# Patient Record
Sex: Female | Born: 1945 | Race: White | Hispanic: No | Marital: Married | Smoking: Never smoker
Health system: Southern US, Community
[De-identification: ages and names within clinical notes are randomized; demographics above are authoritative.]

## PROBLEM LIST (undated history)

## (undated) DIAGNOSIS — B019 Varicella without complication: Secondary | ICD-10-CM

## (undated) DIAGNOSIS — E785 Hyperlipidemia, unspecified: Secondary | ICD-10-CM

## (undated) DIAGNOSIS — R7303 Prediabetes: Secondary | ICD-10-CM

## (undated) DIAGNOSIS — H3530 Unspecified macular degeneration: Secondary | ICD-10-CM

## (undated) DIAGNOSIS — A630 Anogenital (venereal) warts: Secondary | ICD-10-CM

## (undated) DIAGNOSIS — F419 Anxiety disorder, unspecified: Secondary | ICD-10-CM

## (undated) DIAGNOSIS — R42 Dizziness and giddiness: Secondary | ICD-10-CM

## (undated) DIAGNOSIS — M797 Fibromyalgia: Secondary | ICD-10-CM

## (undated) DIAGNOSIS — I679 Cerebrovascular disease, unspecified: Secondary | ICD-10-CM

## (undated) DIAGNOSIS — Z800 Family history of malignant neoplasm of digestive organs: Secondary | ICD-10-CM

## (undated) HISTORY — DX: Anogenital (venereal) warts: A63.0

## (undated) HISTORY — PX: COLONOSCOPY: SHX174

## (undated) HISTORY — DX: Unspecified macular degeneration: H35.30

## (undated) HISTORY — DX: Varicella without complication: B01.9

## (undated) HISTORY — DX: Fibromyalgia: M79.7

## (undated) HISTORY — DX: Hyperlipidemia, unspecified: E78.5

## (undated) HISTORY — DX: Family history of malignant neoplasm of digestive organs: Z80.0

---
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
## 1977-07-02 HISTORY — PX: APPENDECTOMY: SHX54

## 1977-07-02 HISTORY — PX: CHOLECYSTECTOMY: SHX55

## 1999-03-30 ENCOUNTER — Other Ambulatory Visit: Admission: RE | Admit: 1999-03-30 | Discharge: 1999-03-30 | Payer: Self-pay | Admitting: Gynecology

## 2000-12-19 ENCOUNTER — Ambulatory Visit (HOSPITAL_COMMUNITY): Admission: RE | Admit: 2000-12-19 | Discharge: 2000-12-19 | Payer: Self-pay | Admitting: *Deleted

## 2001-01-27 ENCOUNTER — Ambulatory Visit (HOSPITAL_COMMUNITY): Admission: RE | Admit: 2001-01-27 | Discharge: 2001-01-27 | Payer: Self-pay | Admitting: Gastroenterology

## 2001-04-17 ENCOUNTER — Other Ambulatory Visit: Admission: RE | Admit: 2001-04-17 | Discharge: 2001-04-17 | Payer: Self-pay | Admitting: Gynecology

## 2002-04-21 ENCOUNTER — Other Ambulatory Visit: Admission: RE | Admit: 2002-04-21 | Discharge: 2002-04-21 | Payer: Self-pay | Admitting: Gynecology

## 2004-02-24 ENCOUNTER — Other Ambulatory Visit: Admission: RE | Admit: 2004-02-24 | Discharge: 2004-02-24 | Payer: Self-pay | Admitting: Gynecology

## 2004-06-12 ENCOUNTER — Encounter: Admission: RE | Admit: 2004-06-12 | Discharge: 2004-06-12 | Payer: Self-pay | Admitting: Orthopedic Surgery

## 2004-11-21 ENCOUNTER — Emergency Department (HOSPITAL_COMMUNITY): Admission: EM | Admit: 2004-11-21 | Discharge: 2004-11-21 | Payer: Self-pay | Admitting: Emergency Medicine

## 2005-04-16 ENCOUNTER — Other Ambulatory Visit: Admission: RE | Admit: 2005-04-16 | Discharge: 2005-04-16 | Payer: Self-pay | Admitting: Gynecology

## 2005-07-24 ENCOUNTER — Encounter: Admission: RE | Admit: 2005-07-24 | Discharge: 2005-10-22 | Payer: Self-pay | Admitting: Family Medicine

## 2006-01-17 ENCOUNTER — Observation Stay (HOSPITAL_COMMUNITY): Admission: EM | Admit: 2006-01-17 | Discharge: 2006-01-18 | Payer: Self-pay | Admitting: Emergency Medicine

## 2006-09-09 ENCOUNTER — Emergency Department (HOSPITAL_COMMUNITY): Admission: EM | Admit: 2006-09-09 | Discharge: 2006-09-10 | Payer: Self-pay | Admitting: Emergency Medicine

## 2009-06-30 ENCOUNTER — Ambulatory Visit: Payer: Self-pay | Admitting: Endocrinology

## 2010-04-11 ENCOUNTER — Emergency Department (HOSPITAL_COMMUNITY): Admission: EM | Admit: 2010-04-11 | Discharge: 2010-04-11 | Payer: Self-pay | Admitting: Emergency Medicine

## 2010-04-12 ENCOUNTER — Emergency Department (HOSPITAL_COMMUNITY): Admission: EM | Admit: 2010-04-12 | Discharge: 2010-04-12 | Payer: Self-pay | Admitting: Emergency Medicine

## 2010-09-14 LAB — GLUCOSE, CAPILLARY: Glucose-Capillary: 111 mg/dL — ABNORMAL HIGH (ref 70–99)

## 2010-11-17 NOTE — Procedures (Signed)
[facility]  Patient:    Debra Atkinson, Debra Atkinson                      MRN: [id number] Proc. Date: 01/27/01 Attending:  Everardo All. Madilyn Fireman, M.D. CC:         Kerry Kass, M.D. [facility]   Procedure Report  PROCEDURE:  Colonoscopy.  INDICATION FOR PROCEDURE:  Intermittent rectal bleeding in a 65 year old patient with no prior colon screening.  DESCRIPTION OF PROCEDURE:  The patient was placed in the left lateral decubitus position and placed on the pulse monitor with continuous low-flow oxygen delivered by nasal cannula.  She was sedated with 100 mg IV Demerol and 10 mg IV Versed.  The Olympus video colonoscope was inserted into the rectum and advanced to the cecum, confirmed by transillumination at McBurneys point and visualization of the ileocecal valve and appendiceal orifice.  The prep was excellent.  The cecum, ascending, transverse, descending, and sigmoid colon all appeared normal with no masses, polyps, diverticula, or other mucosal abnormalities.  The rectum likewise appeared normal, and retroflex view of the anus revealed small internal hemorrhoids.  The colonoscope was then withdrawn, and the patient returned to the recovery room in stable condition.  She tolerated the procedure well, and there were no immediate complications.  IMPRESSION:  Internal hemorrhoids, otherwise normal colonoscopy. DD:  01/27/01 TD:  01/27/01 Job: [id number]

## 2010-11-17 NOTE — H&P (Signed)
NAME:  Debra Atkinson, Debra Atkinson              ACCOUNT NO.:  [id number]   MEDICAL RECORD NO.:  [id number]          PATIENT TYPE:  INP   LOCATION:  6529                         FACILITY:  [facility]   PHYSICIAN:  Melissa L. Ladona Ridgel, MD  DATE OF BIRTH:  1945-11-02   DATE OF ADMISSION:  01/17/2006  DATE OF DISCHARGE:                                HISTORY & PHYSICAL   CHIEF COMPLAINT:  Chest pain.   PRIMARY CARE PHYSICIAN:  Dr. Theresia Lo.   HISTORY OF PRESENT ILLNESS:  The patient is a 65 year old white female with  a past medical history for increased cholesterol, for which she is on no  medications, and possible hypertension, for which she is not medicated.  She  also has a history of anxiety and potential early diabetes.  The patient  presented to the urgent care setting after leaving work early with  complaints of dizziness, shortness of breath, inability to catch her breath  as well as chest pressure.  The patient states that she went to work  yesterday and the air-conditioning was broken and therefore her work  environment became quite hot and closed.  She states that first she noticed  that her nose became stuffy and she could not take a deep breath and then  she became more and more short of breath with increased heaviness in her  chest.  She noted dizziness and trembling and was taken to an air-  conditioned room until she felt a little bit better.  She states her  symptoms improved, although she still felt like her nose was stopped up and  elected to come home.  On the way home, she decided that she still was  having chest heaviness and so she went to the urgent care center for further  evaluation.  There, she was noted to have a minor EKG change and therefore  was sent to the emergency room for further evaluation.  In the emergency  room, the patient was noted to have negative point-of-care enzymes.  Her EKG  appeared slightly different with an ST elevation in 2 leads, but no  significant  acute changes.  She had no worsening symptoms while in the  emergency room, but still felt stuffy.  The patient states she did take a  Claritin to try to help with the stuffiness, but this did not work.  In the  urgent care setting, she was treated with sublingual nitroglycerin and an  aspirin 81 mg.   REVIEW OF SYSTEMS:  No cough no sputum.  Positive stuffy nose.  No fever.  No chills.  Positive nausea, but no vomiting.  No current chest pain or  pressure.  All other review of systems are negative.   PAST MEDICAL HISTORY:  1.  Fibromyalgia.  2.  Anxiety.  3.  Early diabetes.  4.  Increased cholesterol.  5.  Hypertension.   PAST SURGICAL HISTORY:  1.  She had a C-section in 1978.  2.  Her gallbladder and appendix were removed in 1979.   ALLERGIES:  No known drug allergies.   MEDICATIONS:  Her medications currently are only p.r.n.  Claritin.   SOCIAL HISTORY:  She does not smoke.  She does not drink.  She is a Public librarian.   FAMILY HISTORY:  Mom is living with breast cancer, hypertension and ocular  problems like glaucoma.  Dad is deceased with coronary artery disease and  diverticular disease.   PHYSICAL EXAMINATION:  VITAL SIGNS:  Temperature is 97.3, blood pressure  117/66, pulse is 69, respirations are 13 and saturations are 99%.  GENERAL:  This is a well-developed, well-nourished white female in no acute  distress.  HEENT:  She is normocephalic, atraumatic.  Pupils are equal, round and  reactive to light.  Extraocular muscles are intact.  Mucous membranes are  moist.  NECK:  Supple.  There is no JVD, no lymph nodes and no carotid bruits.  CHEST:  Clear to auscultation.  There are no rales, rhonchi or wheezes.  CARDIOVASCULAR:  Regular rate and rhythm.  Positive S1 and S2.  No S3 or S4.  No murmurs, rubs, or gallops.  ABDOMEN:  Soft, nontender and non-distended with positive bowel sounds.  EXTREMITIES:  No clubbing, cyanosis or edema.  NEUROLOGIC:   Nonfocal.  Cranial nerves II-XII are intact.  Power is 5/5.  DTRs are 2+.   LABORATORY DATA:  Sodium is 141, potassium 3.6, chloride 109, CO2 is 25, BUN  is 11, creatinine 0.8.  Point-of-care enzymes are negative x2.   Her EKG showed 76 beats per minute with no ST-T wave changes initially,  although another EKG shows a mild ST elevation in lead V5 and V6.   RADIOLOGIC FINDINGS:  X-ray shows no acute pulmonary disease.   ASSESSMENT AND PLAN:  This is a 65 year old white female who became short of  breath at work in the context of having no air-conditioning.  The patient  described chest heaviness which persisted slightly after leaving work.  She  presented to the urgent care center and was sent to the emergency department  for further evaluation.  The patient does appear to have significant nasal  congestion and in the context of being overheated and anxious, I think that  this likely is related to those symptoms instead of a primary cardiac issue.  We will admit her as an observation and rule out myocardial infarction with  serial cardiac markers.  The patient may be eligible for outpatient testing  and therefore I will recommend early followup with potential for discharge  if appropriate.   1.  Cardiovascular:  Serial cardiac markers.  We will start her on aspirin.      I do not feel strongly about putting her on Lovenox at this time, as I      do not believe that her symptoms are related to a cardiac issue.  2.  Pulmonary:  I will go ahead and order some Flonase and consider nasal      saline if appropriate.  I do not believe she needs the oxygen therapy.  3.  Gastrointestinal:  We will go ahead and feed the patient.  At this time,      there is no other treatment necessary.  4.  Genitourinary:  She has no complaints.  5.  Endocrine:  She has a history of diabetes.  We will check a hemoglobin      A1c and a lipid panel.     Melissa L. Ladona Ridgel, MD  Electronically Signed      MLT/MEDQ  D:  01/18/2006  T:  01/18/2006  Job:  [id number]  cc:   Vikki Ports, M.D.  Fax: [phone]

## 2011-04-25 ENCOUNTER — Other Ambulatory Visit (INDEPENDENT_AMBULATORY_CARE_PROVIDER_SITE_OTHER): Payer: Medicare PPO

## 2011-04-25 ENCOUNTER — Other Ambulatory Visit: Payer: Self-pay | Admitting: Internal Medicine

## 2011-04-25 ENCOUNTER — Ambulatory Visit (INDEPENDENT_AMBULATORY_CARE_PROVIDER_SITE_OTHER): Payer: Medicare PPO | Admitting: Internal Medicine

## 2011-04-25 ENCOUNTER — Encounter: Payer: Self-pay | Admitting: Internal Medicine

## 2011-04-25 DIAGNOSIS — E119 Type 2 diabetes mellitus without complications: Secondary | ICD-10-CM

## 2011-04-25 DIAGNOSIS — IMO0002 Reserved for concepts with insufficient information to code with codable children: Secondary | ICD-10-CM

## 2011-04-25 DIAGNOSIS — R7303 Prediabetes: Secondary | ICD-10-CM | POA: Insufficient documentation

## 2011-04-25 DIAGNOSIS — E785 Hyperlipidemia, unspecified: Secondary | ICD-10-CM

## 2011-04-25 DIAGNOSIS — S39012A Strain of muscle, fascia and tendon of lower back, initial encounter: Secondary | ICD-10-CM

## 2011-04-25 LAB — CBC WITH DIFFERENTIAL/PLATELET
Basophils Absolute: 0 10*3/uL (ref 0.0–0.1)
Basophils Relative: 0.4 % (ref 0.0–3.0)
Eosinophils Relative: 1.3 % (ref 0.0–5.0)
HCT: 38.7 % (ref 36.0–46.0)
Hemoglobin: 13 g/dL (ref 12.0–15.0)
Lymphocytes Relative: 41.3 % (ref 12.0–46.0)
Lymphs Abs: 2.7 10*3/uL (ref 0.7–4.0)
MCHC: 33.5 g/dL (ref 30.0–36.0)
MCV: 84.2 fl (ref 78.0–100.0)
Monocytes Relative: 7.4 % (ref 3.0–12.0)
Neutro Abs: 3.3 10*3/uL (ref 1.4–7.7)
Neutrophils Relative %: 49.6 % (ref 43.0–77.0)
RBC: 4.6 Mil/uL (ref 3.87–5.11)
RDW: 13.8 % (ref 11.5–14.6)
WBC: 6.6 10*3/uL (ref 4.5–10.5)

## 2011-04-25 LAB — LDL CHOLESTEROL, DIRECT: Direct LDL: 216 mg/dL

## 2011-04-25 LAB — HEPATIC FUNCTION PANEL
ALT: 25 U/L (ref 0–35)
AST: 16 U/L (ref 0–37)
Albumin: 3.8 g/dL (ref 3.5–5.2)
Alkaline Phosphatase: 90 U/L (ref 39–117)
Total Bilirubin: 0.7 mg/dL (ref 0.3–1.2)
Total Protein: 7.3 g/dL (ref 6.0–8.3)

## 2011-04-25 LAB — LIPID PANEL
Cholesterol: 280 mg/dL — ABNORMAL HIGH (ref 0–200)
HDL: 49.7 mg/dL (ref >39.00)
Total CHOL/HDL Ratio: 6
VLDL: 32.4 mg/dL (ref 0.0–40.0)

## 2011-04-25 LAB — COMPREHENSIVE METABOLIC PANEL
ALT: 25 U/L (ref 0–35)
AST: 16 U/L (ref 0–37)
Albumin: 3.8 g/dL (ref 3.5–5.2)
Alkaline Phosphatase: 90 U/L (ref 39–117)
BUN: 15 mg/dL (ref 6–23)
Calcium: 9.4 mg/dL (ref 8.4–10.5)
Chloride: 107 mEq/L (ref 96–112)
Glucose, Bld: 105 mg/dL — ABNORMAL HIGH (ref 70–99)
Potassium: 4.7 mEq/L (ref 3.5–5.1)
Sodium: 143 mEq/L (ref 135–145)
Total Bilirubin: 0.7 mg/dL (ref 0.3–1.2)
Total Protein: 7.3 g/dL (ref 6.0–8.3)

## 2011-04-25 LAB — HEMOGLOBIN A1C: Hgb A1c MFr Bld: 6.5 % (ref 4.6–6.5)

## 2011-04-25 MED ORDER — CYCLOBENZAPRINE HCL 5 MG PO TABS: 5.0000 mg | ORAL_TABLET | Freq: Three times a day (TID) | ORAL | Status: AC | PRN

## 2011-04-25 NOTE — Assessment & Plan Note (Signed)
Poor control of lipids with an LDL that is the medical treatment zone: for a patient with glucose metabolism derangement (diabetes) the goal is an LDL of 100 or less.  Plan - a trial of STRICT low fat diet coupled with aerobic exercise - 30 minutes of exercise with a heart rate of 120+ 3 times a week.           Repeat lipid panel in 6 months - if LDL remains greater than 100 will recommend medical therapy with a "statin" drug.
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
## 2011-04-25 NOTE — Patient Instructions (Signed)
Back pain - minor muscle strain. Plan - heat patch for comfort, muscle relaxant (flexeril) 5 mg every 8 hours, ibuprofen as needed. Be sure to do range of motion for 10 mintues twice a day.  Lab - will check cholesterol, blood sugar and routine labs.  Will include lab results in the note that I will send you.

## 2011-04-25 NOTE — Assessment & Plan Note (Signed)
Patient reports that she has not been on medication but does try to watch her diet. Lab reveals a A1C of 6.5% which is above the threshold for diabetes but below the theshold for medical therapy. However, she does have the accompanying increased risk for cardiovascular disease and other target organ damage.  Plan - risk reduction: no sugar, low carb diet; reduce LDL cholesterol to 100 or less; continue to monitor BP; exercise.

## 2011-04-26 ENCOUNTER — Other Ambulatory Visit (HOSPITAL_COMMUNITY)
Admission: RE | Admit: 2011-04-26 | Discharge: 2011-04-26 | Disposition: A | Discharge status: Home / Self Care | Payer: Medicare PPO | Source: Ambulatory Visit | Attending: Obstetrics and Gynecology | Admitting: Obstetrics and Gynecology

## 2011-04-26 ENCOUNTER — Encounter: Payer: Self-pay | Admitting: Internal Medicine

## 2011-04-26 DIAGNOSIS — Z1159 Encounter for screening for other viral diseases: Secondary | ICD-10-CM | POA: Insufficient documentation

## 2011-04-26 DIAGNOSIS — Z01419 Encounter for gynecological examination (general) (routine) without abnormal findings: Secondary | ICD-10-CM | POA: Insufficient documentation

## 2011-04-30 ENCOUNTER — Telehealth: Payer: Self-pay | Admitting: *Deleted

## 2011-04-30 MED ORDER — PNEUMOCOCCAL VAC POLYVALENT 25 MCG/0.5ML IJ INJ: 0.5000 mL | INJECTION | Freq: Once | INTRAMUSCULAR | Status: DC

## 2011-04-30 NOTE — Telephone Encounter (Signed)
Pt needed rx for pnuemonia vaccine.

## 2011-05-02 ENCOUNTER — Telehealth: Payer: Self-pay | Admitting: Internal Medicine

## 2011-05-02 NOTE — Telephone Encounter (Signed)
Received 3pgs from [facility]. Forwarded to Dr. Debby Bud for review. 05/02/11-AR

## 2011-05-04 ENCOUNTER — Ambulatory Visit (INDEPENDENT_AMBULATORY_CARE_PROVIDER_SITE_OTHER): Payer: Medicare PPO

## 2011-05-04 DIAGNOSIS — Z23 Encounter for immunization: Secondary | ICD-10-CM

## 2011-05-24 ENCOUNTER — Other Ambulatory Visit: Payer: Self-pay

## 2011-05-24 ENCOUNTER — Emergency Department (HOSPITAL_COMMUNITY)
Admission: EM | Admit: 2011-05-24 | Discharge: 2011-05-24 | Disposition: A | Discharge status: Home / Self Care | Payer: Medicare PPO | Attending: Emergency Medicine | Admitting: Emergency Medicine

## 2011-05-24 ENCOUNTER — Encounter (HOSPITAL_COMMUNITY): Payer: Self-pay | Admitting: Emergency Medicine

## 2011-05-24 ENCOUNTER — Emergency Department (HOSPITAL_COMMUNITY): Payer: Medicare PPO

## 2011-05-24 DIAGNOSIS — R079 Chest pain, unspecified: Secondary | ICD-10-CM | POA: Insufficient documentation

## 2011-05-24 DIAGNOSIS — M79605 Pain in left leg: Secondary | ICD-10-CM

## 2011-05-24 DIAGNOSIS — R110 Nausea: Secondary | ICD-10-CM | POA: Insufficient documentation

## 2011-05-24 DIAGNOSIS — R61 Generalized hyperhidrosis: Secondary | ICD-10-CM | POA: Insufficient documentation

## 2011-05-24 DIAGNOSIS — R05 Cough: Secondary | ICD-10-CM | POA: Insufficient documentation

## 2011-05-24 DIAGNOSIS — E785 Hyperlipidemia, unspecified: Secondary | ICD-10-CM | POA: Insufficient documentation

## 2011-05-24 DIAGNOSIS — M79609 Pain in unspecified limb: Secondary | ICD-10-CM | POA: Insufficient documentation

## 2011-05-24 DIAGNOSIS — R059 Cough, unspecified: Secondary | ICD-10-CM | POA: Insufficient documentation

## 2011-05-24 LAB — CBC
HCT: 38.6 % (ref 36.0–46.0)
Hemoglobin: 12.8 g/dL (ref 12.0–15.0)
MCHC: 33.2 g/dL (ref 30.0–36.0)
MCV: 83.9 fL (ref 78.0–100.0)
RDW: 13.5 % (ref 11.5–15.5)

## 2011-05-24 LAB — DIFFERENTIAL
Basophils Absolute: 0 10*3/uL (ref 0.0–0.1)
Eosinophils Relative: 1 % (ref 0–5)
Lymphocytes Relative: 33 % (ref 12–46)
Lymphs Abs: 2.7 10*3/uL (ref 0.7–4.0)
Monocytes Absolute: 0.9 10*3/uL (ref 0.1–1.0)

## 2011-05-24 LAB — PROTIME-INR: Prothrombin Time: 12.6 seconds (ref 11.6–15.2)

## 2011-05-24 LAB — BASIC METABOLIC PANEL
CO2: 25 mEq/L (ref 19–32)
Calcium: 9.5 mg/dL (ref 8.4–10.5)
Chloride: 103 mEq/L (ref 96–112)
Potassium: 3.6 mEq/L (ref 3.5–5.1)

## 2011-05-24 LAB — APTT: aPTT: 29 seconds (ref 24–37)

## 2011-05-24 LAB — POCT I-STAT TROPONIN I: Troponin i, poc: 0 ng/mL (ref 0.00–0.08)

## 2011-05-24 MED ORDER — ASPIRIN 81 MG PO CHEW
324.0000 mg | CHEWABLE_TABLET | Freq: Once | ORAL | Status: AC
  Administered 2011-05-24: 324 mg via ORAL

## 2011-05-24 MED ORDER — IOHEXOL 300 MG/ML  SOLN
100.0000 mL | Freq: Once | INTRAMUSCULAR | Status: AC | PRN
  Administered 2011-05-24: 100 mL via INTRAVENOUS

## 2011-05-24 MED ORDER — SODIUM CHLORIDE 0.9 % IV SOLN
Freq: Once | INTRAVENOUS | Status: AC
  Administered 2011-05-24: 09:00:00 via INTRAVENOUS

## 2011-05-24 NOTE — ED Notes (Signed)
Pt had left hand IV in when I arrived this am.  This was placed by bobby brooks

## 2011-05-24 NOTE — ED Provider Notes (Signed)
History     CSN: [id number] Arrival date & time: 05/24/2011  5:25 AM   First MD Initiated Contact with Patient 05/24/11 0542      Chief Complaint  Patient presents with  . Chest Pain    (Consider location/radiation/quality/duration/timing/severity/associated sxs/prior treatment) HPI Comments: 65 year old female with a history of diet-controlled diabetes and hyperlipidemia who is a nonsmoker who presents after having mild to moderate chest heaviness. She states that she awoke several hours ago with a pain in her left proximal leg which was severe, constant, nothing made better or worse. This is gradually improved but 2 hours ago developed some pain which she describes as a pulling pain in her chest which radiated to her back, this was associated with nausea, diaphoresis. Symptoms have essentially resolved at this point other than the intermittent left proximal leg pain. She has been admitted in the past with chest pain but has not had a stress test or heart catheterization per her report. When she was admitted in 2007 she had serial cardiac markers which were negative for ischemia. She denies swelling in the legs, trauma, travel, immobilization, fever, smoking, oral contraceptives  Patient is a 65 y.o. female presenting with chest pain. The history is provided by the patient and medical records.  Chest Pain The chest pain began 1 - 2 hours ago. Duration of episode(s) is 2 hours. Chest pain occurs intermittently. The chest pain is resolved. Associated with: nothing. The pain is currently at 0/10. The quality of the pain is described as heavy (pulling). The pain radiates to the mid back. Exacerbated by: nothing. Primary symptoms include cough ( mild cough - from nasal drainage) and nausea. Pertinent negatives for primary symptoms include no fever, no fatigue, no syncope, no shortness of breath, no wheezing, no palpitations, no abdominal pain, no vomiting, no dizziness and no altered mental status.  Primary symptoms comment: diaphoresis     Past Medical History  Diagnosis Date  . Varicella   . Diabetes mellitus     diet management  . Genital warts     treated podophyllin  . Hyperlipidemia     diet managed  . Concussion '06, '11    Past Surgical History  Procedure Date  . Cholecystectomy 1979    laparotomy  . Appendectomy 1979  . Cesarean section 1978    Family History  Problem Relation Age of Onset  . Diabetes Mother   . Heart disease Mother     CHF  . Cancer Mother 30    colon cancer  . COPD Mother   . Heart disease Father     CAD/MI  . Rheumatic fever Father     History  Substance Use Topics  . Smoking status: Never Smoker   . Smokeless tobacco: Never Used  . Alcohol Use: No    OB History    Grav Para Term Preterm Abortions TAB SAB Ect Mult Living                  Review of Systems  Constitutional: Negative for fever and fatigue.  Respiratory: Positive for cough ( mild cough - from nasal drainage). Negative for shortness of breath and wheezing.   Cardiovascular: Positive for chest pain. Negative for palpitations and syncope.  Gastrointestinal: Positive for nausea. Negative for vomiting and abdominal pain.  Neurological: Negative for dizziness.  Psychiatric/Behavioral: Negative for altered mental status.  All other systems reviewed and are negative.    Allergies  Review of patient's allergies indicates no known allergies.  Home Medications   Current Outpatient Rx  Name Route Sig Dispense Refill  . CYCLOBENZAPRINE HCL 5 MG PO TABS Oral Take 1 tablet (5 mg total) by mouth every 8 (eight) hours as needed for muscle spasms. 30 tablet 1  . PNEUMOCOCCAL VAC POLYVALENT 25 MCG/0.5ML IJ INJ Intramuscular Inject 0.5 mLs into the muscle once. 0.5 mL 0    BP 131/65  Temp(Src) 98.8 F (37.1 C) (Oral)  Resp 14  SpO2 97%  Physical Exam  Nursing note and vitals reviewed. Constitutional: She appears well-developed and well-nourished. No distress.    HENT:  Head: Normocephalic and atraumatic.  Mouth/Throat: Oropharynx is clear and moist. No oropharyngeal exudate.  Eyes: Conjunctivae and EOM are normal. Pupils are equal, round, and reactive to light. Right eye exhibits no discharge. Left eye exhibits no discharge. No scleral icterus.  Neck: Normal range of motion. Neck supple. No JVD present. No thyromegaly present.  Cardiovascular: Normal rate, regular rhythm, normal heart sounds and intact distal pulses.  Exam reveals no gallop and no friction rub.   No murmur heard. Pulmonary/Chest: Effort normal and breath sounds normal. No respiratory distress. She has no wheezes. She has no rales. She exhibits no tenderness.  Abdominal: Soft. Bowel sounds are normal. She exhibits no distension and no mass. There is no tenderness.  Musculoskeletal: Normal range of motion. She exhibits tenderness ( Tenderness of the proximal medial left thigh, no mass or rash felt). She exhibits no edema.  Lymphadenopathy:    She has no cervical adenopathy.  Neurological: She is alert. Coordination normal.  Skin: Skin is warm and dry. No rash noted. No erythema.  Psychiatric: She has a normal mood and affect. Her behavior is normal.    ED Course  Procedures (including critical care time)   Labs Reviewed  CBC  BASIC METABOLIC PANEL  I-STAT TROPONIN I  I-STAT TROPONIN I  DIFFERENTIAL  APTT  PROTIME-INR  D-DIMER, QUANTITATIVE   Dg Chest 2 View  05/24/2011  *RADIOLOGY REPORT*  Clinical Data: Chest pain/soreness.  CHEST - 2 VIEW  Comparison: 09/10/2006 CT  Findings: Lungs are clear. No pleural effusion or pneumothorax. The cardiomediastinal contours are within normal limits. The visualized bones and soft tissues are without significant appreciable abnormality. Surgical clips right upper quadrant.  IMPRESSION: No acute cardiopulmonary process.  Original Report Authenticated By: Waneta Martins, M.D.     No diagnosis found.    MDM  Well-appearing female  with normal EKG, normal vital signs, labs pending to rule out DVT as well as cardiac cause of chest pain. Would benefit from chest pain protocol if troponin negative  ED ECG REPORT   Date: 05/24/2011   Rate: 80  Rhythm: normal sinus rhythm  QRS Axis: normal  Intervals: normal  ST/T Wave abnormalities: normal  Conduction Disutrbances:none  Narrative Interpretation:   Old EKG Reviewed: none available    Blood work unremarkable other than d-dimer which is borderline high.  Chest x-ray without acute findings, CBC normal, electrolytes normal, coagulation panel normal, troponin negative. I have discussed with patient regarding further need for testing for her leg pain and chest pain and has agreed that a CT angiogram is appropriate to rule out pulmonary embolism. As this does not rule out DVT as a source of pain in her leg I have also ordered an ultrasound vascular study for her leg.  Change of shift, care signed out to Dr. Arbie Cookey, MD 05/24/11 [phone]

## 2011-05-24 NOTE — ED Notes (Signed)
Vital signs stable. 

## 2011-05-24 NOTE — ED Notes (Signed)
PT. REPORTS SUBSTERNAL CHEST PAIN /HEAVINESS THIS MORNING AND UPPER BACK PAIN / LEFT THIGH PAIN ,  NO SOB , PRODUCTIVE COUGH , NAUSEA- NO VOMITTING WITH DIAPHORESIS.

## 2011-05-24 NOTE — ED Notes (Signed)
Gave pt coffee and snacks

## 2011-05-24 NOTE — ED Notes (Signed)
Secretary notified that doppler is ordered, she called tech

## 2011-05-24 NOTE — ED Notes (Signed)
Pt undressed and placed on cardiac monitor, bp cuff, and pulse ox.  

## 2011-05-24 NOTE — ED Notes (Signed)
MD at bedside. Explaining POC. Pt continues to feel minimal pressure in left posterior knee area

## 2011-05-24 NOTE — ED Provider Notes (Signed)
         Progress Notes signed by Mila Homer at 05/24/11 [phone]     Author:  Mila Homer  Service:  (none)  Author Type:  Sonographer   Filed:  05/24/11 0841  Note Time:  05/24/11 0839          *PRELIMINARY RESULTS*  Left Lower Extremity Venous has been performed.  No obvious evidence of a deep vein thrombosis involving the left lower extremity. No obvious evidence of a Baker's Cyst on the left.  Mila Homer  05/24/2011, 8:39 AM      Reevaluation: Patient interviewed, she gives me a slightly different history. She was awakened by left upper leg cramping that got suddenly worse and was associated with a generalized distress, including anxiety, shortness of breath clamminess, and weakness. She took an aspirin and magnesium tablet. The left leg discomfort, and a generalized symptoms resolve after 15 or 20 minutes. She was concerned that there might be a serious problem, so decided to come to the emergency department. While traveling to the emergency department by car. She developed a heaviness in her chest that was bilateral and associated with a cold feeling in her breasts. The symptoms in the chest lasted 15-20 minutes and resolved. She has had some recurrence of similar discomfort several times while at rest in the ER. Additionally, she complains of a similar discomfort in her chest that comes and goes for the last 2 years and feels like a cramp. It resolves spontaneously typically. She also has cramps in her bilateral legs that come and go for several years.  Evaluation in the emergency department today is negative. There is no evidence for left leg DVT or pulmonary embolus. Screening tests for cardiac infarct are negative today.  Her calculated heart score and TIMI scores are both in the 5% range. Her chest pain today is atypical for cardiac disease. It has improved spontaneously.  Since the patient's chest pain was secondary in time after her  moderate left upper leg tenderness; I think that this is very unlikely to be a cardiac syndrome. She is a low risk cardiac profile patient. She is stable for discharge with outpatient management and followup with her PCP in one week's time  Discharge Dx: 1. Leg Pain 2. Nonspecific Chest Pain  Flint Melter, MD 05/24/11 1103

## 2011-05-24 NOTE — Progress Notes (Signed)
*  PRELIMINARY RESULTS*  Left Lower Extremity Venous  has been performed.  No obvious evidence of a deep vein thrombosis involving the left lower extremity. No obvious evidence of a Baker's Cyst on the left.    Debra Atkinson 05/24/2011, 8:39 AM

## 2011-05-25 ENCOUNTER — Telehealth: Payer: Self-pay

## 2011-05-25 NOTE — Telephone Encounter (Signed)
[facility] Triage Call Report Triage Record Num: [id number] Operator: Craig Guess Patient Name: Tinea Nobile Call Date & Time: 05/24/2011 4:33:01AM Patient Phone: [phone] PCP: Illene Regulus Patient Gender: Female PCP Fax : [phone] Patient DOB: 04/13/1946 Practice Name: [facility] Reason for Call: Caller: Harris/Patient; PCP: Illene Regulus; CB#: [phone]; Call Reason: Leg Pain and Chills; Sx Onset: 05/24/2011; Sx Notes: Caller reports she woke at 4am with severe pain in the upper thigh on the left. No redness or swelling. Caller reports pain has subsided, but she is now shaking and has chills. Tightness in center of chest. Afebrile; Wt: ; Home treatment(s) tried: Asa, Mg; Did home treatment help?: No; Guideline Used: Chest Pain; Disp: Caller advised to call 911 for transport to the ED for reported chest tightness. Caller will have her husband drive her, Caller advised she should call 911 for transport. She will go to [facility]. Protocol(s) Used: Chest Pain Recommended Outcome per Protocol: Activate EMS 911 Reason for Outcome: Pressure, fullness, squeezing sensation or pain anywhere in the chest lasting 5 or more minutes now or within the last hour. Pain is NOT associated with taking a deep breath or a productive cough, movement, or touch to a localized area on the chest. Care Advice: ~ 05/24/2011 4:42:45AM Page 1 of 1 [id number]

## 2011-08-09 ENCOUNTER — Telehealth: Payer: Self-pay | Admitting: *Deleted

## 2011-08-09 DIAGNOSIS — Z1211 Encounter for screening for malignant neoplasm of colon: Secondary | ICD-10-CM

## 2011-08-09 NOTE — Telephone Encounter (Signed)
msg sent to [facility]

## 2011-08-09 NOTE — Telephone Encounter (Signed)
Pt requesting referral to have colonoscopy: done w/Dr Madilyn Fireman at [facility] prior; inquire as to if you want her to go back to [facility] or use [facility].?

## 2012-03-17 ENCOUNTER — Encounter: Payer: Self-pay | Admitting: Internal Medicine

## 2012-04-02 ENCOUNTER — Telehealth: Payer: Self-pay | Admitting: Internal Medicine

## 2012-04-02 NOTE — Telephone Encounter (Signed)
Caller: Kashae/Patient; Patient Name: Debra Atkinson; PCP: Illene Regulus (Adults only); Best Callback Phone Number: [phone]; Reason for call: Rash/Hives. Are itchy. Located to face, below ears, by chin, neck, forehead. Was outside mowing grass yesterday, 04/01/12. States that throat feels tight. Advised to be seen within 24 hours per guidelines. Advised Benadryl per package dosing instrucions. Appoitnment scheduled for 04/03/12 with Dr. Debby Bud at 11:00AM with Dr. Debby Bud.

## 2012-04-03 ENCOUNTER — Ambulatory Visit (INDEPENDENT_AMBULATORY_CARE_PROVIDER_SITE_OTHER): Payer: Medicare PPO | Admitting: Internal Medicine

## 2012-04-03 ENCOUNTER — Encounter: Payer: Self-pay | Admitting: Internal Medicine

## 2012-04-03 VITALS — BP 120/88 | HR 76 | Temp 98.1°F | Resp 12 | Wt 206.0 lb

## 2012-04-03 DIAGNOSIS — R071 Chest pain on breathing: Secondary | ICD-10-CM

## 2012-04-03 DIAGNOSIS — R0789 Other chest pain: Secondary | ICD-10-CM

## 2012-04-03 DIAGNOSIS — T7840XA Allergy, unspecified, initial encounter: Secondary | ICD-10-CM

## 2012-04-03 MED ORDER — PREDNISONE 10 MG PO TABS: 10.0000 mg | ORAL_TABLET | Freq: Every day | ORAL | Status: DC
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
## 2012-04-03 NOTE — Patient Instructions (Addendum)
1. Allergic reaction - not sure what the stimulus was for the outbreak. Breathing is normal, no throat swelling on exam Plan - prednisone 30 mg x 1 day, 20 mg daily x 3 days, 10 mg daily x 3 days  2. Chest wall pain - the sharp "catch" sounds musculoskeletal. The symptoms are not cardiac in nature. Plan - gentle stretching exercises on a regular (daily ) basis.   3. Sinus congestion - may be chronic allergy. Plan - low dose sudafed - 30 mg - twice a day for congestion  Benadryl or over the counter antihistamine, e.g. Allegra for runny nose and post nasal drainage. Allergic Rhinitis Allergic rhinitis is when the mucous membranes in the nose respond to allergens. Allergens are particles in the air that cause your body to have an allergic reaction. This causes you to release allergic antibodies. Through a chain of events, these eventually cause you to release histamine into the blood stream (hence the use of antihistamines). Although meant to be protective to the body, it is this release that causes your discomfort, such as frequent sneezing, congestion and an itchy runny nose.   CAUSES   The pollen allergens may come from grasses, trees, and weeds. This is seasonal allergic rhinitis, or "hay fever." Other allergens cause year-round allergic rhinitis (perennial allergic rhinitis) such as house dust mite allergen, pet dander and mold spores.   SYMPTOMS    Nasal stuffiness (congestion).   Runny, itchy nose with sneezing and tearing of the eyes.   There is often an itching of the mouth, eyes and ears.  It cannot be cured, but it can be controlled with medications. DIAGNOSIS   If you are unable to determine the offending allergen, skin or blood testing may find it. TREATMENT    Avoid the allergen.   Medications and allergy shots (immunotherapy) can help.   Hay fever may often be treated with antihistamines in pill or nasal spray forms. Antihistamines block the effects of histamine. There are  over-the-counter medicines that may help with nasal congestion and swelling around the eyes. Check with your caregiver before taking or giving this medicine.  If the treatment above does not work, there are many new medications your caregiver can prescribe. Stronger medications may be used if initial measures are ineffective. Desensitizing injections can be used if medications and avoidance fails. Desensitization is when a patient is given ongoing shots until the body becomes less sensitive to the allergen. Make sure you follow up with your caregiver if problems continue. SEEK MEDICAL CARE IF:    You develop fever (more than 100.5 F (38.1 C).   You develop a cough that does not stop easily (persistent).   You have shortness of breath.   You start wheezing.   Symptoms interfere with normal daily activities.  Document Released: 03/13/2001 Document Revised: 09/10/2011 Document Reviewed: 09/22/2008 [facility] Patient Information 2013 [place], [place].     Hives Hives are itchy, red, swollen areas of the skin. They can vary in size and location on your body. Hives can come and go for hours or several days (acute hives) or for several weeks (chronic hives). Hives do not spread from person to person (noncontagious). They may get worse with scratching, exercise, and emotional stress. CAUSES    Allergic reaction to food, additives, or drugs.   Infections, including the common cold.   Illness, such as vasculitis, lupus, or thyroid disease.   Exposure to sunlight, heat, or cold.   Exercise.   Stress.  Contact with chemicals.  SYMPTOMS    Red or white swollen patches on the skin. The patches may change size, shape, and location quickly and repeatedly.   Itching.   Swelling of the hands, feet, and face. This may occur if hives develop deeper in the skin.  DIAGNOSIS   Your caregiver can usually tell what is wrong by performing a physical exam. Skin or blood tests may also be done to  determine the cause of your hives. In some cases, the cause cannot be determined. TREATMENT   Mild cases usually get better with medicines such as antihistamines. Severe cases may require an emergency epinephrine injection. If the cause of your hives is known, treatment includes avoiding that trigger.   HOME CARE INSTRUCTIONS    Avoid causes that trigger your hives.   Take antihistamines as directed by your caregiver to reduce the severity of your hives. Non-sedating or low-sedating antihistamines are usually recommended. Do not drive while taking an antihistamine.   Take any other medicines prescribed for itching as directed by your caregiver.   Wear loose-fitting clothing.   Keep all follow-up appointments as directed by your caregiver.  SEEK MEDICAL CARE IF:    You have persistent or severe itching that is not relieved with medicine.   You have painful or swollen joints.  SEEK IMMEDIATE MEDICAL CARE IF:    You have a fever.   Your tongue or lips are swollen.   You have trouble breathing or swallowing.   You feel tightness in the throat or chest.   You have abdominal pain.  These problems may be the first sign of a life-threatening allergic reaction. Call your local emergency services (911 in [place] MAKE SURE YOU:    Understand these instructions.   Will watch your condition.   Will get help right away if you are not doing well or get worse.  Document Released: 06/18/2005 Document Revised: 12/18/2011 Document Reviewed: 09/11/2011 [facility] Patient Information 2013 [place], [place].

## 2012-04-28 ENCOUNTER — Ambulatory Visit (INDEPENDENT_AMBULATORY_CARE_PROVIDER_SITE_OTHER): Payer: Medicare PPO | Admitting: Internal Medicine

## 2012-04-28 ENCOUNTER — Other Ambulatory Visit (INDEPENDENT_AMBULATORY_CARE_PROVIDER_SITE_OTHER): Payer: Medicare PPO

## 2012-04-28 ENCOUNTER — Encounter: Payer: Self-pay | Admitting: Internal Medicine

## 2012-04-28 VITALS — BP 126/82 | HR 78 | Temp 97.3°F | Resp 16 | Wt 207.0 lb

## 2012-04-28 DIAGNOSIS — Z23 Encounter for immunization: Secondary | ICD-10-CM | POA: Insufficient documentation

## 2012-04-28 DIAGNOSIS — E119 Type 2 diabetes mellitus without complications: Secondary | ICD-10-CM

## 2012-04-28 DIAGNOSIS — E785 Hyperlipidemia, unspecified: Secondary | ICD-10-CM

## 2012-04-28 DIAGNOSIS — Z0001 Encounter for general adult medical examination with abnormal findings: Secondary | ICD-10-CM | POA: Insufficient documentation

## 2012-04-28 DIAGNOSIS — Z0000 Encounter for general adult medical examination without abnormal findings: Secondary | ICD-10-CM

## 2012-04-28 LAB — COMPREHENSIVE METABOLIC PANEL
CO2: 27 mEq/L (ref 19–32)
Creatinine, Ser: 0.8 mg/dL (ref 0.4–1.2)
GFR: 77.35 mL/min (ref >60.00)
Glucose, Bld: 100 mg/dL — ABNORMAL HIGH (ref 70–99)
Total Bilirubin: 0.4 mg/dL (ref 0.3–1.2)
Total Protein: 6.8 g/dL (ref 6.0–8.3)

## 2012-04-28 LAB — LIPID PANEL
Cholesterol: 243 mg/dL — ABNORMAL HIGH (ref 0–200)
Triglycerides: 109 mg/dL (ref 0.0–149.0)

## 2012-04-28 LAB — HEMOGLOBIN A1C: Hgb A1c MFr Bld: 6.3 % (ref 4.6–6.5)
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
## 2012-04-28 NOTE — Patient Instructions (Addendum)
Thanks for coming to see me.  Will check you cholesterol today - it is likely to be elevated above goal due to how high it was last year and medical therapy is likely to be recommended.  Will check blood sugar today and you will be informed of results.  Please follow a NO SUGAR, low fat diet and [facility]

## 2012-04-28 NOTE — Assessment & Plan Note (Addendum)
Lab Results  Component Value Date   HGBA1C 6.5 04/25/2011   Not on a strict no sugar diet and overweight plan  Plan Lab today A1C - recommendations to follow  Addendum - A1C 6.3 %  Continue life-style management  [facility].  No Sugar diet

## 2012-04-28 NOTE — Assessment & Plan Note (Signed)
Interval medical history unremarkable. Was evaluated for chest pain Nov '12 - negative labs, normal EKG, negative CT angio chest. Physical exam notable for obesity. She is current with colorectal cancer screening. She is current with Gyn - has up-coming visit. She is current with breast cancer screening. Immunizations are up to date except for Shingles.  In summary - a nice woman who needs to bring cholesterol under control. She needs to exercise. Will follow her closely in regard to A1C and lipids.

## 2012-04-28 NOTE — Assessment & Plan Note (Addendum)
Last LDL 216!!!!  Plan  For lab today. Patient already requests at least 3 more months of life-style management.  Addendum - LDL 179 - per discussion she will take 3 months to bring this down to 100 or less and if not then she is for medical therapy.

## 2012-09-15 ENCOUNTER — Telehealth: Payer: Self-pay | Admitting: Internal Medicine

## 2012-09-15 ENCOUNTER — Ambulatory Visit (INDEPENDENT_AMBULATORY_CARE_PROVIDER_SITE_OTHER): Payer: MEDICARE | Admitting: Internal Medicine

## 2012-09-15 ENCOUNTER — Encounter: Payer: Self-pay | Admitting: Internal Medicine

## 2012-09-15 VITALS — BP 130/80 | HR 103 | Temp 99.5°F

## 2012-09-15 MED ORDER — DIPHENOXYLATE-ATROPINE 2.5-0.025 MG PO TABS: 1.0000 | ORAL_TABLET | Freq: Four times a day (QID) | ORAL | Status: DC | PRN

## 2012-09-15 MED ORDER — ONDANSETRON HCL 4 MG/2ML IJ SOLN
4.0000 mg | Freq: Once | INTRAMUSCULAR | Status: AC
  Administered 2012-09-15: 4 mg via INTRAVENOUS

## 2012-09-15 MED ORDER — PROMETHAZINE HCL 25 MG PO TABS: 25.0000 mg | ORAL_TABLET | Freq: Four times a day (QID) | ORAL | Status: DC | PRN

## 2012-09-15 NOTE — Telephone Encounter (Signed)
Patient Information:  Caller Name: Sahirah  Phone: [phone]  Patient: Debra Atkinson, Debra Atkinson  Gender: Female  DOB: 08-15-1945  Age: 67 Years  PCP: Illene Regulus (Adults only)  Office Follow Up:  Does the office need to follow up with this patient?: No  Instructions For The Office: N/A   Symptoms  Reason For Call & Symptoms: Vomiting started 8:45 am today and has been vomiting Q45 minutes.  Had normal stool then diarrhea for the last hour - 2 loose stools.  Abdominal cramping prior to the 3rd stool.  Does not feel she is keeping anything down  Reviewed Health History In EMR: Yes  Reviewed Medications In EMR: Yes  Reviewed Allergies In EMR: Yes  Reviewed Surgeries / Procedures: Yes  Date of Onset of Symptoms: 09/15/2012  Treatments Tried: tried to drink water  Treatments Tried Worked: No  Any Fever: Yes  Fever Taken: Oral  Fever Time Of Reading: 11:40:00  Fever Last Reading: 99.2  Guideline(s) Used:  Vomiting  Disposition Per Guideline:   Go to ED Now  Reason For Disposition Reached:   Moderate vomiting (e.g., 3 - 5 times/day) and age > 56  Advice Given:  Clear Liquids:  Sip water or a rehydration drink (e.g., Gatorade or Powerade).  Other options: 1/2 strength flat lemon-lime soda or ginger ale.  After 4 hours without vomiting, increase the amount.  For Non-stop Vomiting, Try Sleeping:  Try to go to sleep (Reason: sleep often empties the stomach and relieves the need to vomit).  When you awaken, resume drinking liquids. Water works best initially.  RN Overrode Recommendation:  Make Appointment  Office protocol  Appointment Scheduled:  09/15/2012 13:15:00 Appointment Scheduled Provider:  Rene Paci (Adults only)

## 2012-09-15 NOTE — Telephone Encounter (Signed)
Yes, it is okay to take all of these 3 medications together if/as needed depending on the symptoms: Promethazine for nausea and vomiting Lomotil for diarrhea and cramping Ibuprofen for headaches, body aches and pain or low-grade fever

## 2012-09-15 NOTE — Telephone Encounter (Signed)
Pt just had an appt today 09/15/12. Pt was given two meds Lomotil & Phenergan. Pt was wondering if it is ok to take ibuprofen with these two meds. Please call pt.

## 2012-09-15 NOTE — Telephone Encounter (Signed)
Pt called back and asking how long she need  to wait or if she has to wait to take those two med Lomotil & Phenergan because of the inj (Zofran) that was given to her today at the appt. Please call pt if she need to wait to take the med since she got Zofran inj.

## 2012-09-15 NOTE — Patient Instructions (Signed)
It was good to see you today. We have reviewed your prior records including labs and tests today Zofran injection today for nausea and vomiting  We have prescribed promethazine tablets for nausea and vomiting to take every 4 hours as needed at home Also Lomotil tablets every 6 hours as needed for diarrhea Your prescription(s) have been submitted to your pharmacy. Please take as directed and contact our office if you believe you are having problem(s) with the medication(s).  B.R.A.T. Diet Your doctor has recommended the B.R.A.T. diet for you or your child until the condition improves. This is often used to help control diarrhea and vomiting symptoms. If you or your child can tolerate clear liquids, you may have:  Bananas.   Rice.   Applesauce.   Toast (and other simple starches such as crackers, potatoes, noodles).  Be sure to avoid dairy products, meats, and fatty foods until symptoms are better. Fruit juices such as apple, grape, and prune juice can make diarrhea worse. Avoid these. Continue this diet for 2 days or as instructed by your caregiver. Document Released: 06/18/2005 Document Revised: 06/07/2011 Document Reviewed: 12/05/2006 [facility] Patient Information 2012 [place], [place].

## 2012-09-15 NOTE — Progress Notes (Signed)
  Subjective:    Patient ID: Debra Atkinson, female    DOB: 1946/03/20, 67 y.o.   MRN: [id number]  Emesis  This is a new problem. The current episode started today. The problem occurs 5 to 10 times per day. The problem has been unchanged. The emesis has an appearance of bile. Maximum temperature: LGF. The fever has been present for less than 1 day. Associated symptoms include abdominal pain, chills, diarrhea, dizziness, a fever, headaches and myalgias. Pertinent negatives include no chest pain, coughing, sweats, URI or weight loss. Risk factors: none known. She has tried nothing for the symptoms.    Past Medical History  Diagnosis Date  . Varicella   . Diabetes mellitus     diet management  . Genital warts     treated podophyllin  . Hyperlipidemia     diet managed  . Concussion '06, '11    Review of Systems  Constitutional: Positive for fever and chills. Negative for weight loss.  Respiratory: Negative for cough.   Cardiovascular: Negative for chest pain.  Gastrointestinal: Positive for vomiting, abdominal pain and diarrhea.  Musculoskeletal: Positive for myalgias.  Neurological: Positive for dizziness and headaches.       Objective:   Physical Exam BP 130/80  Pulse 103  Temp(Src) 99.5 F (37.5 C) (Oral)  SpO2 95% Wt Readings from Last 3 Encounters:  04/28/12 207 lb (93.895 kg)  04/03/12 206 lb (93.441 kg)  04/25/11 207 lb (93.895 kg)   Constitutional: She appears well-developed and well-nourished, but mildly ill -sitting with emesis basin.  Eyes: Conjunctivae and EOM are normal. Pupils are equal, round, and reactive to light. No scleral icterus.  Neck: Normal range of motion. Neck supple. No JVD present. No thyromegaly present.  Cardiovascular: Normal rate, regular rhythm and normal heart sounds.  No murmur heard. No BLE edema. Pulmonary/Chest: Effort normal and breath sounds normal. No respiratory distress. She has no wheezes.  Abdominal: Soft. Bowel sounds are normal.  She exhibits no distension. There is minimal diffuse tenderness without rebound or gaurding. no masses Neurological: She is alert and oriented to person, place, and time. No cranial nerve deficit. Coordination normal.  Skin: Skin is warm and dry. No rash noted. No erythema.  Psychiatric: She has a normal mood and affect. Her behavior is normal. Judgment and thought content normal.   Lab Results  Component Value Date   WBC 8.1 05/24/2011   HGB 12.8 05/24/2011   HCT 38.6 05/24/2011   PLT 328 05/24/2011   GLUCOSE 100* 04/28/2012   CHOL 243* 04/28/2012   TRIG 109.0 04/28/2012   HDL 42.70 04/28/2012   LDLDIRECT 179.0 04/28/2012   ALT 21 04/28/2012   AST 17 04/28/2012   NA 140 04/28/2012   K 4.4 04/28/2012   CL 108 04/28/2012   CREATININE 0.8 04/28/2012   BUN 17 04/28/2012   CO2 27 04/28/2012   TSH 1.95 04/25/2011   INR 0.92 05/24/2011   HGBA1C 6.3 04/28/2012        Assessment & Plan:   Gastroenteritis, acute onset n/v/d<12h - associated with LGF Exam otherwise benign - HD stable, +BS  IM Zofran for nausea and vomiting rx oral promethazine and lomotil - Educated on BRAT diet recommended ibuprofen prn aches  Fibromyalgia - exacerbated by acute viral syndrome - recommended ibuprofen prn  DM2 diet controlled - advised on BRAT diet as above

## 2012-09-15 NOTE — Telephone Encounter (Signed)
Notified pt with md response.../lmb 

## 2012-09-17 ENCOUNTER — Telehealth: Payer: Self-pay

## 2012-09-17 ENCOUNTER — Emergency Department (HOSPITAL_COMMUNITY)
Admission: EM | Admit: 2012-09-17 | Discharge: 2012-09-17 | Disposition: A | Discharge status: Home / Self Care | Payer: MEDICARE | Attending: Emergency Medicine | Admitting: Emergency Medicine

## 2012-09-17 ENCOUNTER — Encounter (HOSPITAL_COMMUNITY): Payer: Self-pay | Admitting: *Deleted

## 2012-09-17 ENCOUNTER — Emergency Department (HOSPITAL_COMMUNITY): Payer: MEDICARE

## 2012-09-17 DIAGNOSIS — E119 Type 2 diabetes mellitus without complications: Secondary | ICD-10-CM | POA: Insufficient documentation

## 2012-09-17 DIAGNOSIS — Z8619 Personal history of other infectious and parasitic diseases: Secondary | ICD-10-CM | POA: Insufficient documentation

## 2012-09-17 DIAGNOSIS — Z862 Personal history of diseases of the blood and blood-forming organs and certain disorders involving the immune mechanism: Secondary | ICD-10-CM | POA: Insufficient documentation

## 2012-09-17 DIAGNOSIS — M79609 Pain in unspecified limb: Secondary | ICD-10-CM | POA: Insufficient documentation

## 2012-09-17 DIAGNOSIS — M545 Low back pain, unspecified: Secondary | ICD-10-CM | POA: Insufficient documentation

## 2012-09-17 DIAGNOSIS — Z8639 Personal history of other endocrine, nutritional and metabolic disease: Secondary | ICD-10-CM | POA: Insufficient documentation

## 2012-09-17 DIAGNOSIS — Z87828 Personal history of other (healed) physical injury and trauma: Secondary | ICD-10-CM | POA: Insufficient documentation

## 2012-09-17 MED ORDER — IBUPROFEN 800 MG PO TABS
800.0000 mg | ORAL_TABLET | Freq: Once | ORAL | Status: AC
  Administered 2012-09-17: 800 mg via ORAL
  Filled 2012-09-17: qty 1

## 2012-09-17 MED ORDER — CYCLOBENZAPRINE HCL 5 MG PO TABS: 5.0000 mg | ORAL_TABLET | Freq: Once | ORAL | Status: DC

## 2012-09-17 MED ORDER — CYCLOBENZAPRINE HCL 5 MG PO TABS: 5.0000 mg | ORAL_TABLET | Freq: Two times a day (BID) | ORAL | Status: DC | PRN

## 2012-09-17 MED ORDER — IBUPROFEN 800 MG PO TABS: 800.0000 mg | ORAL_TABLET | Freq: Once | ORAL | Status: DC

## 2012-09-17 MED ORDER — IBUPROFEN 800 MG PO TABS: 800.0000 mg | ORAL_TABLET | Freq: Three times a day (TID) | ORAL | Status: DC | PRN

## 2012-09-17 MED ORDER — CYCLOBENZAPRINE HCL 10 MG PO TABS
5.0000 mg | ORAL_TABLET | Freq: Once | ORAL | Status: AC
  Administered 2012-09-17: 5 mg via ORAL
  Filled 2012-09-17: qty 1

## 2012-09-17 NOTE — ED Provider Notes (Signed)
Medical screening examination/treatment/procedure(s) were performed by non-physician practitioner and as supervising physician I was immediately available for consultation/collaboration.    Celene Kras, MD 09/17/12 [phone]

## 2012-09-17 NOTE — Telephone Encounter (Signed)
Pt is scheduled for an appt. Thurs.

## 2012-09-17 NOTE — ED Notes (Signed)
Pt c/o lower back pain; hip; thigh and knee pain; since Monday; saw PCP on Monday for stomach flu and given meds for nausea/diarrhea; doctor told her to get checked for bulging disc from vomiting so hard

## 2012-09-17 NOTE — Telephone Encounter (Signed)
Pt called again.  Pain is intense.  It is in her hips and legs also.  Lower back.

## 2012-09-17 NOTE — ED Provider Notes (Signed)
History     CSN: [id number]  Arrival date & time 09/17/12  1949   First MD Initiated Contact with Patient 09/17/12 2059      Chief Complaint  Patient presents with  . Back Pain    (Consider location/radiation/quality/duration/timing/severity/associated sxs/prior treatment) Patient is a 67 y.o. female presenting with back pain. The history is provided by the patient. No language interpreter was used.  Back Pain Location:  Lumbar spine, sacro-iliac joint and gluteal region Quality:  Aching, cramping and stiffness Stiffness is present:  Unable to specify Radiates to:  L thigh, R thigh, L posterior upper leg and R posterior upper leg Pain severity:  Severe Pain is:  Same all the time Onset quality:  Gradual Duration:  2 days Timing:  Intermittent Progression:  Waxing and waning Chronicity:  New Context: physical stress and recent illness   Relieved by:  Ibuprofen Worsened by:  Ambulation, movement, twisting and standing Ineffective treatments:  Lying down Associated symptoms: leg pain   Associated symptoms: no abdominal pain, no bladder incontinence, no bowel incontinence, no chest pain, no dysuria, no fever, no numbness, no paresthesias and no weakness   Risk factors: lack of exercise and obesity     Past Medical History  Diagnosis Date  . Varicella   . Diabetes mellitus     diet management  . Genital warts     treated podophyllin  . Hyperlipidemia     diet managed  . Concussion '06, '11    Past Surgical History  Procedure Laterality Date  . Cholecystectomy  1979    laparotomy  . Appendectomy  1979  . Cesarean section  1978    Family History  Problem Relation Age of Onset  . Diabetes Mother   . Heart disease Mother     CHF  . Cancer Mother 33    colon cancer  . COPD Mother   . Heart disease Father     CAD/MI  . Rheumatic fever Father     History  Substance Use Topics  . Smoking status: Never Smoker   . Smokeless tobacco: Never Used  . Alcohol  Use: No    OB History   Grav Para Term Preterm Abortions TAB SAB Ect Mult Living                  Review of Systems  Constitutional: Negative for fever, chills and diaphoresis.  Cardiovascular: Negative for chest pain.  Gastrointestinal: Negative for abdominal pain and bowel incontinence.  Genitourinary: Negative for bladder incontinence and dysuria.  Musculoskeletal: Positive for back pain.  Neurological: Negative for weakness, numbness and paresthesias.    Allergies  Review of patient's allergies indicates no known allergies.  Home Medications   Current Outpatient Rx  Name  Route  Sig  Dispense  Refill  . ibuprofen (ADVIL,MOTRIN) 800 MG tablet   Oral   Take 800 mg by mouth every 8 (eight) hours as needed for pain.         . promethazine (PHENERGAN) 25 MG tablet   Oral   Take 1 tablet (25 mg total) by mouth every 6 (six) hours as needed for nausea.   30 tablet   0   . cyclobenzaprine (FLEXERIL) 5 MG tablet   Oral   Take 1 tablet (5 mg total) by mouth 2 (two) times daily as needed for muscle spasms.   10 tablet   0   . cyclobenzaprine (FLEXERIL) 5 MG tablet   Oral   Take 1 tablet (  5 mg total) by mouth once.   30 tablet   0   . diphenoxylate-atropine (LOMOTIL) 2.5-0.025 MG per tablet   Oral   Take 1 tablet by mouth 4 (four) times daily as needed for diarrhea or loose stools.   30 tablet   0   . ibuprofen (ADVIL,MOTRIN) 800 MG tablet   Oral   Take 1 tablet (800 mg total) by mouth every 8 (eight) hours as needed for pain.   21 tablet   0   . ibuprofen (ADVIL,MOTRIN) 800 MG tablet   Oral   Take 1 tablet (800 mg total) by mouth once.   30 tablet   0     BP 148/92  Pulse 88  Temp(Src) 98.6 F (37 C)  Resp 99  SpO2 100%  Physical Exam  Nursing note and vitals reviewed. Constitutional: She appears well-developed and well-nourished.  Appears uncomfortable. Unable to find comfortable sitting or standing position  Eyes: Conjunctivae are normal.   Neck: Normal range of motion. Neck supple.  No crepitus   Cardiovascular: Normal rate, regular rhythm and normal heart sounds.   Pulmonary/Chest: Effort normal and breath sounds normal. No respiratory distress. She has no wheezes. She has no rales. She exhibits no tenderness.  Abdominal: Soft. She exhibits no distension. There is no tenderness.  Musculoskeletal: She exhibits tenderness ( TTP along lumbar spine and sacrum. TTP along lumbar paraspinal muscles).  Limited ROM. Able to ambulate without assistance.  Limited hip flexion and extension.  Neg straight leg raise.   Neurological: She is alert. She has normal reflexes. Coordination normal.  Skin: Skin is warm and dry. She is not diaphoretic.    ED Course  Procedures (including critical care time)  Labs Reviewed - No data to display Dg Lumbar Spine Complete  09/17/2012  *RADIOLOGY REPORT*  Clinical Data: Low back pain after vomiting on Monday.  LUMBAR SPINE - COMPLETE 4+ VIEW  Comparison: 04/11/2010  Findings: Mild thoracic curve is likely due to patient positioning. Facet joints demonstrate normal alignment and there is no anterior subluxation of the vertebra.  Mild degenerative changes in the facet joints.  Minimal endplate hypertrophic changes.  No vertebral compression deformities.  Intervertebral disc space heights are preserved.  No focal bone lesion or bone destruction.  No significant changes since the previous study.  IMPRESSION: Mild degenerative changes in the lumbar spine.  No displaced fractures identified.   Original Report Authenticated By: Burman Nieves, M.D.      1. Low back pain       MDM  [phone] female 3 day hx of severe low back pain.  Saw PCP Monday for stomach flu. Has tried ibuprofen with some pain relief at home.  Pt states she feels like she is over her initial illness but still have some postnasal drip.   Pt able to ambulate. Having difficulty finding comfortable position.  Denies numbness, tingling, loss  of bowel or bladder.   PCP suggested imaging to check for bulging disc.   Gave pt Flexeril and ibuprofen.   Ordered Dg lumbar spine complete.  10:38 PM Pt feeling much better.  Sitting more comfortably on exam table. Able ambulate without difficulty now.  Will discuss with attending.    Imaging of lumbar spine: mild degenerative changes in lumbar spine. No displaced fx    Follow-up: will have pt f/u with PCP as needed for back pain.   Precautions/Reasons to return immediately: loss of bowel or bladder, numbness, or tingling in her legs. Unable  to ambulate. Develops persistent fever.  Pt verbalized understanding and agreed with tx plan. Vitals: unremarkable. Pt discharged in stable condition.  Discussed pt with attending throughout ED encounter.         Junius Finner, PA-C 09/17/12 2242

## 2012-09-17 NOTE — Telephone Encounter (Signed)
Pt called stating that Ibuprofen does not work well at controlling her pain sxs (fibromyalgia?) and one of the side effects of the medication is "nervous legs". Pt is requesting MD advisement on medication to control pain, please advise

## 2012-09-17 NOTE — Telephone Encounter (Signed)
Best advice is an office visit to discuss and prescribe treatment for her pain - may add on to Thursday, tomorrow, schedule.

## 2012-09-18 ENCOUNTER — Ambulatory Visit (INDEPENDENT_AMBULATORY_CARE_PROVIDER_SITE_OTHER): Payer: MEDICARE | Admitting: Internal Medicine

## 2012-09-18 ENCOUNTER — Encounter: Payer: Self-pay | Admitting: Internal Medicine

## 2012-09-18 ENCOUNTER — Telehealth (HOSPITAL_COMMUNITY): Payer: Self-pay | Admitting: Emergency Medicine

## 2012-09-18 ENCOUNTER — Telehealth: Payer: Self-pay | Admitting: Internal Medicine

## 2012-09-18 VITALS — BP 126/80 | HR 75 | Temp 97.7°F | Resp 12 | Ht 62.0 in | Wt 192.4 lb

## 2012-09-18 DIAGNOSIS — M545 Low back pain, unspecified: Secondary | ICD-10-CM | POA: Insufficient documentation

## 2012-09-18 MED ORDER — FLUTICASONE PROPIONATE 50 MCG/ACT NA SUSP: 2.0000 | Freq: Every day | NASAL | Status: DC

## 2012-09-18 NOTE — Patient Instructions (Addendum)
1. Recent flare of low back pain with a non-radicular exam both in the ED and in the office.   Plan Daily back exercises (10-15 mintues) - source of information [employer]: search low back pain and stretches  For acute back pain - lay flat on your back on a firm surface with bolsters under your knees for 30 min to several hours until the pain lets up  Take flexeril and ibuprofen 800 mg three times a day until the pain is better.   2. Long history of allergy problems. You can have a flare of symptoms with congestion and drainage which may not be an infection.  Plan Routine care/maintenance - nasal inhalational steroid spray  For acute flare - the decongestant - low dose sudafed and a non-sedating antihistamine, e.g. claritin or allegra (both come as generic.)

## 2012-09-18 NOTE — Telephone Encounter (Signed)
[facility] Triage Call Report Triage Record Num: [id number] Operator: Malachi Paradise Patient Name: Debra Atkinson Call Date & Time: 09/17/2012 5:11:21PM Patient Phone: [phone] PCP: Illene Regulus Patient Gender: Female PCP Fax : [phone] Patient DOB: 1946/02/21 Practice Name: [facility] Reason for Call: Caller: [name]/Patient; PCP: Illene Regulus (Adults only); CB#: [phone]; Call regarding Back Pain; Onset 09/16/12 Pt states she has had the flu with vomiting, diarrhea and taking Promethazine Hydrochloride 25mg . Pt wanting to know if she can take the Percocet with this medication. Emergent symptoms new onset of severe disabling back pain positive per Back Symptoms protocol. Referred pt to [facility] for evaluation. Protocol(s) Used: Back Symptoms Recommended Outcome per Protocol: See ED Immediately Reason for Outcome: New onset of severe disabling back pain (unable to stand upright)

## 2012-09-18 NOTE — ED Notes (Signed)
Prescription(s) not signed.  Pharmacy calling to verify prescription. 

## 2012-09-18 NOTE — Assessment & Plan Note (Signed)
Recent flare of low back pain with a non-radicular exam both in the ED and in the office.   Plan Daily back exercises (10-15 mintues) - source of information [employer]: search low back pain and stretches  For acute back pain - lay flat on your back on a firm surface with bolsters under your knees for 30 min to several hours until the pain lets up  Take flexeril and ibuprofen 800 mg three times a day until the pain is better.

## 2012-09-18 NOTE — Assessment & Plan Note (Signed)
Long history of allergy problems. You can have a flare of symptoms with congestion and drainage which may not be an infection.  Plan Routine care/maintenance - nasal inhalational steroid spray  For acute flare - the decongestant - low dose sudafed and a non-sedating antihistamine, e.g. claritin or allegra (both come as generic.)
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
## 2012-09-26 ENCOUNTER — Telehealth: Payer: Self-pay

## 2012-09-26 MED ORDER — MECLIZINE HCL 50 MG PO TABS: 50.0000 mg | ORAL_TABLET | Freq: Three times a day (TID) | ORAL | Status: DC | PRN

## 2012-09-26 MED ORDER — PSEUDOEPHEDRINE HCL 30 MG PO TABS: 30.0000 mg | ORAL_TABLET | Freq: Four times a day (QID) | ORAL | Status: DC | PRN

## 2012-09-26 NOTE — Telephone Encounter (Signed)
Pt called requesting an Rx for Meclizine to treat dizziness related to sinus infection. Please advise in MEN's absence, thanks!

## 2012-09-26 NOTE — Telephone Encounter (Signed)
ok 

## 2012-09-26 NOTE — Telephone Encounter (Signed)
Rx sent, pt informed of same

## 2012-11-06 ENCOUNTER — Encounter: Payer: Self-pay | Admitting: Family Medicine

## 2012-11-06 ENCOUNTER — Ambulatory Visit (INDEPENDENT_AMBULATORY_CARE_PROVIDER_SITE_OTHER): Payer: Medicare Other | Admitting: Family Medicine

## 2012-11-06 VITALS — BP 112/70 | Temp 98.5°F | Wt 202.0 lb

## 2012-11-06 DIAGNOSIS — B9789 Other viral agents as the cause of diseases classified elsewhere: Secondary | ICD-10-CM

## 2012-11-06 DIAGNOSIS — B349 Viral infection, unspecified: Secondary | ICD-10-CM

## 2012-11-06 MED ORDER — ONDANSETRON HCL 4 MG PO TABS: 4.0000 mg | ORAL_TABLET | Freq: Three times a day (TID) | ORAL | Status: DC | PRN
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
## 2012-11-06 NOTE — Patient Instructions (Addendum)
-  plenty of fluids - ensure getting electrolytes if not eating  -no dairy for 5-7 days  -prilosec daily for 1 week  -follow up with your doctor if worsening or persists or other concerns

## 2012-11-07 ENCOUNTER — Telehealth: Payer: Self-pay | Admitting: Family Medicine

## 2012-11-07 NOTE — Telephone Encounter (Signed)
Called to see how patient is doing - she reports she is doing much better today.

## 2012-11-17 ENCOUNTER — Telehealth: Payer: Self-pay

## 2012-11-17 MED ORDER — IBUPROFEN 800 MG PO TABS: 800.0000 mg | ORAL_TABLET | Freq: Three times a day (TID) | ORAL | Status: DC | PRN

## 2012-11-17 NOTE — Telephone Encounter (Signed)
Pt calls requesting a refill on Ibuprofen 800 mg

## 2012-11-18 ENCOUNTER — Other Ambulatory Visit: Payer: Self-pay

## 2012-11-18 MED ORDER — IBUPROFEN 800 MG PO TABS: 800.0000 mg | ORAL_TABLET | Freq: Three times a day (TID) | ORAL | Status: DC | PRN

## 2013-01-27 ENCOUNTER — Telehealth: Payer: Self-pay | Admitting: Internal Medicine

## 2013-01-27 MED ORDER — IBUPROFEN 800 MG PO TABS: 800.0000 mg | ORAL_TABLET | Freq: Three times a day (TID) | ORAL | Status: DC | PRN

## 2013-01-27 NOTE — Telephone Encounter (Signed)
Prescription has been sent.

## 2013-01-27 NOTE — Telephone Encounter (Signed)
Pt called stated that she need new rx for Ibuprofen. Please call into drug store if this is ok.

## 2013-02-27 ENCOUNTER — Encounter: Payer: Self-pay | Admitting: Internal Medicine

## 2013-05-18 ENCOUNTER — Ambulatory Visit (INDEPENDENT_AMBULATORY_CARE_PROVIDER_SITE_OTHER): Payer: Medicare Other | Admitting: Internal Medicine

## 2013-05-18 ENCOUNTER — Other Ambulatory Visit (INDEPENDENT_AMBULATORY_CARE_PROVIDER_SITE_OTHER): Payer: Medicare Other

## 2013-05-18 ENCOUNTER — Encounter: Payer: Self-pay | Admitting: Internal Medicine

## 2013-05-18 ENCOUNTER — Encounter: Payer: Medicare Other | Admitting: Internal Medicine

## 2013-05-18 VITALS — BP 158/90 | HR 72 | Temp 98.3°F | Ht 62.0 in | Wt 197.0 lb

## 2013-05-18 DIAGNOSIS — E785 Hyperlipidemia, unspecified: Secondary | ICD-10-CM

## 2013-05-18 DIAGNOSIS — E119 Type 2 diabetes mellitus without complications: Secondary | ICD-10-CM

## 2013-05-18 DIAGNOSIS — Z0000 Encounter for general adult medical examination without abnormal findings: Secondary | ICD-10-CM

## 2013-05-18 DIAGNOSIS — Z23 Encounter for immunization: Secondary | ICD-10-CM

## 2013-05-18 LAB — COMPREHENSIVE METABOLIC PANEL
Alkaline Phosphatase: 83 U/L (ref 39–117)
BUN: 15 mg/dL (ref 6–23)
Creatinine, Ser: 0.7 mg/dL (ref 0.4–1.2)
Glucose, Bld: 99 mg/dL (ref 70–99)
Sodium: 138 mEq/L (ref 135–145)
Total Bilirubin: 0.7 mg/dL (ref 0.3–1.2)
Total Protein: 7.1 g/dL (ref 6.0–8.3)

## 2013-05-18 LAB — CBC WITH DIFFERENTIAL/PLATELET
Basophils Absolute: 0 10*3/uL (ref 0.0–0.1)
Eosinophils Absolute: 0.1 10*3/uL (ref 0.0–0.7)
Lymphocytes Relative: 46.7 % — ABNORMAL HIGH (ref 12.0–46.0)
Monocytes Relative: 7.2 % (ref 3.0–12.0)
Platelets: 320 10*3/uL (ref 150.0–400.0)
RDW: 13.6 % (ref 11.5–14.6)

## 2013-05-18 LAB — LIPID PANEL
Cholesterol: 231 mg/dL — ABNORMAL HIGH (ref 0–200)
HDL: 54.9 mg/dL (ref >39.00)
Total CHOL/HDL Ratio: 4
Triglycerides: 103 mg/dL (ref 0.0–149.0)
VLDL: 20.6 mg/dL (ref 0.0–40.0)

## 2013-05-18 LAB — LDL CHOLESTEROL, DIRECT: Direct LDL: 163.8 mg/dL

## 2013-05-18 MED ORDER — IBUPROFEN 800 MG PO TABS: 800.0000 mg | ORAL_TABLET | Freq: Three times a day (TID) | ORAL | Status: DC | PRN

## 2013-05-18 NOTE — Patient Instructions (Signed)
Thanks for coming in.  Your exam is ok except for being overweight. This is a major health threat to your health!! Plan: Diet management: smart food choices, PORTION SIZE CONTROL, regular exercise. Goal - to loose 1-2 lbs.month. Target weight - 165 lbs  For routine lab today including sugar test, A1C, and cholesterol panel. The cholesterol was high last year and expect it to still be high. If the LDL is greater than 100 will need medical therapy. The risk of heart disease in a diabetic is 18 times higher than a non-diabetic making cholesterol control critically important.  The lab results will be posted to MyChart, please sign up today.  [facility]   Will give you a flu shot today. Please check you drug plan to see if shingles vaccine is covered - this is a recommended vaccination.
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
## 2013-05-18 NOTE — Progress Notes (Signed)
Pre visit review using our clinic review tool, if applicable. No additional management support is needed unless otherwise documented below in the visit note. 

## 2013-05-19 MED ORDER — LOVASTATIN 40 MG PO TABS
40.0000 mg | ORAL_TABLET | Freq: Every day | ORAL | Status: DC
Start: 2013-05-19 — End: 2013-08-31

## 2013-05-19 NOTE — Assessment & Plan Note (Signed)
Interval history is benign. Physical exam, sans breast and pelvic, is notable for weight issues. Labs reviewed - she is a candidate for medical therapy and Rx sent to pharmacy. She is current with colorectal and breast cancer screening. Immunizations - due for Prevnar - to return since flu shot given today.  In summary  A nice woman who is doing a good job with blood sugar management. [facility]

## 2013-05-19 NOTE — Assessment & Plan Note (Signed)
Lab Results  Component Value Date   HGBA1C 6.3 05/18/2013   OK control on diet and life style alone. Does need lipid management.

## 2013-05-19 NOTE — Assessment & Plan Note (Addendum)
lipid panel reveals LDL that is too high @ 163.  Plan As discussed at her visit will now start medical therapy - lovastatin 40 mg once a day. Rx to pharmacy

## 2013-06-20 ENCOUNTER — Encounter: Payer: Self-pay | Admitting: Internal Medicine

## 2013-06-20 ENCOUNTER — Ambulatory Visit (INDEPENDENT_AMBULATORY_CARE_PROVIDER_SITE_OTHER): Payer: Medicare Other | Admitting: Internal Medicine

## 2013-06-20 VITALS — BP 120/76 | HR 72 | Temp 98.7°F | Wt 198.6 lb

## 2013-06-20 DIAGNOSIS — L255 Unspecified contact dermatitis due to plants, except food: Secondary | ICD-10-CM

## 2013-06-20 DIAGNOSIS — L237 Allergic contact dermatitis due to plants, except food: Secondary | ICD-10-CM

## 2013-06-20 DIAGNOSIS — L259 Unspecified contact dermatitis, unspecified cause: Secondary | ICD-10-CM

## 2013-06-20 DIAGNOSIS — E119 Type 2 diabetes mellitus without complications: Secondary | ICD-10-CM

## 2013-06-20 MED ORDER — METHYLPREDNISOLONE ACETATE 80 MG/ML IJ SUSP
80.0000 mg | Freq: Once | INTRAMUSCULAR | Status: AC
  Administered 2013-06-20: 80 mg via INTRAMUSCULAR

## 2013-06-20 MED ORDER — PREDNISONE (PAK) 10 MG PO TABS: ORAL_TABLET | ORAL | Status: DC

## 2013-06-20 MED ORDER — TRIAMCINOLONE ACETONIDE 0.1 % EX CREA: 1.0000 "application " | TOPICAL_CREAM | Freq: Two times a day (BID) | CUTANEOUS | Status: DC

## 2013-06-20 MED ORDER — HYDROXYZINE HCL 10 MG PO TABS: 10.0000 mg | ORAL_TABLET | Freq: Three times a day (TID) | ORAL | Status: DC | PRN

## 2013-06-20 NOTE — Progress Notes (Signed)
   Subjective:    Patient ID: Debra Atkinson, female    DOB: 03-02-1946, 67 y.o.   MRN: [id number]  Rash Pertinent negatives include no fatigue or fever.    Complains of skin rash located bilateral wrist, hands, trunk and face Associated with intense itching Precipitated by outdoor yard work cleaning leaves x48 hours Not improved with calamine lotion  PMH reviewed  Review of Systems  Constitutional: Negative for fever and fatigue.  Skin: Positive for rash. Negative for color change and wound.       Objective:   Physical Exam BP 120/76  Pulse 72  Temp(Src) 98.7 F (37.1 C) (Oral)  Wt 198 lb 9.6 oz (90.084 kg)  SpO2 97% Wt Readings from Last 3 Encounters:  06/20/13 198 lb 9.6 oz (90.084 kg)  05/18/13 197 lb (89.359 kg)  11/06/12 202 lb (91.627 kg)   Gen.: obese, scratching at rashes on the hands and flexor surface of rest -Nontoxic Lungs: Clear to auscultation Cardiovascular: Regular rate and rhythm Skin: Contact her tightness consistent with poison ivy on flexor surface of wrist right greater than left side. Satellite/isolated lesions on fingers, upper right  Arm, trunk without surrounding cellulitis  Lab Results  Component Value Date   WBC 6.9 05/18/2013   HGB 12.9 05/18/2013   HCT 38.5 05/18/2013   PLT 320.0 05/18/2013   GLUCOSE 99 05/18/2013   CHOL 231* 05/18/2013   TRIG 103.0 05/18/2013   HDL 54.90 05/18/2013   LDLDIRECT 163.8 05/18/2013   ALT 23 05/18/2013   AST 15 05/18/2013   NA 138 05/18/2013   K 4.2 05/18/2013   CL 104 05/18/2013   CREATININE 0.7 05/18/2013   BUN 15 05/18/2013   CO2 28 05/18/2013   TSH 1.95 04/25/2011   INR 0.92 05/24/2011   HGBA1C 6.3 05/18/2013        Assessment & Plan:   Contact dermatitis. Suspect poison ivy and/or reaction to insect bites during recent yard cleanup.  IM Medrol 80 mg given today  Prednisone taper over next 6 days, Atarax 3 times a day and triamcinolone ointment twice a day as needed Prescription sent  electronically, patient education provided  Patient will call sooner if symptoms worse or unimproved Instructed to wear gloves, longsleeved shirt and pants with socks and boots when working outdoors

## 2013-06-20 NOTE — Assessment & Plan Note (Signed)
diet controlled Lab Results  Component Value Date   HGBA1C 6.3 05/18/2013   patient advised on anticipated increase in cbgs due to steroid effect for next several days - pt will call if cbg>200

## 2013-06-20 NOTE — Progress Notes (Signed)
Pre-visit discussion using our clinic review tool. No additional management support is needed unless otherwise documented below in the visit note.  

## 2013-06-20 NOTE — Patient Instructions (Addendum)
It was good to see you today.  IM Medrol given to you for itching today  Use prednisone taper over next 6 days, hydroxyzine 3 times daily for itch over next 5 days then as needed Also triamcinolone cream twice daily to rash as needed, okay to use calamine on skin as needed  Your prescription(s) have been submitted to your pharmacy. Please take as directed and contact our office if you believe you are having problem(s) with the medication(s).  Watch your sugars and call if over 200 in next few days - steroids will increase your sugars more than usual for next few days!   Poison [employer] ivy is a inflammation of the skin (contact dermatitis) caused by touching the allergens on the leaves of the ivy plant following previous exposure to the plant. The rash usually appears 48 hours after exposure. The rash is usually bumps (papules) or blisters (vesicles) in a linear pattern. Depending on your own sensitivity, the rash may simply cause redness and itching, or it may also progress to blisters which may break open. These must be well cared for to prevent secondary bacterial (germ) infection, followed by scarring. Keep any open areas dry, clean, dressed, and covered with an antibacterial ointment if needed. The eyes may also get puffy. The puffiness is worst in the morning and gets better as the day progresses. This dermatitis usually heals without scarring, within 2 to 3 weeks without treatment. HOME CARE INSTRUCTIONS  Thoroughly wash with soap and water as soon as you have been exposed to poison ivy. You have about one half hour to remove the plant resin before it will cause the rash. This washing will destroy the oil or antigen on the skin that is causing, or will cause, the rash. Be sure to wash under your fingernails as any plant resin there will continue to spread the rash. Do not rub skin vigorously when washing affected area. Poison ivy cannot spread if no oil from the plant remains on your body. A  rash that has progressed to weeping sores will not spread the rash unless you have not washed thoroughly. It is also important to wash any clothes you have been wearing as these may carry active allergens. The rash will return if you wear the unwashed clothing, even several days later. Avoidance of the plant in the future is the best measure. Poison ivy plant can be recognized by the number of leaves. Generally, poison ivy has three leaves with flowering branches on a single stem. Diphenhydramine may be purchased over the counter and used as needed for itching. Do not drive with this medication if it makes you drowsy.Ask your caregiver about medication for children. SEEK MEDICAL CARE IF:  Open sores develop.  Redness spreads beyond area of rash.  You notice purulent (pus-like) discharge.  You have increased pain.  Other signs of infection develop (such as fever). Document Released: 06/15/2000 Document Revised: 09/10/2011 Document Reviewed: 05/04/2009 [facility] Patient Information 2014 [place], [place].

## 2013-06-22 ENCOUNTER — Telehealth: Payer: Self-pay | Admitting: *Deleted

## 2013-06-22 ENCOUNTER — Ambulatory Visit: Payer: Medicare Other | Admitting: Internal Medicine

## 2013-06-22 NOTE — Telephone Encounter (Signed)
To[facility]oma Schanz Fax: [phone] From: [facility] Date/ Time: 06/20/2013 1:00 PM Taken By: Yehuda Savannah, CSR Caller: Bonita Quin Facility: not collected Patient: Debra Atkinson, Debra Atkinson DOB: [birth year] Phone: [phone] Reason for Call: See info below Regarding Appointment: Yes Appt Date: 06/22/2013 Appt Time: 11:15:00 AM Provider: Illene Regulus (Adults only) Reason: Cancel Appointment Details: ATTENTION:!!! Luana Shu called on Saturday (June 20, 2013 at 12:57) to Cancel her Appointment on Monday (June 22, 2013 at 11:15 AM) with Dr. Debby Bud

## 2013-06-22 NOTE — Telephone Encounter (Signed)
[facility] Triage Call Report Triage Record Num: [id number] Operator: April Finney Patient Name: Debra Atkinson Call Date & Time: 06/20/2013 8:35:19AM Patient Phone: [phone] PCP: Illene Regulus Patient Gender: Female PCP Fax : [phone] Patient DOB: April 01, 1946 Practice Name: [facility] Reason for Call: Caller: [name]/Patient; PCP: Illene Regulus (Adults only); CB#: [phone]; Call regarding Burning leaves this week, no difficulty with breathing. Having pain in back and knots are coming out on arm and legs.; Knots are approx the size of a quarter and very itchy with clear sticky drainage. Afebrile. Used [employer] and was ineffective. No emergent symptoms. See in 4 hrs care advice given per rash protocol for signs and symptoms of worsening infection quickly getting larger or more areas develop, becoming more painful, purulent drainage, new fever not improving with treatment. Appt scheduled in epic for today at 11:15am. Protocol(s) Used: Rash Recommended Outcome per Protocol: See Provider within 4 hours Reason for Outcome: Signs and symptoms of worsening infection (quickly getting larger or more areas develop; becoming more painful; purulent drainage; new fever; not improving with treatment) Care Advice: ~ Call provider if symptoms worsen or new symptoms develop. ~ SYMPTOM / CONDITION MANAGEMENT Controlling or Preventing Skin Infections: - Carefully wash hands briskly using warm water and soap for at least 15 seconds (sing Happy Birthday) before and after touching affected area or you can use a 62% alcohol-based hand sanitizer. - Cover any lesion with a clean, dry bandage until healed. Dispose of old bandages in a plastic bag and throw out with regular trash. - Avoid close contact with others until wound is healed. - Do not share personal items like towels, razors or combs. - Shower after working out in a gym or after Corporate treasurer. Avoid sharing athletic  equipment or wipe down with antibacterial wipes before use. - Wash towels and bed linens in hot water and bleach. Dry them in a hot dryer. - Wash clothes after each wearing, especially athletic or gym clothes. - If your provider tells you that have a staph or MRSA skin infection, be sure to tell other providers that may be caring for you. - If you are given an antibiotic, take ALL of the doses, even if the infection is getting better. ~ 06/20/2013 8:51:07AM Page 1 of 1 [id number]

## 2013-07-29 ENCOUNTER — Other Ambulatory Visit: Payer: Self-pay | Admitting: Internal Medicine

## 2013-08-14 ENCOUNTER — Encounter: Payer: Self-pay | Admitting: Internal Medicine

## 2013-08-14 ENCOUNTER — Ambulatory Visit (INDEPENDENT_AMBULATORY_CARE_PROVIDER_SITE_OTHER): Payer: Commercial Managed Care - HMO | Admitting: Internal Medicine

## 2013-08-14 ENCOUNTER — Telehealth: Payer: Self-pay | Admitting: *Deleted

## 2013-08-14 VITALS — BP 130/80 | HR 81 | Temp 98.3°F | Wt 198.6 lb

## 2013-08-14 DIAGNOSIS — L0201 Cutaneous abscess of face: Secondary | ICD-10-CM

## 2013-08-14 DIAGNOSIS — L03211 Cellulitis of face: Secondary | ICD-10-CM

## 2013-08-14 DIAGNOSIS — IMO0002 Reserved for concepts with insufficient information to code with codable children: Secondary | ICD-10-CM

## 2013-08-14 MED ORDER — MUPIROCIN 2 % EX OINT: TOPICAL_OINTMENT | CUTANEOUS | Status: DC

## 2013-08-14 MED ORDER — IBUPROFEN 800 MG PO TABS: ORAL_TABLET | ORAL | Status: DC

## 2013-08-14 NOTE — Progress Notes (Signed)
Pre visit review using our clinic review tool, if applicable. No additional management support is needed unless otherwise documented below in the visit note. 

## 2013-08-14 NOTE — Progress Notes (Signed)
   Subjective:    Patient ID: Debra Atkinson, female    DOB: 05/05/1946, 68 y.o.   MRN: [id number]  HPI  Presents with complaint of 2 erythematous pustules on chin and one on  R arm since yesterday. Reports she used Vaseline as a face moisturizer yesterday then noted a red bumps on chin; began picking at the sites and used peroxide on the areas, as well as an OTC antiobiotic ointment.   In the Fall she was apparently bitten by ticks  after she was raking leaves; she had similar lesions on the upper extremities at that time.   Review of Systems  She has no fever, chills, or sweats.  Her most recent A1c was 6.3% in November 2014.  She denies symptoms or signs of rhinosinusitis such as frontal headache, facial pain, dental pain, nasal purulence.    Objective:   Physical Exam  She appears healthy and well-nourished in no distress  She ihas 2 areas of erythema over the chin with associated pustules.  No conjunctivitis or conjunctival hemorrhages are present  She has no lymphadenopathy about the head neck or axilla  Chest is clear to auscultation  She has a regular rhythm with no significant murmurs.  She has a faint area of erythema over the right forearm with a central pustule as well       Assessment & Plan:  #1 cellulitis of the chin and right forearm  #2 diabetes, well controlled  Plan: Skin hygiene discussed. See orders

## 2013-08-14 NOTE — Addendum Note (Signed)
Addended by: Harl Bowie on: 08/14/2013 03:30 PM   Modules accepted: Orders

## 2013-08-14 NOTE — Patient Instructions (Addendum)
   Please avoid breaking the skin which is your  best protection against infections. Please report progressive redness or swelling, fever or pus formation.

## 2013-08-14 NOTE — Telephone Encounter (Signed)
Patient phoned triage stating that she had a couple of bumps on her chin, that she picked & scratched and they began draining a yellow drainage (2 days) and now, she has a bump that looks similar on her arm.  They're red and resemble blisters.  She's tried hydrogen peroxide and abx cream.  Transferred her to scheduling for appt after advising her to stop scratching.

## 2013-08-31 ENCOUNTER — Encounter: Payer: Self-pay | Admitting: Internal Medicine

## 2013-08-31 ENCOUNTER — Telehealth: Payer: Self-pay | Admitting: *Deleted

## 2013-08-31 ENCOUNTER — Ambulatory Visit (INDEPENDENT_AMBULATORY_CARE_PROVIDER_SITE_OTHER): Payer: Commercial Managed Care - HMO | Admitting: Internal Medicine

## 2013-08-31 VITALS — BP 144/92 | HR 104 | Temp 98.9°F | Wt 195.8 lb

## 2013-08-31 DIAGNOSIS — J069 Acute upper respiratory infection, unspecified: Secondary | ICD-10-CM

## 2013-08-31 MED ORDER — AMOXICILLIN-POT CLAVULANATE 875-125 MG PO TABS: 1.0000 | ORAL_TABLET | Freq: Two times a day (BID) | ORAL | Status: DC

## 2013-08-31 NOTE — Progress Notes (Signed)
Pre visit review using our clinic review tool, if applicable. No additional management support is needed unless otherwise documented below in the visit note. 

## 2013-08-31 NOTE — Telephone Encounter (Signed)
Patient phoned with following sxs:  Runny nose, nasal congestion, nasal mucus is thick & yellow & bloody; stuffy nose; firey red sore throat, chills.  Has been taking Mucinex D & Claritin.  Discussed possible OV, but all providers are full for today.  Please advise--schedule work in with you or phone in something?  CB# [phone]

## 2013-08-31 NOTE — Patient Instructions (Signed)
Upper respiratory infection - looks more like otitis media although there is a red throat. No significant sinus tenderness.  Plan Augmentin 875 mg twice a day for 7 days (antibiotic)  Mucinex (-plain) 1200 mg twice a day  Robitussin DM 1 tsp every 4 hours as needed for cough  Sudafed (generic) 30 mg three times a day  Continue nasal saline  Flonase nasal spray - 2 sprays to each nostril once a day and follow with gargling water after flonase treatment  Tylenol 500 mg 1 or 2 tablets 3 times.  Cholesterol level - too high!  Plan Start the lovastatin daily  Follow up lab after 3-4 weeks of treatment   Upper Respiratory Infection, Adult An upper respiratory infection (URI) is also sometimes known as the common cold. The upper respiratory tract includes the nose, sinuses, throat, trachea, and bronchi. Bronchi are the airways leading to the lungs. Most people improve within 1 week, but symptoms can last up to 2 weeks. A residual cough may last even longer.  CAUSES Many different viruses can infect the tissues lining the upper respiratory tract. The tissues become irritated and inflamed and often become very moist. Mucus production is also common. A cold is contagious. You can easily spread the virus to others by oral contact. This includes kissing, sharing a glass, coughing, or sneezing. Touching your mouth or nose and then touching a surface, which is then touched by another person, can also spread the virus. SYMPTOMS  Symptoms typically develop 1 to 3 days after you come in contact with a cold virus. Symptoms vary from person to person. They may include:  Runny nose.  Sneezing.  Nasal congestion.  Sinus irritation.  Sore throat.  Loss of voice (laryngitis).  Cough.  Fatigue.  Muscle aches.  Loss of appetite.  Headache.  Low-grade fever. DIAGNOSIS  You might diagnose your own cold based on familiar symptoms, since most people get a cold 2 to 3 times a year. Your caregiver  can confirm this based on your exam. Most importantly, your caregiver can check that your symptoms are not due to another disease such as strep throat, sinusitis, pneumonia, asthma, or epiglottitis. Blood tests, throat tests, and X-rays are not necessary to diagnose a common cold, but they may sometimes be helpful in excluding other more serious diseases. Your caregiver will decide if any further tests are required. RISKS AND COMPLICATIONS  You may be at risk for a more severe case of the common cold if you smoke cigarettes, have chronic heart disease (such as heart failure) or lung disease (such as asthma), or if you have a weakened immune system. The very young and very old are also at risk for more serious infections. Bacterial sinusitis, middle ear infections, and bacterial pneumonia can complicate the common cold. The common cold can worsen asthma and chronic obstructive pulmonary disease (COPD). Sometimes, these complications can require emergency medical care and may be life-threatening. PREVENTION  The best way to protect against getting a cold is to practice good hygiene. Avoid oral or hand contact with people with cold symptoms. Wash your hands often if contact occurs. There is no clear evidence that vitamin C, vitamin E, echinacea, or exercise reduces the chance of developing a cold. However, it is always recommended to get plenty of rest and practice good nutrition. TREATMENT  Treatment is directed at relieving symptoms. There is no cure. Antibiotics are not effective, because the infection is caused by a virus, not by bacteria. Treatment may include:  Increased fluid intake. Sports drinks offer valuable electrolytes, sugars, and fluids.  Breathing heated mist or steam (vaporizer or shower).  Eating chicken soup or other clear broths, and maintaining good nutrition.  Getting plenty of rest.  Using gargles or lozenges for comfort.  Controlling fevers with ibuprofen or acetaminophen as  directed by your caregiver.  Increasing usage of your inhaler if you have asthma. Zinc gel and zinc lozenges, taken in the first 24 hours of the common cold, can shorten the duration and lessen the severity of symptoms. Pain medicines may help with fever, muscle aches, and throat pain. A variety of non-prescription medicines are available to treat congestion and runny nose. Your caregiver can make recommendations and may suggest nasal or lung inhalers for other symptoms.  HOME CARE INSTRUCTIONS   Only take over-the-counter or prescription medicines for pain, discomfort, or fever as directed by your caregiver.  Use a warm mist humidifier or inhale steam from a shower to increase air moisture. This may keep secretions moist and make it easier to breathe.  Drink enough water and fluids to keep your urine clear or pale yellow.  Rest as needed.  Return to work when your temperature has returned to normal or as your caregiver advises. You may need to stay home longer to avoid infecting others. You can also use a face mask and careful hand washing to prevent spread of the virus. SEEK MEDICAL CARE IF:   After the first few days, you feel you are getting worse rather than better.  You need your caregiver's advice about medicines to control symptoms.  You develop chills, worsening shortness of breath, or brown or red sputum. These may be signs of pneumonia.  You develop yellow or brown nasal discharge or pain in the face, especially when you bend forward. These may be signs of sinusitis.  You develop a fever, swollen neck glands, pain with swallowing, or white areas in the back of your throat. These may be signs of strep throat. SEEK IMMEDIATE MEDICAL CARE IF:   You have a fever.  You develop severe or persistent headache, ear pain, sinus pain, or chest pain.  You develop wheezing, a prolonged cough, cough up blood, or have a change in your usual mucus (if you have chronic lung disease).  You  develop sore muscles or a stiff neck. Document Released: 12/12/2000 Document Revised: 09/10/2011 Document Reviewed: 10/20/2010 [facility] Patient Information 2014 [place], [place].

## 2013-08-31 NOTE — Telephone Encounter (Signed)
Just a FYI-pt lives in [place]

## 2013-08-31 NOTE — Telephone Encounter (Signed)
Patient had already been added on to PCP's schedule for 1500 today.  Pt en route now.
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
## 2013-08-31 NOTE — Telephone Encounter (Signed)
Needs to be seen: either here this PM or at urgent care-in [place].

## 2013-09-22 ENCOUNTER — Encounter (HOSPITAL_COMMUNITY): Payer: Self-pay | Admitting: Emergency Medicine

## 2013-09-22 ENCOUNTER — Emergency Department (HOSPITAL_COMMUNITY)
Admission: EM | Admit: 2013-09-22 | Discharge: 2013-09-23 | Disposition: A | Discharge status: Home / Self Care | Payer: Medicare PPO | Attending: Emergency Medicine | Admitting: Emergency Medicine

## 2013-09-22 DIAGNOSIS — L255 Unspecified contact dermatitis due to plants, except food: Secondary | ICD-10-CM | POA: Insufficient documentation

## 2013-09-22 DIAGNOSIS — S1093XA Contusion of unspecified part of neck, initial encounter: Principal | ICD-10-CM

## 2013-09-22 DIAGNOSIS — L237 Allergic contact dermatitis due to plants, except food: Secondary | ICD-10-CM

## 2013-09-22 DIAGNOSIS — W010XXA Fall on same level from slipping, tripping and stumbling without subsequent striking against object, initial encounter: Secondary | ICD-10-CM | POA: Insufficient documentation

## 2013-09-22 DIAGNOSIS — E119 Type 2 diabetes mellitus without complications: Secondary | ICD-10-CM | POA: Insufficient documentation

## 2013-09-22 DIAGNOSIS — S0003XA Contusion of scalp, initial encounter: Secondary | ICD-10-CM | POA: Insufficient documentation

## 2013-09-22 DIAGNOSIS — S8000XA Contusion of unspecified knee, initial encounter: Secondary | ICD-10-CM | POA: Insufficient documentation

## 2013-09-22 DIAGNOSIS — S0083XA Contusion of other part of head, initial encounter: Secondary | ICD-10-CM

## 2013-09-22 DIAGNOSIS — Y9389 Activity, other specified: Secondary | ICD-10-CM | POA: Insufficient documentation

## 2013-09-22 DIAGNOSIS — S8001XA Contusion of right knee, initial encounter: Secondary | ICD-10-CM

## 2013-09-22 DIAGNOSIS — Y9289 Other specified places as the place of occurrence of the external cause: Secondary | ICD-10-CM | POA: Insufficient documentation

## 2013-09-22 DIAGNOSIS — Z8619 Personal history of other infectious and parasitic diseases: Secondary | ICD-10-CM | POA: Insufficient documentation

## 2013-09-22 NOTE — ED Notes (Signed)
Pt presents with c/o fall that occurred yesterday morning. Pt says she reached over to grab something and lost her balance, fell through a wooden cabinet, eventually hitting her head on a speaker. Pt has some bruising and swelling to her right knee, some bruising to her stomach, and a headache. Pt denies any LOC when she fell. Pt does have some red marks to her face and tonight both of her eyes are bothering her.

## 2013-09-23 ENCOUNTER — Emergency Department (HOSPITAL_COMMUNITY): Payer: Medicare PPO

## 2013-09-23 ENCOUNTER — Encounter (HOSPITAL_COMMUNITY): Payer: Self-pay

## 2013-09-23 MED ORDER — PREDNISONE 10 MG PO TABS: ORAL_TABLET | ORAL | Status: DC

## 2013-09-23 NOTE — ED Provider Notes (Signed)
CSN: [id number]     Arrival date & time 09/22/13  2317 History   First MD Initiated Contact with Patient 09/23/13 0002     Chief Complaint  Patient presents with  . Fall  . Head Injury  . Knee Injury     (Consider location/radiation/quality/duration/timing/severity/associated sxs/prior Treatment) HPI History provided by pt.   Pt had a fall 2 days ago.  Unsure of cause of fall, but believes she lost her balance while bending over.  Landed on her right knee and then fell forward hitting her forehead on speaker.  No LOC.  Developed a hematoma of forehead and severe tenderness, but does not have a headache currently.  Has not had dizziness, vision changes, vomiting, extremity weakness/paresthesias.  Has had increasing edema medial aspect of eyes which is what concerns her most.  Most noticeable after showering last night. Has ecchymosis and pain of right knee.  Able to bear weight.  Has had some relief w/ ibuprofen.  Has been able to continue all daily activities since injury.  She is not anti-coagulated.  Also c/o pruritic rash of chest and R posterior neck x 3-4 days. Temporary relief w/ calamine lotion but sx overall seem to be worsening.  H/o poison oak and current sx similar.  Has been working out in her garden.  Past Medical History  Diagnosis Date  . Varicella   . Diabetes mellitus     diet management  . Genital warts     treated podophyllin  . Hyperlipidemia     diet managed  . Concussion '06, '11   Past Surgical History  Procedure Laterality Date  . Cholecystectomy  1979    laparotomy  . Appendectomy  1979  . Cesarean section  1978   Family History  Problem Relation Age of Onset  . Diabetes Mother   . Heart disease Mother     CHF  . Cancer Mother 3    colon cancer  . COPD Mother   . Heart disease Father     CAD/MI  . Rheumatic fever Father    History  Substance Use Topics  . Smoking status: Never Smoker   . Smokeless tobacco: Never Used  . Alcohol Use: No   OB  History   Grav Para Term Preterm Abortions TAB SAB Ect Mult Living                 Review of Systems  All other systems reviewed and are negative.      Allergies  Review of patient's allergies indicates no known allergies.  Home Medications   Current Outpatient Rx  Name  Route  Sig  Dispense  Refill  . ibuprofen (ADVIL,MOTRIN) 800 MG tablet   Oral   Take 800 mg by mouth every 8 (eight) hours as needed for moderate pain.         . cyclobenzaprine (FLEXERIL) 5 MG tablet   Oral   Take 1 tablet (5 mg total) by mouth 2 (two) times daily as needed for muscle spasms.   10 tablet   0   . diphenoxylate-atropine (LOMOTIL) 2.5-0.025 MG per tablet   Oral   Take 1 tablet by mouth 4 (four) times daily as needed for diarrhea or loose stools.   30 tablet   0    BP 147/69  Pulse 78  Temp(Src) 98.3 F (36.8 C) (Oral)  Resp 16  SpO2 98% Physical Exam  Nursing note and vitals reviewed. Constitutional: She is oriented to person, place, and  time. She appears well-developed and well-nourished. No distress.  HENT:  Head: Normocephalic and atraumatic.  Hematoma middle of forehead w/ tenderness to light palpation.  Ecchymosis and edema medial orbits.  Mild tenderness lower orbits bilaterally.  Mild pain w/ superior gaze. No tenderness or deformity of bridge of nose.    Eyes:  Normal appearance  Neck: Normal range of motion.  Cardiovascular: Normal rate, regular rhythm and intact distal pulses.   Pulmonary/Chest: Effort normal and breath sounds normal. No respiratory distress.  Musculoskeletal: Normal range of motion.  Cervical spine non-tender.  Significant ecchymosis and edema R anterior knee.  Ttp w/ guarding.  Full, active ROM w/ mild pain.  Neurological: She is alert and oriented to person, place, and time.  CN 3-12 intact.  No sensory deficits.  5/5 and equal upper and lower extremity strength.  No past pointing.   Skin: Skin is warm and dry. No rash noted.  Convalescent  maculopapular lesions of left upper chest as well as right posterior neck.  There a few scattered papular lesions of upper extremities as well.   Psychiatric: She has a normal mood and affect. Her behavior is normal.    ED Course  Procedures (including critical care time) Labs Review Labs Reviewed - No data to display Imaging Review Dg Knee Complete 4 Views Right  09/23/2013   CLINICAL DATA:  Fall, pain and bruising.  EXAM: RIGHT KNEE - COMPLETE 4+ VIEW  COMPARISON:  DG KNEE COMPLETE 4 VIEWS*R* dated 04/11/2010  FINDINGS: Seen only on the frontal radiograph is a subtle oblique linear lucency through the patella. No dislocation. Mild tricompartmental marginal spurring has slightly progressed. There is no evidence of arthropathy or other focal bone abnormality. Prepatellar soft tissue swelling. Without subcutaneous gas or radiopaque foreign bodies.  IMPRESSION: Subtle oblique linear lucency through the patella could reflect artifact, possible nondisplaced fracture. No dislocation.  Mild tricompartmental osteoarthrosis. Prepatellar soft tissue swelling could reflect hematoma or bursitis.   Electronically Signed   By: Elon Alas   On: 09/23/2013 01:24   Ct Maxillofacial Wo Cm  09/23/2013   CLINICAL DATA:  Trauma, fall 2 days ago, bruising to medial eye.  EXAM: CT MAXILLOFACIAL WITHOUT CONTRAST  TECHNIQUE: Multidetector CT imaging of the maxillofacial structures was performed. Multiplanar CT image reconstructions were also generated. A small metallic BB was placed on the right temple in order to reliably differentiate right from left.  COMPARISON:  CT HEAD W/O CM dated 04/11/2010  FINDINGS: No facial fracture. The mandible is intact and condyles are located.  Subcentimeter right maxillary mucosal retention cyst, the paranasal sinuses are otherwise well aerated. Nasal septum is mildly deviated to the left with small bony spur. No destructive bony lesions.  Ocular globes and orbital contents are  unremarkable.  IMPRESSION: No facial fracture.   Electronically Signed   By: Elon Alas   On: 09/23/2013 01:33     EKG Interpretation None      MDM   Final diagnoses:  Hematoma of face  Contusion of right knee  Poison oak    [phone] F had a fall w/ frontal head impact 2 days ago and presents w/ increasing edema medial eyelids as well as right knee pain and ecchymosis.  Doubt TBI; injury occurred nearly 48hrs ago, frontal impact, pt is not anti-coagulated, no LOC or other neurologic complaints, no focal neuro deficits on exam.  There is some pain w/ superior gaze and mild tenderness bilateral inferior orbits.  Edema/ecchymosis likely increased following shower  d/t heat, but CT maxillofacial ordered to r/o orbital fx.  No neck pain or cervical spine ttp. R knee xray ordered to r/o fx.  12:53 AM   CT maxillofacial negative and xray of knee shows possible non-displaced patella fx.  Results discussed w/ pt.  Nursing staff placed in knee immobilizer and I referred to ortho.  Recommended ice and elevation.  All concerns addressed and questions answered.  Pt prescribed prednisone for likely poison oak sustained while working in her garden this past weekend as well.  Advised against scratching.  2:08 AM     Remer Macho, PA-C 09/23/13 0210

## 2013-09-23 NOTE — Discharge Instructions (Signed)
Continue to take ibuprofen or tylenol as needed for pain in forehead and right knee.  Continue to apply ice to knee and elevate when possible to reduce swelling and therefore pain.  Follow up with the orthopedic doctor you have been referred to.   You may return to the ER if symptoms worsen or you have any other concerns.

## 2013-09-23 NOTE — ED Notes (Signed)
Pt states Monday morning bent down to get something and fell forward over chair, then after hitting chair fell head first into speaker, pt went down on knees then head first, pt complaining of R knee pain (bruising noted), forehead pain, states had swelling after accident first happen, swelling had decreased but states tender to touch, pt also had bruising to inner part of eyes, denies nasal pain, pt also states has bruising to upper R abdominal region. Pt last took 800 mg ibuprofen at 1900.

## 2013-09-23 NOTE — ED Provider Notes (Signed)
Medical screening examination/treatment/procedure(s) were performed by non-physician practitioner and as supervising physician I was immediately available for consultation/collaboration.   EKG Interpretation None       Krupa Stege M Kore Madlock, MD 09/23/13 0837 

## 2013-10-08 ENCOUNTER — Ambulatory Visit (INDEPENDENT_AMBULATORY_CARE_PROVIDER_SITE_OTHER): Payer: Commercial Managed Care - HMO | Admitting: Internal Medicine

## 2013-10-08 ENCOUNTER — Encounter: Payer: Self-pay | Admitting: Internal Medicine

## 2013-10-08 VITALS — BP 120/70 | HR 95 | Temp 97.7°F | Ht 62.0 in | Wt 200.8 lb

## 2013-10-08 DIAGNOSIS — E785 Hyperlipidemia, unspecified: Secondary | ICD-10-CM

## 2013-10-08 DIAGNOSIS — M797 Fibromyalgia: Secondary | ICD-10-CM | POA: Insufficient documentation

## 2013-10-08 DIAGNOSIS — E119 Type 2 diabetes mellitus without complications: Secondary | ICD-10-CM

## 2013-10-08 DIAGNOSIS — IMO0001 Reserved for inherently not codable concepts without codable children: Secondary | ICD-10-CM

## 2013-10-08 HISTORY — DX: Fibromyalgia: M79.7

## 2013-10-08 MED ORDER — IBUPROFEN 800 MG PO TABS: 800.0000 mg | ORAL_TABLET | Freq: Three times a day (TID) | ORAL | Status: DC | PRN

## 2013-10-08 NOTE — Patient Instructions (Signed)
Please continue all other medications as before, and refills have been done if requested - the ibuprofen  Please have the pharmacy call with any other refills you may need.  Please continue your efforts at being more active, low cholesterol diet, and weight control.  Please return in 6 months, or sooner if needed, with Lab testing done 3-5 days before
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
## 2013-10-08 NOTE — Progress Notes (Signed)
Pre visit review using our clinic review tool, if applicable. No additional management support is needed unless otherwise documented below in the visit note. 

## 2013-10-09 NOTE — Assessment & Plan Note (Signed)
stable overall by history and exam, recent data reviewed with pt, and pt to continue medical treatment as before,  to f/u any worsening symptoms or concerns Lab Results  Component Value Date   CHOL 231* 05/18/2013   HDL 54.90 05/18/2013   LDLDIRECT 163.8 05/18/2013   TRIG 103.0 05/18/2013   CHOLHDL 4 05/18/2013

## 2013-10-09 NOTE — Assessment & Plan Note (Signed)
Ok for ibuprofen prn, consider cymbalta trial

## 2013-10-09 NOTE — Assessment & Plan Note (Signed)
stable overall by history and exam, recent data reviewed with pt, and pt to continue medical treatment as before,  to f/u any worsening symptoms or concerns Lab Results  Component Value Date   HGBA1C 6.3 05/18/2013

## 2013-10-20 ENCOUNTER — Telehealth: Payer: Self-pay

## 2013-10-20 NOTE — Telephone Encounter (Signed)
Relevant patient education assigned to patient using Emmi. ° °

## 2013-11-16 ENCOUNTER — Ambulatory Visit: Payer: Commercial Managed Care - HMO | Admitting: Internal Medicine

## 2013-11-17 ENCOUNTER — Ambulatory Visit (INDEPENDENT_AMBULATORY_CARE_PROVIDER_SITE_OTHER): Payer: Commercial Managed Care - HMO | Admitting: Internal Medicine

## 2013-11-17 ENCOUNTER — Encounter: Payer: Self-pay | Admitting: Internal Medicine

## 2013-11-17 VITALS — BP 138/72 | HR 84 | Temp 98.3°F | Wt 203.0 lb

## 2013-11-17 DIAGNOSIS — S0990XA Unspecified injury of head, initial encounter: Secondary | ICD-10-CM

## 2013-11-17 DIAGNOSIS — R51 Headache: Secondary | ICD-10-CM
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
## 2013-11-17 NOTE — Patient Instructions (Signed)

## 2013-11-17 NOTE — Progress Notes (Signed)
Pre visit review using our clinic review tool, if applicable. No additional management support is needed unless otherwise documented below in the visit note. 

## 2013-11-30 ENCOUNTER — Other Ambulatory Visit (INDEPENDENT_AMBULATORY_CARE_PROVIDER_SITE_OTHER): Payer: Commercial Managed Care - HMO

## 2013-11-30 DIAGNOSIS — E119 Type 2 diabetes mellitus without complications: Secondary | ICD-10-CM

## 2013-11-30 LAB — HEPATIC FUNCTION PANEL
ALK PHOS: 74 U/L (ref 39–117)
ALT: 18 U/L (ref 0–35)
AST: 17 U/L (ref 0–37)
Albumin: 3.7 g/dL (ref 3.5–5.2)
BILIRUBIN DIRECT: 0 mg/dL (ref 0.0–0.3)
BILIRUBIN TOTAL: 0.5 mg/dL (ref 0.2–1.2)
Total Protein: 6.9 g/dL (ref 6.0–8.3)

## 2013-11-30 LAB — BASIC METABOLIC PANEL
BUN: 13 mg/dL (ref 6–23)
CHLORIDE: 106 meq/L (ref 96–112)
CO2: 27 mEq/L (ref 19–32)
CREATININE: 0.7 mg/dL (ref 0.4–1.2)
Calcium: 9.2 mg/dL (ref 8.4–10.5)
GFR: 88.51 mL/min (ref >60.00)
Glucose, Bld: 93 mg/dL (ref 70–99)
Potassium: 4 mEq/L (ref 3.5–5.1)
SODIUM: 140 meq/L (ref 135–145)

## 2013-11-30 LAB — LIPID PANEL
CHOL/HDL RATIO: 5
Cholesterol: 253 mg/dL — ABNORMAL HIGH (ref 0–200)
HDL: 51.1 mg/dL (ref >39.00)
LDL CALC: 170 mg/dL — AB (ref 0–99)
TRIGLYCERIDES: 158 mg/dL — AB (ref 0.0–149.0)
VLDL: 31.6 mg/dL (ref 0.0–40.0)

## 2013-11-30 LAB — HEMOGLOBIN A1C: Hgb A1c MFr Bld: 6.2 % (ref 4.6–6.5)

## 2013-12-01 ENCOUNTER — Other Ambulatory Visit: Payer: Self-pay | Admitting: Internal Medicine

## 2013-12-01 MED ORDER — ATORVASTATIN CALCIUM 10 MG PO TABS: 10.0000 mg | ORAL_TABLET | Freq: Every day | ORAL | Status: DC

## 2013-12-04 ENCOUNTER — Ambulatory Visit (INDEPENDENT_AMBULATORY_CARE_PROVIDER_SITE_OTHER): Payer: Commercial Managed Care - HMO | Admitting: Internal Medicine

## 2013-12-04 ENCOUNTER — Encounter: Payer: Self-pay | Admitting: Internal Medicine

## 2013-12-04 VITALS — BP 120/70 | HR 103 | Temp 98.4°F | Ht 62.0 in | Wt 197.2 lb

## 2013-12-04 DIAGNOSIS — E119 Type 2 diabetes mellitus without complications: Secondary | ICD-10-CM

## 2013-12-04 DIAGNOSIS — H6990 Unspecified Eustachian tube disorder, unspecified ear: Secondary | ICD-10-CM

## 2013-12-04 DIAGNOSIS — H6980 Other specified disorders of Eustachian tube, unspecified ear: Secondary | ICD-10-CM

## 2013-12-04 DIAGNOSIS — E785 Hyperlipidemia, unspecified: Secondary | ICD-10-CM

## 2013-12-04 DIAGNOSIS — J309 Allergic rhinitis, unspecified: Secondary | ICD-10-CM

## 2013-12-04 MED ORDER — FEXOFENADINE HCL 180 MG PO TABS: 180.0000 mg | ORAL_TABLET | Freq: Every day | ORAL | Status: DC

## 2013-12-04 MED ORDER — METHYLPREDNISOLONE ACETATE 80 MG/ML IJ SUSP
80.0000 mg | Freq: Once | INTRAMUSCULAR | Status: AC
  Administered 2013-12-04: 80 mg via INTRAMUSCULAR

## 2013-12-04 NOTE — Progress Notes (Signed)
Pre visit review using our clinic review tool, if applicable. No additional management support is needed unless otherwise documented below in the visit note. 

## 2013-12-04 NOTE — Assessment & Plan Note (Signed)
For mucinex otc prn 

## 2013-12-04 NOTE — Patient Instructions (Signed)
You had the steroid shot today  Please take all new medication as prescribed - the allegra  You can also take Delsym OTC for cough, and/or Mucinex (or it's generic off brand) for congestion, and tylenol as needed for pain.  Please continue all other medications as before, and refills have been done if requested.  Please have the pharmacy call with any other refills you may need.  Please continue your efforts at being more active, low cholesterol diet, and weight control.

## 2013-12-04 NOTE — Assessment & Plan Note (Signed)
Mild to mod, for depomedrol IM, allegra prn, mucinex otc prn,  to f/u any worsening symptoms or concerns

## 2013-12-04 NOTE — Assessment & Plan Note (Signed)
delcines statin,  to f/u any worsening symptoms or concerns Lab Results  Component Value Date   LDLCALC 170* 11/30/2013
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
## 2013-12-04 NOTE — Assessment & Plan Note (Signed)
stable overall by history and exam, recent data reviewed with pt, and pt to continue medical treatment as before,  to f/u any worsening symptoms or concerns Lab Results  Component Value Date   HGBA1C 6.2 11/30/2013

## 2013-12-25 ENCOUNTER — Encounter: Payer: Self-pay | Admitting: Family Medicine

## 2013-12-25 ENCOUNTER — Ambulatory Visit (INDEPENDENT_AMBULATORY_CARE_PROVIDER_SITE_OTHER): Payer: Commercial Managed Care - HMO | Admitting: Family Medicine

## 2013-12-25 VITALS — BP 136/84 | HR 79 | Temp 98.4°F | Ht 62.0 in | Wt 200.0 lb

## 2013-12-25 DIAGNOSIS — J309 Allergic rhinitis, unspecified: Secondary | ICD-10-CM

## 2013-12-25 DIAGNOSIS — R42 Dizziness and giddiness: Secondary | ICD-10-CM

## 2013-12-25 MED ORDER — MECLIZINE HCL 25 MG PO TABS: 25.0000 mg | ORAL_TABLET | Freq: Three times a day (TID) | ORAL | Status: DC | PRN

## 2013-12-25 NOTE — Progress Notes (Signed)
Pre visit review using our clinic review tool, if applicable. No additional management support is needed unless otherwise documented below in the visit note. 
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
## 2013-12-25 NOTE — Patient Instructions (Addendum)
-  take the flonase (or nasocort) and claritin daily  -follow up with your primary doctor regarding the vertigo which has been going on for a very long time to see if they recommend that you see them or a specialist for evaluations or treatment  -follow up with Dr. Ronnald Ramp in 1 month

## 2014-01-08 ENCOUNTER — Encounter: Payer: Self-pay | Admitting: Family Medicine

## 2014-01-08 ENCOUNTER — Ambulatory Visit (INDEPENDENT_AMBULATORY_CARE_PROVIDER_SITE_OTHER): Payer: Commercial Managed Care - HMO | Admitting: Family Medicine

## 2014-01-08 VITALS — BP 136/84 | HR 85 | Ht 62.0 in | Wt 199.0 lb

## 2014-01-08 DIAGNOSIS — M79609 Pain in unspecified limb: Secondary | ICD-10-CM

## 2014-01-08 DIAGNOSIS — M79674 Pain in right toe(s): Secondary | ICD-10-CM
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
## 2014-01-08 NOTE — Progress Notes (Signed)
Pre visit review using our clinic review tool, if applicable. No additional management support is needed unless otherwise documented below in the visit note. 

## 2014-02-17 ENCOUNTER — Ambulatory Visit: Payer: Commercial Managed Care - HMO | Admitting: Internal Medicine

## 2014-02-18 ENCOUNTER — Ambulatory Visit (INDEPENDENT_AMBULATORY_CARE_PROVIDER_SITE_OTHER): Payer: Commercial Managed Care - HMO | Admitting: Internal Medicine

## 2014-02-18 ENCOUNTER — Encounter: Payer: Self-pay | Admitting: Internal Medicine

## 2014-02-18 VITALS — BP 138/84 | HR 112 | Temp 101.7°F | Wt 196.0 lb

## 2014-02-18 DIAGNOSIS — E119 Type 2 diabetes mellitus without complications: Secondary | ICD-10-CM

## 2014-02-18 DIAGNOSIS — R350 Frequency of micturition: Secondary | ICD-10-CM

## 2014-02-18 DIAGNOSIS — R079 Chest pain, unspecified: Secondary | ICD-10-CM | POA: Insufficient documentation

## 2014-02-18 DIAGNOSIS — R0789 Other chest pain: Secondary | ICD-10-CM

## 2014-02-18 DIAGNOSIS — J209 Acute bronchitis, unspecified: Secondary | ICD-10-CM | POA: Insufficient documentation

## 2014-02-18 MED ORDER — LEVOFLOXACIN 250 MG PO TABS: 250.0000 mg | ORAL_TABLET | Freq: Every day | ORAL | Status: DC

## 2014-02-18 MED ORDER — CEFTRIAXONE SODIUM 1 G IJ SOLR
1.0000 g | Freq: Once | INTRAMUSCULAR | Status: AC
  Administered 2014-02-18: 1 g via INTRAMUSCULAR

## 2014-02-18 MED ORDER — HYDROCODONE-HOMATROPINE 5-1.5 MG/5ML PO SYRP: 5.0000 mL | ORAL_SOLUTION | Freq: Four times a day (QID) | ORAL | Status: DC | PRN

## 2014-02-18 NOTE — Patient Instructions (Signed)
You had the antibiotic shot today  Your EKG was OK today  Please take all new medication as prescribed - the antibiotic, and cough medicine  Please continue all other medications as before, and refills have been done if requested.  Please have the pharmacy call with any other refills you may need.  Please keep your appointments with your specialists as you may have planned  Please go to the XRAY Department in the Basement (go straight as you get off the elevator) for the x-ray testing tomorrow at your convenience  Please go to the LAB in the Basement (turn left off the elevator) for the tests to be done today - just the urine test tomorrow  You will be contacted by phone if any changes need to be made immediately.  Otherwise, you will receive a letter about your results with an explanation, but please check with MyChart first.  Please remember to sign up for MyChart if you have not done so, as this will be important to you in the future with finding out test results, communicating by private email, and scheduling acute appointments online when needed.

## 2014-02-18 NOTE — Assessment & Plan Note (Signed)
Mild to mod, for antibx course,  to f/u any worsening symptoms or concerns, also for mucinex, also for cxr - r/o pna

## 2014-02-18 NOTE — Progress Notes (Signed)
Subjective:    Patient ID: Debra Atkinson, female    DOB: [birth year], 68 y.o.   MRN: [id number]  HPI  Here with acute onset mild to mod 2-3 days ST, HA, general weakness and malaise, with prod cough greenish sputum, but Pt denies increased sob or doe, wheezing, orthopnea, PND, increased LE swelling, palpitations, dizziness or syncope, Has had low SSCP, sharp and dull, radiates to back, occas seems pleuritic but not always, not clearly exertional , mild, intermittent x 2-3 days. Also with pain to left lateral post chest as well more clearly pleuritic.   Pt denies polydipsia, polyuria, Pt denies new neurological symptoms such as new headache, or facial or extremity weakness or numbness.  Also some urinary freq for several days but Denies urinary symptoms such as dysuria, frequency, urgency, flank pain, hematuria or n/v, fever, chills. Past Medical History  Diagnosis Date  . Varicella   . Diabetes mellitus     diet management  . Genital warts     treated podophyllin  . Hyperlipidemia     diet managed  . Concussion '06, '11  . Fibromyalgia 10/08/2013   Past Surgical History  Procedure Laterality Date  . Cholecystectomy  1979    laparotomy  . Appendectomy  1979  . Cesarean section  1978    reports that she has never smoked. She has never used smokeless tobacco. She reports that she does not drink alcohol or use illicit drugs. family history includes COPD in her mother; Cancer (age of onset: 70) in her mother; Diabetes in her mother; Heart disease in her father and mother; Rheumatic fever in her father. Allergies  Allergen Reactions  . Prednisone Itching    Redness from injection   Current Outpatient Prescriptions on File Prior to Visit  Medication Sig Dispense Refill  . fexofenadine (ALLEGRA) 180 MG tablet Take 1 tablet (180 mg total) by mouth daily.  30 tablet  11  . ibuprofen (ADVIL,MOTRIN) 800 MG tablet Take 1 tablet (800 mg total) by mouth every 8 (eight) hours as needed for  moderate pain.  60 tablet  1  . meclizine (ANTIVERT) 25 MG tablet Take 1 tablet (25 mg total) by mouth 3 (three) times daily as needed for dizziness.  30 tablet  0   No current facility-administered medications on file prior to visit.   Review of Systems  Constitutional: Negative for unusual diaphoresis or other sweats  HENT: Negative for ringing in ear Eyes: Negative for double vision or worsening visual disturbance.  Respiratory: Negative for choking and stridor.   Gastrointestinal: Negative for vomiting or other signifcant bowel change Genitourinary: Negative for hematuria or decreased urine volume.  Musculoskeletal: Negative for other MSK pain or swelling Skin: Negative for color change and worsening wound.  Neurological: Negative for tremors and numbness other than noted  Psychiatric/Behavioral: Negative for decreased concentration or agitation other than above       Objective:   Physical Exam BP 138/84  Pulse 112  Temp(Src) 101.7 F (38.7 C) (Oral)  Wt 196 lb (88.905 kg)  SpO2 95% VS noted, mild ill Constitutional: Pt appears well-developed, well-nourished.  HENT: Head: NCAT.  Right Ear: External ear normal.  Left Ear: External ear normal.  Eyes: . Pupils are equal, round, and reactive to light. Conjunctivae and EOM are normal Neck: Normal range of motion. Neck supple.  Cardiovascular: Normal rate and regular rhythm.  /mild tachy at times Pulmonary/Chest: Effort normal and breath sounds mild decreased left base - no rales or  wheezing, .  Abd:  Soft, NT, ND, + BS Neurological: Pt is alert. Not confused , motor grossly intact Skin: Skin is warm. No rash Psychiatric: Pt behavior is normal. No agitation.     Assessment & Plan:

## 2014-02-18 NOTE — Assessment & Plan Note (Addendum)
ECG reviewed as per emr - no evidence for pericarditis, fever most likely related to pulm infection, also for cxr as above

## 2014-02-18 NOTE — Assessment & Plan Note (Signed)
Also for UA - r/o UTI

## 2014-02-18 NOTE — Assessment & Plan Note (Signed)
stable overall by history and exam, recent data reviewed with pt, and pt to continue medical treatment as before,  to f/u any worsening symptoms or concerns Lab Results  Component Value Date   HGBA1C 6.2 11/30/2013

## 2014-02-18 NOTE — Progress Notes (Signed)
Pre visit review using our clinic review tool, if applicable. No additional management support is needed unless otherwise documented below in the visit note. 

## 2014-02-19 ENCOUNTER — Other Ambulatory Visit (INDEPENDENT_AMBULATORY_CARE_PROVIDER_SITE_OTHER): Payer: Commercial Managed Care - HMO

## 2014-02-19 ENCOUNTER — Ambulatory Visit (INDEPENDENT_AMBULATORY_CARE_PROVIDER_SITE_OTHER)
Admission: RE | Admit: 2014-02-19 | Discharge: 2014-02-19 | Disposition: A | Discharge status: Home / Self Care | Payer: Commercial Managed Care - HMO | Source: Ambulatory Visit | Attending: Internal Medicine | Admitting: Internal Medicine

## 2014-02-19 DIAGNOSIS — R350 Frequency of micturition: Secondary | ICD-10-CM

## 2014-02-19 DIAGNOSIS — R0789 Other chest pain: Secondary | ICD-10-CM

## 2014-02-19 DIAGNOSIS — J209 Acute bronchitis, unspecified: Secondary | ICD-10-CM

## 2014-02-19 LAB — URINALYSIS, ROUTINE W REFLEX MICROSCOPIC
BILIRUBIN URINE: NEGATIVE
Ketones, ur: NEGATIVE
NITRITE: NEGATIVE
PH: 6 (ref 5.0–8.0)
Specific Gravity, Urine: 1.02 (ref 1.000–1.030)
Total Protein, Urine: NEGATIVE
UROBILINOGEN UA: 0.2 (ref 0.0–1.0)
Urine Glucose: NEGATIVE

## 2014-02-23 ENCOUNTER — Telehealth: Payer: Self-pay

## 2014-02-23 NOTE — Telephone Encounter (Signed)
Pt called for lab results.  Pt c/o dizziness still, chest congestion, and cough. Please advise

## 2014-02-23 NOTE — Telephone Encounter (Signed)
Unfortunately, some symptoms can go on even for several wks  Pt to consider ROV for worsening fever, cough, sob, wheezing, pain, blood or other unusual symtpoms

## 2014-02-24 NOTE — Telephone Encounter (Signed)
Called the patient informed of MD instructions.  The patient did verbalize understanding.

## 2014-03-05 ENCOUNTER — Encounter: Payer: Self-pay | Admitting: Internal Medicine

## 2014-04-09 ENCOUNTER — Other Ambulatory Visit: Payer: Self-pay | Admitting: Internal Medicine

## 2014-04-09 ENCOUNTER — Encounter: Payer: Self-pay | Admitting: Internal Medicine

## 2014-04-09 ENCOUNTER — Telehealth: Payer: Self-pay | Admitting: Internal Medicine

## 2014-04-09 ENCOUNTER — Other Ambulatory Visit (INDEPENDENT_AMBULATORY_CARE_PROVIDER_SITE_OTHER): Payer: Commercial Managed Care - HMO

## 2014-04-09 ENCOUNTER — Other Ambulatory Visit: Payer: Commercial Managed Care - HMO

## 2014-04-09 ENCOUNTER — Ambulatory Visit (INDEPENDENT_AMBULATORY_CARE_PROVIDER_SITE_OTHER): Payer: Commercial Managed Care - HMO | Admitting: Internal Medicine

## 2014-04-09 VITALS — BP 122/82 | HR 85 | Temp 99.1°F | Ht 61.0 in | Wt 193.2 lb

## 2014-04-09 DIAGNOSIS — E119 Type 2 diabetes mellitus without complications: Secondary | ICD-10-CM

## 2014-04-09 DIAGNOSIS — J329 Chronic sinusitis, unspecified: Secondary | ICD-10-CM | POA: Insufficient documentation

## 2014-04-09 DIAGNOSIS — H1013 Acute atopic conjunctivitis, bilateral: Secondary | ICD-10-CM

## 2014-04-09 DIAGNOSIS — H1010 Acute atopic conjunctivitis, unspecified eye: Secondary | ICD-10-CM | POA: Insufficient documentation

## 2014-04-09 DIAGNOSIS — J328 Other chronic sinusitis: Secondary | ICD-10-CM

## 2014-04-09 DIAGNOSIS — J3089 Other allergic rhinitis: Secondary | ICD-10-CM

## 2014-04-09 DIAGNOSIS — Z23 Encounter for immunization: Secondary | ICD-10-CM

## 2014-04-09 LAB — LIPID PANEL
Cholesterol: 274 mg/dL — ABNORMAL HIGH (ref 0–200)
HDL: 44.7 mg/dL (ref >39.00)
LDL CALC: 203 mg/dL — AB (ref 0–99)
NONHDL: 229.3
Total CHOL/HDL Ratio: 6
Triglycerides: 134 mg/dL (ref 0.0–149.0)
VLDL: 26.8 mg/dL (ref 0.0–40.0)

## 2014-04-09 LAB — BASIC METABOLIC PANEL
BUN: 13 mg/dL (ref 6–23)
CALCIUM: 9.6 mg/dL (ref 8.4–10.5)
CO2: 28 mEq/L (ref 19–32)
Chloride: 107 mEq/L (ref 96–112)
Creatinine, Ser: 0.7 mg/dL (ref 0.4–1.2)
GFR: 82.92 mL/min (ref >60.00)
Glucose, Bld: 108 mg/dL — ABNORMAL HIGH (ref 70–99)
Potassium: 3.8 mEq/L (ref 3.5–5.1)
SODIUM: 139 meq/L (ref 135–145)

## 2014-04-09 LAB — HEPATIC FUNCTION PANEL
ALK PHOS: 77 U/L (ref 39–117)
ALT: 29 U/L (ref 0–35)
AST: 28 U/L (ref 0–37)
Albumin: 3.5 g/dL (ref 3.5–5.2)
BILIRUBIN DIRECT: 0.1 mg/dL (ref 0.0–0.3)
Total Bilirubin: 0.7 mg/dL (ref 0.2–1.2)
Total Protein: 7.6 g/dL (ref 6.0–8.3)

## 2014-04-09 LAB — HEMOGLOBIN A1C: Hgb A1c MFr Bld: 6 % (ref 4.6–6.5)

## 2014-04-09 MED ORDER — ATORVASTATIN CALCIUM 20 MG PO TABS: 20.0000 mg | ORAL_TABLET | Freq: Every day | ORAL | Status: DC

## 2014-04-09 MED ORDER — METHYLPREDNISOLONE ACETATE 80 MG/ML IJ SUSP
80.0000 mg | Freq: Once | INTRAMUSCULAR | Status: AC
  Administered 2014-04-09: 80 mg via INTRAMUSCULAR

## 2014-04-09 MED ORDER — CETIRIZINE HCL 10 MG PO TABS: 10.0000 mg | ORAL_TABLET | Freq: Every day | ORAL | Status: DC

## 2014-04-09 MED ORDER — PREDNISONE 10 MG PO TABS: ORAL_TABLET | ORAL | Status: DC

## 2014-04-09 NOTE — Telephone Encounter (Signed)
Called the patient left a detailed message tdap 04/28/12 and Pneumonia 05/04/11.  Informed up to date on tetanus and can discuss with PCP at next OV for next pneumonia.  Informed to call back with any questions or concerns.

## 2014-04-09 NOTE — Progress Notes (Signed)
Pre visit review using our clinic review tool, if applicable. No additional management support is needed unless otherwise documented below in the visit note. 

## 2014-04-09 NOTE — Progress Notes (Signed)
   Subjective:    Patient ID: Debra Atkinson, female    DOB: [birth year], 68 y.o.   MRN: [id number]  HPI  Here with ongoing cough. Meds not working for cough, ongoing since late august.   Does have several mos ongoing nasal allergy symptoms with clearish congestion, itch and sneezing, without fever, pain, ST, cough, swelling or wheezing. No using flonase as she is afraid of thrush occuring.  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 Has not seen ENT prior. Denies worsening reflux, abd pain, dysphagia, n/v, bowel change or blood.  Has low grade temp today Pt denies chest pain, increased sob or doe, wheezing, orthopnea, PND, increased LE swelling, palpitations, dizziness or syncope.  Past Medical History  Diagnosis Date  . Varicella   . Diabetes mellitus     diet management  . Genital warts     treated podophyllin  . Hyperlipidemia     diet managed  . Concussion '06, '11  . Fibromyalgia 10/08/2013   Past Surgical History  Procedure Laterality Date  . Cholecystectomy  1979    laparotomy  . Appendectomy  1979  . Cesarean section  1978    reports that she has never smoked. She has never used smokeless tobacco. She reports that she does not drink alcohol or use illicit drugs. family history includes COPD in her mother; Cancer (age of onset: 43) in her mother; Diabetes in her mother; Heart disease in her father and mother; Rheumatic fever in her father. Allergies  Allergen Reactions  . Prednisone Itching    Redness from injection   Current Outpatient Prescriptions on File Prior to Visit  Medication Sig Dispense Refill  . meclizine (ANTIVERT) 25 MG tablet Take 1 tablet (25 mg total) by mouth 3 (three) times daily as needed for dizziness.  30 tablet  0   No current facility-administered medications on file prior to visit.   Review of Systems  Constitutional: Negative for unusual diaphoresis or other sweats  HENT: Negative for ringing in  ear Eyes: Negative for double vision or worsening visual disturbance.  Respiratory: Negative for choking and stridor.   Gastrointestinal: Negative for vomiting or other signifcant bowel change Genitourinary: Negative for hematuria or decreased urine volume.  Musculoskeletal: Negative for other MSK pain or swelling Skin: Negative for color change and worsening wound.  Neurological: Negative for tremors and numbness other than noted  Psychiatric/Behavioral: Negative for decreased concentration or agitation other than above       Objective:   Physical Exam BP 122/82  Pulse 85  Temp(Src) 99.1 F (37.3 C) (Oral)  Ht 5\' 1"  (1.549 m)  Wt 193 lb 4 oz (87.658 kg)  BMI 36.53 kg/m2  SpO2 97% VS noted, mild ill appearing Constitutional: Pt appears well-developed, well-nourished.  HENT: Head: NCAT.  Right Ear: External ear normal.  Left Ear: External ear normal.  Eyes: . Pupils are equal, round, and reactive to light. Conjunctivae and EOM are normal Bilat tm's with mild erythema.  Max sinus areas mild tender.  Pharynx with mild erythema, no exudate Neck: Normal range of motion. Neck supple.  Cardiovascular: Normal rate and regular rhythm.   Pulmonary/Chest: Effort normal and breath sounds normal.  Neurological: Pt is alert. Not confused , motor grossly intact Skin: Skin is warm. No rash Psychiatric: Pt behavior is normal. No agitation.     Assessment & Plan:

## 2014-04-09 NOTE — Patient Instructions (Addendum)
You had the steroid shot today  Please take all new medication as prescribed - the prednisone, and the zyrtec  Please call if you would like referral to ENT for the sinus cyst and chronic sinusitis  Please continue all other medications as before, including the flonase, which is very important  Please have the pharmacy call with any other refills you may need.  Please keep your appointments with your specialists as you may have planned  Please call if the cough persists more than 1 more wk, as you may need referral to Pulmonary  Please go to the LAB in the Basement (turn left off the elevator) for the tests to be done today  You will be contacted by phone if any changes need to be made immediately.  Otherwise, you will receive a letter about your results with an explanation, but please check with MyChart first.  Please return in 6 months, or sooner if needed

## 2014-04-09 NOTE — Telephone Encounter (Signed)
Pt stopped by front desk on her way out and made her 25mo f/u visit and inquired as to when her last pneumonia and tetanus shots were given.  Please let pt know at [phone].  jsl

## 2014-04-11 NOTE — Assessment & Plan Note (Addendum)
Mild to mod flare, for antibx course,  to f/u any worsening symptoms or concerns, declines ent referral for now for sinus cyst

## 2014-04-11 NOTE — Assessment & Plan Note (Signed)
Also for prepac,, depomedrol IM,  to f/u any worsening symptoms or concerns

## 2014-04-11 NOTE — Assessment & Plan Note (Signed)
stable overall by history and exam, recent data reviewed with pt, and pt to continue medical treatment as before - diet managed,  to f/u any worsening symptoms or concerns, to fu with any onset polys or cbg > 200 with steroid tx Lab Results  Component Value Date   HGBA1C 6.0 04/09/2014

## 2014-04-12 ENCOUNTER — Telehealth: Payer: Self-pay | Admitting: Internal Medicine

## 2014-04-12 NOTE — Telephone Encounter (Signed)
Patient would like for you to call her, she has another question.

## 2014-04-13 NOTE — Telephone Encounter (Signed)
[place] for 1 -2 per day.

## 2014-04-13 NOTE — Telephone Encounter (Signed)
Patient informed. 

## 2014-04-13 NOTE — Telephone Encounter (Signed)
The patient would like to know how/if taking fish oil is ok to take to help lower cholesterol, and how much is ok to take?

## 2014-04-23 ENCOUNTER — Telehealth: Payer: Self-pay | Admitting: Internal Medicine

## 2014-04-23 DIAGNOSIS — J328 Other chronic sinusitis: Secondary | ICD-10-CM

## 2014-04-23 NOTE — Telephone Encounter (Signed)
refferal done

## 2014-04-23 NOTE — Telephone Encounter (Signed)
Patient informed.  She would like a referral to ENT as suggested to call back if needed (04/09/14 OV).

## 2014-04-23 NOTE — Telephone Encounter (Signed)
Patient informed ENT referral done

## 2014-04-23 NOTE — Telephone Encounter (Signed)
At this point, if no fever, ok for mucinex otc prn to help with the congestion

## 2014-04-23 NOTE — Telephone Encounter (Signed)
Patient stated that she still has congestion in her chest, she has been on antibiotics and has had a cortisone shot. please advise on what to do

## 2014-04-27 ENCOUNTER — Telehealth: Payer: Self-pay

## 2014-04-27 MED ORDER — FLUTICASONE PROPIONATE 50 MCG/ACT NA SUSP: 2.0000 | Freq: Every day | NASAL | Status: DC

## 2014-04-27 NOTE — Telephone Encounter (Signed)
Done erx 

## 2014-04-27 NOTE — Telephone Encounter (Signed)
[facility] requesting a refill on fluticasone 50 mcg not on current list.

## 2014-05-24 ENCOUNTER — Telehealth: Payer: Self-pay | Admitting: Internal Medicine

## 2014-05-24 DIAGNOSIS — R42 Dizziness and giddiness: Secondary | ICD-10-CM

## 2014-05-24 DIAGNOSIS — R059 Cough, unspecified: Secondary | ICD-10-CM

## 2014-05-24 DIAGNOSIS — R05 Cough: Secondary | ICD-10-CM

## 2014-05-24 NOTE — Telephone Encounter (Signed)
Pt called in and said that she is still not any better.  She wanted to see if dr [name] Reichmann wanted to see her a 3rd time for this or wanted to referral her?

## 2014-05-25 NOTE — Telephone Encounter (Signed)
Done per emr - referred to pulmonary

## 2014-05-26 NOTE — Telephone Encounter (Signed)
Patient informed. 

## 2014-05-26 NOTE — Telephone Encounter (Signed)
referrl done

## 2014-05-26 NOTE — Addendum Note (Signed)
Addended by: Biagio Borg on: 05/26/2014 12:55 PM   Modules accepted: Orders

## 2014-05-26 NOTE — Telephone Encounter (Signed)
Patient is still having dizziness and wants to know what to do about that, referral neuro??

## 2014-06-08 ENCOUNTER — Ambulatory Visit (INDEPENDENT_AMBULATORY_CARE_PROVIDER_SITE_OTHER)
Admission: RE | Admit: 2014-06-08 | Discharge: 2014-06-08 | Disposition: A | Discharge status: Home / Self Care | Payer: Commercial Managed Care - HMO | Source: Ambulatory Visit | Attending: Internal Medicine | Admitting: Internal Medicine

## 2014-06-08 ENCOUNTER — Ambulatory Visit (INDEPENDENT_AMBULATORY_CARE_PROVIDER_SITE_OTHER): Payer: Commercial Managed Care - HMO | Admitting: Internal Medicine

## 2014-06-08 ENCOUNTER — Encounter: Payer: Self-pay | Admitting: Internal Medicine

## 2014-06-08 ENCOUNTER — Other Ambulatory Visit (INDEPENDENT_AMBULATORY_CARE_PROVIDER_SITE_OTHER): Payer: Commercial Managed Care - HMO

## 2014-06-08 ENCOUNTER — Telehealth: Payer: Self-pay | Admitting: Internal Medicine

## 2014-06-08 VITALS — BP 146/74 | HR 88 | Temp 99.0°F | Ht 61.5 in | Wt 195.2 lb

## 2014-06-08 DIAGNOSIS — R058 Other specified cough: Secondary | ICD-10-CM

## 2014-06-08 DIAGNOSIS — R05 Cough: Secondary | ICD-10-CM

## 2014-06-08 DIAGNOSIS — J309 Allergic rhinitis, unspecified: Secondary | ICD-10-CM

## 2014-06-08 LAB — CBC WITH DIFFERENTIAL/PLATELET
BASOS PCT: 0.3 % (ref 0.0–3.0)
Basophils Absolute: 0 10*3/uL (ref 0.0–0.1)
EOS ABS: 0.1 10*3/uL (ref 0.0–0.7)
Eosinophils Relative: 1.1 % (ref 0.0–5.0)
HCT: 41.5 % (ref 36.0–46.0)
Hemoglobin: 13.6 g/dL (ref 12.0–15.0)
Lymphocytes Relative: 24.6 % (ref 12.0–46.0)
Lymphs Abs: 2.2 10*3/uL (ref 0.7–4.0)
MCHC: 32.8 g/dL (ref 30.0–36.0)
MCV: 83.5 fl (ref 78.0–100.0)
Monocytes Absolute: 1 10*3/uL (ref 0.1–1.0)
Monocytes Relative: 11.5 % (ref 3.0–12.0)
NEUTROS PCT: 62.5 % (ref 43.0–77.0)
Neutro Abs: 5.7 10*3/uL (ref 1.4–7.7)
PLATELETS: 310 10*3/uL (ref 150.0–400.0)
RBC: 4.97 Mil/uL (ref 3.87–5.11)
RDW: 14 % (ref 11.5–15.5)
WBC: 9.1 10*3/uL (ref 4.0–10.5)

## 2014-06-08 MED ORDER — AMOXICILLIN-POT CLAVULANATE 875-125 MG PO TABS: 1.0000 | ORAL_TABLET | Freq: Two times a day (BID) | ORAL | Status: DC

## 2014-06-08 MED ORDER — PREDNISONE 10 MG PO TABS: ORAL_TABLET | ORAL | Status: DC

## 2014-06-08 MED ORDER — FAMOTIDINE 20 MG PO TABS: ORAL_TABLET | ORAL | Status: DC

## 2014-06-08 MED ORDER — TRAMADOL HCL 50 MG PO TABS: ORAL_TABLET | ORAL | Status: DC

## 2014-06-08 MED ORDER — PANTOPRAZOLE SODIUM 40 MG PO TBEC: 40.0000 mg | DELAYED_RELEASE_TABLET | Freq: Every day | ORAL | Status: DC

## 2014-06-08 NOTE — Telephone Encounter (Signed)
Spoke with the pt She is asking if she needs to continue the zyrtec and flonase  I advised yes, she should continue these and follow the detailed instructions given at her visit  She verbalized understanding  Nothing further needed

## 2014-06-08 NOTE — Telephone Encounter (Signed)
LMTCB

## 2014-06-08 NOTE — Telephone Encounter (Signed)
[phone] returning call

## 2014-06-08 NOTE — Patient Instructions (Addendum)
Augmentin 875 mg take one pill twice daily  X 10 days - take at breakfast and supper with large glass of water.  It would help reduce the usual side effects (diarrhea and yeast infections) if you ate cultured yogurt at lunch.   The key to effective treatment for your cough is eliminating the non-stop cycle of cough you're stuck in long enough to let your airway heal completely and then see if there is anything still making you cough once you stop the cough suppression, but this should take no more than 5 days to figure out  First take delsym two tsp every 12 hours and supplement if needed with  tramadol 50 mg up to 2 every 4 hours to suppress the urge to cough at all or even clear your throat. Swallowing water or using ice chips/non mint and menthol containing candies (such as lifesavers or sugarless jolly ranchers) are also effective.  You should rest your voice and avoid activities that you know make you cough.  Once you have eliminated the cough for 3 straight days try reducing the tramadol first,  then the delsym as tolerated.    Prednisone 10 mg take  4 each am x 2 days,   2 each am x 2 days,  1 each am x 2 days and stop (this is to eliminate allergies and inflammation from coughing)  Protonix (pantoprazole) Take 30-60 min before first meal of the day and Pepcid 20 mg one bedtime plus chlorpheniramine 4 mg x 2 at bedtime (both available over the counter)  until cough is completely gone for at least a week without the need for cough suppression  GERD (REFLUX)  is an extremely common cause of respiratory symptoms, many times with no significant heartburn at all.    It can be treated with medication, but also with lifestyle changes including avoidance of late meals, excessive alcohol, smoking cessation, and avoid fatty foods, chocolate, peppermint, colas, red wine, and acidic juices such as orange juice.  NO MINT OR MENTHOL PRODUCTS SO NO COUGH DROPS  USE HARD CANDY INSTEAD (jolley ranchers or  Stover's or Lifesavers (all available in sugarless versions) NO OIL BASED VITAMINS - use powdered substitutes.  Return if not all better in 2 weeks with all active medications in hand - and next flare see [place] asap once flare starts

## 2014-06-08 NOTE — Progress Notes (Signed)
Subjective:    Patient ID: Debra Atkinson, female    DOB: 24-Nov-1945,    MRN: [id number]  HPI   31 yowf never smoked with recurrent pattern of sore throats starting [employer] school saw Debra Atkinson in her 83s with abx and steroid shots maybe spring = fall assoc with nasal /chest congestion fine in between these acute symptoms  But since mid august 2015 developed another flare this time not completely resolved with lingerring  sense of nasal / chest congesiton with neg w/u Debra Atkinson   referred 06/08/2014 to Atkinson for persistent / worsening cough.   06/08/2014 1st Debra Atkinson office visit/ Debra Atkinson   Chief Complaint  Patient presents with  . Atkinson Consult    Referred by Debra. Cathlean Atkinson. Pt c/o chest congestion and occ CP since Oct 2015. She c/o cough and sore throat for the past 3 days. Cough is occ prod with minimal sputum-? color. She had fever of 104 and vomiting 1 day ago.   severe coughing fits to point of vomiting is new as is midlne cp only with severe cough, anterior and the length of her sternum s radiation or nausea/ diaphoresis not made worse walking, just while coughing - no resp so far to her usual rx with abx/ pred/ decongestants and antihistamines Debra Atkinson since 1980s if talks and walks not worse since onset of cough   No obvious other patterns in day to day or daytime variabilty or assoc  chest tightness, subjective wheeze or overt   hb symptoms. No unusual exp hx or h/o childhood pna/ asthma or knowledge of premature birth.  Sleeping ok without nocturnal  or early am exacerbation  of respiratory  c/o's or need for noct saba. Also denies any obvious fluctuation of symptoms with weather or environmental changes or other aggravating or alleviating factors except as outlined above   Current Medications, Allergies, Complete Past Medical History, Past Surgical History, Family History, and Social History were reviewed in [employer] record.           Review of Systems  Constitutional: Negative for fever, chills and unexpected weight change.  HENT: Positive for congestion and sore throat. Negative for dental problem, ear pain, nosebleeds, postnasal drip, rhinorrhea, sinus pressure, sneezing, trouble swallowing and voice change.   Eyes: Negative for visual disturbance.  Respiratory: Positive for cough and shortness of breath. Negative for choking.   Cardiovascular: Positive for chest pain. Negative for leg swelling.  Gastrointestinal: Negative for vomiting, abdominal pain and diarrhea.  Genitourinary: Negative for difficulty urinating.  Musculoskeletal: Negative for arthralgias.  Skin: Negative for rash.  Neurological: Negative for tremors, syncope and headaches.  Hematological: Does not bruise/bleed easily.       Objective:   Physical Exam  amb wf who failed to answer a single question asked in a straightforward manner, tending to go off on tangents or answer questions with ambiguous medical terms or diagnoses and seemed aggravated  when asked the same question more than once for clarification.   Wt Readings from Last 3 Encounters:  06/08/14 195 lb 3.2 oz (88.542 kg)  04/09/14 193 lb 4 oz (87.658 kg)  02/18/14 196 lb (88.905 kg)    Vital signs reviewed   HEENT: nl dentition, turbinates, and orophanx. Nl external ear canals without cough reflex   NECK :  without JVD/Nodes/TM/ nl carotid upstrokes bilaterally   LUNGS: no acc muscle use, clear to A and P bilaterally without cough on insp or exp  maneuvers   CV:  RRR  no s3 or murmur or increase in P2, no edema   ABD:  soft and nontender with nl excursion in the supine position. No bruits or organomegaly, bowel sounds nl  MS:  warm without deformities, calf tenderness, cyanosis or clubbing  SKIN: warm and dry without lesions    NEURO:  alert, approp, no deficits    cxr 06/08/14 No active cardiopulmonary disease Recent Labs Lab 06/08/14 1218  HGB 13.6  HCT  41.5  WBC 9.1  PLT 310.0          Assessment & Plan:

## 2014-06-09 ENCOUNTER — Ambulatory Visit: Payer: Commercial Managed Care - HMO | Admitting: Internal Medicine

## 2014-06-09 LAB — ALLERGY FULL PROFILE
Allergen,Goose feathers, e70: <0.1 kU/L
Bahia Grass: <0.1 kU/L
Bermuda Grass: <0.1 kU/L
Box Elder IgE: <0.1 kU/L
Candida Albicans: <0.1 kU/L
Cat Dander: <0.1 kU/L
Curvularia lunata: <0.1 kU/L
Dog Dander: <0.1 kU/L
Elm IgE: <0.1 kU/L
Fescue: <0.1 kU/L
G005 Rye, Perennial: <0.1 kU/L
G009 Red Top: <0.1 kU/L
Goldenrod: <0.1 kU/L
Helminthosporium halodes: <0.1 kU/L
IgE (Immunoglobulin E), Serum: 9 kU/L (ref <115)
Stemphylium Botryosum: <0.1 kU/L
Timothy Grass: <0.1 kU/L

## 2014-06-09 NOTE — Assessment & Plan Note (Signed)
The most common causes of chronic cough in immunocompetent adults include the following: upper airway cough syndrome (UACS), previously referred to as postnasal drip syndrome (PNDS), which is caused by variety of rhinosinus conditions; (2) asthma; (3) GERD; (4) chronic bronchitis from cigarette smoking or other inhaled environmental irritants; (5) nonasthmatic eosinophilic bronchitis; and (6) bronchiectasis.   These conditions, singly or in combination, have accounted for up to 94% of the causes of chronic cough in prospective studies.   Other conditions have constituted no >6% of the causes in prospective studies These have included bronchogenic carcinoma, chronic interstitial pneumonia, sarcoidosis, left ventricular failure, ACEI-induced cough, and aspiration from a condition associated with pharyngeal dysfunction.    Chronic cough is often simultaneously caused by more than one condition. A single cause has been found from 38 to 82% of the time, multiple causes from 18 to 62%. Multiply caused cough has been the result of three diseases up to 42% of the time.       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   These are the same pts (now being labeled as having "irritable larynx syndrome" by some cough centers) who not infrequently have a history of having failed to tolerate ace inhibitors,  dry powder inhalers or biphosphonates or report having atypical reflux symptoms that don't respond to standard doses of PPI , and are easily confused as  having aecopd or asthma flares by even experienced allergists/ pulmonologists.   The first step is to maximize acid suppression and eliminate cyclical coughing then regroup  With allergy profile sent in meantime and empirically rx as rhinitis/sinusitis with 10 d augmentin since she reports fever (though stronlgy doubt "104" reported with nl exam/ cxr and wbc and no left shift or eos)  See instructions for specific recommendations which were reviewed directly with the patient who was given a copy with highlighter outlining the key components.

## 2014-06-09 NOTE — Assessment & Plan Note (Addendum)
eval by ent/ [facility]/ 05/2014 > neg per pt/ no notes in EPIC  Continue zyrtec/ flonase for now

## 2014-06-09 NOTE — Progress Notes (Signed)
Quick Note:  Spoke with pt and notified of results per Dr. Wert. Pt verbalized understanding and denied any questions.  ______ 

## 2014-06-22 ENCOUNTER — Ambulatory Visit: Payer: Commercial Managed Care - HMO | Admitting: Internal Medicine

## 2014-06-24 ENCOUNTER — Ambulatory Visit (INDEPENDENT_AMBULATORY_CARE_PROVIDER_SITE_OTHER): Payer: Commercial Managed Care - HMO | Admitting: Internal Medicine

## 2014-06-24 ENCOUNTER — Encounter: Payer: Self-pay | Admitting: Internal Medicine

## 2014-06-24 VITALS — BP 148/90 | HR 88 | Ht 61.5 in | Wt 194.0 lb

## 2014-06-24 DIAGNOSIS — R058 Other specified cough: Secondary | ICD-10-CM

## 2014-06-24 DIAGNOSIS — R05 Cough: Secondary | ICD-10-CM

## 2014-06-24 MED ORDER — PREDNISONE 10 MG PO TABS: ORAL_TABLET | ORAL | Status: DC

## 2014-06-24 MED ORDER — TRAMADOL HCL 50 MG PO TABS: ORAL_TABLET | ORAL | Status: DC

## 2014-06-24 NOTE — Progress Notes (Signed)
Subjective:    Patient ID: Debra Atkinson Atkinson, female    DOB: 05/29/1946,    MRN: [id number]    Brief patient profile:  28 yowf never smoked with recurrent pattern of sore throats starting [employer] saw Debra Atkinson Atkinson in her 41s with abx and steroid shots maybe spring = fall assoc with nasal /chest congestion fine in between these acute symptoms  But since mid august 2015 developed another flare this time not completely resolved with lingerring  sense of nasal / chest congesiton with neg w/u Debra Atkinson Debra Atkinson Atkinson   referred 06/08/2014 to pulmonary for persistent / worsening cough.     06/08/2014 1st [facility] office visit/ Debra Atkinson Atkinson   Chief Complaint  Patient presents with  . Pulmonary Consult    Referred by Debra Atkinson. Debra Atkinson Atkinson. Pt c/o chest congestion and occ CP since Oct 2015. She c/o cough and sore throat for the past 3 days. Cough is occ prod with minimal sputum-? color. She had fever of 104 and vomiting 1 day ago.   severe coughing fits to point of vomiting is new as is midline cp only with severe cough, anterior and the length of her sternum s radiation or nausea/ diaphoresis not made worse walking, just while coughing - no resp so far to her usual rx with abx/ pred/ decongestants and antihistamines Debra Atkinson Atkinson since 1980s if talks and walks not worse since onset of cough rec Augmentin 875 mg take one pill twice daily  X 10 days   First take delsym two tsp every 12 hours and supplement if needed with  tramadol 50 mg up to 2 every 4 hours to suppress the urge to cough at all or even clear your throat. Swallowing water or using ice chips/non mint and menthol containing candies (such as lifesavers or sugarless jolly ranchers) are also effective.  You should rest your voice and avoid activities that you know make you cough. Once you have eliminated the cough for 3 straight days try reducing the tramadol first,  then the delsym as tolerated.   Prednisone 10 mg take  4 each am x 2 days,   2 each am x 2  days,  1 each am x 2 days and stop (this is to eliminate allergies and inflammation from coughing) Protonix (pantoprazole) Take 30-60 min before first meal of the day and Pepcid 20 mg one bedtime plus chlorpheniramine 4 mg x 2 at bedtime (both available over the counter)  until cough is completely gone for at least a week without the need for cough suppression GERD diet  Return if not all better in 2 weeks with all active medications in hand - and next flare see [place] asap once flare starts      06/24/2014 f/u ov/Debra Atkinson Atkinson re: cough since mid august 2015 / no meds on hand  Chief Complaint  Patient presents with  . Follow-up    pt states once she stopped taking her meds she began coughing again, coughing since tuesday.  nonprod cough.   not clear at all she followed the instructions but was was "pretty much cough free" for 3 days and then stopped everything and the cough gradually stared back 2 d prior to OV  But not nearly as bad - no vomiting this time nor sign cp with cough     No obvious day to day or daytime variabilty or assoc chronic sob  or cp or chest tightness, subjective wheeze overt sinus or hb symptoms. No unusual exp hx  or h/o childhood pna/ asthma or knowledge of premature birth.  Sleeping ok without nocturnal  or early am exacerbation  of respiratory  c/o's or need for noct saba. Also denies any obvious fluctuation of symptoms with weather or environmental changes or other aggravating or alleviating factors except as outlined above   Current Medications, Allergies, Complete Past Medical History, Past Surgical History, Family History, and Social History were reviewed in [facility] record.  ROS  The following are not active complaints unless bolded sore throat, dysphagia, dental problems, itching, sneezing,  nasal congestion or excess/ purulent secretions, ear ache,   fever, chills, sweats, unintended wt loss, pleuritic or exertional cp, hemoptysis,  orthopnea pnd  or leg swelling, presyncope, palpitations, heartburn, abdominal pain, anorexia, nausea, vomiting, diarrhea  or change in bowel or urinary habits, change in stools or urine, dysuria,hematuria,  rash, arthralgias, visual complaints, headache, numbness weakness or ataxia or problems with walking or coordination,  change in mood/affect or memory.                         Objective:   Physical Exam  amb wf nad  .  06/24/14          194 Wt Readings from Last 3 Encounters:  06/08/14 195 lb 3.2 oz (88.542 kg)  04/09/14 193 lb 4 oz (87.658 kg)  02/18/14 196 lb (88.905 kg)    Vital signs reviewed   HEENT: nl dentition, turbinates, and orophanx. Nl external ear canals without cough reflex   NECK :  without JVD/Nodes/TM/ nl carotid upstrokes bilaterally   LUNGS: no acc muscle use, clear to A and P bilaterally without cough on insp or exp maneuvers   CV:  RRR  no s3 or murmur or increase in P2, no edema   ABD:  soft and nontender with nl excursion in the supine position. No bruits or organomegaly, bowel sounds nl  MS:  warm without deformities, calf tenderness, cyanosis or clubbing  SKIN: warm and dry without lesions    NEURO:  alert, approp, no deficits    cxr 06/08/14 No active cardiopulmonary disease Recent Labs Lab 06/08/14 1218  HGB 13.6  HCT 41.5  WBC 9.1  PLT 310.0          Assessment & Plan:

## 2014-06-24 NOTE — Patient Instructions (Signed)
Please see patient coordinator before you leave today  to schedule sinus ct   The key to effective treatment for your cough is eliminating the non-stop cycle of cough you're stuck in long enough to let your airway heal completely and then see if there is anything still making you cough once you stop the cough suppression, but this should take no more than 5 days to figure out  First take delsym two tsp every 12 hours and supplement if needed with  tramadol 50 mg up to 2 every 4 hours to suppress the urge to cough at all or even clear your throat. Swallowing water or using ice chips/non mint and menthol containing candies (such as lifesavers or sugarless jolly ranchers) are also effective.  You should rest your voice and avoid activities that you know make you cough.  Once you have eliminated the cough for 3 straight days try reducing the tramadol first,  then the delsym as tolerated.    Prednisone 10 mg take  4 each am x 2 days,   2 each am x 2 days,  1 each am x 2 days and stop (this is to eliminate allergies and inflammation from coughing)  Protonix (pantoprazole) Take 30-60 min before first meal of the day and Pepcid 20 mg one bedtime plus chlorpheniramine 4 mg x 2 at bedtime (both available over the counter)  until  You return   GERD (REFLUX)  is an extremely common cause of respiratory symptoms, many times with no significant heartburn at all.    It can be treated with medication, but also with lifestyle changes including avoidance of late meals, excessive alcohol, smoking cessation, and avoid fatty foods, chocolate, peppermint, colas, red wine, and acidic juices such as orange juice.  NO MINT OR MENTHOL PRODUCTS SO NO COUGH DROPS  USE HARD CANDY INSTEAD (jolley ranchers or Stover's or Lifesavers (all available in sugarless versions) NO OIL BASED VITAMINS - use powdered substitutes.  Return   2 weeks with all active medications in hand - and next flare see [place] asap once flare starts

## 2014-06-25 NOTE — Assessment & Plan Note (Signed)
-   Allergy profile 06/08/2014 > no Eos, IgE 9 and RAST neg   Can't really sort out cough based on response to first ov   I had an extended discussion with the patient reviewing all relevant studies completed to date and  lasting 15 to 20 minutes of a 25 minute visit on the following ongoing concerns:  The standardized cough guidelines published in Chest by Lissa Morales in 2006 are still the best available and consist of a multiple step process (up to 12!) , not a single office visit,  and are intended  to address this problem logically,  with an alogrithm dependent on response to empiric treatment at  each progressive step  to determine a specific diagnosis with  minimal addtional testing needed. Therefore if adherence is an issue or can't be accurately verified,  it's very unlikely the standard evaluation and treatment will be successful here.    Furthermore, response to therapy (other than acute cough suppression, which should only be used short term with avoidance of narcotic containing cough syrups if possible), can be a gradual process for which the patient may perceive immediate benefit.  Unlike going to an eye doctor where the best perscription is almost always the first one and is immediately effective, this is almost never the case in the management of chronic cough syndromes. Therefore the patient needs to commit up front to consistently adhere to recommendations  for up to 6 weeks of therapy directed at the likely underlying problem(s) before the response can be reasonably evaluated.   rec Repeat step 1 s abx but check sinus ct to be complete then return with all meds in hand to regroup in 2 weeks  See instructions for specific recommendations which were reviewed directly with the patient who was given a copy with highlighter outlining the key components.

## 2014-07-01 ENCOUNTER — Ambulatory Visit (INDEPENDENT_AMBULATORY_CARE_PROVIDER_SITE_OTHER)
Admission: RE | Admit: 2014-07-01 | Discharge: 2014-07-01 | Disposition: A | Discharge status: Home / Self Care | Payer: Commercial Managed Care - HMO | Source: Ambulatory Visit | Attending: Internal Medicine | Admitting: Internal Medicine

## 2014-07-01 DIAGNOSIS — R058 Other specified cough: Secondary | ICD-10-CM

## 2014-07-01 DIAGNOSIS — R05 Cough: Secondary | ICD-10-CM

## 2014-07-07 ENCOUNTER — Ambulatory Visit (INDEPENDENT_AMBULATORY_CARE_PROVIDER_SITE_OTHER): Payer: Commercial Managed Care - HMO | Admitting: Internal Medicine

## 2014-07-07 ENCOUNTER — Encounter: Payer: Self-pay | Admitting: Internal Medicine

## 2014-07-07 VITALS — BP 122/90 | HR 80 | Temp 98.0°F | Ht 61.25 in | Wt 193.0 lb

## 2014-07-07 DIAGNOSIS — R058 Other specified cough: Secondary | ICD-10-CM

## 2014-07-07 DIAGNOSIS — R05 Cough: Secondary | ICD-10-CM | POA: Diagnosis not present

## 2014-07-07 MED ORDER — PREDNISONE 10 MG PO TABS: ORAL_TABLET | ORAL | Status: DC

## 2014-07-07 NOTE — Patient Instructions (Addendum)
  The key to effective treatment for your cough is eliminating the non-stop cycle of cough you're stuck in long enough to let your airway heal completely and then see if there is anything still making you cough once you stop the cough suppression, but this should take no more than 5 days to figure out  First take delsym two tsp every 12 hours and supplement if needed with  tramadol 50 mg up to 2 every 4 hours to suppress the urge to cough at all or even clear your throat. Swallowing water or using ice chips/non mint and menthol containing candies (such as lifesavers or sugarless jolly ranchers) are also effective.  You should rest your voice and avoid activities that you know make you cough.  Once you have eliminated the cough for 3 straight days try reducing the tramadol first,  then the delsym as tolerated.    Prednisone 10 mg take  4 each am x 2 days,   2 each am x 2 days,  1 each am x 2 days and stop (this is to eliminate allergies and inflammation from coughing)  Protonix (pantoprazole) Take 30-60 min before first meal of the day and Pepcid 20 mg one bedtime plus chlorpheniramine 4 mg x 2 at bedtime (both available over the counter)  until  You return   For drainage during the day  take chlortrimeton (chlorpheniramine) 4 mg every 4 hours available over the counter (may cause drowsiness)   GERD (REFLUX)  is an extremely common cause of respiratory symptoms, many times with no significant heartburn at all.    It can be treated with medication, but also with lifestyle changes including avoidance of late meals, excessive alcohol, smoking cessation, and avoid fatty foods, chocolate, peppermint, colas, red wine, and acidic juices such as orange juice.  NO MINT OR MENTHOL PRODUCTS SO NO COUGH DROPS  USE HARD CANDY INSTEAD (jolley ranchers or Stover's or Lifesavers (all available in sugarless versions) NO OIL BASED VITAMINS - use powdered substitutes.  Return   2 weeks with all active medications in  hand

## 2014-07-07 NOTE — Progress Notes (Signed)
Subjective:    Patient ID: Debra Atkinson, female    DOB: 20-Nov-1945,    MRN: [id number]    Brief patient profile:  48 yowf never smoked with recurrent pattern of sore throats starting [employer] saw Dr Debra Atkinson in her 69s with abx and steroid shots maybe spring = fall assoc with nasal /chest congestion fine in between these acute symptoms  But since mid august 2015 developed another flare this time not completely resolved with lingering  sense of nasal / chest congesiton with neg w/u Dr Debra Atkinson referred 06/08/2014 to pulmonary for persistent / worsening cough.     06/08/2014 1st [facility] office visit/ Wert   Chief Complaint  Patient presents with  . Pulmonary Consult    Referred by Dr. Cathlean Cower. Pt c/o chest congestion and occ CP since Oct 2015. She c/o cough and sore throat for the past 3 days. Cough is occ prod with minimal sputum-? color. She had fever of 104 and vomiting 1 day ago.   severe coughing fits to point of vomiting is new as is midline cp only with severe cough, anterior and the length of her sternum s radiation or nausea/ diaphoresis not made worse walking, just while coughing - no resp so far to her usual rx with abx/ pred/ decongestants and antihistamines Debra Atkinson since 1980s if talks and walks not worse since onset of cough rec Augmentin 875 mg take one pill twice daily  X 10 days   First take delsym two tsp every 12 hours and supplement if needed with  tramadol 50 mg up to 2 every 4 hours to suppress the urge to cough at all or even clear your throat. Swallowing water or using ice chips/non mint and menthol containing candies (such as lifesavers or sugarless jolly ranchers) are also effective.  You should rest your voice and avoid activities that you know make you cough. Once you have eliminated the cough for 3 straight days try reducing the tramadol first,  then the delsym as tolerated.   Prednisone 10 mg take  4 each am x 2 days,   2 each am x 2 days,   1 each am x 2 days and stop (this is to eliminate allergies and inflammation from coughing) Protonix (pantoprazole) Take 30-60 min before first meal of the day and Pepcid 20 mg one bedtime plus chlorpheniramine 4 mg x 2 at bedtime (both available over the counter)  until cough is completely gone for at least a week without the need for cough suppression GERD diet  Return if not all better in 2 weeks with all active medications in hand - and next flare see Debra Atkinson asap once flare starts      06/24/2014 f/u ov/Wert re: severe chronic cough since mid august 2015 / no meds on hand  Chief Complaint  Patient presents with  . Follow-up    pt states once she stopped taking her meds she began coughing again, coughing since tuesday.  nonprod cough.   not clear at all she followed the instructions but was was "pretty much cough free" for 3 days and then stopped everything and the cough gradually stared back 2 d prior to OV  But not nearly as bad - no vomiting this time nor sign cp with cough rec schedule sinus ct > neg First take delsym two tsp every 12 hours and supplement if needed with  tramadol 50 mg up to 2 every 4 hours to suppress the urge  to cough at all or even clear your throat.  Once you have eliminated the cough for 3 straight days try reducing the tramadol first,  then the delsym as tolerated.   Prednisone 10 mg take  4 each am x 2 days,   2 each am x 2 days,  1 each am x 2 days and stop (this is to eliminate allergies and inflammation from coughing) Protonix (pantoprazole) Take 30-60 min before first meal of the day and Pepcid 20 mg one bedtime plus chlorpheniramine 4 mg x 2 at bedtime (both available over the counter)  until  You return  GERD    Return   2 weeks with all active medications in hand - and next flare see Debra Atkinson asap once flare starts       07/07/2014 f/u ov/Wert re: severe cough since Aug 2015 did not bring meds or stay on gerd rx  Chief Complaint  Patient presents with  .  Follow-up    Pt states that her cough is much better. She has the sensation of mucus in her throat alot.    sensation is persistent day >> noct sensation of too much mucus but cough remains dry and worse with voice use   No obvious day to day or daytime variabilty or assoc   sob  or cp or chest tightness, subjective wheeze overt sinus or hb symptoms. No unusual exp hx or h/o childhood pna/ asthma or knowledge of premature birth.  Sleeping ok without nocturnal  or early am exacerbation  of respiratory  c/o's or need for noct saba. Also denies any obvious fluctuation of symptoms with weather or environmental changes or other aggravating or alleviating factors except as outlined above   Current Medications, Allergies, Complete Past Medical History, Past Surgical History, Family History, and Social History were reviewed in [employer] [facility] record.  ROS  The following are not active complaints unless bolded sore throat, dysphagia, dental problems, itching, sneezing,  nasal congestion or excess/ purulent secretions, ear ache,   fever, chills, sweats, unintended wt loss, pleuritic or exertional cp, hemoptysis,  orthopnea pnd or leg swelling, presyncope, palpitations, heartburn, abdominal pain, anorexia, nausea, vomiting, diarrhea  or change in bowel or urinary habits, change in stools or urine, dysuria,hematuria,  rash, arthralgias, visual complaints, headache, numbness weakness or ataxia or problems with walking or coordination,  change in mood/affect or memory.                         Objective:   Physical Exam  amb wf nad  .  06/24/14          194  > 07/07/2014   193  Wt Readings from Last 3 Encounters:  06/08/14 195 lb 3.2 oz (88.542 kg)  04/09/14 193 lb 4 oz (87.658 kg)  02/18/14 196 lb (88.905 kg)    Vital signs reviewed   HEENT: nl dentition, turbinates, and orophanx. Nl external ear canals without cough reflex   NECK :  without JVD/Nodes/TM/ nl carotid  upstrokes bilaterally   LUNGS: no acc muscle use, clear to A and P bilaterally without cough on insp or exp maneuvers   CV:  RRR  no s3 or murmur or increase in P2, no edema   ABD:  soft and nontender with nl excursion in the supine position. No bruits or organomegaly, bowel sounds nl  MS:  warm without deformities, calf tenderness, cyanosis or clubbing     cxr 06/08/14 No  active cardiopulmonary disease  Recent Labs Lab 06/08/14 1218  HGB 13.6  HCT 41.5  WBC 9.1  PLT 310.0          Assessment & Plan:

## 2014-07-08 ENCOUNTER — Telehealth: Payer: Self-pay | Admitting: Internal Medicine

## 2014-07-08 NOTE — Telephone Encounter (Signed)
Fine though it might be cheaper just to get a box of prilosec to bridge the gap

## 2014-07-08 NOTE — Telephone Encounter (Signed)
07-07-2014 visit AVS:      Patient Instructions      The key to effective treatment for your cough is eliminating the non-stop cycle of cough you're stuck in long enough to let your airway heal completely and then see if there is anything still making you cough once you stop the cough suppression, but this should take no more than 5 days to figure out  First take delsym two tsp every 12 hours and supplement if needed with tramadol 50 mg up to 2 every 4 hours to suppress the urge to cough at all or even clear your throat. Swallowing water or using ice chips/non mint and menthol containing candies (such as lifesavers or sugarless jolly ranchers) are also effective. You should rest your voice and avoid activities that you know make you cough.  Once you have eliminated the cough for 3 straight days try reducing the tramadol first, then the delsym as tolerated.   Prednisone 10 mg take 4 each am x 2 days, 2 each am x 2 days, 1 each am x 2 days and stop (this is to eliminate allergies and inflammation from coughing)  Protonix (pantoprazole) Take 30-60 min before first meal of the day and Pepcid 20 mg one bedtime plus chlorpheniramine 4 mg x 2 at bedtime (both available over the counter) until You return   For drainage during the day take chlortrimeton (chlorpheniramine) 4 mg every 4 hours available over the counter (may cause drowsiness)   GERD (REFLUX) is an extremely common cause of respiratory symptoms, many times with no significant heartburn at all.   It can be treated with medication, but also with lifestyle changes including avoidance of late meals, excessive alcohol, smoking cessation, and avoid fatty foods, chocolate, peppermint, colas, red wine, and acidic juices such as orange juice.  NO MINT OR MENTHOL PRODUCTS SO NO COUGH DROPS  USE HARD CANDY INSTEAD (jolley ranchers or Stover's or Lifesavers (all available in sugarless versions) NO OIL BASED VITAMINS - use powdered  substitutes.  Return 2 weeks with all active medications in hand      MW, patient will be 4 days short on her RX for Protonix as she follow ups on 07-21-14; are you okay with her having a 30 day Rx for Protonix to cover this and possibly to continue Rx after she is seen in office. Thanks.

## 2014-07-08 NOTE — Telephone Encounter (Signed)
Spoke with patient-she is aware of recs from MW. Nothing more needed at this time.

## 2014-07-12 ENCOUNTER — Emergency Department (HOSPITAL_COMMUNITY): Payer: Commercial Managed Care - HMO

## 2014-07-12 ENCOUNTER — Encounter (HOSPITAL_COMMUNITY): Payer: Self-pay | Admitting: Emergency Medicine

## 2014-07-12 ENCOUNTER — Emergency Department (HOSPITAL_COMMUNITY)
Admission: EM | Admit: 2014-07-12 | Discharge: 2014-07-13 | Disposition: A | Discharge status: Home / Self Care | Payer: Commercial Managed Care - HMO | Attending: Emergency Medicine | Admitting: Emergency Medicine

## 2014-07-12 DIAGNOSIS — E119 Type 2 diabetes mellitus without complications: Secondary | ICD-10-CM | POA: Insufficient documentation

## 2014-07-12 DIAGNOSIS — Z7951 Long term (current) use of inhaled steroids: Secondary | ICD-10-CM | POA: Diagnosis not present

## 2014-07-12 DIAGNOSIS — R112 Nausea with vomiting, unspecified: Secondary | ICD-10-CM | POA: Diagnosis not present

## 2014-07-12 DIAGNOSIS — Z7952 Long term (current) use of systemic steroids: Secondary | ICD-10-CM | POA: Diagnosis not present

## 2014-07-12 DIAGNOSIS — M797 Fibromyalgia: Secondary | ICD-10-CM | POA: Insufficient documentation

## 2014-07-12 DIAGNOSIS — Z79899 Other long term (current) drug therapy: Secondary | ICD-10-CM | POA: Diagnosis not present

## 2014-07-12 DIAGNOSIS — R269 Unspecified abnormalities of gait and mobility: Secondary | ICD-10-CM | POA: Insufficient documentation

## 2014-07-12 DIAGNOSIS — R42 Dizziness and giddiness: Secondary | ICD-10-CM | POA: Diagnosis not present

## 2014-07-12 DIAGNOSIS — Z8619 Personal history of other infectious and parasitic diseases: Secondary | ICD-10-CM | POA: Insufficient documentation

## 2014-07-12 LAB — URINALYSIS, ROUTINE W REFLEX MICROSCOPIC
Bilirubin Urine: NEGATIVE
Glucose, UA: NEGATIVE mg/dL
HGB URINE DIPSTICK: NEGATIVE
Ketones, ur: NEGATIVE mg/dL
Nitrite: NEGATIVE
Protein, ur: NEGATIVE mg/dL
Specific Gravity, Urine: 1.012 (ref 1.005–1.030)
Urobilinogen, UA: 0.2 mg/dL (ref 0.0–1.0)
pH: 7.5 (ref 5.0–8.0)

## 2014-07-12 LAB — CBC WITH DIFFERENTIAL/PLATELET
Basophils Absolute: 0 10*3/uL (ref 0.0–0.1)
Basophils Relative: 0 % (ref 0–1)
Eosinophils Absolute: 0 10*3/uL (ref 0.0–0.7)
Eosinophils Relative: 0 % (ref 0–5)
HCT: 41.4 % (ref 36.0–46.0)
Hemoglobin: 13.3 g/dL (ref 12.0–15.0)
LYMPHS ABS: 2.5 10*3/uL (ref 0.7–4.0)
Lymphocytes Relative: 27 % (ref 12–46)
MCH: 28 pg (ref 26.0–34.0)
MCHC: 32.1 g/dL (ref 30.0–36.0)
MCV: 87.2 fL (ref 78.0–100.0)
MONOS PCT: 8 % (ref 3–12)
Monocytes Absolute: 0.8 10*3/uL (ref 0.1–1.0)
NEUTROS PCT: 65 % (ref 43–77)
Neutro Abs: 6.2 10*3/uL (ref 1.7–7.7)
Platelets: 303 10*3/uL (ref 150–400)
RBC: 4.75 MIL/uL (ref 3.87–5.11)
RDW: 13.5 % (ref 11.5–15.5)
WBC: 9.6 10*3/uL (ref 4.0–10.5)

## 2014-07-12 LAB — COMPREHENSIVE METABOLIC PANEL
ALT: 22 U/L (ref 0–35)
AST: 21 U/L (ref 0–37)
Albumin: 3.7 g/dL (ref 3.5–5.2)
Alkaline Phosphatase: 74 U/L (ref 39–117)
Anion gap: 6 (ref 5–15)
BILIRUBIN TOTAL: 0.7 mg/dL (ref 0.3–1.2)
BUN: 13 mg/dL (ref 6–23)
CO2: 30 mmol/L (ref 19–32)
Calcium: 9.1 mg/dL (ref 8.4–10.5)
Chloride: 103 mEq/L (ref 96–112)
Creatinine, Ser: 0.89 mg/dL (ref 0.50–1.10)
GFR calc Af Amer: 75 mL/min — ABNORMAL LOW (ref >90)
GFR calc non Af Amer: 65 mL/min — ABNORMAL LOW (ref >90)
Glucose, Bld: 150 mg/dL — ABNORMAL HIGH (ref 70–99)
POTASSIUM: 3.7 mmol/L (ref 3.5–5.1)
SODIUM: 139 mmol/L (ref 135–145)
Total Protein: 6.5 g/dL (ref 6.0–8.3)

## 2014-07-12 LAB — URINE MICROSCOPIC-ADD ON

## 2014-07-12 LAB — I-STAT TROPONIN, ED: Troponin i, poc: 0 ng/mL (ref 0.00–0.08)

## 2014-07-12 MED ORDER — DIAZEPAM 5 MG PO TABS
5.0000 mg | ORAL_TABLET | Freq: Once | ORAL | Status: AC
  Administered 2014-07-12: 5 mg via ORAL
  Filled 2014-07-12: qty 1

## 2014-07-12 MED ORDER — MECLIZINE HCL 25 MG PO TABS
25.0000 mg | ORAL_TABLET | Freq: Once | ORAL | Status: AC
  Administered 2014-07-12: 25 mg via ORAL
  Filled 2014-07-12: qty 1

## 2014-07-12 NOTE — ED Provider Notes (Signed)
CSN: [id number]     Arrival date & time 07/12/14  1217 History   First MD Initiated Contact with Patient 07/12/14 1543     Chief Complaint  Patient presents with  . Dizziness     (Consider location/radiation/quality/duration/timing/severity/associated sxs/prior Treatment) Patient is a 69 y.o. female presenting with dizziness.  Dizziness Quality:  Room spinning Severity:  Moderate Onset quality:  Sudden Duration:  8 hours Timing:  Constant Progression:  Improving Chronicity:  Recurrent Context comment:  Reports daily vertigo for past 10 months.  today, it didn't resolved quickly with rest like it usually does. Relieved by:  Lying down Worsened by:  Movement, turning head and sitting upright Ineffective treatments:  None tried Associated symptoms: nausea and vomiting     Past Medical History  Diagnosis Date  . Varicella   . Diabetes mellitus     diet management  . Genital warts     treated podophyllin  . Hyperlipidemia     diet managed  . Concussion '06, '11  . Fibromyalgia 10/08/2013   Past Surgical History  Procedure Laterality Date  . Cholecystectomy  1979    laparotomy  . Appendectomy  1979  . Cesarean section  1978   Family History  Problem Relation Age of Onset  . Diabetes Mother   . Heart disease Mother     CHF  . Cancer Mother 30    colon cancer  . COPD Mother   . Heart disease Father     CAD/MI  . Rheumatic fever Father    History  Substance Use Topics  . Smoking status: Never Smoker   . Smokeless tobacco: Never Used  . Alcohol Use: No   OB History    No data available     Review of Systems  Gastrointestinal: Positive for nausea and vomiting.  Neurological: Positive for dizziness.  All other systems reviewed and are negative.     Allergies  Prednisone  Home Medications   Prior to Admission medications   Medication Sig Start Date End Date Taking? Authorizing Provider  chlorpheniramine (CHLOR-TRIMETON) 4 MG tablet Take 4 mg by mouth  2 (two) times daily as needed for allergies.   Yes Historical Provider, MD  dextromethorphan (DELSYM) 30 MG/5ML liquid Take 10 mLs by mouth 2 (two) times daily as needed for cough.   Yes Historical Provider, MD  famotidine (PEPCID) 20 MG tablet One at bedtime 06/08/14  Yes Tanda Rockers, MD  fluticasone [facility]) 50 MCG/ACT nasal spray Place 2 sprays into both nostrils daily. 04/27/14  Yes Biagio Borg, MD  ibuprofen (ADVIL,MOTRIN) 200 MG tablet Take 200 mg by mouth every 6 (six) hours as needed.   Yes Historical Provider, MD  pantoprazole (PROTONIX) 40 MG tablet Take 1 tablet (40 mg total) by mouth daily. Take 30-60 min before first meal of the day 06/08/14  Yes Tanda Rockers, MD  predniSONE (DELTASONE) 10 MG tablet Take  4 each am x 2 days,   2 each am x 2 days,  1 each am x 2 days and stop 07/07/14  Yes Tanda Rockers, MD  traMADol (ULTRAM) 50 MG tablet 1-2 every 4 hours as needed for cough or pain 06/24/14  Yes Tanda Rockers, MD   BP 128/77 mmHg  Pulse 71  Temp(Src) 97.8 F (36.6 C) (Oral)  Resp 11  SpO2 99% Physical Exam  Constitutional: She is oriented to person, place, and time. She appears well-developed and well-nourished. No distress.  HENT:  Head: Normocephalic  and atraumatic.  Mouth/Throat: Oropharynx is clear and moist.  Eyes: Conjunctivae are normal. Pupils are equal, round, and reactive to light. No scleral icterus.  Neck: Neck supple.  Cardiovascular: Normal rate, regular rhythm, normal heart sounds and intact distal pulses.   No murmur heard. Pulmonary/Chest: Effort normal and breath sounds normal. No stridor. No respiratory distress. She has no rales.  Abdominal: Soft. Bowel sounds are normal. She exhibits no distension. There is no tenderness.  Musculoskeletal: Normal range of motion.  Neurological: She is alert and oriented to person, place, and time. She has normal strength. No cranial nerve deficit or sensory deficit. Gait (slightly unsteady without gross ataxia)  abnormal. Coordination normal. GCS eye subscore is 4. GCS verbal subscore is 5. GCS motor subscore is 6.  No truncal ataxia No nystagmus  Skin: Skin is warm and dry. No rash noted.  Psychiatric: She has a normal mood and affect. Her behavior is normal.  Nursing note and vitals reviewed.   ED Course  Procedures (including critical care time) Labs Review Labs Reviewed  COMPREHENSIVE METABOLIC PANEL - Abnormal; Notable for the following:    Glucose, Bld 150 (*)    GFR calc non Af Amer 65 (*)    GFR calc Af Amer 75 (*)    All other components within normal limits  URINALYSIS, ROUTINE W REFLEX MICROSCOPIC - Abnormal; Notable for the following:    Leukocytes, UA SMALL (*)    All other components within normal limits  URINE MICROSCOPIC-ADD ON - Abnormal; Notable for the following:    Squamous Epithelial / LPF FEW (*)    Bacteria, UA FEW (*)    All other components within normal limits  CBC WITH DIFFERENTIAL  I-STAT TROPOININ, ED    Imaging Review Ct Head Wo Contrast  07/12/2014   CLINICAL DATA:  69 year old diabetic female with hyperlipidemia and history of concussions presenting with dizziness for months since fall in March. Symptoms worse upon walking this morning with room spanning a and nausea/ vomiting. Initial encounter.  EXAM: CT HEAD WITHOUT CONTRAST  TECHNIQUE: Contiguous axial images were obtained from the base of the skull through the vertex without intravenous contrast.  COMPARISON:  04/11/2010.  FINDINGS: No intracranial hemorrhage.  Moderate small vessel disease type changes.  Question interval development of small medulla infarct versus artifact.  No hydrocephalus.  No intracranial mass lesion noted on this unenhanced exam.  Vascular calcifications.  Minimal polypoid opacification anterior aspect right maxillary sinus.  IMPRESSION: No intracranial hemorrhage.  Moderate small vessel disease type changes.  Question interval development of small medulla infarct versus artifact.    Electronically Signed   By: Chauncey Cruel M.D.   On: 07/12/2014 18:32   Mr Brain Wo Contrast  07/12/2014   CLINICAL DATA:  Several month history of dizziness since fall last March. Vertigo. Initial encounter.  EXAM: MRI HEAD WITHOUT CONTRAST  TECHNIQUE: Multiplanar, multiecho pulse sequences of the brain and surrounding structures were obtained without intravenous contrast.  COMPARISON:  Prior CT performed earlier on the same day.  FINDINGS: Mild diffuse prominence of the CSF containing spaces is compatible with generalized cerebral atrophy. Patchy and confluent T2/FLAIR hyperintensity within the periventricular and deep white matter both cerebral hemispheres noted, nonspecific, but most likely related to moderate chronic microvascular ischemic disease.  No abnormal foci of restricted diffusion to suggest acute intracranial infarct. Gray-white matter differentiation maintained. Normal intravascular flow voids are present. There is no intracranial hemorrhage.  No mass lesion, mass effect, or midline shift. No  hydrocephalus. No extra-axial fluid collection.  Craniocervical junction within normal limits. Visualized upper cervical spine unremarkable. Pituitary gland normal. No acute abnormality seen about the orbits.  Paranasal sinuses are clear. Tiny retention cyst noted within the right maxillary sinus. Minimal scattered fluid signal intensity noted within the inferior right mastoid air cells. Middle ear cavities are clear.  Bone marrow signal intensity is normal. Scalp soft tissues within normal limits.  IMPRESSION: 1. No acute intracranial infarct or other abnormality identified. 2. 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   Electronically Signed   By: Jeannine Boga M.D.   On: 07/12/2014 23:35     EKG Interpretation   Date/Time:  Monday July 12 2014 12:35:00 EST Ventricular Rate:  69 PR Interval:  180 QRS Duration: 74 QT  Interval:  378 QTC Calculation: 405 R Axis:   22 Text Interpretation:  Normal sinus rhythm Normal ECG No significant change  was found Confirmed by [facility]  MD, TREY ([id number]) on 07/12/2014 4:42:42 PM      MDM   Final diagnoses:  Vertigo    69 year old female presenting with vertigo. Symptoms likely represent peripheral vertigo as she has had daily symptoms for past 10 months which are usually fatigueable.  However, she does report a remote fall with head trauma which she thinks marks the beginning of her symptoms.  Her head was not imaged at that time, so will obtain CT head now.  Treat with meclizine.    Meclizine did not seem to help much.  CT showed possible infarct.  MR obtained and was negative for acute infarct.  Given valium for symptoms with some improvement.  Plan dc home with outpatient follow up.    Arbie Cookey, MD 07/13/14 Laureen Abrahams

## 2014-07-12 NOTE — ED Notes (Signed)
Pt sts dizziness x months since fall last March upon standing; pt sts worse upon waking this am with room spinning and N/V

## 2014-07-12 NOTE — ED Notes (Signed)
Pt reports feeling nauseated earlier today but no longer experiencing these symptoms. VSS, family sitting with patient, a&ox4.

## 2014-07-12 NOTE — ED Notes (Signed)
Pt still at MRI

## 2014-07-13 MED ORDER — DIAZEPAM 5 MG PO TABS: 5.0000 mg | ORAL_TABLET | Freq: Three times a day (TID) | ORAL | Status: DC | PRN

## 2014-07-13 NOTE — Discharge Instructions (Signed)

## 2014-07-15 ENCOUNTER — Ambulatory Visit: Payer: Commercial Managed Care - HMO | Admitting: Neurology

## 2014-07-16 ENCOUNTER — Encounter: Payer: Self-pay | Admitting: Internal Medicine

## 2014-07-16 ENCOUNTER — Ambulatory Visit (INDEPENDENT_AMBULATORY_CARE_PROVIDER_SITE_OTHER): Payer: Commercial Managed Care - HMO | Admitting: Internal Medicine

## 2014-07-16 ENCOUNTER — Other Ambulatory Visit (INDEPENDENT_AMBULATORY_CARE_PROVIDER_SITE_OTHER): Payer: Commercial Managed Care - HMO

## 2014-07-16 VITALS — BP 118/74 | HR 83 | Temp 99.0°F | Ht 61.5 in | Wt 194.1 lb

## 2014-07-16 DIAGNOSIS — J069 Acute upper respiratory infection, unspecified: Secondary | ICD-10-CM

## 2014-07-16 DIAGNOSIS — R062 Wheezing: Secondary | ICD-10-CM

## 2014-07-16 DIAGNOSIS — J209 Acute bronchitis, unspecified: Secondary | ICD-10-CM

## 2014-07-16 DIAGNOSIS — R350 Frequency of micturition: Secondary | ICD-10-CM | POA: Diagnosis not present

## 2014-07-16 DIAGNOSIS — E119 Type 2 diabetes mellitus without complications: Secondary | ICD-10-CM

## 2014-07-16 LAB — URINALYSIS, ROUTINE W REFLEX MICROSCOPIC
BILIRUBIN URINE: NEGATIVE
HGB URINE DIPSTICK: NEGATIVE
Ketones, ur: NEGATIVE
Leukocytes, UA: NEGATIVE
Nitrite: NEGATIVE
PH: 5.5 (ref 5.0–8.0)
Specific Gravity, Urine: <=1.005 — AB (ref 1.000–1.030)
Total Protein, Urine: NEGATIVE
URINE GLUCOSE: NEGATIVE
UROBILINOGEN UA: 0.2 (ref 0.0–1.0)

## 2014-07-16 MED ORDER — LEVOFLOXACIN 250 MG PO TABS: 250.0000 mg | ORAL_TABLET | Freq: Every day | ORAL | Status: DC

## 2014-07-16 MED ORDER — HYDROCODONE-HOMATROPINE 5-1.5 MG/5ML PO SYRP: 5.0000 mL | ORAL_SOLUTION | Freq: Four times a day (QID) | ORAL | Status: DC | PRN

## 2014-07-16 MED ORDER — METHYLPREDNISOLONE ACETATE 80 MG/ML IJ SUSP: 80.0000 mg | Freq: Once | INTRAMUSCULAR | Status: DC

## 2014-07-16 MED ORDER — METHYLPREDNISOLONE 4 MG PO KIT: PACK | ORAL | Status: DC

## 2014-07-16 NOTE — Patient Instructions (Addendum)
Please take all new medication as prescribed - the antibiotic  Please continue all other medications as before, and refills have been done if requested.  Please have the pharmacy call with any other refills you may need.  Please keep your appointments with your specialists as you may have planned - Neurology on Jan 25  Please go to the LAB in the Basement (turn left off the elevator) for the tests to be done today - just the urine testing today  You will be contacted by phone if any changes need to be made immediately.  Otherwise, you will receive a letter about your results with an explanation, but please check with MyChart first.

## 2014-07-16 NOTE — Progress Notes (Signed)
Subjective:    Patient ID: Debra Atkinson, female    DOB: 05/28/1946, 69 y.o.   MRN: [id number]  HPI  Here for f/u ER visit with sudden onset positional vertigo, MRI brain neg for acute, tx with meclizine (states did not improve) and valium, only took 1 but seemed to help.  N/V resolved but still mild dizziness, makes her walk off balance.  C/o recent URI symtpoms last wk now improved but mild persistent. But also with urinary freq, feels feverish last 3 days as well, without flank pain, chills, hematuria.   Past Medical History  Diagnosis Date  . Varicella   . Diabetes mellitus     diet management  . Genital warts     treated podophyllin  . Hyperlipidemia     diet managed  . Concussion '06, '11  . Fibromyalgia 10/08/2013   Past Surgical History  Procedure Laterality Date  . Cholecystectomy  1979    laparotomy  . Appendectomy  1979  . Cesarean section  1978    reports that she has never smoked. She has never used smokeless tobacco. She reports that she does not drink alcohol or use illicit drugs. family history includes COPD in her mother; Cancer (age of onset: 1) in her mother; Diabetes in her mother; Heart disease in her father and mother; Rheumatic fever in her father. Allergies  Allergen Reactions  . Prednisone Itching    Redness from injection   Current Outpatient Prescriptions on File Prior to Visit  Medication Sig Dispense Refill  . chlorpheniramine (CHLOR-TRIMETON) 4 MG tablet Take 4 mg by mouth 2 (two) times daily as needed for allergies.    [name] dextromethorphan (DELSYM) 30 MG/5ML liquid Take 10 mLs by mouth 2 (two) times daily as needed for cough.    . diazepam (VALIUM) 5 MG tablet Take 1 tablet (5 mg total) by mouth every 8 (eight) hours as needed (dizziness). 15 tablet 0  . famotidine (PEPCID) 20 MG tablet One at bedtime 30 tablet 2  . fluticasone (FLONASE) 50 MCG/ACT nasal spray Place 2 sprays into both nostrils daily. 16 g 5  . ibuprofen (ADVIL,MOTRIN) 200 MG  tablet Take 200 mg by mouth every 6 (six) hours as needed.    . pantoprazole (PROTONIX) 40 MG tablet Take 1 tablet (40 mg total) by mouth daily. Take 30-60 min before first meal of the day 30 tablet 2  . traMADol (ULTRAM) 50 MG tablet 1-2 every 4 hours as needed for cough or pain 40 tablet 0   No current facility-administered medications on file prior to visit.    Review of Systems  Constitutional: Negative for unusual diaphoresis or other sweats  HENT: Negative for ringing in ear Eyes: Negative for double vision or worsening visual disturbance.  Respiratory: Negative for choking and stridor.   Gastrointestinal: Negative for vomiting or other signifcant bowel change Genitourinary: Negative for hematuria or decreased urine volume.  Musculoskeletal: Negative for other MSK pain or swelling Skin: Negative for color change and worsening wound.  Neurological: Negative for tremors and numbness other than noted  Psychiatric/Behavioral: Negative for decreased concentration or agitation other than above       Objective:   Physical Exam BP 118/74 mmHg  Pulse 83  Temp(Src) 99 F (37.2 C) (Oral)  Ht 5' 1.5" (1.562 m)  Wt 194 lb 2 oz (88.055 kg)  BMI 36.09 kg/m2  SpO2 98% VS noted, nontoxic Constitutional: Pt appears well-developed, well-nourished.  HENT: Head: NCAT.  Right Ear: External ear  normal.  Left Ear: External ear normal.  Bilat tm's with mild erythema.  Max sinus areas mild tender.  Pharynx with mild erythema, no exudate Eyes: . Pupils are equal, round, and reactive to light. Conjunctivae and EOM are normal Neck: Normal range of motion. Neck supple.  Cardiovascular: Normal rate and regular rhythm.   Pulmonary/Chest: Effort normal and breath sounds without rales or wheezing.  Abd:  Soft, NT, ND, + BS Neurological: Pt is alert. Not confused , motor grossly intact Skin: Skin is warm. No rash Psychiatric: Pt behavior is normal. No agitation.     Assessment & Plan:

## 2014-07-16 NOTE — Progress Notes (Signed)
Pre visit review using our clinic review tool, if applicable. No additional management support is needed unless otherwise documented below in the visit note. 

## 2014-07-17 NOTE — Assessment & Plan Note (Signed)
stable overall by history and exam, recent data reviewed with pt, and pt to continue medical treatment as before,  to f/u any worsening symptoms or concerns Lab Results  Component Value Date   HGBA1C 6.0 04/09/2014

## 2014-07-17 NOTE — Assessment & Plan Note (Signed)
?   uti - for urine studies, antibx as above,  to f/u any worsening symptoms or concerns

## 2014-07-17 NOTE — Assessment & Plan Note (Signed)
Mild to mod, for antibx course,  to f/u any worsening symptoms or concerns 

## 2014-07-18 LAB — URINE CULTURE: Colony Count: 7000

## 2014-07-21 ENCOUNTER — Encounter: Payer: Self-pay | Admitting: Internal Medicine

## 2014-07-21 ENCOUNTER — Ambulatory Visit (INDEPENDENT_AMBULATORY_CARE_PROVIDER_SITE_OTHER): Payer: Commercial Managed Care - HMO | Admitting: Internal Medicine

## 2014-07-21 VITALS — BP 140/80 | HR 80 | Temp 98.1°F | Ht 61.5 in | Wt 193.0 lb

## 2014-07-21 DIAGNOSIS — R05 Cough: Secondary | ICD-10-CM

## 2014-07-21 DIAGNOSIS — R058 Other specified cough: Secondary | ICD-10-CM

## 2014-07-21 MED ORDER — GABAPENTIN 100 MG PO CAPS: 100.0000 mg | ORAL_CAPSULE | Freq: Three times a day (TID) | ORAL | Status: DC

## 2014-07-21 MED ORDER — PANTOPRAZOLE SODIUM 40 MG PO TBEC: 40.0000 mg | DELAYED_RELEASE_TABLET | Freq: Every day | ORAL | Status: DC

## 2014-07-21 NOTE — Progress Notes (Signed)
Subjective:    Patient ID: Debra Atkinson, female    DOB: 1945/08/10,    MRN: [id number]    Brief patient profile:  51 yowf never smoked with recurrent pattern of sore throats starting [employer] saw Dr Bernita Buffy in her 71s with abx and steroid shots maybe spring = fall assoc with nasal /chest congestion fine in between these acute symptoms  But since mid august 2015 developed another flare this time not completely resolved with lingering  sense of nasal / chest congesiton with neg w/u Dr Lucia Gaskins referred 06/08/2014 to pulmonary for persistent / worsening cough.     06/08/2014 [facility] office visit/ Jullie Arps   Chief Complaint  Patient presents with  . Pulmonary Consult    Referred by Dr. Cathlean Cower. Pt c/o chest congestion and occ CP since Oct 2015. She c/o cough and sore throat for the past 3 days. Cough is occ prod with minimal sputum-? color. She had fever of 104 and vomiting 1 day ago.   severe coughing fits to point of vomiting is new as is midline cp only with severe cough, anterior and the length of her sternum s radiation or nausea/ diaphoresis not made worse walking, just while coughing - no resp so far to her usual rx with abx/ pred/ decongestants and antihistamines [name] since 1980s if talks and walks not worse since onset of cough rec Augmentin 875 mg take one pill twice daily  X 10 days   First take delsym two tsp every 12 hours and supplement if needed with  tramadol 50 mg up to 2 every 4 hours to suppress the urge to cough at all or even clear your throat. Swallowing water or using ice chips/non mint and menthol containing candies (such as lifesavers or sugarless jolly ranchers) are also effective.  You should rest your voice and avoid activities that you know make you cough. Once you have eliminated the cough for 3 straight days try reducing the tramadol first,  then the delsym as tolerated.   Prednisone 10 mg take  4 each am x 2 days,   2 each am x 2 days,   1 each am x 2 days and stop (this is to eliminate allergies and inflammation from coughing) Protonix (pantoprazole) Take 30-60 min before first meal of the day and Pepcid 20 mg one bedtime plus chlorpheniramine 4 mg x 2 at bedtime (both available over the counter)  until cough is completely gone for at least a week without the need for cough suppression GERD diet  Return if not all better in 2 weeks with all active medications in hand - and next flare see [place] asap once flare starts      06/24/2014 f/u ov/Bodey Frizell re: cough since mid august 2015 / no meds on hand  Chief Complaint  Patient presents with  . Follow-up    pt states once she stopped taking her meds she began coughing again, coughing since tuesday.  nonprod cough.   not clear at all she followed the instructions but was was "pretty much cough free" for 3 days and then stopped everything and the cough gradually stared back 2 d prior to OV  But not nearly as bad - no vomiting this time nor sign cp with cough rec schedule sinus ct > neg First take delsym two tsp every 12 hours and supplement if needed with  tramadol 50 mg up to 2 every 4 hours to suppress the urge to cough  at all or even clear your throat.  Once you have eliminated the cough for 3 straight days try reducing the tramadol first,  then the delsym as tolerated.   Prednisone 10 mg take  4 each am x 2 days,   2 each am x 2 days,  1 each am x 2 days and stop (this is to eliminate allergies and inflammation from coughing) Protonix (pantoprazole) Take 30-60 min before first meal of the day and Pepcid 20 mg one bedtime plus chlorpheniramine 4 mg x 2 at bedtime (both available over the counter)  until  You return  GERD    Return   2 weeks with all active medications in hand - and next flare see [place] as[place] once flare starts       07/07/2014 f/u ov/[name] re: cough since Aug 2015 did not bring meds or stay on gerd rx  Chief Complaint  Patient presents with  . Follow-up    Pt states  that her cough is much better. She has the sensation of mucus in her throat alot.      rec First take delsym two tsp every 12 hours and supplement if needed with  tramadol 50 mg up to 2 every 4 hours t  Once you have eliminated the cough for 3 straight days try reducing the tramadol first,  then the delsym as tolerated.   Prednisone 10 mg take  4 each am x 2 days,   2 each am x 2 days,  1 each am x 2 days and stop (this is to eliminate allergies and inflammation from coughing) Protonix (pantoprazole) Take 30-60 min before first meal of the day and Pepcid 20 mg one bedtime plus chlorpheniramine 4 mg x 2 at bedtime (both available over the counter)  until  You return  For drainage during the day  take chlortrimeton (chlorpheniramine) 4 mg every 4 hours available over the counter (may cause drowsiness)  GERD  Diet   Return   2 weeks with all active medications in hand    07/21/2014 f/u ov/[name] re: variably severe cough since Aug 2015 / did not bring all meds  Chief Complaint  Patient presents with  . Follow-up    Pt states her cough continues to improve. She c/o some nasal congestion and discomfort in her ears today- she was recently txed with levaquin by her PCP.    Urge to clear throat is variable during the day, mostly with voice use,  and does not disturb sleep    No obvious day to day or daytime variabilty or assoc  sob  or cp or chest tightness, subjective wheeze overt  hb symptoms. No unusual exp hx or h/o childhood pna/ asthma or knowledge of premature birth.  Sleeping ok without nocturnal  or early am exacerbation  of respiratory  c/o's or need for noct saba. Also denies any obvious fluctuation of symptoms with weather or environmental changes or other aggravating or alleviating factors except as outlined above   Current Medications, Allergies, Complete Past Medical History, Past Surgical History, Family History, and Social History were reviewed in [employer]  record.  ROS  The following are not active complaints unless bolded sore throat, dysphagia, dental problems, itching, sneezing,  nasal congestion or excess/ purulent secretions, ear ache,   fever, chills, sweats, unintended wt loss, pleuritic or exertional cp, hemoptysis,  orthopnea pnd or leg swelling, presyncope, palpitations, heartburn, abdominal pain, anorexia, nausea, vomiting, diarrhea  or change in bowel or  urinary habits, change in stools or urine, dysuria,hematuria,  rash, arthralgias, visual complaints, headache, numbness weakness or ataxia or problems with walking or coordination,  change in mood/affect or memory.                         Objective:   Physical Exam  amb wf nad  .  06/24/14          194  > 07/07/2014   193 > 07/21/2014  193 Wt Readings from Last 3 Encounters:  06/08/14 195 lb 3.2 oz (88.542 kg)  04/09/14 193 lb 4 oz (87.658 kg)  02/18/14 196 lb (88.905 kg)    Vital signs reviewed   HEENT: nl dentition, turbinates, and orophanx. Nl external ear canals without cough reflex   NECK :  without JVD/Nodes/TM/ nl carotid upstrokes bilaterally   LUNGS: no acc muscle use, clear to A and P bilaterally without cough on insp or exp maneuvers   CV:  RRR  no s3 or murmur or increase in P2, no edema   ABD:  soft and nontender with nl excursion in the supine position. No bruits or organomegaly, bowel sounds nl  MS:  warm without deformities, calf tenderness, cyanosis or clubbing     cxr 06/08/14 No active cardiopulmonary disease  Recent Labs Lab 06/08/14 1218  HGB 13.6  HCT 41.5  WBC 9.1  PLT 310.0          Assessment & Plan:

## 2014-07-21 NOTE — Patient Instructions (Signed)
Pantoprazole (protonix) 40 mg   Take 30-60 min before first meal of the day and Pepcid 20 mg one bedtime until no coughing at all for several weeks  Gabapentin 100 mg three times daily   For drainage take chlortrimeton (chlorpheniramine) 4 mg every 4 hours available over the counter (may cause drowsiness)   Please schedule a follow up office visit in 6 weeks, call sooner if needed

## 2014-07-22 ENCOUNTER — Encounter: Payer: Self-pay | Admitting: *Deleted

## 2014-07-22 ENCOUNTER — Encounter: Payer: Self-pay | Admitting: Internal Medicine

## 2014-07-22 NOTE — Assessment & Plan Note (Addendum)
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     Each maintenance medication was reviewed in detail including most importantly the difference between maintenance and as needed and under what circumstances the prns are to be used.  Please see instructions for details which were reviewed in writing and the patient given a copy.

## 2014-07-22 NOTE — Assessment & Plan Note (Signed)
-   Allergy profile 06/08/2014 > no Eos, IgE 9 and RAST neg   I had an extended discussion with the patient reviewing all relevant studies completed to date and  lasting 15 to 20 minutes of a 25 minute visit on the following ongoing concerns:  The standardized cough guidelines published in Chest by Lissa Morales in 2006 are still the best available and consist of a multiple step process (up to 12!) , not a single office visit,  and are intended  to address this problem logically,  with an alogrithm dependent on response to empiric treatment at  each progressive step  to determine a specific diagnosis with  minimal addtional testing needed. Therefore if adherence is an issue or can't be accurately verified,  it's very unlikely the standard evaluation and treatment will be successful here.    Furthermore, response to therapy (other than acute cough suppression, which should only be used short term with avoidance of narcotic containing cough syrups if possible), can be a gradual process for which the patient may perceive immediate benefit.  Unlike going to an eye doctor where the best perscription is almost always the first one and is immediately effective, this is almost never the case in the management of chronic cough syndromes. Therefore the patient needs to commit up front to consistently adhere to recommendations  for up to 6 weeks of therapy directed at the likely underlying problem(s) before the response can be reasonably evaluated.   Will rechallenge with cyclical cough protocol and regroup in 2 weeks with all meds in hand

## 2014-07-26 ENCOUNTER — Ambulatory Visit (INDEPENDENT_AMBULATORY_CARE_PROVIDER_SITE_OTHER): Payer: Commercial Managed Care - HMO | Admitting: Neurology

## 2014-07-26 ENCOUNTER — Encounter: Payer: Self-pay | Admitting: Neurology

## 2014-07-26 VITALS — BP 146/84 | HR 88 | Temp 98.7°F | Resp 18 | Ht 61.0 in | Wt 194.5 lb

## 2014-07-26 DIAGNOSIS — I679 Cerebrovascular disease, unspecified: Secondary | ICD-10-CM

## 2014-07-26 DIAGNOSIS — IMO0002 Reserved for concepts with insufficient information to code with codable children: Secondary | ICD-10-CM

## 2014-07-26 DIAGNOSIS — H8110 Benign paroxysmal vertigo, unspecified ear: Secondary | ICD-10-CM | POA: Diagnosis not present

## 2014-07-26 DIAGNOSIS — R42 Dizziness and giddiness: Secondary | ICD-10-CM
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
## 2014-07-26 NOTE — Patient Instructions (Signed)
For the dizziness, the best treatment would be vestibular rehabilitation, as performed by a physical therapist.  We will refer you. Consider starting a baby aspirin 81mg  daily given the vascular changes found on the brain imaging. I would continue with the statin Limit use of meclizine (do not use if possible) Follow up in 2 months.

## 2014-09-01 ENCOUNTER — Encounter: Payer: Self-pay | Admitting: Internal Medicine

## 2014-09-01 ENCOUNTER — Ambulatory Visit (INDEPENDENT_AMBULATORY_CARE_PROVIDER_SITE_OTHER): Payer: Commercial Managed Care - HMO | Admitting: Internal Medicine

## 2014-09-01 VITALS — BP 120/78 | HR 78 | Ht 62.0 in | Wt 194.0 lb

## 2014-09-01 DIAGNOSIS — R05 Cough: Secondary | ICD-10-CM | POA: Diagnosis not present

## 2014-09-01 DIAGNOSIS — R058 Other specified cough: Secondary | ICD-10-CM

## 2014-09-01 NOTE — Progress Notes (Signed)
Subjective:    Patient ID: Debra Atkinson, female    DOB: 15-May-1946,    MRN: [id number]    Brief patient profile:  89 yowf never smoked with recurrent pattern of sore throats starting [employer] school saw Dr Bernita Buffy in her 54s with abx and steroid shots maybe spring = fall assoc with nasal /chest congestion fine in between these acute symptoms  But since mid august 2015 developed another flare this time not completely resolved with lingering  sense of nasal / chest congesiton with neg w/u Dr Lucia Gaskins referred 06/08/2014 to pulmonary for persistent / worsening cough.    History of Present Illness  06/08/2014 1st [facility] office visit/ Debra Atkinson   Chief Complaint  Patient presents with  . Pulmonary Consult    Referred by Dr. Cathlean Cower. Pt c/o chest congestion and occ CP since Oct 2015. She c/o cough and sore throat for the past 3 days. Cough is occ prod with minimal sputum-? color. She had fever of 104 and vomiting 1 day ago.   severe coughing fits to point of vomiting is new as is midline cp only with severe cough, anterior and the length of her sternum s radiation or nausea/ diaphoresis not made worse walking, just while coughing - no resp so far to her usual rx with abx/ pred/ decongestants and antihistamines [name] since 1980s if talks and walks not worse since onset of cough rec Augmentin 875 mg take one pill twice daily  X 10 days   First take delsym two tsp every 12 hours and supplement if needed with  tramadol 50 mg up to 2 every 4 hours to suppress the urge to cough at all or even clear your throat. Swallowing water or using ice chips/non mint and menthol containing candies (such as lifesavers or sugarless jolly ranchers) are also effective.  You should rest your voice and avoid activities that you know make you cough. Once you have eliminated the cough for 3 straight days try reducing the tramadol first,  then the delsym as tolerated.   Prednisone 10 mg take  4 each am x 2  days,   2 each am x 2 days,  1 each am x 2 days and stop (this is to eliminate allergies and inflammation from coughing) Protonix (pantoprazole) Take 30-60 min before first meal of the day and Pepcid 20 mg one bedtime plus chlorpheniramine 4 mg x 2 at bedtime (both available over the counter)  until cough is completely gone for at least a week without the need for cough suppression GERD diet  Return if not all better in 2 weeks with all active medications in hand - and next flare see [place] asap once flare starts      06/24/2014 f/u ov/[name] re: cough since mid august 2015 / no meds on hand  Chief Complaint  Patient presents with  . Follow-up    pt states once she stopped taking her meds she began coughing again, coughing since tuesday.  nonprod cough.   not clear at all she followed the instructions but was was "pretty much cough free" for 3 days and then stopped everything and the cough gradually stared back 2 d prior to OV  But not nearly as bad - no vomiting this time nor sign cp with cough rec schedule sinus ct > neg First take delsym two tsp every 12 hours and supplement if needed with  tramadol 50 mg up to 2 every 4 hours to suppress  the urge to cough at all or even clear your throat.  Once you have eliminated the cough for 3 straight days try reducing the tramadol first,  then the delsym as tolerated.   Prednisone 10 mg take  4 each am x 2 days,   2 each am x 2 days,  1 each am x 2 days and stop (this is to eliminate allergies and inflammation from coughing) Protonix (pantoprazole) Take 30-60 min before first meal of the day and Pepcid 20 mg one bedtime plus chlorpheniramine 4 mg x 2 at bedtime (both available over the counter)  until  You return  GERD    Return   2 weeks with all active medications in hand - and next flare see [place] asap once flare starts       07/07/2014 f/u ov/Debra Atkinson re: cough since Aug 2015 did not bring meds or stay on gerd rx  Chief Complaint  Patient presents with  .  Follow-up    Pt states that her cough is much better. She has the sensation of mucus in her throat alot.      rec First take delsym two tsp every 12 hours and supplement if needed with  tramadol 50 mg up to 2 every 4 hours t  Once you have eliminated the cough for 3 straight days try reducing the tramadol first,  then the delsym as tolerated.   Prednisone 10 mg take  4 each am x 2 days,   2 each am x 2 days,  1 each am x 2 days and stop (this is to eliminate allergies and inflammation from coughing) Protonix (pantoprazole) Take 30-60 min before first meal of the day and Pepcid 20 mg one bedtime plus chlorpheniramine 4 mg x 2 at bedtime (both available over the counter)  until  You return  For drainage during the day  take chlortrimeton (chlorpheniramine) 4 mg every 4 hours available over the counter (may cause drowsiness)  GERD  Diet   Return   2 weeks with all active medications in hand    07/21/2014 f/u ov/Debra Atkinson re: variably severe cough since Aug 2015 / did not bring all meds  Chief Complaint  Patient presents with  . Follow-up    Pt states her cough continues to improve. She c/o some nasal congestion and discomfort in her ears today- she was recently txed with levaquin by her PCP.  Urge to clear throat is variable during the day, mostly with voice use,  and does not disturb sleep rec Pantoprazole (protonix) 40 mg   Take 30-60 min before first meal of the day and Pepcid 20 mg one bedtime until no coughing at all for several weeks Gabapentin 100 mg three times daily > did not start  For drainage take chlortrimeton (chlorpheniramine) 4 mg every 4 hours available over the counter (may cause drowsiness)    09/01/2014 f/u ov/Debra Atkinson re: chronic cough/ onset Aug 2015 / never took neurontin / maint on [employer] Complaint  Patient presents with  . Follow-up    Pt states that her cough is much improved. No longer taking any meds at this point. No new co's today.   says cough / throat clearing  tolerable and much better than her husband's - day >> noct, never tried the chlortrimeton   No obvious day to day or daytime variabilty or assoc  sob  or cp or chest tightness, subjective wheeze overt  hb symptoms. No unusual exp hx or h/o childhood pna/  asthma or knowledge of premature birth.  Sleeping ok without nocturnal  or early am exacerbation  of respiratory  c/o's or need for noct saba. Also denies any obvious fluctuation of symptoms with weather or environmental changes or other aggravating or alleviating factors except as outlined above   Current Medications, Allergies, Complete Past Medical History, Past Surgical History, Family History, and Social History were reviewed in [facility] record.  ROS  The following are not active complaints unless bolded sore throat, dysphagia, dental problems, itching, sneezing,  nasal congestion or excess/ purulent secretions, ear ache,   fever, chills, sweats, unintended wt loss, pleuritic or exertional cp, hemoptysis,  orthopnea pnd or leg swelling, presyncope, palpitations, heartburn, abdominal pain, anorexia, nausea, vomiting, diarrhea  or change in bowel or urinary habits, change in stools or urine, dysuria,hematuria,  rash, arthralgias, visual complaints, headache, numbness weakness or ataxia or problems with walking or coordination,  change in mood/affect or memory.                    Objective:   Physical Exam  amb wf nad  .  06/24/14          194  > 07/07/2014   193 > 07/21/2014  193 > 09/01/2014  194  Wt Readings from Last 3 Encounters:  06/08/14 195 lb 3.2 oz (88.542 kg)  04/09/14 193 lb 4 oz (87.658 kg)  02/18/14 196 lb (88.905 kg)    Vital signs reviewed   HEENT: nl dentition, turbinates, and orophanx. Nl external ear canals without cough reflex   NECK :  without JVD/Nodes/TM/ nl carotid upstrokes bilaterally   LUNGS: no acc muscle use, clear to A and P bilaterally without cough on insp or exp  maneuvers   CV:  RRR  no s3 or murmur or increase in P2, no edema   ABD:  soft and nontender with nl excursion in the supine position. No bruits or organomegaly, bowel sounds nl  MS:  warm without deformities, calf tenderness, cyanosis or clubbing     cxr 06/08/14 No active cardiopulmonary disease  Recent Labs Lab 06/08/14 1218  HGB 13.6  HCT 41.5  WBC 9.1  PLT 310.0          Assessment & Plan:

## 2014-09-01 NOTE — Patient Instructions (Signed)
For drainage take chlortrimeton (chlorpheniramine) 4 mg every 4 hours available over the counter (may cause drowsiness)   For any flare of the cough immediately start Prilosec 20mg   Take 30-60 min before first meal of the day and Pepcid 20 mg one bedtime until cough is completely gone for at least a week without the need for cough suppression    If you are satisfied with your treatment plan,  let your doctor know and he/she can either refill your medications or you can return here when your prescription runs out.     If in any way you are not 100% satisfied,  please tell us.  If 100% better, tell your friends!  Pulmonary follow up is as needed

## 2014-09-02 ENCOUNTER — Encounter: Payer: Self-pay | Admitting: Internal Medicine

## 2014-09-02 NOTE — Assessment & Plan Note (Addendum)
-   Allergy profile 06/08/2014 > no Eos, IgE 9 and RAST neg  - sinus ct > 07/01/2014 neg  - added neurontin 07/21/14 > never took it after she read the potential side effects   I had an extended summary discussion with the patient reviewing all relevant studies completed to date and  lasting 15 to 20 minutes of a 25 minute visit on the following ongoing concerns:   1) uacs is benign and her husband apparently has it too or he would be complaining about her more that he does  2) other than max gerd rx, only options are 1st gen H1, neurontin, and elavil, in that order and with side effect severity also in that order, so she just needs to make sure "the punishment fits the crime" and let us know if she's bothered enough by the cough to return.  3) The standardized cough guidelines published in Chest by Lissa Morales in 2006 are still the best available and consist of a multiple step process (up to 12!) , not a single office visit,  and are intended  to address this problem logically,  with an alogrithm dependent on response to empiric treatment at  each progressive step  to determine a specific diagnosis with  minimal addtional testing needed. Therefore if adherence is an issue or can't be accurately verified,  it's very unlikely the standard evaluation and treatment will be successful here.    Furthermore, response to therapy (other than acute cough suppression, which should only be used short term with avoidance of narcotic containing cough syrups if possible), can be a gradual process for which the patient may perceive immediate benefit.  Unlike going to an eye doctor where the best perscription is almost always the first one and is immediately effective, this is almost never the case in the management of chronic cough syndromes. Therefore the patient needs to commit up front to consistently adhere to recommendations  for up to 6 weeks of therapy directed at the likely underlying problem(s) before the  response can be reasonably evaluated.

## 2014-09-30 ENCOUNTER — Ambulatory Visit (INDEPENDENT_AMBULATORY_CARE_PROVIDER_SITE_OTHER): Payer: Commercial Managed Care - HMO | Admitting: Neurology

## 2014-09-30 ENCOUNTER — Encounter: Payer: Self-pay | Admitting: Neurology

## 2014-09-30 VITALS — BP 130/68 | HR 78 | Resp 18 | Ht 62.0 in | Wt 197.2 lb

## 2014-09-30 DIAGNOSIS — H8113 Benign paroxysmal vertigo, bilateral: Secondary | ICD-10-CM
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
## 2014-09-30 NOTE — Patient Instructions (Signed)
I think it was an inner ear issue triggered by hitting your head.  Hopefully, it won't return.  If it returns, consider seeing the ear, nose and throat doctor first.

## 2014-10-08 ENCOUNTER — Other Ambulatory Visit: Payer: Self-pay | Admitting: Internal Medicine

## 2014-10-08 ENCOUNTER — Other Ambulatory Visit (INDEPENDENT_AMBULATORY_CARE_PROVIDER_SITE_OTHER): Payer: Commercial Managed Care - HMO

## 2014-10-08 ENCOUNTER — Encounter: Payer: Self-pay | Admitting: Internal Medicine

## 2014-10-08 ENCOUNTER — Ambulatory Visit (INDEPENDENT_AMBULATORY_CARE_PROVIDER_SITE_OTHER): Payer: Commercial Managed Care - HMO | Admitting: Internal Medicine

## 2014-10-08 VITALS — BP 124/82 | HR 74 | Temp 98.0°F | Resp 18 | Ht 62.0 in | Wt 196.2 lb

## 2014-10-08 DIAGNOSIS — E119 Type 2 diabetes mellitus without complications: Secondary | ICD-10-CM | POA: Diagnosis not present

## 2014-10-08 DIAGNOSIS — Z0000 Encounter for general adult medical examination without abnormal findings: Secondary | ICD-10-CM

## 2014-10-08 DIAGNOSIS — R0789 Other chest pain: Secondary | ICD-10-CM | POA: Diagnosis not present

## 2014-10-08 DIAGNOSIS — Z23 Encounter for immunization: Secondary | ICD-10-CM

## 2014-10-08 LAB — URINALYSIS, ROUTINE W REFLEX MICROSCOPIC
Bilirubin Urine: NEGATIVE
HGB URINE DIPSTICK: NEGATIVE
Ketones, ur: NEGATIVE
LEUKOCYTES UA: NEGATIVE
NITRITE: NEGATIVE
RBC / HPF: NONE SEEN (ref 0–?)
Specific Gravity, Urine: 1.025 (ref 1.000–1.030)
TOTAL PROTEIN, URINE-UPE24: NEGATIVE
Urine Glucose: NEGATIVE
Urobilinogen, UA: 0.2 (ref 0.0–1.0)
WBC, UA: NONE SEEN (ref 0–?)
pH: 6 (ref 5.0–8.0)

## 2014-10-08 LAB — BASIC METABOLIC PANEL
BUN: 15 mg/dL (ref 6–23)
CALCIUM: 9.8 mg/dL (ref 8.4–10.5)
CHLORIDE: 103 meq/L (ref 96–112)
CO2: 29 meq/L (ref 19–32)
Creatinine, Ser: 0.73 mg/dL (ref 0.40–1.20)
GFR: 84.11 mL/min (ref >60.00)
Glucose, Bld: 101 mg/dL — ABNORMAL HIGH (ref 70–99)
Potassium: 4.4 mEq/L (ref 3.5–5.1)
Sodium: 139 mEq/L (ref 135–145)

## 2014-10-08 LAB — CBC WITH DIFFERENTIAL/PLATELET
Basophils Absolute: 0.1 10*3/uL (ref 0.0–0.1)
Basophils Relative: 0.7 % (ref 0.0–3.0)
Eosinophils Absolute: 0 10*3/uL (ref 0.0–0.7)
Eosinophils Relative: 0.6 % (ref 0.0–5.0)
HEMATOCRIT: 40.5 % (ref 36.0–46.0)
Hemoglobin: 13.6 g/dL (ref 12.0–15.0)
LYMPHS ABS: 3.4 10*3/uL (ref 0.7–4.0)
Lymphocytes Relative: 46.7 % — ABNORMAL HIGH (ref 12.0–46.0)
MCHC: 33.5 g/dL (ref 30.0–36.0)
MCV: 82.4 fl (ref 78.0–100.0)
MONOS PCT: 7.5 % (ref 3.0–12.0)
Monocytes Absolute: 0.5 10*3/uL (ref 0.1–1.0)
Neutro Abs: 3.3 10*3/uL (ref 1.4–7.7)
Neutrophils Relative %: 44.5 % (ref 43.0–77.0)
PLATELETS: 329 10*3/uL (ref 150.0–400.0)
RBC: 4.91 Mil/uL (ref 3.87–5.11)
RDW: 13.6 % (ref 11.5–15.5)
WBC: 7.3 10*3/uL (ref 4.0–10.5)

## 2014-10-08 LAB — LIPID PANEL
CHOL/HDL RATIO: 5
CHOLESTEROL: 270 mg/dL — AB (ref 0–200)
HDL: 52.1 mg/dL (ref >39.00)
LDL Cholesterol: 186 mg/dL — ABNORMAL HIGH (ref 0–99)
NonHDL: 217.9
Triglycerides: 161 mg/dL — ABNORMAL HIGH (ref 0.0–149.0)
VLDL: 32.2 mg/dL (ref 0.0–40.0)

## 2014-10-08 LAB — MICROALBUMIN / CREATININE URINE RATIO
CREATININE, U: 112.8 mg/dL
Microalb Creat Ratio: 0.6 mg/g (ref 0.0–30.0)

## 2014-10-08 LAB — HEPATIC FUNCTION PANEL
ALBUMIN: 4.2 g/dL (ref 3.5–5.2)
ALT: 17 U/L (ref 0–35)
AST: 14 U/L (ref 0–37)
Alkaline Phosphatase: 87 U/L (ref 39–117)
BILIRUBIN DIRECT: 0.1 mg/dL (ref 0.0–0.3)
Total Bilirubin: 0.5 mg/dL (ref 0.2–1.2)
Total Protein: 7.5 g/dL (ref 6.0–8.3)

## 2014-10-08 LAB — TSH: TSH: 2.42 u[IU]/mL (ref 0.35–4.50)

## 2014-10-08 LAB — HEMOGLOBIN A1C: Hgb A1c MFr Bld: 6.2 % (ref 4.6–6.5)

## 2014-10-08 MED ORDER — ATORVASTATIN CALCIUM 20 MG PO TABS: 20.0000 mg | ORAL_TABLET | Freq: Every day | ORAL | Status: DC

## 2014-10-08 NOTE — Assessment & Plan Note (Signed)

## 2014-10-08 NOTE — Progress Notes (Signed)
Pre visit review using our clinic review tool, if applicable. No additional management support is needed unless otherwise documented below in the visit note. 

## 2014-10-08 NOTE — Addendum Note (Signed)
Addended by: Biagio Borg on: 10/08/2014 12:01 PM   Modules accepted: Orders

## 2014-10-08 NOTE — Assessment & Plan Note (Signed)
stable overall by history and exam, recent data reviewed with pt, and pt to continue medical treatment as before,  to f/u any worsening symptoms or concerns Lab Results  Component Value Date   HGBA1C 6.0 04/09/2014

## 2014-10-08 NOTE — Progress Notes (Signed)
Subjective:    Patient ID: Debra Atkinson, female    DOB: [birth year], 69 y.o.   MRN: [id number]  HPI  Here for wellness and f/u;  Overall doing ok;  Pt denies worsening SOB, DOE, wheezing, orthopnea, PND, worsening LE edema, palpitations, dizziness or syncope, but has had 6 mo intermittent sharp anterior CP, without radiatoin, n/v, diaphoriesis, sob.  Seen in ED jan 2016, ECG without acute, pt declines ecg today.  Pt denies neurological change such as new headache, facial or extremity weakness.  Pt denies polydipsia, polyuria, or low sugar symptoms. Pt states overall good compliance with treatment and medications, good tolerability, and has been trying to follow appropriate diet.  Pt denies worsening depressive symptoms, suicidal ideation or panic. No fever, night sweats, wt loss, loss of appetite, or other constitutional symptoms.  Pt states good ability with ADL's, has low fall risk, home safety reviewed and adequate, no other significant changes in hearing or vision, and only occasionally active with exercise. Recent dizzy episode resolved, seen per Dr Tomi Likens.  No other new complaints Past Medical History  Diagnosis Date  . Varicella   . Diabetes mellitus     diet management  . Genital warts     treated podophyllin  . Hyperlipidemia     diet managed  . Concussion '06, '11  . Fibromyalgia 10/08/2013   Past Surgical History  Procedure Laterality Date  . Cholecystectomy  1979    laparotomy  . Appendectomy  1979  . Cesarean section  1978    reports that she has never smoked. She has never used smokeless tobacco. She reports that she does not drink alcohol or use illicit drugs. family history includes COPD in her mother; Cancer (age of onset: 43) in her mother; Diabetes in her maternal grandfather and mother; Heart disease in her father and mother; Rheumatic fever in her father. Allergies  Allergen Reactions  . Prednisone Itching    Redness from injection   Current Outpatient  Prescriptions on File Prior to Visit  Medication Sig Dispense Refill  . chlorpheniramine (CHLOR-TRIMETON) 4 MG tablet Take 4 mg by mouth 2 (two) times daily as needed for allergies.    [name] dextromethorphan (DELSYM) 30 MG/5ML liquid Take 10 mLs by mouth 2 (two) times daily as needed for cough.    . diazepam (VALIUM) 5 MG tablet Take 1 tablet (5 mg total) by mouth every 8 (eight) hours as needed (dizziness). 15 tablet 0  . famotidine (PEPCID) 20 MG tablet One at bedtime 30 tablet 2  . fluticasone (FLONASE) 50 MCG/ACT nasal spray Place 2 sprays into both nostrils daily. 16 g 5  . gabapentin (NEURONTIN) 100 MG capsule Take 1 capsule (100 mg total) by mouth 3 (three) times daily. One three times daily 90 capsule 2  . ibuprofen (ADVIL,MOTRIN) 200 MG tablet Take 200 mg by mouth every 6 (six) hours as needed.    [name] levofloxacin (LEVAQUIN) 250 MG tablet Take 1 tablet (250 mg total) by mouth daily. 10 tablet 0  . meclizine (ANTIVERT) 25 MG tablet Take 25 mg by mouth 3 (three) times daily as needed for dizziness.    . pantoprazole (PROTONIX) 40 MG tablet Take 1 tablet (40 mg total) by mouth daily. Take 30-60 min before first meal of the day 30 tablet 2  . traMADol (ULTRAM) 50 MG tablet 1-2 every 4 hours as needed for cough or pain 40 tablet 0   No current facility-administered medications on file prior to visit.  Review of Systems Constitutional: Negative for increased diaphoresis, other activity, appetite or siginficant weight change other than noted HENT: Negative for worsening hearing loss, ear pain, facial swelling, mouth sores and neck stiffness.   Eyes: Negative for other worsening pain, redness or visual disturbance.  Respiratory: Negative for shortness of breath and wheezing  Cardiovascular: Negative for chest pain and palpitations.  Gastrointestinal: Negative for diarrhea, blood in stool, abdominal distention or other pain Genitourinary: Negative for hematuria, flank pain or change in urine  volume.  Musculoskeletal: Negative for myalgias or other joint complaints.  Skin: Negative for color change and wound or drainage.  Neurological: Negative for syncope and numbness. other than noted Hematological: Negative for adenopathy. or other swelling Psychiatric/Behavioral: Negative for hallucinations, SI, self-injury, decreased concentration or other worsening agitation.      Objective:   Physical Exam BP 124/82 mmHg  Pulse 74  Temp(Src) 98 F (36.7 C) (Oral)  Resp 18  Ht 5\' 2"  (1.575 m)  Wt 196 lb 3.2 oz (88.996 kg)  BMI 35.88 kg/m2  SpO2 95% VS noted,  Constitutional: Pt is oriented to person, place, and time. Appears well-developed and well-nourished, in no significant distress Head: Normocephalic and atraumatic.  Right Ear: External ear normal.  Left Ear: External ear normal.  Nose: Nose normal.  Mouth/Throat: Oropharynx is clear and moist.  Eyes: Conjunctivae and EOM are normal. Pupils are equal, round, and reactive to light.  Neck: Normal range of motion. Neck supple. No JVD present. No tracheal deviation present or significant neck LA or mass Cardiovascular: Normal rate, regular rhythm, normal heart sounds and intact distal pulses.   Pulmonary/Chest: Effort normal and breath sounds without rales or wheezing  Abdominal: Soft. Bowel sounds are normal. NT. No HSM  Musculoskeletal: Normal range of motion. Exhibits no edema.  Lymphadenopathy:  Has no cervical adenopathy.  Neurological: Pt is alert and oriented to person, place, and time. Pt has normal reflexes. No cranial nerve deficit. Motor grossly intact Skin: Skin is warm and dry. No rash noted.  Psychiatric:  Has normal mood and affect. Behavior is normal.     Assessment & Plan:

## 2014-10-08 NOTE — Addendum Note (Signed)
Addended by: Valerie Salts on: 10/08/2014 12:04 PM   Modules accepted: Orders

## 2014-10-08 NOTE — Patient Instructions (Addendum)
You had the new Prevnar pneumonia shot today  Please continue all other medications as before, and refills have been done if requested.  Please have the pharmacy call with any other refills you may need.  Please continue your efforts at being more active, low cholesterol diet, and weight control.  You are otherwise up to date with prevention measures today.  Please keep your appointments with your specialists as you may have planned  You will be contacted regarding the referral for: stress test  Please go to the LAB in the Basement (turn left off the elevator) for the tests to be done today  You will be contacted by phone if any changes need to be made immediately.  Otherwise, you will receive a letter about your results with an explanation, but please check with MyChart first.  Please remember to sign up for MyChart if you have not done so, as this will be important to you in the future with finding out test results, communicating by private email, and scheduling acute appointments online when needed.  Please return in 6 months, or sooner if needed

## 2014-10-08 NOTE — Progress Notes (Signed)
Advised pt of lab value for LDL, also advised Rx lipitor has been called in to pt pharmacy

## 2014-10-08 NOTE — Assessment & Plan Note (Signed)
Mild, for stress test,  to f/u any worsening symptoms or concerns

## 2014-10-26 ENCOUNTER — Telehealth: Payer: Self-pay | Admitting: Internal Medicine

## 2014-10-26 NOTE — Telephone Encounter (Signed)
Patient had last eye exam last fall at [facility]  Patient states she had a call requesting date and Doctor.

## 2014-10-26 NOTE — Telephone Encounter (Signed)
fup on patient call that someone had called regarding her last eye exam;  Call to [facility] and the office confirmed that this mbr had her annual eye exam by Dr. Barbie Haggis on Apr 02, 2014

## 2014-11-09 ENCOUNTER — Other Ambulatory Visit: Payer: Self-pay | Admitting: Internal Medicine

## 2014-11-09 ENCOUNTER — Encounter: Payer: Self-pay | Admitting: Family Medicine

## 2014-11-09 ENCOUNTER — Ambulatory Visit (INDEPENDENT_AMBULATORY_CARE_PROVIDER_SITE_OTHER): Payer: Commercial Managed Care - HMO | Admitting: Family Medicine

## 2014-11-09 VITALS — BP 138/86 | HR 83 | Temp 98.5°F

## 2014-11-09 DIAGNOSIS — H8110 Benign paroxysmal vertigo, unspecified ear: Secondary | ICD-10-CM | POA: Insufficient documentation

## 2014-11-09 MED ORDER — DIAZEPAM 5 MG PO TABS: 5.0000 mg | ORAL_TABLET | Freq: Four times a day (QID) | ORAL | Status: DC | PRN

## 2014-11-09 MED ORDER — PROMETHAZINE HCL 25 MG PO TABS: 25.0000 mg | ORAL_TABLET | ORAL | Status: DC | PRN

## 2014-11-09 NOTE — Telephone Encounter (Signed)
Pt saw dr fry today.   Pt states the promethazine (PHENERGAN) 25 MG tablet rx she has expired 09/15/13 and she is unable to get it refilled.  Will need a new rx. [facility]

## 2014-11-09 NOTE — Telephone Encounter (Signed)
Pt notified to pick up at the pharmacy. 

## 2014-11-09 NOTE — Telephone Encounter (Signed)
Call in Promethazine 25 mg every 4 hours prn nausea, #60 with no rf

## 2014-11-09 NOTE — Progress Notes (Signed)
   Subjective:    Patient ID: Debra Atkinson, female    DOB: May 23, 1946, 69 y.o.   MRN: [id number]  HPI Here with her husband for another bout of vertigo. She sees Dr. [name] Reichmann for primary care and she has seen Dr. Tomi Likens for benign vertigo. She has seen Dr. Radene Journey in the past for sinus issues but not for vertigo per se. She had done well for several months but she developed some sinus congestion and pressure in the ears one week ago. She took some Clortrimeton with mixed results. Then this am she woke up with the room spinning and she has classic vertigo again. This makes her unsteady on her feet. She feels very nauseated. No HA or fever.    Review of Systems  Constitutional: Negative.   HENT: Positive for congestion and sinus pressure. Negative for ear discharge, ear pain and postnasal drip.   Eyes: Negative.   Respiratory: Negative.   Cardiovascular: Negative.   Neurological: Positive for dizziness. Negative for tremors, seizures, syncope, facial asymmetry, speech difficulty, weakness, light-headedness, numbness and headaches.       Objective:   Physical Exam  Constitutional: She is oriented to person, place, and time. She appears well-developed and well-nourished.  She seems okay at rest but is unable to walk without assistance  HENT:  Head: Normocephalic.  Right Ear: External ear normal.  Left Ear: External ear normal.  Nose: Nose normal.  Mouth/Throat: Oropharynx is clear and moist.  Eyes: Conjunctivae and EOM are normal. Pupils are equal, round, and reactive to light.  Neck: Neck supple. No thyromegaly present.  Cardiovascular: Normal rate, regular rhythm, normal heart sounds and intact distal pulses.   Pulmonary/Chest: Effort normal and breath sounds normal.  Lymphadenopathy:    She has no cervical adenopathy.  Neurological: She is alert and oriented to person, place, and time. No cranial nerve deficit. She exhibits normal muscle tone. Coordination normal.            Assessment & Plan:  This is another bout of BPV. She will start on Claritin D twice daily to relieve sinus pressure. Use Valium 5 mg prn for the dizziness. Use Promethazine for nausea. Follow up prn

## 2014-11-09 NOTE — Progress Notes (Signed)
Pre visit review using our clinic review tool, if applicable. No additional management support is needed unless otherwise documented below in the visit note. Pt unable to weigh 

## 2014-11-18 ENCOUNTER — Telehealth: Payer: Self-pay | Admitting: Internal Medicine

## 2014-11-18 NOTE — Telephone Encounter (Signed)
Patient received a letter from her insurance approving her for a procedure. "Hp muscle image stect mult:1 decimal 00 unit. 92178". She does not know who requested or what this is for. She is hoping you can help her figure this out. Please call patient.

## 2014-11-18 NOTE — Telephone Encounter (Signed)
Patient states that the below was sent for approval through [facility] from Dr. [name] Reichmann.  Can you call patient back in regards.

## 2014-11-18 NOTE — Telephone Encounter (Signed)
Pt states that she has received letter from [facility] approving her for a "heart muscle test"  She does not know who ordered  She called [employer] x 3 and was told each time it was ordered by her PCP- Dr [name] Reichmann, but his office told her to call Dr Melvyn Novas I advised her that we did not order this test for her  She verbalized understanding and nothing further needed

## 2014-11-18 NOTE — Telephone Encounter (Signed)
Pt advised to contact insurance to verify origination of referral

## 2014-11-18 NOTE — Telephone Encounter (Signed)
This is for the stress test that Dr. [name] Reichmann ordered for her. Pt is aware.

## 2014-11-22 ENCOUNTER — Telehealth: Payer: Self-pay | Admitting: Internal Medicine

## 2014-11-22 DIAGNOSIS — R42 Dizziness and giddiness: Secondary | ICD-10-CM

## 2014-11-22 NOTE — Telephone Encounter (Signed)
There is really no need for this, as she has seen Dr Jaffe/neurology, and ENT physicians area primarily surgeons

## 2014-11-22 NOTE — Telephone Encounter (Signed)
Would like a referral to ENT for dizzy spells.  Patient states Dr. [name] Reichmann has seen her for this condition.

## 2014-11-23 ENCOUNTER — Telehealth: Payer: Self-pay | Admitting: Internal Medicine

## 2014-11-23 NOTE — Telephone Encounter (Signed)
Left message on machine for pt to return my call  

## 2014-11-23 NOTE — Telephone Encounter (Signed)
Please call patient

## 2014-11-24 NOTE — Telephone Encounter (Signed)
Pt says that she was advised by Dr Tomi Likens to see an ENT, please advise

## 2014-11-24 NOTE — Telephone Encounter (Signed)
Referral done

## 2014-11-24 NOTE — Telephone Encounter (Signed)
Pt advised to expect a call from [facility] with appt info

## 2014-12-01 ENCOUNTER — Telehealth (HOSPITAL_COMMUNITY): Payer: Self-pay

## 2014-12-01 NOTE — Telephone Encounter (Signed)
Pt states she would like to go to see Dr. Lucia Gaskins for ENT referral.

## 2014-12-01 NOTE — Telephone Encounter (Signed)
Do not schedule on 6/2, 6/9 or 6/10.

## 2014-12-01 NOTE — Telephone Encounter (Signed)
Patient given detailed instructions per Myocardial Perfusion Study Information Sheet for test on 12-02-2014 at 7:45am. Patient verbalized understanding. Oletta Lamas, Lusine Corlett A

## 2014-12-02 ENCOUNTER — Ambulatory Visit (HOSPITAL_COMMUNITY): Payer: Commercial Managed Care - HMO | Attending: Internal Medicine

## 2014-12-02 DIAGNOSIS — R0789 Other chest pain: Secondary | ICD-10-CM | POA: Diagnosis not present

## 2014-12-02 LAB — MYOCARDIAL PERFUSION IMAGING
CHL CUP NUCLEAR SDS: 1
CHL CUP NUCLEAR SSS: 2
CHL CUP RESTING HR STRESS: 66 {beats}/min
CSEPPHR: 141 {beats}/min
LHR: 0.35
LV dias vol: 73 mL
LV sys vol: 16 mL
NUC STRESS EF: 78 %
SRS: 1
TID: 0.9

## 2014-12-02 MED ORDER — TECHNETIUM TC 99M SESTAMIBI GENERIC - CARDIOLITE
33.0000 | Freq: Once | INTRAVENOUS | Status: AC | PRN
  Administered 2014-12-02: 33 via INTRAVENOUS

## 2014-12-02 MED ORDER — TECHNETIUM TC 99M SESTAMIBI GENERIC - CARDIOLITE
11.0000 | Freq: Once | INTRAVENOUS | Status: AC | PRN
  Administered 2014-12-02: 11 via INTRAVENOUS

## 2014-12-07 NOTE — Telephone Encounter (Signed)
Pt is scheduled and she is aware of her appt.

## 2014-12-09 ENCOUNTER — Telehealth: Payer: Self-pay | Admitting: Internal Medicine

## 2014-12-09 NOTE — Telephone Encounter (Signed)
Pt called in and wanted to know the results of heart test she had 2 weeks ago.  She would like someone to call her with the results     Best number [phone]

## 2014-12-09 NOTE — Telephone Encounter (Signed)
Message was left on mychart  Stress test normal, thanks

## 2014-12-10 NOTE — Telephone Encounter (Signed)
Patient informed stress test normal

## 2014-12-10 NOTE — Telephone Encounter (Signed)
Pt advised results were normal via personal VM

## 2014-12-13 DIAGNOSIS — J310 Chronic rhinitis: Secondary | ICD-10-CM | POA: Diagnosis not present

## 2014-12-13 DIAGNOSIS — J342 Deviated nasal septum: Secondary | ICD-10-CM | POA: Diagnosis not present

## 2015-01-10 ENCOUNTER — Other Ambulatory Visit: Payer: Self-pay | Admitting: Internal Medicine

## 2015-01-10 ENCOUNTER — Ambulatory Visit (INDEPENDENT_AMBULATORY_CARE_PROVIDER_SITE_OTHER): Payer: Commercial Managed Care - HMO | Admitting: Internal Medicine

## 2015-01-10 ENCOUNTER — Encounter: Payer: Self-pay | Admitting: Internal Medicine

## 2015-01-10 VITALS — BP 130/78 | HR 73 | Temp 98.6°F | Resp 16 | Wt 200.0 lb

## 2015-01-10 DIAGNOSIS — M4013 Other secondary kyphosis, cervicothoracic region: Secondary | ICD-10-CM | POA: Diagnosis not present

## 2015-01-10 DIAGNOSIS — M542 Cervicalgia: Secondary | ICD-10-CM | POA: Diagnosis not present

## 2015-01-10 NOTE — Progress Notes (Signed)
Pre visit review using our clinic review tool, if applicable. No additional management support is needed unless otherwise documented below in the visit note. 

## 2015-01-10 NOTE — Patient Instructions (Addendum)
Use an anti-inflammatory cream such as Aspercreme or Zostrix cream twice a day to the affected area as needed. In lieu of this warm moist compresses or  hot water bottle can be used. Do not apply ice . Use a cervical memory foam pillow to prevent hyperextension or hyperflexion of the cervical spine.  Other options for the neck pain include [facility]  Because of the curvature of the spine; a bone density is recommended.That study will be scheduled and you'll be notified of the time.Please call the Referral Co-Ordinator @ [phone] if you have not been notified of appointment time within 7-10 days.
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
## 2015-02-01 ENCOUNTER — Telehealth: Payer: Self-pay | Admitting: Internal Medicine

## 2015-02-01 NOTE — Telephone Encounter (Signed)
Forwarding msg to pcp. Pls advise...Debra Atkinson

## 2015-02-01 NOTE — Telephone Encounter (Signed)
Patient states she is still having neck pain and would like to know if she needs to have imaging done to diagnose the problem. CB# [phone]

## 2015-02-01 NOTE — Telephone Encounter (Signed)
I would not know since I did not see pt last visit  OK to forward to Dr Linna Darner

## 2015-02-02 ENCOUNTER — Ambulatory Visit (INDEPENDENT_AMBULATORY_CARE_PROVIDER_SITE_OTHER)
Admission: RE | Admit: 2015-02-02 | Discharge: 2015-02-02 | Disposition: A | Discharge status: Home / Self Care | Payer: Commercial Managed Care - HMO | Source: Ambulatory Visit | Attending: Internal Medicine | Admitting: Internal Medicine

## 2015-02-02 ENCOUNTER — Other Ambulatory Visit: Payer: Self-pay | Admitting: Internal Medicine

## 2015-02-02 ENCOUNTER — Inpatient Hospital Stay: Admission: RE | Admit: 2015-02-02 | Payer: Commercial Managed Care - HMO | Source: Ambulatory Visit

## 2015-02-02 DIAGNOSIS — M542 Cervicalgia: Secondary | ICD-10-CM | POA: Diagnosis not present

## 2015-02-02 DIAGNOSIS — M4013 Other secondary kyphosis, cervicothoracic region: Secondary | ICD-10-CM | POA: Diagnosis not present

## 2015-02-02 DIAGNOSIS — M5032 Other cervical disc degeneration, mid-cervical region: Secondary | ICD-10-CM | POA: Diagnosis not present

## 2015-02-02 NOTE — Telephone Encounter (Signed)
Notified pt with md response.../lmb 

## 2015-02-02 NOTE — Telephone Encounter (Signed)
Order entered Ask her to make F/U appt with Dr [name] Reichmann after films made

## 2015-04-08 DIAGNOSIS — Z803 Family history of malignant neoplasm of breast: Secondary | ICD-10-CM | POA: Diagnosis not present

## 2015-04-08 DIAGNOSIS — Z1231 Encounter for screening mammogram for malignant neoplasm of breast: Secondary | ICD-10-CM | POA: Diagnosis not present

## 2015-04-08 LAB — HM MAMMOGRAPHY: HM Mammogram: NEGATIVE

## 2015-04-11 ENCOUNTER — Encounter: Payer: Self-pay | Admitting: Internal Medicine

## 2015-04-15 ENCOUNTER — Encounter: Payer: Self-pay | Admitting: Internal Medicine

## 2015-04-15 ENCOUNTER — Ambulatory Visit (INDEPENDENT_AMBULATORY_CARE_PROVIDER_SITE_OTHER): Payer: Commercial Managed Care - HMO | Admitting: Internal Medicine

## 2015-04-15 ENCOUNTER — Other Ambulatory Visit (INDEPENDENT_AMBULATORY_CARE_PROVIDER_SITE_OTHER): Payer: Commercial Managed Care - HMO

## 2015-04-15 ENCOUNTER — Other Ambulatory Visit: Payer: Commercial Managed Care - HMO

## 2015-04-15 VITALS — BP 128/84 | HR 73 | Temp 98.0°F | Ht 62.0 in | Wt 195.0 lb

## 2015-04-15 DIAGNOSIS — J309 Allergic rhinitis, unspecified: Secondary | ICD-10-CM

## 2015-04-15 DIAGNOSIS — R0789 Other chest pain: Secondary | ICD-10-CM | POA: Diagnosis not present

## 2015-04-15 DIAGNOSIS — Z0000 Encounter for general adult medical examination without abnormal findings: Secondary | ICD-10-CM

## 2015-04-15 DIAGNOSIS — E785 Hyperlipidemia, unspecified: Secondary | ICD-10-CM

## 2015-04-15 DIAGNOSIS — Z0189 Encounter for other specified special examinations: Secondary | ICD-10-CM

## 2015-04-15 DIAGNOSIS — M545 Low back pain, unspecified: Secondary | ICD-10-CM

## 2015-04-15 DIAGNOSIS — E119 Type 2 diabetes mellitus without complications: Secondary | ICD-10-CM

## 2015-04-15 DIAGNOSIS — Z23 Encounter for immunization: Secondary | ICD-10-CM

## 2015-04-15 LAB — BASIC METABOLIC PANEL
BUN: 16 mg/dL (ref 6–23)
CHLORIDE: 104 meq/L (ref 96–112)
CO2: 30 meq/L (ref 19–32)
Calcium: 9.9 mg/dL (ref 8.4–10.5)
Creatinine, Ser: 0.77 mg/dL (ref 0.40–1.20)
GFR: 78.97 mL/min (ref >60.00)
Glucose, Bld: 110 mg/dL — ABNORMAL HIGH (ref 70–99)
POTASSIUM: 5 meq/L (ref 3.5–5.1)
Sodium: 140 mEq/L (ref 135–145)

## 2015-04-15 LAB — URINALYSIS, ROUTINE W REFLEX MICROSCOPIC
Bilirubin Urine: NEGATIVE
Ketones, ur: NEGATIVE
LEUKOCYTES UA: NEGATIVE
Nitrite: NEGATIVE
Specific Gravity, Urine: 1.025 (ref 1.000–1.030)
TOTAL PROTEIN, URINE-UPE24: NEGATIVE
Urine Glucose: NEGATIVE
Urobilinogen, UA: 0.2 (ref 0.0–1.0)
WBC, UA: NONE SEEN (ref 0–?)
pH: 6 (ref 5.0–8.0)

## 2015-04-15 LAB — HEPATIC FUNCTION PANEL
ALT: 20 U/L (ref 0–35)
AST: 15 U/L (ref 0–37)
Albumin: 4.3 g/dL (ref 3.5–5.2)
Alkaline Phosphatase: 90 U/L (ref 39–117)
BILIRUBIN DIRECT: 0 mg/dL (ref 0.0–0.3)
BILIRUBIN TOTAL: 0.5 mg/dL (ref 0.2–1.2)
TOTAL PROTEIN: 7.6 g/dL (ref 6.0–8.3)

## 2015-04-15 LAB — LIPID PANEL
CHOL/HDL RATIO: 5
Cholesterol: 273 mg/dL — ABNORMAL HIGH (ref 0–200)
HDL: 57.6 mg/dL (ref >39.00)
LDL Cholesterol: 184 mg/dL — ABNORMAL HIGH (ref 0–99)
NONHDL: 215.51
TRIGLYCERIDES: 157 mg/dL — AB (ref 0.0–149.0)
VLDL: 31.4 mg/dL (ref 0.0–40.0)

## 2015-04-15 LAB — HEMOGLOBIN A1C: HEMOGLOBIN A1C: 6 % (ref 4.6–6.5)

## 2015-04-15 MED ORDER — FLUTICASONE PROPIONATE 50 MCG/ACT NA SUSP: 2.0000 | Freq: Every day | NASAL | Status: DC

## 2015-04-15 MED ORDER — METHYLPREDNISOLONE ACETATE 80 MG/ML IJ SUSP
80.0000 mg | Freq: Once | INTRAMUSCULAR | Status: AC
  Administered 2015-04-15: 80 mg via INTRAMUSCULAR

## 2015-04-15 NOTE — Patient Instructions (Addendum)
You had the flu shot today, and the steroid shot  Please take all new medication as prescribed - the flonase  Please continue all other medications as before, including motrin for pain  Please have the pharmacy call with any other refills you may need.  Please continue your efforts at being more active, low cholesterol diet, and weight control.  You are otherwise up to date with prevention measures today.  Please keep your appointments with your specialists as you may have planned  Please go to the LAB in the Basement (turn left off the elevator) for the tests to be done today  You will be contacted by phone if any changes need to be made immediately.  Otherwise, you will receive a letter about your results with an explanation, but please check with MyChart first.  Please remember to sign up for MyChart if you have not done so, as this will be important to you in the future with finding out test results, communicating by private email, and scheduling acute appointments online when needed.  Please return in 6 months, or sooner if needed, with Lab testing done 3-5 days before

## 2015-04-15 NOTE — Progress Notes (Signed)
Subjective:    Patient ID: Debra Atkinson, female    DOB: 1946-03-29, 69 y.o.   MRN: [id number]  HPI Here to f/u; overall doing ok,  Pt denies chest pain, increasing sob or doe, wheezing, orthopnea, PND, increased LE swelling, palpitations, dizziness or syncope.  Pt denies new neurological symptoms such as new headache, or facial or extremity weakness or numbness.  Pt denies polydipsia, polyuria, or low sugar episode.   Pt denies new neurological symptoms such as new headache, or facial or extremity weakness or numbness.   Pt states overall good compliance with meds, mostly trying to follow appropriate diet, with wt overall stable,  but little exercise however. Still has mild right intermittent neck pain.  Has some dialy persistent recurrent vertigo worse with lying down, mild except when assoc with nausea.  No falls. Has ongoing cough improved but still persists, assoc with nasal allergy symtpoms, better with chlortrimeton.  Also ? Nodule to lower mid sternal area.    Did not take the lipitor or any statin as she read it can cause cancer. Does have several wks ongoing nasal allergy symptoms with clearish congestion, itch and sneezing, without fever, pain, ST, cough, swelling or wheezing. Pt continues to have recurring LBP without change in severity, bowel or bladder change, fever, wt loss,  worsening LE pain/numbness/weakness, gait change or falls.  Past Medical History  Diagnosis Date  . Varicella   . Diabetes mellitus     diet management  . Genital warts     treated podophyllin  . Hyperlipidemia     diet managed  . Concussion '06, '11  . Fibromyalgia 10/08/2013   Past Surgical History  Procedure Laterality Date  . Cholecystectomy  1979    laparotomy  . Appendectomy  1979  . Cesarean section  1978    reports that she has never smoked. She has never used smokeless tobacco. She reports that she does not drink alcohol or use illicit drugs. family history includes COPD in her mother; Cancer  (age of onset: 69) in her mother; Diabetes in her maternal grandfather and mother; Heart disease in her father and mother; Rheumatic fever in her father. Allergies  Allergen Reactions  . Prednisone Itching    Redness from injection   Current Outpatient Prescriptions on File Prior to Visit  Medication Sig Dispense Refill  . chlorpheniramine (CHLOR-TRIMETON) 4 MG tablet Take 4 mg by mouth 2 (two) times daily as needed for allergies.    [name] ibuprofen (ADVIL,MOTRIN) 200 MG tablet Take 200 mg by mouth every 6 (six) hours as needed.    [name] ibuprofen (ADVIL,MOTRIN) 800 MG tablet TAKE ONE TABLET BY MOUTH EVERY 8 HOURS AS NEEDED FOR  MODERATE  PAIN 60 tablet 0  . Loratadine (CLARITIN PO) Take by mouth.     No current facility-administered medications on file prior to visit.    Review of Systems  Constitutional: Negative for unusual diaphoresis or night sweats HENT: Negative for ringing in ear or discharge Eyes: Negative for double vision or worsening visual disturbance.  Respiratory: Negative for choking and stridor.   Gastrointestinal: Negative for vomiting or other signifcant bowel change Genitourinary: Negative for hematuria or change in urine volume.  Musculoskeletal: Negative for other MSK pain or swelling Skin: Negative for color change and worsening wound.  Neurological: Negative for tremors and numbness other than noted  Psychiatric/Behavioral: Negative for decreased concentration or agitation other than above       Objective:   Physical Exam BP 128/84  mmHg  Pulse 73  Temp(Src) 98 F (36.7 C) (Oral)  Ht 5\' 2"  (1.575 m)  Wt 195 lb (88.451 kg)  BMI 35.66 kg/m2  SpO2 98% VS noted,  Constitutional: Pt appears in no significant distress HENT: Head: NCAT.  Right Ear: External ear normal.  Left Ear: External ear normal.  Eyes: . Pupils are equal, round, and reactive to light. Conjunctivae and EOM are normal Neck: Normal range of motion. Neck supple.  Bilat tm's with mild erythema.   Max sinus areas non tender.  Pharynx with mild erythema, no exudate Cardiovascular: Normal rate and regular rhythm.   Pulmonary/Chest: Effort normal and breath sounds without rales or wheezing.  Neurological: Pt is alert. Not confused , motor 5/5 intact, gait and dtr intact, spine nontender Skin: Skin is warm. No rash, no LE edema Psychiatric: Pt behavior is normal. No agitation.  Has tender xyphoid bone that corresponds to her tender lower sternal area    Assessment & Plan:

## 2015-04-15 NOTE — Progress Notes (Signed)
Pre visit review using our clinic review tool, if applicable. No additional management support is needed unless otherwise documented below in the visit note. 

## 2015-04-16 NOTE — Assessment & Plan Note (Signed)
stable overall by history and exam, recent data reviewed with pt, and pt to continue medical treatment as before,  to f/u any worsening symptoms or concerns, cont motrin prn

## 2015-04-16 NOTE — Assessment & Plan Note (Signed)
To cont diet as will not take statin, o./w stable overall by history and exam, recent data reviewed with pt, and pt to continue medical treatment as before,  to f/u any worsening symptoms or concerns Lab Results  Component Value Date   LDLCALC 184* 04/15/2015

## 2015-04-16 NOTE — Assessment & Plan Note (Signed)
C/w xyphoid bone tenderness, no specific tx or evaluation needed, pt reassured

## 2015-04-16 NOTE — Assessment & Plan Note (Signed)
stable overall by history and exam, recent data reviewed with pt, and pt to continue medical treatment as before,  to f/u any worsening symptoms or concerns Lab Results  Component Value Date   HGBA1C 6.0 04/15/2015

## 2015-04-16 NOTE — Assessment & Plan Note (Addendum)
Mild to mod likely seasonal flare, for depomedrol IM, add flonase asd,  to f/u any worsening symptoms or concerns

## 2015-05-05 ENCOUNTER — Encounter: Payer: Self-pay | Admitting: Internal Medicine

## 2015-05-05 ENCOUNTER — Ambulatory Visit (INDEPENDENT_AMBULATORY_CARE_PROVIDER_SITE_OTHER): Payer: Commercial Managed Care - HMO | Admitting: Internal Medicine

## 2015-05-05 VITALS — BP 148/90 | HR 85 | Temp 98.3°F | Ht 62.0 in | Wt 199.0 lb

## 2015-05-05 DIAGNOSIS — E119 Type 2 diabetes mellitus without complications: Secondary | ICD-10-CM | POA: Diagnosis not present

## 2015-05-05 DIAGNOSIS — R05 Cough: Secondary | ICD-10-CM | POA: Insufficient documentation

## 2015-05-05 DIAGNOSIS — E785 Hyperlipidemia, unspecified: Secondary | ICD-10-CM

## 2015-05-05 DIAGNOSIS — R059 Cough, unspecified: Secondary | ICD-10-CM

## 2015-05-05 MED ORDER — HYDROCODONE-HOMATROPINE 5-1.5 MG/5ML PO SYRP: 5.0000 mL | ORAL_SOLUTION | Freq: Four times a day (QID) | ORAL | Status: DC | PRN

## 2015-05-05 MED ORDER — AZITHROMYCIN 250 MG PO TABS: ORAL_TABLET | ORAL | Status: DC

## 2015-05-05 NOTE — Patient Instructions (Signed)
Please take all new medication as prescribed - the antibiotic, and cough medicine ° °Please continue all other medications as before, and refills have been done if requested. ° °Please have the pharmacy call with any other refills you may need. ° °Please continue your efforts at being more active, low cholesterol diet, and weight control. ° °Please keep your appointments with your specialists as you may have planned ° ° ° °

## 2015-05-05 NOTE — Assessment & Plan Note (Signed)
stable overall by history and exam, recent data reviewed with pt, and pt to continue medical treatment as before,  to f/u any worsening symptoms or concerns Lab Results  Component Value Date   LDLCALC 184* 04/15/2015   Cont's to declines statin

## 2015-05-05 NOTE — Assessment & Plan Note (Signed)
C/w bronchitis vs atypical infection, Mild to mod, for antibx course,  to f/u any worsening symptoms or concerns

## 2015-05-05 NOTE — Progress Notes (Signed)
Subjective:    Patient ID: Debra Atkinson, female    DOB: 07-11-1945, 69 y.o.   MRN: [id number]  HPI    Here with acute onset mild to mod 2-3 days ST, HA, general weakness and malaise, with prod cough scant sputum, but Pt denies chest pain, increased sob or doe, wheezing, orthopnea, PND, increased LE swelling, palpitations, dizziness or syncope. Pt denies new neurological symptoms such as new headache, or facial or extremity weakness or numbness   Pt denies polydipsia, polyuria.  Denies worsening depressive symptoms, suicidal ideation, or panic Past Medical History  Diagnosis Date  . Varicella   . Diabetes mellitus     diet management  . Genital warts     treated podophyllin  . Hyperlipidemia     diet managed  . Concussion '06, '11  . Fibromyalgia 10/08/2013   Past Surgical History  Procedure Laterality Date  . Cholecystectomy  1979    laparotomy  . Appendectomy  1979  . Cesarean section  1978    reports that she has never smoked. She has never used smokeless tobacco. She reports that she does not drink alcohol or use illicit drugs. family history includes COPD in her mother; Cancer (age of onset: 61) in her mother; Diabetes in her maternal grandfather and mother; Heart disease in her father and mother; Rheumatic fever in her father. Allergies  Allergen Reactions  . Prednisone Itching    Redness from injection   Current Outpatient Prescriptions on File Prior to Visit  Medication Sig Dispense Refill  . chlorpheniramine (CHLOR-TRIMETON) 4 MG tablet Take 4 mg by mouth 2 (two) times daily as needed for allergies.    . fluticasone (FLONASE) 50 MCG/ACT nasal spray Place 2 sprays into both nostrils daily. (Patient not taking: Reported on 05/05/2015) 16 g 5  . ibuprofen (ADVIL,MOTRIN) 200 MG tablet Take 200 mg by mouth every 6 (six) hours as needed.    . Loratadine (CLARITIN PO) Take by mouth.     No current facility-administered medications on file prior to visit.     Review of  Systems  Constitutional: Negative for unusual diaphoresis or night sweats HENT: Negative for ringing in ear or discharge Eyes: Negative for double vision or worsening visual disturbance.  Respiratory: Negative for choking and stridor.   Gastrointestinal: Negative for vomiting or other signifcant bowel change Genitourinary: Negative for hematuria or change in urine volume.  Musculoskeletal: Negative for other MSK pain or swelling Skin: Negative for color change and worsening wound.  Neurological: Negative for tremors and numbness other than noted  Psychiatric/Behavioral: Negative for decreased concentration or agitation other than above       Objective:   Physical Exam BP 148/90 mmHg  Pulse 85  Temp(Src) 98.3 F (36.8 C) (Oral)  Ht 5\' 2"  (1.575 m)  Wt 199 lb (90.266 kg)  BMI 36.39 kg/m2  SpO2 96% VS noted, mild ill appearing Constitutional: Pt appears in no significant distress HENT: Head: NCAT.  Right Ear: External ear normal.  Left Ear: External ear normal.  Eyes: . Pupils are equal, round, and reactive to light. Conjunctivae and EOM are normal Bilat tm's with mild erythema.  Max sinus areas mild tender.  Pharynx with mild erythema, no exudate Neck: Normal range of motion. Neck supple.  Cardiovascular: Normal rate and regular rhythm.   Pulmonary/Chest: Effort normal and breath sounds without rales or wheezing.  Neurological: Pt is alert. Not confused , motor grossly intact Skin: Skin is warm. No rash, no LE edema  Psychiatric: Pt behavior is normal. No agitation.     Assessment & Plan:

## 2015-05-05 NOTE — Progress Notes (Signed)
Pre visit review using our clinic review tool, if applicable. No additional management support is needed unless otherwise documented below in the visit note. 

## 2015-05-05 NOTE — Assessment & Plan Note (Signed)
stable overall by history and exam, recent data reviewed with pt, and pt to continue medical treatment as before,  to f/u any worsening symptoms or concerns Lab Results  Component Value Date   HGBA1C 6.0 04/15/2015

## 2015-07-26 ENCOUNTER — Emergency Department (HOSPITAL_COMMUNITY)
Admission: EM | Admit: 2015-07-26 | Discharge: 2015-07-26 | Disposition: A | Discharge status: Home / Self Care | Payer: Commercial Managed Care - HMO | Attending: Emergency Medicine | Admitting: Emergency Medicine

## 2015-07-26 ENCOUNTER — Encounter (HOSPITAL_COMMUNITY): Payer: Self-pay | Admitting: Emergency Medicine

## 2015-07-26 ENCOUNTER — Emergency Department (HOSPITAL_COMMUNITY): Payer: Commercial Managed Care - HMO

## 2015-07-26 DIAGNOSIS — R079 Chest pain, unspecified: Secondary | ICD-10-CM | POA: Insufficient documentation

## 2015-07-26 DIAGNOSIS — Z79899 Other long term (current) drug therapy: Secondary | ICD-10-CM | POA: Diagnosis not present

## 2015-07-26 DIAGNOSIS — N12 Tubulo-interstitial nephritis, not specified as acute or chronic: Secondary | ICD-10-CM | POA: Diagnosis not present

## 2015-07-26 DIAGNOSIS — R197 Diarrhea, unspecified: Secondary | ICD-10-CM | POA: Diagnosis present

## 2015-07-26 DIAGNOSIS — E119 Type 2 diabetes mellitus without complications: Secondary | ICD-10-CM | POA: Insufficient documentation

## 2015-07-26 DIAGNOSIS — K529 Noninfective gastroenteritis and colitis, unspecified: Secondary | ICD-10-CM | POA: Diagnosis not present

## 2015-07-26 DIAGNOSIS — R1013 Epigastric pain: Secondary | ICD-10-CM | POA: Diagnosis not present

## 2015-07-26 DIAGNOSIS — N390 Urinary tract infection, site not specified: Secondary | ICD-10-CM | POA: Diagnosis not present

## 2015-07-26 DIAGNOSIS — R05 Cough: Secondary | ICD-10-CM | POA: Diagnosis not present

## 2015-07-26 LAB — COMPREHENSIVE METABOLIC PANEL
ALBUMIN: 3.9 g/dL (ref 3.5–5.0)
ALT: 19 U/L (ref 14–54)
AST: 17 U/L (ref 15–41)
Alkaline Phosphatase: 93 U/L (ref 38–126)
Anion gap: 13 (ref 5–15)
BUN: 16 mg/dL (ref 6–20)
CHLORIDE: 102 mmol/L (ref 101–111)
CO2: 25 mmol/L (ref 22–32)
CREATININE: 0.86 mg/dL (ref 0.44–1.00)
Calcium: 9.6 mg/dL (ref 8.9–10.3)
GFR calc Af Amer: >60 mL/min (ref >60)
GFR calc non Af Amer: >60 mL/min (ref >60)
Glucose, Bld: 148 mg/dL — ABNORMAL HIGH (ref 65–99)
Potassium: 4.4 mmol/L (ref 3.5–5.1)
SODIUM: 140 mmol/L (ref 135–145)
Total Bilirubin: 0.7 mg/dL (ref 0.3–1.2)
Total Protein: 7.2 g/dL (ref 6.5–8.1)

## 2015-07-26 LAB — CBC
HCT: 44.2 % (ref 36.0–46.0)
Hemoglobin: 14.4 g/dL (ref 12.0–15.0)
MCH: 28 pg (ref 26.0–34.0)
MCHC: 32.6 g/dL (ref 30.0–36.0)
MCV: 85.8 fL (ref 78.0–100.0)
PLATELETS: 292 10*3/uL (ref 150–400)
RBC: 5.15 MIL/uL — ABNORMAL HIGH (ref 3.87–5.11)
RDW: 13.3 % (ref 11.5–15.5)
WBC: 12.8 10*3/uL — AB (ref 4.0–10.5)

## 2015-07-26 LAB — URINALYSIS, ROUTINE W REFLEX MICROSCOPIC
Bilirubin Urine: NEGATIVE
GLUCOSE, UA: NEGATIVE mg/dL
KETONES UR: NEGATIVE mg/dL
Nitrite: NEGATIVE
PROTEIN: NEGATIVE mg/dL
Specific Gravity, Urine: 1.024 (ref 1.005–1.030)
pH: 5.5 (ref 5.0–8.0)

## 2015-07-26 LAB — LIPASE, BLOOD: LIPASE: 26 U/L (ref 11–51)

## 2015-07-26 LAB — CBG MONITORING, ED: GLUCOSE-CAPILLARY: 141 mg/dL — AB (ref 65–99)

## 2015-07-26 LAB — URINE MICROSCOPIC-ADD ON

## 2015-07-26 LAB — I-STAT TROPONIN, ED: Troponin i, poc: 0 ng/mL (ref 0.00–0.08)

## 2015-07-26 MED ORDER — ONDANSETRON 4 MG PO TBDP: 4.0000 mg | ORAL_TABLET | Freq: Three times a day (TID) | ORAL | Status: DC | PRN

## 2015-07-26 MED ORDER — CEPHALEXIN 500 MG PO CAPS: 500.0000 mg | ORAL_CAPSULE | Freq: Three times a day (TID) | ORAL | Status: AC

## 2015-07-26 MED ORDER — CEPHALEXIN 500 MG PO CAPS: 500.0000 mg | ORAL_CAPSULE | Freq: Three times a day (TID) | ORAL | Status: DC

## 2015-07-26 MED ORDER — ONDANSETRON 4 MG PO TBDP
ORAL_TABLET | ORAL | Status: AC
  Filled 2015-07-26: qty 1

## 2015-07-26 MED ORDER — DEXTROSE 5 % IV SOLN
1.0000 g | Freq: Once | INTRAVENOUS | Status: AC
  Administered 2015-07-26: 1 g via INTRAVENOUS
  Filled 2015-07-26: qty 10

## 2015-07-26 MED ORDER — ONDANSETRON 4 MG PO TBDP
4.0000 mg | ORAL_TABLET | Freq: Once | ORAL | Status: AC | PRN
  Administered 2015-07-26: 4 mg via ORAL

## 2015-07-26 MED ORDER — SODIUM CHLORIDE 0.9 % IV BOLUS (SEPSIS)
1000.0000 mL | Freq: Once | INTRAVENOUS | Status: AC
  Administered 2015-07-26: 1000 mL via INTRAVENOUS

## 2015-07-26 NOTE — ED Notes (Signed)
Per lab, wrong specimen with wrong requisition was sent.   Advised staff that sent the sample to correct.

## 2015-07-26 NOTE — ED Notes (Signed)
Spoke with EDP do not need second troponin notified patient.

## 2015-07-26 NOTE — ED Notes (Signed)
EKG completed given to EDP.  

## 2015-07-26 NOTE — ED Notes (Signed)
Pt c/o muscle spasm at left side over last few years.

## 2015-07-26 NOTE — ED Notes (Signed)
C/o abd pain, vomiting, diarrhea since last night, A/O X4 and in NAD

## 2015-07-26 NOTE — ED Notes (Signed)
Pt to XRAy 

## 2015-07-26 NOTE — ED Provider Notes (Signed)
CSN: [id number]     Arrival date & time 07/26/15  0907 History   First MD Initiated Contact with Patient 07/26/15 1047     Chief Complaint  Patient presents with  . Abdominal Pain  . Emesis  . Diarrhea     (Consider location/radiation/quality/duration/timing/severity/associated sxs/prior Treatment) HPI Comments: 5-6 emesis 6 diarrhea Abd pain top of stomach, at times  Patient is a 70 y.o. female presenting with abdominal pain, vomiting, and diarrhea.  Abdominal Pain Pain location:  Epigastric (across abdomen and back pain, middle lower back) Pain quality: aching (bilateral back/flank)   Pain quality comment:  Sore pain in abd Pain radiates to:  Does not radiate Pain severity:  No pain Timing:  Constant Progression:  Waxing and waning Relieved by:  Nothing Worsened by:  Nothing tried Ineffective treatments:  None tried Associated symptoms: chest pain (last night had catch then was gone, has history of this happening before), chills (cold), diarrhea, dysuria (because diarrhea per pt), nausea and vomiting   Associated symptoms: no constipation, no cough, no fever (99.7 at home), no shortness of breath, no sore throat, no vaginal bleeding and no vaginal discharge   Emesis Associated symptoms: abdominal pain, chills (cold) and diarrhea   Associated symptoms: no headaches and no sore throat   Diarrhea Associated symptoms: abdominal pain, chills (cold) and vomiting   Associated symptoms: no fever (99.7 at home) and no headaches     Past Medical History  Diagnosis Date  . Varicella   . Diabetes mellitus     diet management  . Genital warts     treated podophyllin  . Hyperlipidemia     diet managed  . Concussion '06, '11  . Fibromyalgia 10/08/2013   Past Surgical History  Procedure Laterality Date  . Cholecystectomy  1979    laparotomy  . Appendectomy  1979  . Cesarean section  1978   Family History  Problem Relation Age of Onset  . Diabetes Mother   . Heart disease  Mother     CHF  . Cancer Mother 97    colon cancer  . COPD Mother   . Heart disease Father     CAD/MI  . Rheumatic fever Father   . Diabetes Maternal Grandfather    Social History  Substance Use Topics  . Smoking status: Never Smoker   . Smokeless tobacco: Never Used  . Alcohol Use: No   OB History    No data available     Review of Systems  Constitutional: Positive for chills (cold). Negative for fever (99.7 at home).  HENT: Negative for sore throat.   Eyes: Negative for visual disturbance.  Respiratory: Negative for cough and shortness of breath.   Cardiovascular: Positive for chest pain (last night had catch then was gone, has history of this happening before).  Gastrointestinal: Positive for nausea, vomiting, abdominal pain and diarrhea. Negative for constipation.  Genitourinary: Positive for dysuria (because diarrhea per pt) and frequency. Negative for vaginal bleeding, vaginal discharge and difficulty urinating.  Musculoskeletal: Positive for back pain. Negative for neck pain.  Skin: Negative for rash.  Neurological: Negative for syncope and headaches.      Allergies  Prednisone  Home Medications   Prior to Admission medications   Medication Sig Start Date End Date Taking? Authorizing Provider  chlorpheniramine (CHLOR-TRIMETON) 4 MG tablet Take 4 mg by mouth 2 (two) times daily as needed for allergies.   Yes Historical Provider, MD  ibuprofen (ADVIL,MOTRIN) 200 MG tablet Take 200 mg  by mouth every 6 (six) hours as needed.   Yes Historical Provider, MD  cephALEXin (KEFLEX) 500 MG capsule Take 1 capsule (500 mg total) by mouth 3 (three) times daily. 07/26/15 08/09/15  Gareth Morgan, MD  fluticasone (FLONASE) 50 MCG/ACT nasal spray Place 2 sprays into both nostrils daily. Patient not taking: Reported on 05/05/2015 04/15/15   Biagio Borg, MD  HYDROcodone-homatropine [facility]) 5-1.5 MG/5ML syrup Take 5 mLs by mouth every 6 (six) hours as needed for cough. 05/05/15    Biagio Borg, MD  ondansetron (ZOFRAN ODT) 4 MG disintegrating tablet Take 1 tablet (4 mg total) by mouth every 8 (eight) hours as needed for nausea or vomiting. 07/26/15   Gareth Morgan, MD   BP 141/65 mmHg  Pulse 91  Temp(Src) 98.8 F (37.1 C) (Oral)  Resp 18  Ht 4\' 4"  (1.321 m)  SpO2 97% Physical Exam  Constitutional: She is oriented to person, place, and time. She appears well-developed and well-nourished. No distress.  HENT:  Head: Normocephalic and atraumatic.  Eyes: Conjunctivae and EOM are normal.  Neck: Normal range of motion.  Cardiovascular: Normal rate, regular rhythm, normal heart sounds and intact distal pulses.  Exam reveals no gallop and no friction rub.   No murmur heard. Pulmonary/Chest: Effort normal and breath sounds normal. No respiratory distress. She has no wheezes. She has no rales.  Abdominal: Soft. She exhibits no distension. There is tenderness (mild diffuse). There is no guarding, no CVA tenderness, no tenderness at McBurney's point and negative Murphy's sign.  Musculoskeletal: She exhibits no edema.       Lumbar back: She exhibits tenderness.  Neurological: She is alert and oriented to person, place, and time. She has normal strength. No sensory deficit.  Full strength and sensation LE  Skin: Skin is warm and dry. No rash noted. She is not diaphoretic. No erythema.  Nursing note and vitals reviewed.   ED Course  Procedures (including critical care time) Labs Review Labs Reviewed  COMPREHENSIVE METABOLIC PANEL - Abnormal; Notable for the following:    Glucose, Bld 148 (*)    All other components within normal limits  CBC - Abnormal; Notable for the following:    WBC 12.8 (*)    RBC 5.15 (*)    All other components within normal limits  URINALYSIS, ROUTINE W REFLEX MICROSCOPIC (NOT AT [facility]) - Abnormal; Notable for the following:    APPearance CLOUDY (*)    Hgb urine dipstick MODERATE (*)    Leukocytes, UA LARGE (*)    All other components within  normal limits  URINE MICROSCOPIC-ADD ON - Abnormal; Notable for the following:    Squamous Epithelial / LPF 6-30 (*)    Bacteria, UA MANY (*)    All other components within normal limits  CBG MONITORING, ED - Abnormal; Notable for the following:    Glucose-Capillary 141 (*)    All other components within normal limits  LIPASE, BLOOD  I-STAT TROPOININ, ED    Imaging Review Dg Chest 2 View  07/26/2015  CLINICAL DATA:  Epigastric and abdominal pain and dizziness. Headache. Dry cough. EXAM: CHEST  2 VIEW COMPARISON:  06/08/2014. FINDINGS: Normal sized heart. Clear lungs. Normal appearing bones. Cholecystectomy clips. IMPRESSION: Normal examination. Electronically Signed   By: Claudie Revering M.D.   On: 07/26/2015 12:42   I have personally reviewed and evaluated these images and lab results as part of my medical decision-making.   EKG Interpretation   Date/Time:  Tuesday July 26 2015  12:31:51 EST Ventricular Rate:  94 PR Interval:  173 QRS Duration: 77 QT Interval:  345 QTC Calculation: 431 R Axis:   24 Text Interpretation:  Sinus rhythm Anteroseptal infarct, old No  significant change since last tracing Confirmed by [facility] MD, Windle Huebert  ([id number]) on 07/26/2015 1:00:56 PM      MDM   Final diagnoses:  UTI (lower urinary tract infection), possible pyelonephritis  Gastroenteritis   70yo female with history of DM, hyperlipidemia, fibromyalgia, cholecystectomy, appendectomy, presents with concern for nausea, vomiting, diarrhea, and abdominal pain.  Pt reports episode of CP "catching" similar to prior which occurred, has resolved. CXR, troponin, EKG WNL. Abdominal exam benign, history not consistent with nephrolithiasis.  History consistent with viral gastroenteritis. No risk factors for CDiff. Pt with mild dehydration, improved with IVF.  Lipase negative, labs WNL.  Urinalysis concerning for UTI, and given findings with nausea/vomiting/urinary frequency will also treat for pyelonephritis  with 2 weeks of keflex and zofran.  Patient improved after fluids and nausea medicine in ED.  Patient discharged in stable condition with understanding of reasons to return.    Gareth Morgan, MD 07/26/15 2302

## 2015-07-29 ENCOUNTER — Encounter: Payer: Self-pay | Admitting: Internal Medicine

## 2015-07-29 ENCOUNTER — Ambulatory Visit (INDEPENDENT_AMBULATORY_CARE_PROVIDER_SITE_OTHER): Payer: Commercial Managed Care - HMO | Admitting: Internal Medicine

## 2015-07-29 VITALS — BP 128/80 | HR 73 | Temp 98.9°F | Resp 20 | Wt 201.0 lb

## 2015-07-29 DIAGNOSIS — K529 Noninfective gastroenteritis and colitis, unspecified: Secondary | ICD-10-CM | POA: Diagnosis not present

## 2015-07-29 DIAGNOSIS — N3000 Acute cystitis without hematuria: Secondary | ICD-10-CM

## 2015-07-29 DIAGNOSIS — E119 Type 2 diabetes mellitus without complications: Secondary | ICD-10-CM | POA: Diagnosis not present

## 2015-07-29 DIAGNOSIS — N390 Urinary tract infection, site not specified: Secondary | ICD-10-CM | POA: Insufficient documentation

## 2015-07-29 DIAGNOSIS — M797 Fibromyalgia: Secondary | ICD-10-CM | POA: Diagnosis not present

## 2015-07-29 NOTE — Assessment & Plan Note (Signed)
Improved, likely viral,  to f/u any worsening symptoms or concerns

## 2015-07-29 NOTE — Patient Instructions (Signed)
Please take all new medication as prescribed - the tramadol  Please continue all other medications as before, and refills have been done if requested.  Please have the pharmacy call with any other refills you may need.  Please keep your appointments with your specialists as you may have planned

## 2015-07-29 NOTE — Progress Notes (Signed)
Pre visit review using our clinic review tool, if applicable. No additional management support is needed unless otherwise documented below in the visit note. 

## 2015-07-29 NOTE — Assessment & Plan Note (Signed)
For tramadol prn pain flare, hopefully short term need,  to f/u any worsening symptoms or concerns

## 2015-07-29 NOTE — Progress Notes (Signed)
Subjective:    Patient ID: Debra Atkinson, female    DOB: [birth year], 70 y.o.   MRN: [id number]  HPI  Here to f/u recent ER visit jan 24 with n/v/abd pain/diarrhea as well as UTI,  GI symptoms have resolved, and Denies urinary symptoms such as dysuria, frequency, urgency, flank pain, hematuria or n/v, fever, chills.  Does still feel poorly, tired, malaise and weakness, with myalgias and lower neck as well as lower back pain she chronically has that seems worse with the current illness.  Also with tinnitus and ear roaring. Still with chronic daily vertigo, positional without other change, with nausea, asks for zofran refill, meclizine no help.  Pt denies new neurological symptoms such as new headache, or facial or extremity weakness or numbness   Tolerating the antibx well   Pt denies polydipsia, polyuria  Past Medical History  Diagnosis Date  . Varicella   . Diabetes mellitus     diet management  . Genital warts     treated podophyllin  . Hyperlipidemia     diet managed  . Concussion '06, '11  . Fibromyalgia 10/08/2013   Past Surgical History  Procedure Laterality Date  . Cholecystectomy  1979    laparotomy  . Appendectomy  1979  . Cesarean section  1978    reports that she has never smoked. She has never used smokeless tobacco. She reports that she does not drink alcohol or use illicit drugs. family history includes COPD in her mother; Cancer (age of onset: 64) in her mother; Diabetes in her maternal grandfather and mother; Heart disease in her father and mother; Rheumatic fever in her father. Allergies  Allergen Reactions  . Prednisone Itching    Redness from injection   Current Outpatient Prescriptions on File Prior to Visit  Medication Sig Dispense Refill  . cephALEXin (KEFLEX) 500 MG capsule Take 1 capsule (500 mg total) by mouth 3 (three) times daily. 42 capsule 0  . ibuprofen (ADVIL,MOTRIN) 200 MG tablet Take 200 mg by mouth every 6 (six) hours as needed.    . ondansetron  (ZOFRAN ODT) 4 MG disintegrating tablet Take 1 tablet (4 mg total) by mouth every 8 (eight) hours as needed for nausea or vomiting. 20 tablet 0   No current facility-administered medications on file prior to visit.   Review of Systems  Constitutional: Negative for unusual diaphoresis or night sweats HENT: Negative for ringing in ear or discharge Eyes: Negative for double vision or worsening visual disturbance.  Respiratory: Negative for choking and stridor.   Gastrointestinal: Negative for vomiting or other signifcant bowel change Genitourinary: Negative for hematuria or change in urine volume.  Musculoskeletal: Negative for other MSK pain or swelling Skin: Negative for color change and worsening wound.  Neurological: Negative for tremors and numbness other than noted  Psychiatric/Behavioral: Negative for decreased concentration or agitation other than above       Objective:   Physical Exam BP 128/80 mmHg  Pulse 73  Temp(Src) 98.9 F (37.2 C) (Oral)  Resp 20  Wt 201 lb (91.173 kg)  SpO2 95% VS noted,  Constitutional: Pt appears in no significant distress HENT: Head: NCAT.  Right Ear: External ear normal.  Left Ear: External ear normal.  Eyes: . Pupils are equal, round, and reactive to light. Conjunctivae and EOM are normal Bilat tm's with mild erythema.  Max sinus areas non tender.  Pharynx with mild erythema, no exudate Neck: Normal range of motion. Neck supple.  Cardiovascular: Normal rate and  regular rhythm.   Pulmonary/Chest: Effort normal and breath sounds without rales or wheezing.  Abd:  Soft, NT, ND, + BS Neurological: Pt is alert. Not confused , motor grossly intact Skin: Skin is warm. No rash, no LE edema Psychiatric: Pt behavior is normal. No agitation.     Assessment & Plan:

## 2015-07-29 NOTE — Assessment & Plan Note (Signed)
Overall improved, to continue antibx,  to f/u any worsening symptoms or concerns

## 2015-07-29 NOTE — Assessment & Plan Note (Signed)
stable overall by history and exam, recent data reviewed with pt, and pt to continue medical treatment as before,  to f/u any worsening symptoms or concerns Lab Results  Component Value Date   HGBA1C 6.0 04/15/2015    

## 2015-08-30 DIAGNOSIS — H2513 Age-related nuclear cataract, bilateral: Secondary | ICD-10-CM | POA: Diagnosis not present

## 2015-08-30 DIAGNOSIS — E119 Type 2 diabetes mellitus without complications: Secondary | ICD-10-CM | POA: Diagnosis not present

## 2015-09-21 DIAGNOSIS — H01006 Unspecified blepharitis left eye, unspecified eyelid: Secondary | ICD-10-CM | POA: Diagnosis not present

## 2015-09-21 DIAGNOSIS — H01003 Unspecified blepharitis right eye, unspecified eyelid: Secondary | ICD-10-CM | POA: Diagnosis not present

## 2015-09-21 DIAGNOSIS — H168 Other keratitis: Secondary | ICD-10-CM | POA: Diagnosis not present

## 2015-09-29 DIAGNOSIS — H01006 Unspecified blepharitis left eye, unspecified eyelid: Secondary | ICD-10-CM | POA: Diagnosis not present

## 2015-09-29 DIAGNOSIS — H01003 Unspecified blepharitis right eye, unspecified eyelid: Secondary | ICD-10-CM | POA: Diagnosis not present

## 2015-09-29 DIAGNOSIS — H2513 Age-related nuclear cataract, bilateral: Secondary | ICD-10-CM | POA: Diagnosis not present

## 2015-10-12 ENCOUNTER — Telehealth: Payer: Self-pay | Admitting: Nurse Practitioner

## 2015-10-12 ENCOUNTER — Ambulatory Visit (INDEPENDENT_AMBULATORY_CARE_PROVIDER_SITE_OTHER): Payer: Commercial Managed Care - HMO | Admitting: Nurse Practitioner

## 2015-10-12 ENCOUNTER — Encounter: Payer: Self-pay | Admitting: Nurse Practitioner

## 2015-10-12 VITALS — BP 128/80 | HR 75 | Temp 97.5°F | Ht 61.0 in | Wt 205.2 lb

## 2015-10-12 DIAGNOSIS — J309 Allergic rhinitis, unspecified: Secondary | ICD-10-CM | POA: Diagnosis not present

## 2015-10-12 MED ORDER — TRIAMCINOLONE ACETONIDE 55 MCG/ACT NA AERO: 2.0000 | INHALATION_SPRAY | Freq: Every day | NASAL | Status: DC

## 2015-10-12 NOTE — Assessment & Plan Note (Signed)
Nasacort sent to pharmacy  FU prn worsening/failure to improve.

## 2015-10-12 NOTE — Patient Instructions (Signed)
Allergic Rhinitis Allergic rhinitis is when the mucous membranes in the nose respond to allergens. Allergens are particles in the air that cause your body to have an allergic reaction. This causes you to release allergic antibodies. Through a chain of events, these eventually cause you to release histamine into the blood stream. Although meant to protect the body, it is this release of histamine that causes your discomfort, such as frequent sneezing, congestion, and an itchy, runny nose.  CAUSES Seasonal allergic rhinitis (hay fever) is caused by pollen allergens that may come from grasses, trees, and weeds. Year-round allergic rhinitis (perennial allergic rhinitis) is caused by allergens such as house dust mites, pet dander, and mold spores. SYMPTOMS  Nasal stuffiness (congestion).  Itchy, runny nose with sneezing and tearing of the eyes. DIAGNOSIS Your health care provider can help you determine the allergen or allergens that trigger your symptoms. If you and your health care provider are unable to determine the allergen, skin or blood testing may be used. Your health care provider will diagnose your condition after taking your health history and performing a physical exam. Your health care provider may assess you for other related conditions, such as asthma, pink eye, or an ear infection. TREATMENT Allergic rhinitis does not have a cure, but it can be controlled by:  Medicines that block allergy symptoms. These may include allergy shots, nasal sprays, and oral antihistamines.  Avoiding the allergen. Hay fever may often be treated with antihistamines in pill or nasal spray forms. Antihistamines block the effects of histamine. There are over-the-counter medicines that may help with nasal congestion and swelling around the eyes. Check with your health care provider before taking or giving this medicine. If avoiding the allergen or the medicine prescribed do not work, there are many new medicines  your health care provider can prescribe. Stronger medicine may be used if initial measures are ineffective. Desensitizing injections can be used if medicine and avoidance does not work. Desensitization is when a patient is given ongoing shots until the body becomes less sensitive to the allergen. Make sure you follow up with your health care provider if problems continue. HOME CARE INSTRUCTIONS It is not possible to completely avoid allergens, but you can reduce your symptoms by taking steps to limit your exposure to them. It helps to know exactly what you are allergic to so that you can avoid your specific triggers. SEEK MEDICAL CARE IF:  You have a fever.  You develop a cough that does not stop easily (persistent).  You have shortness of breath.  You start wheezing.  Symptoms interfere with normal daily activities.   This information is not intended to replace advice given to you by your health care provider. Make sure you discuss any questions you have with your health care provider.   Document Released: 03/13/2001 Document Revised: 07/09/2014 Document Reviewed: 02/23/2013 Elsevier Interactive Patient Education 2016 [employer]  

## 2015-10-12 NOTE — Progress Notes (Signed)
Pre visit review using our clinic review tool, if applicable. No additional management support is needed unless otherwise documented below in the visit note. 

## 2015-10-12 NOTE — Progress Notes (Signed)
Patient ID: Debra Atkinson, female    DOB: 16-Aug-1945  Age: 71 y.o. MRN: [id number]  CC: Cough   HPI SHUNNA BUSAM presents for CC of allergy symptoms x 4-5 days.   1) Mowed lawn this weekend and was around sick grandchildren.  No past h/o seasonal allergies. ST, PNDrip, HA frontal, dry cough  Rhinorrhea Clear  Ear pressure   Treatment to date: Ibuprofen only   History [name] has a past medical history of Varicella; Diabetes mellitus; Genital warts; Hyperlipidemia; Concussion ('06, '11); and Fibromyalgia (10/08/2013).   She has past surgical history that includes Cholecystectomy (1979); Appendectomy (1979); and Cesarean section (1978).   Her family history includes COPD in her mother; Cancer (age of onset: 31) in her mother; Diabetes in her maternal grandfather and mother; Heart disease in her father and mother; Rheumatic fever in her father.She reports that she has never smoked. She has never used smokeless tobacco. She reports that she does not drink alcohol or use illicit drugs.  Outpatient Prescriptions Prior to Visit  Medication Sig Dispense Refill  . ibuprofen (ADVIL,MOTRIN) 200 MG tablet Take 200 mg by mouth every 6 (six) hours as needed.    . ondansetron (ZOFRAN ODT) 4 MG disintegrating tablet Take 1 tablet (4 mg total) by mouth every 8 (eight) hours as needed for nausea or vomiting. 20 tablet 0   No facility-administered medications prior to visit.    ROS Review of Systems  Constitutional: Positive for fatigue. Negative for fever, chills and diaphoresis.  HENT: Positive for congestion, ear pain, postnasal drip, rhinorrhea, sinus pressure, sneezing and sore throat. Negative for trouble swallowing and voice change.   Eyes: Positive for discharge, redness and itching. Negative for photophobia, pain and visual disturbance.  Respiratory: Positive for cough. Negative for chest tightness, shortness of breath and wheezing.   Cardiovascular: Negative for chest pain, palpitations  and leg swelling.  Gastrointestinal: Negative for nausea, vomiting and diarrhea.  Skin: Negative for rash.  Neurological: Negative for dizziness and headaches.    Objective:  BP 128/80 mmHg  Pulse 75  Temp(Src) 97.5 F (36.4 C) (Oral)  Ht 4\' 4"  (1.321 m)  Wt 205 lb 3 oz (93.072 kg)  BMI 53.34 kg/m2  SpO2 98%  Physical Exam  Constitutional: She is oriented to person, place, and time. She appears well-developed and well-nourished. No distress.  HENT:  Head: Normocephalic and atraumatic.  Right Ear: External ear normal.  Left Ear: External ear normal.  Mouth/Throat: Oropharynx is clear and moist. No oropharyngeal exudate.  TMs injected bilaterally L some cerumen in canal, slightly bulging, landmarks visible Boggy Turbinates  Eyes: EOM are normal. Pupils are equal, round, and reactive to light. Right eye exhibits no discharge. Left eye exhibits no discharge. No scleral icterus.  Neck: Normal range of motion. Neck supple.  Cardiovascular: Normal rate and regular rhythm.   Pulmonary/Chest: Effort normal and breath sounds normal. No respiratory distress. She has no wheezes. She has no rales. She exhibits no tenderness.  Lymphadenopathy:    She has no cervical adenopathy.  Neurological: She is alert and oriented to person, place, and time.  Skin: Skin is warm and dry. No rash noted. She is not diaphoretic.  Psychiatric: She has a normal mood and affect. Her behavior is normal. Judgment and thought content normal.   Assessment & Plan:   There are no diagnoses linked to this encounter. I am having Ms. Voth maintain her ibuprofen, ondansetron, and trimethoprim-polymyxin b.  Meds ordered this encounter  Medications  .  trimethoprim-polymyxin b (POLYTRIM) ophthalmic solution    Sig:      Follow-up: Return if symptoms worsen or fail to improve.

## 2015-10-12 NOTE — Telephone Encounter (Signed)
Pt called state that she look at the AVS paper and her hight is wrong, it should have been 5 feet 1 inch. Also she was wondering if we can removed trimethoprim-polymyxin b (POLYTRIM) ophthalmic solution from her current med because she is not taking this anymore.

## 2015-10-14 ENCOUNTER — Ambulatory Visit: Payer: Commercial Managed Care - HMO | Admitting: Internal Medicine

## 2015-10-18 ENCOUNTER — Other Ambulatory Visit: Payer: Self-pay

## 2015-10-18 NOTE — Telephone Encounter (Signed)
This has been completed.

## 2015-10-19 ENCOUNTER — Ambulatory Visit: Payer: Commercial Managed Care - HMO | Admitting: Internal Medicine

## 2015-10-26 ENCOUNTER — Other Ambulatory Visit (INDEPENDENT_AMBULATORY_CARE_PROVIDER_SITE_OTHER): Payer: Commercial Managed Care - HMO

## 2015-10-26 ENCOUNTER — Ambulatory Visit (INDEPENDENT_AMBULATORY_CARE_PROVIDER_SITE_OTHER): Payer: Commercial Managed Care - HMO | Admitting: Internal Medicine

## 2015-10-26 VITALS — BP 120/84 | HR 77 | Temp 97.9°F | Resp 20 | Wt 209.0 lb

## 2015-10-26 DIAGNOSIS — Z1159 Encounter for screening for other viral diseases: Secondary | ICD-10-CM

## 2015-10-26 DIAGNOSIS — Z0001 Encounter for general adult medical examination with abnormal findings: Secondary | ICD-10-CM

## 2015-10-26 DIAGNOSIS — E785 Hyperlipidemia, unspecified: Secondary | ICD-10-CM | POA: Diagnosis not present

## 2015-10-26 DIAGNOSIS — E119 Type 2 diabetes mellitus without complications: Secondary | ICD-10-CM | POA: Diagnosis not present

## 2015-10-26 DIAGNOSIS — J069 Acute upper respiratory infection, unspecified: Secondary | ICD-10-CM | POA: Diagnosis not present

## 2015-10-26 DIAGNOSIS — J309 Allergic rhinitis, unspecified: Secondary | ICD-10-CM | POA: Diagnosis not present

## 2015-10-26 DIAGNOSIS — N3000 Acute cystitis without hematuria: Secondary | ICD-10-CM

## 2015-10-26 DIAGNOSIS — R6889 Other general symptoms and signs: Secondary | ICD-10-CM

## 2015-10-26 LAB — MICROALBUMIN / CREATININE URINE RATIO
Creatinine,U: 81.2 mg/dL
Microalb Creat Ratio: 1.1 mg/g (ref 0.0–30.0)
Microalb, Ur: 0.9 mg/dL (ref 0.0–1.9)

## 2015-10-26 LAB — BASIC METABOLIC PANEL
BUN: 18 mg/dL (ref 6–23)
CALCIUM: 9.7 mg/dL (ref 8.4–10.5)
CHLORIDE: 105 meq/L (ref 96–112)
CO2: 30 meq/L (ref 19–32)
CREATININE: 0.73 mg/dL (ref 0.40–1.20)
GFR: 83.85 mL/min (ref >60.00)
GLUCOSE: 106 mg/dL — AB (ref 70–99)
Potassium: 4.6 mEq/L (ref 3.5–5.1)
Sodium: 141 mEq/L (ref 135–145)

## 2015-10-26 LAB — CBC WITH DIFFERENTIAL/PLATELET
BASOS ABS: 0 10*3/uL (ref 0.0–0.1)
Basophils Relative: 0.6 % (ref 0.0–3.0)
EOS ABS: 0.1 10*3/uL (ref 0.0–0.7)
Eosinophils Relative: 1 % (ref 0.0–5.0)
HEMATOCRIT: 39 % (ref 36.0–46.0)
Hemoglobin: 13 g/dL (ref 12.0–15.0)
LYMPHS PCT: 37.9 % (ref 12.0–46.0)
Lymphs Abs: 2.7 10*3/uL (ref 0.7–4.0)
MCHC: 33.3 g/dL (ref 30.0–36.0)
MCV: 83 fl (ref 78.0–100.0)
MONOS PCT: 7.7 % (ref 3.0–12.0)
Monocytes Absolute: 0.6 10*3/uL (ref 0.1–1.0)
NEUTROS ABS: 3.8 10*3/uL (ref 1.4–7.7)
NEUTROS PCT: 52.8 % (ref 43.0–77.0)
PLATELETS: 337 10*3/uL (ref 150.0–400.0)
RBC: 4.69 Mil/uL (ref 3.87–5.11)
RDW: 14.1 % (ref 11.5–15.5)
WBC: 7.2 10*3/uL (ref 4.0–10.5)

## 2015-10-26 LAB — LIPID PANEL
Cholesterol: 251 mg/dL — ABNORMAL HIGH (ref 0–200)
HDL: 59.4 mg/dL (ref >39.00)
LDL Cholesterol: 168 mg/dL — ABNORMAL HIGH (ref 0–99)
NONHDL: 191.3
TRIGLYCERIDES: 119 mg/dL (ref 0.0–149.0)
Total CHOL/HDL Ratio: 4
VLDL: 23.8 mg/dL (ref 0.0–40.0)

## 2015-10-26 LAB — HEPATIC FUNCTION PANEL
ALBUMIN: 4.1 g/dL (ref 3.5–5.2)
ALT: 17 U/L (ref 0–35)
AST: 13 U/L (ref 0–37)
Alkaline Phosphatase: 83 U/L (ref 39–117)
BILIRUBIN DIRECT: 0 mg/dL (ref 0.0–0.3)
TOTAL PROTEIN: 7.2 g/dL (ref 6.0–8.3)
Total Bilirubin: 0.5 mg/dL (ref 0.2–1.2)

## 2015-10-26 LAB — HEMOGLOBIN A1C: HEMOGLOBIN A1C: 6.3 % (ref 4.6–6.5)

## 2015-10-26 LAB — TSH: TSH: 1.81 u[IU]/mL (ref 0.35–4.50)

## 2015-10-26 MED ORDER — TRIAMCINOLONE ACETONIDE 55 MCG/ACT NA AERO: 2.0000 | INHALATION_SPRAY | Freq: Every day | NASAL | Status: DC

## 2015-10-26 MED ORDER — AZITHROMYCIN 250 MG PO TABS: ORAL_TABLET | ORAL | Status: DC

## 2015-10-26 NOTE — Patient Instructions (Signed)
Please take all new medication as prescribed - the antibiotic  Please continue all other medications as before, including the nasacort  Please have the pharmacy call with any other refills you may need.  Please continue your efforts at being more active, low cholesterol diet, and weight control.  You are otherwise up to date with prevention measures today.  Please keep your appointments with your specialists as you may have planned  Please go to the LAB in the Basement (turn left off the elevator) for the tests to be done today  /You will be contacted by phone if any changes need to be made immediately.  Otherwise, you will receive a letter about your results with an explanation, but please check with MyChart first.  Please remember to sign up for MyChart if you have not done so, as this will be important to you in the future with finding out test results, communicating by private email, and scheduling acute appointments online when needed.  Please return in 6 months, or sooner if needed, with Lab testing done 3-5 days before

## 2015-10-26 NOTE — Progress Notes (Signed)
Subjective:    Patient ID: Debra Atkinson, female    DOB: December 02, 1945, 70 y.o.   MRN: [id number]  HPI  Here for wellness and f/u;  Overall doing ok;  Pt denies Chest pain, worsening SOB, DOE, wheezing, orthopnea, PND, worsening LE edema, palpitations, dizziness or syncope.  Pt denies neurological change such as new headache, facial or extremity weakness.  Pt denies polydipsia, polyuria, or low sugar symptoms. Pt states overall good compliance with treatment and medications, good tolerability, and has been trying to follow appropriate diet.  Pt denies worsening depressive symptoms, suicidal ideation or panic. No fever, night sweats, wt loss, loss of appetite, or other constitutional symptoms.  Pt states good ability with ADL's, has low fall risk, home safety reviewed and adequate, no other significant changes in hearing or vision, and only occasionally active with exercise. Still with throat and ear discomfort, has some white stuff in the back of the throat despite the gargling salt water last few days, also gets blisters or bumps to roof of mouth. No fever she is aware. Only used the nasaocrt a few times, then noticed the white areas.so stopped.  + fever last few days with ST as well.  Denies urinary symptoms such as dysuria, urgency, flank pain, hematuria or n/v, fever, chills, but maybe having some freq again in the past 3 days, asks for UA repeat Past Medical History  Diagnosis Date  . Varicella   . Diabetes mellitus     diet management  . Genital warts     treated podophyllin  . Hyperlipidemia     diet managed  . Concussion '06, '11  . Fibromyalgia 10/08/2013   Past Surgical History  Procedure Laterality Date  . Cholecystectomy  1979    laparotomy  . Appendectomy  1979  . Cesarean section  1978    reports that she has never smoked. She has never used smokeless tobacco. She reports that she does not drink alcohol or use illicit drugs. family history includes COPD in her mother; Cancer  (age of onset: 13) in her mother; Diabetes in her maternal grandfather and mother; Heart disease in her father and mother; Rheumatic fever in her father. Allergies  Allergen Reactions  . Prednisone Itching    Redness from injection   Current Outpatient Prescriptions on File Prior to Visit  Medication Sig Dispense Refill  . ibuprofen (ADVIL,MOTRIN) 200 MG tablet Take 200 mg by mouth every 6 (six) hours as needed.    . ondansetron (ZOFRAN ODT) 4 MG disintegrating tablet Take 1 tablet (4 mg total) by mouth every 8 (eight) hours as needed for nausea or vomiting. 20 tablet 0  . triamcinolone (NASACORT AQ) 55 MCG/ACT AERO nasal inhaler Place 2 sprays into the nose daily. 1 Inhaler 11   No current facility-administered medications on file prior to visit.   Review of Systems Constitutional: Negative for increased diaphoresis, or other activity, appetite or siginficant weight change other than noted HENT: Negative for worsening hearing loss, ear pain, facial swelling, mouth sores and neck stiffness.   Eyes: Negative for other worsening pain, redness or visual disturbance.  Respiratory: Negative for choking or stridor Cardiovascular: Negative for other chest pain and palpitations.  Gastrointestinal: Negative for worsening diarrhea, blood in stool, or abdominal distention Genitourinary: Negative for hematuria, flank pain or change in urine volume.  Musculoskeletal: Negative for myalgias or other joint complaints.  Skin: Negative for other color change and wound or drainage.  Neurological: Negative for syncope and numbness. other  than noted Hematological: Negative for adenopathy. or other swelling Psychiatric/Behavioral: Negative for hallucinations, SI, self-injury, decreased concentration or other worsening agitation.      Objective:   Physical Exam VS noted,  Constitutional: Pt is oriented to person, place, and time. Appears well-developed and well-nourished, in no significant distress Head:  Normocephalic and atraumatic  Eyes: Conjunctivae and EOM are normal. Pupils are equal, round, and reactive to light Right Ear: External ear normal.  Left Ear: External ear normal Nose: Nose normal.  Mouth/Throat: Oropharynx is clear and moist  Bilat tm's with mild erythema.  Max sinus areas mild tender.  Pharynx with mild erythema, no exudate Neck: Normal range of motion. Neck supple. No JVD present. No tracheal deviation present or significant neck LA or mass Cardiovascular: Normal rate, regular rhythm, normal heart sounds and intact distal pulses.   Pulmonary/Chest: Effort normal and breath sounds without rales or wheezing  Abdominal: Soft. Bowel sounds are normal. NT. No HSM  Musculoskeletal: Normal range of motion. Exhibits no edema Lymphadenopathy: Has no cervical adenopathy.  Neurological: Pt is alert and oriented to person, place, and time. Pt has normal reflexes. No cranial nerve deficit. Motor grossly intact Skin: Skin is warm and dry. No rash noted or new ulcers Psychiatric:  Has normal mood and affect. Behavior is normal.      Assessment & Plan:

## 2015-10-26 NOTE — Progress Notes (Signed)
Pre visit review using our clinic review tool, if applicable. No additional management support is needed unless otherwise documented below in the visit note. 

## 2015-10-27 LAB — HEPATITIS C ANTIBODY: HCV Ab: NEGATIVE

## 2015-10-30 NOTE — Assessment & Plan Note (Signed)
Asympt, recent UTI tx, for f/u UA today,  to f/u any worsening symptoms or concerns

## 2015-10-30 NOTE — Assessment & Plan Note (Signed)
stable overall by history and exam, recent data reviewed with pt, and pt to continue medical treatment as before,  to f/u any worsening symptoms or concerns Lab Results  Component Value Date   HGBA1C 6.3 10/26/2015

## 2015-10-30 NOTE — Assessment & Plan Note (Signed)
?   Control, stable overall by history and exam, recent data reviewed with pt, and pt to continue medical treatment as before,  to f/u any worsening symptoms or concerns, consider statin

## 2015-10-30 NOTE — Assessment & Plan Note (Signed)

## 2015-10-30 NOTE — Assessment & Plan Note (Addendum)
Mild to mod, for antibx course,  to f/u any worsening symptoms or concerns  In addition to the time spent performing CPE, I spent an additional 40 minutes face to face,in which greater than 50% of this time was spent in counseling and coordination of care for patient's acute illness as documented.

## 2015-10-30 NOTE — Assessment & Plan Note (Signed)
Mild, for nasacort otc prn,  to f/u any worsening symptoms or concerns

## 2015-10-31 ENCOUNTER — Telehealth: Payer: Self-pay

## 2015-10-31 NOTE — Telephone Encounter (Signed)
Patient would like to call her and go over labs with her. I read her the notes but she had some questions. Please follow up.

## 2015-11-01 NOTE — Telephone Encounter (Signed)
Patient aware.

## 2015-11-01 NOTE — Telephone Encounter (Signed)
Pt request to speak to the assistant again, she has a question more question.

## 2015-11-01 NOTE — Telephone Encounter (Signed)
Spoke to patient she is aware of results

## 2016-01-30 NOTE — Progress Notes (Addendum)
Subjective:   Debra Atkinson is a 70 y.o. female who presents for Medicare Annual (Subsequent) preventive examination.   HRA assessment completed during this visit with Debra Atkinson  The Patient was informed that the wellness visit is to identify future health risk and educate and initiate measures that can reduce risk for increased disease through the lifespan.    NO ROS; Medicare Wellness Visit Last OV:  07-Oct-2015 Hyperlipidemia/ educated on triglycerides and chol  DM - watches by what she eats  Labs completed: 07-Oct-2015 HDL 59 and LDL 168; cho 251; trig 119; 6.3    Psychosocial: ( mother DM; HD Cancer of colon and COPD- Father HD )  Educated regarding risk due to family hx Quit work to stay home and take care of mother; passed away in 2010-10-07   Medications reviewed for issues; compliance; otc meds  BMI: 36   Diet;  Slow down on sugar; Skips breakfast at times  Educated to eat balanced meal x 3; lean and green with vegetables;  See Assessment    Exercise;  lots of land work around the home;  Also keep grand children; 3 and 26 yo  Transport planner for [employer]; 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 Active her entire life Aging health involves staying engaged  Does hard work, as Data processing manager; flowers and garden  Never sedentary Spouse has 2 businesses  May go back to the y to walk as she was going but then stopped   Has friend that is riding a bike; may consider for pleasure  Ride it around the home; sometimes knees bother her but may try  Fearful of balance and falls' recommended she not do if uncomfortable   HOME SAFETY: one level   Personal safety issues reviewed for risk such as safe community; smoke detector; firearms safety if applicable; protection when in the sun; does wear a hat;  driving safety for seniors or any recent accidents. Fall hx; bad fall x 70 yo; on left knee; at son's; was reaching over  Fear of falling? Somewhat on bike; will take it slow /    Generally hurried and rushing; makes for accidents  UA or BOWEL incontinence; no  Given education on "Fall Prevention in the Home" for more safety tips the patient can apply as appropriate.   Risk for Depression reviewed: Any emotional problems? Anxious, depressed, irritable, sad or blue? No  Denies feeling depressed or hopeless; voices pleasure in daily life How many social activities have you been engaged in within the last 2 weeks? No    Sleep _ no memory issues  Cognitive;  Manages checkbook, medications; no failures of task Ad8 score reviewed for issues;  Issues making decisions; no  Less interest in hobbies / activities" no  Repeats questions, stories; family complaining: NO  Trouble using ordinary gadgets; microwave; computer: no  Forgets the month or year: no  Mismanaging finances: no  Missing apt: no but does write them down  Daily problems with thinking of memory NO Ad8 score is 0   Advanced Directive addressed; not  Completed but given Debra Atkinson form and educated   Counseling Health Maintenance Gaps:  Colonoscopy; 08/2012 /08/2022 EKG: 07/2015 Mammogram: 04/2015/ annually / [facility]  Dexa/ 01/2015  -1.2 / will not take any supplements  Educated on calcium and sunshine   PAP; still has uterus; not doing anymore  Will call gyn and see if she needs to come in for repeat exam or pap  Hearing: adequate  Ophthalmology exam: just had this year; normal  Has small cataracts; [facility]; Dr. Lossie Faes  Immunizations Due: (Vaccines reviewed and educated regarding any overdue)  Zostavax; hesitant to take after education; will discuss with Dr. Welton Flakes and updated Risk reviewed and appropriate referral made or health recommendations:  Current Care Team reviewed and updated   Cardiac Risk Factors include: advanced age (>20men, >24 women);dyslipidemia;hypertension     Objective:     Vitals: BP 140/80   Pulse 81   Resp (!) 98   Ht 5\' 3"   (1.6 m)   Wt 203 lb (92.1 kg)   BMI 35.96 kg/m   Body mass index is 35.96 kg/m.  Will check BP periodically   Tobacco History  Smoking Status  . Never Smoker  Smokeless Tobacco  . Never Used     Counseling given: Yes   Past Medical History:  Diagnosis Date  . Concussion '06, '11  . Diabetes mellitus    diet management  . Fibromyalgia 10/08/2013  . Genital warts    treated podophyllin  . Hyperlipidemia    diet managed  . Varicella    Past Surgical History:  Procedure Laterality Date  . APPENDECTOMY  1979  . [place]  . CHOLECYSTECTOMY  1979   laparotomy   Family History  Problem Relation Age of Onset  . Diabetes Mother   . Heart disease Mother     CHF  . Cancer Mother 47    colon cancer  . COPD Mother   . Heart disease Father     CAD/MI  . Rheumatic fever Father   . Diabetes Maternal Grandfather    History  Sexual Activity  . Sexual activity: Not Currently  . Partners: Male    Outpatient Encounter Prescriptions as of 01/31/2016  Medication Sig  . ibuprofen (ADVIL,MOTRIN) 200 MG tablet Take 200 mg by mouth every 6 (six) hours as needed.  . ondansetron (ZOFRAN ODT) 4 MG disintegrating tablet Take 1 tablet (4 mg total) by mouth every 8 (eight) hours as needed for nausea or vomiting. (Patient not taking: Reported on 01/31/2016)  . triamcinolone (NASACORT AQ) 55 MCG/ACT AERO nasal inhaler Place 2 sprays into the nose daily. (Patient not taking: Reported on 01/31/2016)  . [DISCONTINUED] azithromycin (ZITHROMAX Z-PAK) 250 MG tablet Use as directed   No facility-administered encounter medications on file as of 01/31/2016.     Activities of Daily Living In your present state of health, do you have any difficulty performing the following activities: 01/31/2016  Hearing? N  Vision? N  Difficulty concentrating or making decisions? N  Walking or climbing stairs? N  Dressing or bathing? N  Doing errands, shopping? N  Preparing Food and eating ? N  Using  the Toilet? N  In the past six months, have you accidently leaked urine? N  Do you have problems with loss of bowel control? N  Managing your Medications? N  Managing your Finances? N  Housekeeping or managing your Housekeeping? N  Some recent data might be hidden    Patient Care Team: Biagio Borg, MD as PCP - General (Internal Medicine)    Assessment:    Educated on prediabetes  Educated regarding prediabetes and numbers;  A1c ranges from 5.8 to 6.5 or fasting Blood sugar > 115 -126; (126 is diabetic)   Risk: >45yo; family hx; overweight or obese; African American; Hispanic; Latino; American [place] American; [place]; history of diabetes when pregnant; or birth to  a baby weighing over 9 lbs. Being less physically active than 30 minutes; 3 times a week;   Prevention; Losing a modest 7 to 8 lbs; If over 200 lbs; 10 to 14 lbs;  Choose healthier foods; colorful veggies; fish or lean meats; drinks water Reduce portion size Start exercising; 30 minutes of fast walking x 30 minutes per day/ 60 min for weight loss   Given information on weight loss strategies as well as mediterranean diet   Weight bearing work; calicum and vit d; 0000000 in food per day to avoid bone loss   PAP; still has uterus; not doing anymore  Will call gyn and see if she needs to come in  Will Dr. Gertie Fey  Educated to check with insurance regarding coverage of Shingles vaccination on Part D or Part B and may have lower co-pay if provided on the Part D side  Advanced directive; no;  Copy of HCPOA   Check your BP Minimal Blood Pressure Goal= AVERAGE < 140/90; Ideal is an AVERAGE < 135/85. This AVERAGE should be calculated from @ least 5-7 BP readings taken @ different times of day on different days of week. You should not respond to isolated BP readings , but rather the AVERAGE for that week .Please bring your blood pressure cuff to office visits to verify that it is reliable.It can also be checked  against the blood pressure device at the pharmacy. Finger or wrist cuffs are not dependable; an arm cuff is.  Also, monitor sodium intake in label of any canned food, as well as sodium in common everyday condiments; ketchup; salad dressing, sauces and any fast food restaurant.   Exercise Activities and Dietary recommendations Current Exercise Habits: Home exercise routine, Time (Minutes): 60, Frequency (Times/Week): 4, Weekly Exercise (Minutes/Week): 240, Intensity: Moderate (extremely active )  Goals    . patient          Will increase walking when able;  OR Get a pedometer!    . Weight (lb) < 180 lb (81.6 kg)          Will keep cutting back on soft drinks Will diary x 3 days / [email or web address]  Calorie Counting for Weight Loss Calories are energy you get from the things you eat and drink. Your body uses this energy to keep you going throughout the day. The number of calories you eat affects your weight. When you eat more calories than your body needs, your body stores the extra calories as fat. When you eat fewer calories than your body needs, your body burns fat to get the energy it needs. Calorie counting means keeping track of how many calories you eat and drink each day. If you make sure to eat fewer calories than your body needs, you should lose weight. In order for calorie counting to work, you will need to eat the number of calories that are right for you in a day to lose a healthy amount of weight per week. A healthy amount of weight to lose per week is usually 1-2 lb (0.5-0.9 kg). A dietitian can determine how many calories you need in a day and give you suggestions on how to reach your calorie goal.  WHAT IS MY MY PLAN? My goal is to have __________ calories per day.  If I have this many calories per day, I should lose around __________ pounds per week. WHAT DO I NEED TO KNOW ABOUT CALORIE COUNTING? In order to meet your daily calorie goal, you will  need to:  Find out how many  calories are in each food you would like to eat. Try to do this before you eat.  Decide how much of the food you can eat.  Write down what you ate and how many calories it had. Doing this is called keeping a food log. WHERE DO I FIND CALORIE INFORMATION? The number of calories in a food can be found on a Nutrition Facts label. Note that all the information on a label is based on a specific serving of the food. If a food does not have a Nutrition Facts label, try to look up the calories online or ask your dietitian for help. HOW DO I DECIDE HOW MUCH TO EAT? To decide how much of the food you can eat, you will need to consider both the number of calories in one serving and the size of one serving. This information can be found on the Nutrition Facts label. If a food does not have a Nutrition Facts label, look up the information online or ask your dietitian for help. Remember that calories are listed per serving. If you choose to have more than one serving of a food, you will have to multiply the calories per serving by the amount of servings you plan to eat. For example, the label on a package of bread might say that a serving size is 1 slice and that there are 90 calories in a serving. If you eat 1 slice, you will have eaten 90 calories. If you eat 2 slices, you will have eaten 180 calories. HOW DO I KEEP A FOOD LOG? After each meal, record the following information in your food log:  What you ate.  How much of it you ate.  How many calories it had.  Then, add up your calories. Keep your food log near you, such as in a small notebook in your pocket. Another option is to use a mobile app or website. Some programs will calculate calories for you and show you how many calories you have left each time you add an item to the log. WHAT ARE SOME CALORIE COUNTING TIPS?  Use your calories on foods and drinks that will fill you up and not leave you hungry. Some examples of this include foods like nuts  and nut butters, vegetables, lean proteins, and high-fiber foods (more than 5 g fiber per serving).  Eat nutritious foods and avoid empty calories. Empty calories are calories you get from foods or beverages that do not have many nutrients, such as candy and soda. It is better to have a nutritious high-calorie food (such as an avocado) than a food with few nutrients (such as a bag of chips).  Know how many calories are in the foods you eat most often. This way, you do not have to look up how many calories they have each time you eat them.  Look out for foods that may seem like low-calorie foods but are really high-calorie foods, such as baked goods, soda, and fat-free candy.  Pay attention to calories in drinks. Drinks such as sodas, specialty coffee drinks, alcohol, and juices have a lot of calories yet do not fill you up. Choose low-calorie drinks like water and diet drinks.  Focus your calorie counting efforts on higher calorie items. Logging the calories in a garden salad that contains only vegetables is less important than calculating the calories in a milk shake.  Find a way of tracking calories that works for you. Get  creative. Most people who are successful find ways to keep track of how much they eat in a day, even if they do not count every calorie. WHAT ARE SOME PORTION CONTROL TIPS?  Know how many calories are in a serving. This will help you know how many servings of a certain food you can have.  Use a measuring cup to measure serving sizes. This is helpful when you start out. With time, you will be able to estimate serving sizes for some foods.  Take some time to put servings of different foods on your favorite plates, bowls, and cups so you know what a serving looks like.  Try not to eat straight from a bag or box. Doing this can lead to overeating. Put the amount you would like to eat in a cup or on a plate to make sure you are eating the right portion.  Use smaller plates,  glasses, and bowls to prevent overeating. This is a quick and easy way to practice portion control. If your plate is smaller, less food can fit on it.  Try not to multitask while eating, such as watching TV or using your computer. If it is time to eat, sit down at a table and enjoy your food. Doing this will help you to start recognizing when you are full. It will also make you more aware of what and how much you are eating. HOW CAN I CALORIE COUNT WHEN EATING OUT?  Ask for smaller portion sizes or child-sized portions.  Consider sharing an entree and sides instead of getting your own entree.  If you get your own entree, eat only half. Ask for a box at the beginning of your meal and put the rest of your entree in it so you are not tempted to eat it.  Look for the calories on the menu. If calories are listed, choose the lower calorie options.  Choose dishes that include vegetables, fruits, whole grains, low-fat dairy products, and lean protein. Focusing on smart food choices from each of the 5 food groups can help you stay on track at restaurants.  Choose items that are boiled, broiled, grilled, or steamed.  Choose water, milk, unsweetened iced tea, or other drinks without added sugars. If you want an alcoholic beverage, choose a lower calorie option. For example, a regular margarita can have up to 700 calories and a glass of wine has around 150.  Stay away from items that are buttered, battered, fried, or served with cream sauce. Items labeled "crispy" are usually fried, unless stated otherwise.  Ask for dressings, sauces, and syrups on the side. These are usually very high in calories, so do not eat much of them.  Watch out for salads. Many people think salads are a healthy option, but this is often not the case. Many salads come with bacon, fried chicken, lots of cheese, fried chips, and dressing. All of these items have a lot of calories. If you want a salad, choose a garden salad and ask  for grilled meats or steak. Ask for the dressing on the side, or ask for olive oil and vinegar or lemon to use as dressing.  Estimate how many servings of a food you are given. For example, a serving of cooked rice is  cup or about the size of half a tennis ball or one cupcake wrapper. Knowing serving sizes will help you be aware of how much food you are eating at restaurants. The list below tells you how big  or small some common portion sizes are based on everyday objects.  1 oz--4 stacked dice.  3 oz--1 deck of cards.  1 tsp--1 dice.  1 Tbsp-- a Ping-Pong ball.  2 Tbsp--1 Ping-Pong ball.   cup--1 tennis ball or 1 cupcake wrapper.  1 cup--1 baseball.   This information is not intended to replace advice given to you by your health care provider. Make sure you discuss any questions you have with your health care provider.   Document Released: 06/18/2005 Document Revised: 07/09/2014 Document Reviewed: 04/23/2013 Elsevier Interactive Patient Education 2016 Elsevier Inc[employer]        Fall Risk Fall Risk  01/31/2016 10/26/2015 10/08/2014 10/08/2013 08/14/2013  Falls in the past year? No No Yes Yes No  Number falls in past yr: - - 1 1 -  Injury with Fall? - - No Yes -   Depression Screen PHQ 2/9 Scores 01/31/2016 10/26/2015 10/08/2014 10/08/2013  PHQ - 2 Score 0 0 0 0     Cognitive Testing No flowsheet data found.   Ad8 score 0   Immunization History  Administered Date(s) Administered  . Influenza Split 05/04/2011, 04/28/2012  . Influenza,inj,Quad PF,36+ Mos 05/18/2013, 04/09/2014, 04/15/2015  . Pneumococcal Conjugate-13 10/08/2014  . Pneumococcal Polysaccharide-23 05/04/2011  . Tdap 04/28/2012   Screening Tests Health Maintenance  Topic Date Due  . ZOSTAVAX  02/22/2006  . OPHTHALMOLOGY EXAM  04/03/2015  . INFLUENZA VACCINE  01/31/2016  . HEMOGLOBIN A1C  04/26/2016  . FOOT EXAM  10/25/2016  . URINE MICROALBUMIN  10/25/2016  . MAMMOGRAM  04/07/2017  . COLONOSCOPY  09/12/2021  .  TETANUS/TDAP  04/28/2022  . DEXA SCAN  Completed  . Hepatitis C Screening  Completed  . PNA vac Low Risk Adult  Completed      Plan:   Check for BP  Minimal Blood Pressure Goal= AVERAGE < 140/90; Ideal is an AVERAGE < 135/85. This AVERAGE should be calculated from @ least 5-7 BP readings taken @ different times of day on different days of week. You should not respond to isolated BP readings , but rather the AVERAGE for that week .Please bring your blood pressure cuff to office visits to verify that it is reliable.It can also be checked against the blood pressure device at the pharmacy. Finger or wrist cuffs are not dependable; an arm cuff is.  Also, monitor sodium intake in label of any canned food, as well as sodium in common everyday condiments; ketchup; salad dressing, sauces and any fast food restaurant.   See assessment for other education  Will call GYN regarding fup of possible pap.  Will discuss shingles vaccine at the next fup with Dr. [name] Reichmann  During the course of the visit the patient was educated and counseled about the following appropriate screening and preventive services:   Vaccines to include Pneumoccal, Influenza, Hepatitis B, Td, Zostavax, HCV  Hesitant to take zostavax; was educated  Electrocardiogram  Cardiovascular Disease/ educated on risk factors  Colorectal cancer screening  Bone density screening/ discussed and educated ostepenia   Diabetes screening/ watching diet   Glaucoma screening/eye exam neg; Dr. Idolina Primer; will call and get result   Mammography/neg fall 2016; repeated annually   Nutrition counseling/ given strategies for weight loss  Patient Instructions (the written plan) was given to the patient.   [id number], RN  01/31/2016  Medical screening examination/treatment/procedure(s) were performed by non-physician practitioner and as supervising physician I was immediately available for consultation/collaboration. I agree with above. Cathlean Cower, MD

## 2016-01-31 ENCOUNTER — Ambulatory Visit (INDEPENDENT_AMBULATORY_CARE_PROVIDER_SITE_OTHER): Payer: Commercial Managed Care - HMO

## 2016-01-31 VITALS — BP 140/80 | HR 81 | Resp 98 | Ht 63.0 in | Wt 203.0 lb

## 2016-01-31 DIAGNOSIS — Z0000 Encounter for general adult medical examination without abnormal findings: Secondary | ICD-10-CM | POA: Diagnosis not present

## 2016-01-31 NOTE — Patient Instructions (Addendum)
[name] , Thank you for taking time to come for your Medicare Wellness Visit. I appreciate your ongoing commitment to your health goals. Please review the following plan we discussed and let me know if I can assist you in the future.   These are the goals we discussed: Goals    . patient          Will increase walking when able;  OR Get a pedometer!    . Weight (lb) < 180 lb (81.6 kg)          Will keep cutting back on soft drinks Will diary x 3 days / [email or web address]  Calorie Counting for Weight Loss Calories are energy you get from the things you eat and drink. Your body uses this energy to keep you going throughout the day. The number of calories you eat affects your weight. When you eat more calories than your body needs, your body stores the extra calories as fat. When you eat fewer calories than your body needs, your body burns fat to get the energy it needs. Calorie counting means keeping track of how many calories you eat and drink each day. If you make sure to eat fewer calories than your body needs, you should lose weight. In order for calorie counting to work, you will need to eat the number of calories that are right for you in a day to lose a healthy amount of weight per week. A healthy amount of weight to lose per week is usually 1-2 lb (0.5-0.9 kg). A dietitian can determine how many calories you need in a day and give you suggestions on how to reach your calorie goal.  WHAT IS MY MY PLAN? My goal is to have __________ calories per day.  If I have this many calories per day, I should lose around __________ pounds per week. WHAT DO I NEED TO KNOW ABOUT CALORIE COUNTING? In order to meet your daily calorie goal, you will need to:  Find out how many calories are in each food you would like to eat. Try to do this before you eat.  Decide how much of the food you can eat.  Write down what you ate and how many calories it had. Doing this is called keeping a food  log. WHERE DO I FIND CALORIE INFORMATION? The number of calories in a food can be found on a Nutrition Facts label. Note that all the information on a label is based on a specific serving of the food. If a food does not have a Nutrition Facts label, try to look up the calories online or ask your dietitian for help. HOW DO I DECIDE HOW MUCH TO EAT? To decide how much of the food you can eat, you will need to consider both the number of calories in one serving and the size of one serving. This information can be found on the Nutrition Facts label. If a food does not have a Nutrition Facts label, look up the information online or ask your dietitian for help. Remember that calories are listed per serving. If you choose to have more than one serving of a food, you will have to multiply the calories per serving by the amount of servings you plan to eat. For example, the label on a package of bread might say that a serving size is 1 slice and that there are 90 calories in a serving. If you eat 1 slice, you will have eaten 90 calories. If you  eat 2 slices, you will have eaten 180 calories. HOW DO I KEEP A FOOD LOG? After each meal, record the following information in your food log:  What you ate.  How much of it you ate.  How many calories it had.  Then, add up your calories. Keep your food log near you, such as in a small notebook in your pocket. Another option is to use a mobile app or website. Some programs will calculate calories for you and show you how many calories you have left each time you add an item to the log. WHAT ARE SOME CALORIE COUNTING TIPS?  Use your calories on foods and drinks that will fill you up and not leave you hungry. Some examples of this include foods like nuts and nut butters, vegetables, lean proteins, and high-fiber foods (more than 5 g fiber per serving).  Eat nutritious foods and avoid empty calories. Empty calories are calories you get from foods or beverages that do  not have many nutrients, such as candy and soda. It is better to have a nutritious high-calorie food (such as an avocado) than a food with few nutrients (such as a bag of chips).  Know how many calories are in the foods you eat most often. This way, you do not have to look up how many calories they have each time you eat them.  Look out for foods that may seem like low-calorie foods but are really high-calorie foods, such as baked goods, soda, and fat-free candy.  Pay attention to calories in drinks. Drinks such as sodas, specialty coffee drinks, alcohol, and juices have a lot of calories yet do not fill you up. Choose low-calorie drinks like water and diet drinks.  Focus your calorie counting efforts on higher calorie items. Logging the calories in a garden salad that contains only vegetables is less important than calculating the calories in a milk shake.  Find a way of tracking calories that works for you. Get creative. Most people who are successful find ways to keep track of how much they eat in a day, even if they do not count every calorie. WHAT ARE SOME PORTION CONTROL TIPS?  Know how many calories are in a serving. This will help you know how many servings of a certain food you can have.  Use a measuring cup to measure serving sizes. This is helpful when you start out. With time, you will be able to estimate serving sizes for some foods.  Take some time to put servings of different foods on your favorite plates, bowls, and cups so you know what a serving looks like.  Try not to eat straight from a bag or box. Doing this can lead to overeating. Put the amount you would like to eat in a cup or on a plate to make sure you are eating the right portion.  Use smaller plates, glasses, and bowls to prevent overeating. This is a quick and easy way to practice portion control. If your plate is smaller, less food can fit on it.  Try not to multitask while eating, such as watching TV or using  your computer. If it is time to eat, sit down at a table and enjoy your food. Doing this will help you to start recognizing when you are full. It will also make you more aware of what and how much you are eating. HOW CAN I CALORIE COUNT WHEN EATING OUT?  Ask for smaller portion sizes or child-sized portions.  Consider  sharing an entree and sides instead of getting your own entree.  If you get your own entree, eat only half. Ask for a box at the beginning of your meal and put the rest of your entree in it so you are not tempted to eat it.  Look for the calories on the menu. If calories are listed, choose the lower calorie options.  Choose dishes that include vegetables, fruits, whole grains, low-fat dairy products, and lean protein. Focusing on smart food choices from each of the 5 food groups can help you stay on track at restaurants.  Choose items that are boiled, broiled, grilled, or steamed.  Choose water, milk, unsweetened iced tea, or other drinks without added sugars. If you want an alcoholic beverage, choose a lower calorie option. For example, a regular margarita can have up to 700 calories and a glass of wine has around 150.  Stay away from items that are buttered, battered, fried, or served with cream sauce. Items labeled "crispy" are usually fried, unless stated otherwise.  Ask for dressings, sauces, and syrups on the side. These are usually very high in calories, so do not eat much of them.  Watch out for salads. Many people think salads are a healthy option, but this is often not the case. Many salads come with bacon, fried chicken, lots of cheese, fried chips, and dressing. All of these items have a lot of calories. If you want a salad, choose a garden salad and ask for grilled meats or steak. Ask for the dressing on the side, or ask for olive oil and vinegar or lemon to use as dressing.  Estimate how many servings of a food you are given. For example, a serving of cooked rice is   cup or about the size of half a tennis ball or one cupcake wrapper. Knowing serving sizes will help you be aware of how much food you are eating at restaurants. The list below tells you how big or small some common portion sizes are based on everyday objects.  1 oz--4 stacked dice.  3 oz--1 deck of cards.  1 tsp--1 dice.  1 Tbsp-- a Ping-Pong ball.  2 Tbsp--1 Ping-Pong ball.   cup--1 tennis ball or 1 cupcake wrapper.  1 cup--1 baseball.   This information is not intended to replace advice given to you by your health care provider. Make sure you discuss any questions you have with your health care provider.   Document Released: 06/18/2005 Document Revised: 07/09/2014 Document Reviewed: 04/23/2013 Elsevier Interactive Patient Education [employer].         This is a list of the screening recommended for you and due dates:  Health Maintenance  Topic Date Due  . Shingles Vaccine  02/22/2006  . Eye exam for diabetics  04/03/2015  . Flu Shot  01/31/2016  . Hemoglobin A1C  04/26/2016  . Complete foot exam   10/25/2016  . Urine Protein Check  10/25/2016  . Mammogram  04/07/2017  . Colon Cancer Screening  09/12/2021  . Tetanus Vaccine  04/28/2022  . DEXA scan (bone density measurement)  Completed  .  Hepatitis C: One time screening is recommended by Center for Disease Control  (CDC) for  adults born from [birth year] through 1965.   Completed  . Pneumonia vaccines  Completed       Fall Prevention in the Home  Falls can cause injuries. They can happen to people of all ages. There are many things you can do to  make your home safe and to help prevent falls.  WHAT CAN I DO ON THE OUTSIDE OF MY HOME?  Regularly fix the edges of walkways and driveways and fix any cracks.  Remove anything that might make you trip as you walk through a door, such as a raised step or threshold.  Trim any bushes or trees on the path to your home.  Use bright outdoor lighting.  Clear any  walking paths of anything that might make someone trip, such as rocks or tools.  Regularly check to see if handrails are loose or broken. Make sure that both sides of any steps have handrails.  Any raised decks and porches should have guardrails on the edges.  Have any leaves, snow, or ice cleared regularly.  Use sand or salt on walking paths during winter.  Clean up any spills in your garage right away. This includes oil or grease spills. WHAT CAN I DO IN THE BATHROOM?   Use night lights.  Install grab bars by the toilet and in the tub and shower. Do not use towel bars as grab bars.  Use non-skid mats or decals in the tub or shower.  If you need to sit down in the shower, use a plastic, non-slip stool.  Keep the floor dry. Clean up any water that spills on the floor as soon as it happens.  Remove soap buildup in the tub or shower regularly.  Attach bath mats securely with double-sided non-slip rug tape.  Do not have throw rugs and other things on the floor that can make you trip. WHAT CAN I DO IN THE BEDROOM?  Use night lights.  Make sure that you have a light by your bed that is easy to reach.  Do not use any sheets or blankets that are too big for your bed. They should not hang down onto the floor.  Have a firm chair that has side arms. You can use this for support while you get dressed.  Do not have throw rugs and other things on the floor that can make you trip. WHAT CAN I DO IN THE KITCHEN?  Clean up any spills right away.  Avoid walking on wet floors.  Keep items that you use a lot in easy-to-reach places.  If you need to reach something above you, use a strong step stool that has a grab bar.  Keep electrical cords out of the way.  Do not use floor polish or wax that makes floors slippery. If you must use wax, use non-skid floor wax.  Do not have throw rugs and other things on the floor that can make you trip. WHAT CAN I DO WITH MY STAIRS?  Do not leave  any items on the stairs.  Make sure that there are handrails on both sides of the stairs and use them. Fix handrails that are broken or loose. Make sure that handrails are as long as the stairways.  Check any carpeting to make sure that it is firmly attached to the stairs. Fix any carpet that is loose or worn.  Avoid having throw rugs at the top or bottom of the stairs. If you do have throw rugs, attach them to the floor with carpet tape.  Make sure that you have a light switch at the top of the stairs and the bottom of the stairs. If you do not have them, ask someone to add them for you. WHAT ELSE CAN I DO TO HELP PREVENT FALLS?  Wear shoes  that:  Do not have high heels.  Have rubber bottoms.  Are comfortable and fit you well.  Are closed at the toe. Do not wear sandals.  If you use a stepladder:  Make sure that it is fully opened. Do not climb a closed stepladder.  Make sure that both sides of the stepladder are locked into place.  Ask someone to hold it for you, if possible.  Clearly mark and make sure that you can see:  Any grab bars or handrails.  First and last steps.  Where the edge of each step is.  Use tools that help you move around (mobility aids) if they are needed. These include:  Canes.  Walkers.  Scooters.  Crutches.  Turn on the lights when you go into a dark area. Replace any light bulbs as soon as they burn out.  Set up your furniture so you have a clear path. Avoid moving your furniture around.  If any of your floors are uneven, fix them.  If there are any pets around you, be aware of where they are.  Review your medicines with your doctor. Some medicines can make you feel dizzy. This can increase your chance of falling. Ask your doctor what other things that you can do to help prevent falls.   This information is not intended to replace advice given to you by your health care provider. Make sure you discuss any questions you have with your  health care provider.   Document Released: 04/14/2009 Document Revised: 11/02/2014 Document Reviewed: 07/23/2014 Elsevier Interactive Patient Education 2016 [place] Maintenance, Female Adopting a healthy lifestyle and getting preventive care can go a long way to promote health and wellness. Talk with your health care provider about what schedule of regular examinations is right for you. This is a good chance for you to check in with your provider about disease prevention and staying healthy. In between checkups, there are plenty of things you can do on your own. Experts have done a lot of research about which lifestyle changes and preventive measures are most likely to keep you healthy. Ask your health care provider for more information. WEIGHT AND DIET  Eat a healthy diet  Be sure to include plenty of vegetables, fruits, low-fat dairy products, and lean protein.  Do not eat a lot of foods high in solid fats, added sugars, or salt.  Get regular exercise. This is one of the most important things you can do for your health.  Most adults should exercise for at least 150 minutes each week. The exercise should increase your heart rate and make you sweat (moderate-intensity exercise).  Most adults should also do strengthening exercises at least twice a week. This is in addition to the moderate-intensity exercise.  Maintain a healthy weight  Body mass index (BMI) is a measurement that can be used to identify possible weight problems. It estimates body fat based on height and weight. Your health care provider can help determine your BMI and help you achieve or maintain a healthy weight.  For females 28 years of age and older:   A BMI below 18.5 is considered underweight.  A BMI of 18.5 to 24.9 is normal.  A BMI of 25 to 29.9 is considered overweight.  A BMI of 30 and above is considered obese.  Watch levels of cholesterol and blood lipids  You should start having your  blood tested for lipids and cholesterol at 70 years of age, then have this test  every 5 years.  You may need to have your cholesterol levels checked more often if:  Your lipid or cholesterol levels are high.  You are older than 70 years of age.  You are at high risk for heart disease.  CANCER SCREENING   Lung Cancer  Lung cancer screening is recommended for adults 29-69 years old who are at high risk for lung cancer because of a history of smoking.  A yearly low-dose CT scan of the lungs is recommended for people who:  Currently smoke.  Have quit within the past 15 years.  Have at least a 30-pack-year history of smoking. A pack year is smoking an average of one pack of cigarettes a day for 1 year.  Yearly screening should continue until it has been 15 years since you quit.  Yearly screening should stop if you develop a health problem that would prevent you from having lung cancer treatment.  Breast Cancer  Practice breast self-awareness. This means understanding how your breasts normally appear and feel.  It also means doing regular breast self-exams. Let your health care provider know about any changes, no matter how small.  If you are in your 20s or 30s, you should have a clinical breast exam (CBE) by a health care provider every 1-3 years as part of a regular health exam.  If you are 36 or older, have a CBE every year. Also consider having a breast X-ray (mammogram) every year.  If you have a family history of breast cancer, talk to your health care provider about genetic screening.  If you are at high risk for breast cancer, talk to your health care provider about having an MRI and a mammogram every year.  Breast cancer gene (BRCA) assessment is recommended for women who have family members with BRCA-related cancers. BRCA-related cancers include:  Breast.  Ovarian.  Tubal.  Peritoneal cancers.  Results of the assessment will determine the need for genetic  counseling and BRCA1 and BRCA2 testing. Cervical Cancer Your health care provider may recommend that you be screened regularly for cancer of the pelvic organs (ovaries, uterus, and vagina). This screening involves a pelvic examination, including checking for microscopic changes to the surface of your cervix (Pap test). You may be encouraged to have this screening done every 3 years, beginning at age 46.  For women ages 53-65, health care providers may recommend pelvic exams and Pap testing every 3 years, or they may recommend the Pap and pelvic exam, combined with testing for human papilloma virus (HPV), every 5 years. Some types of HPV increase your risk of cervical cancer. Testing for HPV may also be done on women of any age with unclear Pap test results.  Other health care providers may not recommend any screening for nonpregnant women who are considered low risk for pelvic cancer and who do not have symptoms. Ask your health care provider if a screening pelvic exam is right for you.  If you have had past treatment for cervical cancer or a condition that could lead to cancer, you need Pap tests and screening for cancer for at least 20 years after your treatment. If Pap tests have been discontinued, your risk factors (such as having a new sexual partner) need to be reassessed to determine if screening should resume. Some women have medical problems that increase the chance of getting cervical cancer. In these cases, your health care provider may recommend more frequent screening and Pap tests. Colorectal Cancer  This type of cancer  can be detected and often prevented.  Routine colorectal cancer screening usually begins at 70 years of age and continues through 70 years of age.  Your health care provider may recommend screening at an earlier age if you have risk factors for colon cancer.  Your health care provider may also recommend using home test kits to check for hidden blood in the stool.  A  small camera at the end of a tube can be used to examine your colon directly (sigmoidoscopy or colonoscopy). This is done to check for the earliest forms of colorectal cancer.  Routine screening usually begins at age 23.  Direct examination of the colon should be repeated every 5-10 years through 70 years of age. However, you may need to be screened more often if early forms of precancerous polyps or small growths are found. Skin Cancer  Check your skin from head to toe regularly.  Tell your health care provider about any new moles or changes in moles, especially if there is a change in a mole's shape or color.  Also tell your health care provider if you have a mole that is larger than the size of a pencil eraser.  Always use sunscreen. Apply sunscreen liberally and repeatedly throughout the day.  Protect yourself by wearing long sleeves, pants, a wide-brimmed hat, and sunglasses whenever you are outside. HEART DISEASE, DIABETES, AND HIGH BLOOD PRESSURE   High blood pressure causes heart disease and increases the risk of stroke. High blood pressure is more likely to develop in:  People who have blood pressure in the high end of the normal range (130-139/85-89 mm Hg).  People who are overweight or obese.  People who are African American.  If you are 15-75 years of age, have your blood pressure checked every 3-5 years. If you are 38 years of age or older, have your blood pressure checked every year. You should have your blood pressure measured twice--once when you are at a hospital or clinic, and once when you are not at a hospital or clinic. Record the average of the two measurements. To check your blood pressure when you are not at a hospital or clinic, you can use:  An automated blood pressure machine at a pharmacy.  A home blood pressure monitor.  If you are between 59 years and 30 years old, ask your health care provider if you should take aspirin to prevent strokes.  Have  regular diabetes screenings. This involves taking a blood sample to check your fasting blood sugar level.  If you are at a normal weight and have a low risk for diabetes, have this test once every three years after 70 years of age.  If you are overweight and have a high risk for diabetes, consider being tested at a younger age or more often. PREVENTING INFECTION  Hepatitis B  If you have a higher risk for hepatitis B, you should be screened for this virus. You are considered at high risk for hepatitis B if:  You were born in a country where hepatitis B is common. Ask your health care provider which countries are considered high risk.  Your parents were born in a high-risk country, and you have not been immunized against hepatitis B (hepatitis B vaccine).  You have HIV or AIDS.  You use needles to inject street drugs.  You live with someone who has hepatitis B.  You have had sex with someone who has hepatitis B.  You get hemodialysis treatment.  You take  certain medicines for conditions, including cancer, organ transplantation, and autoimmune conditions. Hepatitis C  Blood testing is recommended for:  Everyone born from [birth year] through 1965.  Anyone with known risk factors for hepatitis C. Sexually transmitted infections (STIs)  You should be screened for sexually transmitted infections (STIs) including gonorrhea and chlamydia if:  You are sexually active and are younger than 70 years of age.  You are older than 70 years of age and your health care provider tells you that you are at risk for this type of infection.  Your sexual activity has changed since you were last screened and you are at an increased risk for chlamydia or gonorrhea. Ask your health care provider if you are at risk.  If you do not have HIV, but are at risk, it may be recommended that you take a prescription medicine daily to prevent HIV infection. This is called pre-exposure prophylaxis (PrEP). You are  considered at risk if:  You are sexually active and do not regularly use condoms or know the HIV status of your partner(s).  You take drugs by injection.  You are sexually active with a partner who has HIV. Talk with your health care provider about whether you are at high risk of being infected with HIV. If you choose to begin PrEP, you should first be tested for HIV. You should then be tested every 3 months for as long as you are taking PrEP.  PREGNANCY   If you are premenopausal and you may become pregnant, ask your health care provider about preconception counseling.  If you may become pregnant, take 400 to 800 micrograms (mcg) of folic acid every day.  If you want to prevent pregnancy, talk to your health care provider about birth control (contraception). OSTEOPOROSIS AND MENOPAUSE   Osteoporosis is a disease in which the bones lose minerals and strength with aging. This can result in serious bone fractures. Your risk for osteoporosis can be identified using a bone density scan.  If you are 54 years of age or older, or if you are at risk for osteoporosis and fractures, ask your health care provider if you should be screened.  Ask your health care provider whether you should take a calcium or vitamin D supplement to lower your risk for osteoporosis.  Menopause may have certain physical symptoms and risks.  Hormone replacement therapy may reduce some of these symptoms and risks. Talk to your health care provider about whether hormone replacement therapy is right for you.  HOME CARE INSTRUCTIONS   Schedule regular health, dental, and eye exams.  Stay current with your immunizations.   Do not use any tobacco products including cigarettes, chewing tobacco, or electronic cigarettes.  If you are pregnant, do not drink alcohol.  If you are breastfeeding, limit how much and how often you drink alcohol.  Limit alcohol intake to no more than 1 drink per day for nonpregnant women. One  drink equals 12 ounces of beer, 5 ounces of wine, or 1 ounces of hard liquor.  Do not use street drugs.  Do not share needles.  Ask your health care provider for help if you need support or information about quitting drugs.  Tell your health care provider if you often feel depressed.  Tell your health care provider if you have ever been abused or do not feel safe at home.   This information is not intended to replace advice given to you by your health care provider. Make sure you discuss any questions  you have with your health care provider.   Document Released: 01/01/2011 Document Revised: 07/09/2014 Document Reviewed: 05/20/2013 Elsevier Interactive Patient Education [employer].

## 2016-02-01 ENCOUNTER — Telehealth: Payer: Self-pay | Admitting: Internal Medicine

## 2016-02-01 NOTE — Telephone Encounter (Signed)
Patient Name: Debra Atkinson  DOB: 12-11-1945    Initial Comment just had bowel movement , stool was green, even when she wipes. like easter color egg green, normal BM, does not have gallbladder   Nurse Assessment  Nurse: Mallie Mussel, RN, Alveta Heimlich Date/Time Eilene Ghazi Time): 02/01/2016 12:10:16 PM  Confirm and document reason for call. If symptomatic, describe symptoms. You must click the next button to save text entered. ---Caller states that she has had a green stool this morning. She has not eaten anything yet. She states that she was seen yesterday for her wellness appointment. She does not have a gall bladder. The stool was formed.  Has the patient traveled out of the country within the last 30 days? ---No  Does the patient have any new or worsening symptoms? ---Yes  Will a triage be completed? ---Yes  Related visit to physician within the last 2 weeks? ---No  Does the PT have any chronic conditions? (i.e. diabetes, asthma, etc.) ---No  Is this a behavioral health or substance abuse call? ---No     Guidelines    Guideline Title Affirmed Question Affirmed Notes  Stools - Unusual Color Green stools (all triage questions negative)    Final Disposition User   Lockwood, RN, Alveta Heimlich    Disagree/Comply: Leta Baptist

## 2016-02-14 ENCOUNTER — Telehealth: Payer: Self-pay | Admitting: Internal Medicine

## 2016-02-14 NOTE — Telephone Encounter (Signed)
Patient states she feels like she has a possible UTI.  States her sides are hurting and she has burning when she urinates.  There are no available appointments for today.  Do we need to send patient to another office or can patient go to lab?

## 2016-02-14 NOTE — Telephone Encounter (Signed)
Ok to see wed as addon

## 2016-02-15 ENCOUNTER — Encounter: Payer: Self-pay | Admitting: Family Medicine

## 2016-02-15 ENCOUNTER — Ambulatory Visit (INDEPENDENT_AMBULATORY_CARE_PROVIDER_SITE_OTHER): Payer: Commercial Managed Care - HMO | Admitting: Family Medicine

## 2016-02-15 ENCOUNTER — Ambulatory Visit (INDEPENDENT_AMBULATORY_CARE_PROVIDER_SITE_OTHER)
Admission: RE | Admit: 2016-02-15 | Discharge: 2016-02-15 | Disposition: A | Discharge status: Home / Self Care | Payer: Commercial Managed Care - HMO | Source: Ambulatory Visit | Attending: Family Medicine | Admitting: Family Medicine

## 2016-02-15 VITALS — BP 140/90 | HR 78 | Resp 12 | Ht 63.0 in | Wt 200.5 lb

## 2016-02-15 DIAGNOSIS — M545 Low back pain, unspecified: Secondary | ICD-10-CM

## 2016-02-15 DIAGNOSIS — R1031 Right lower quadrant pain: Secondary | ICD-10-CM

## 2016-02-15 DIAGNOSIS — R109 Unspecified abdominal pain: Secondary | ICD-10-CM | POA: Diagnosis not present

## 2016-02-15 LAB — CBC WITH DIFFERENTIAL/PLATELET
BASOS ABS: 0 10*3/uL (ref 0.0–0.1)
Basophils Relative: 0.4 % (ref 0.0–3.0)
EOS ABS: 0.1 10*3/uL (ref 0.0–0.7)
Eosinophils Relative: 0.9 % (ref 0.0–5.0)
HEMATOCRIT: 41.1 % (ref 36.0–46.0)
HEMOGLOBIN: 13.7 g/dL (ref 12.0–15.0)
LYMPHS PCT: 38.6 % (ref 12.0–46.0)
Lymphs Abs: 3 10*3/uL (ref 0.7–4.0)
MCHC: 33.4 g/dL (ref 30.0–36.0)
MCV: 83 fl (ref 78.0–100.0)
MONOS PCT: 7.6 % (ref 3.0–12.0)
Monocytes Absolute: 0.6 10*3/uL (ref 0.1–1.0)
NEUTROS ABS: 4.1 10*3/uL (ref 1.4–7.7)
Neutrophils Relative %: 52.5 % (ref 43.0–77.0)
PLATELETS: 320 10*3/uL (ref 150.0–400.0)
RBC: 4.96 Mil/uL (ref 3.87–5.11)
RDW: 13.7 % (ref 11.5–15.5)
WBC: 7.8 10*3/uL (ref 4.0–10.5)

## 2016-02-15 LAB — POCT URINALYSIS DIPSTICK
Bilirubin, UA: NEGATIVE
GLUCOSE UA: NEGATIVE
Ketones, UA: NEGATIVE
LEUKOCYTES UA: NEGATIVE
NITRITE UA: NEGATIVE
PH UA: 6
Protein, UA: NEGATIVE
RBC UA: NEGATIVE
UROBILINOGEN UA: 0.2

## 2016-02-15 LAB — COMPREHENSIVE METABOLIC PANEL
ALBUMIN: 4.3 g/dL (ref 3.5–5.2)
ALK PHOS: 91 U/L (ref 39–117)
ALT: 16 U/L (ref 0–35)
AST: 14 U/L (ref 0–37)
BILIRUBIN TOTAL: 0.4 mg/dL (ref 0.2–1.2)
BUN: 17 mg/dL (ref 6–23)
CALCIUM: 9.9 mg/dL (ref 8.4–10.5)
CO2: 28 mEq/L (ref 19–32)
Chloride: 103 mEq/L (ref 96–112)
Creatinine, Ser: 0.76 mg/dL (ref 0.40–1.20)
GFR: 79.97 mL/min (ref >60.00)
Glucose, Bld: 109 mg/dL — ABNORMAL HIGH (ref 70–99)
Potassium: 4 mEq/L (ref 3.5–5.1)
Sodium: 138 mEq/L (ref 135–145)
TOTAL PROTEIN: 7.4 g/dL (ref 6.0–8.3)

## 2016-02-15 MED ORDER — KETOROLAC TROMETHAMINE 60 MG/2ML IM SOLN
60.0000 mg | Freq: Once | INTRAMUSCULAR | Status: AC
  Administered 2016-02-15: 60 mg via INTRAMUSCULAR

## 2016-02-15 NOTE — Progress Notes (Signed)
Pre visit review using our clinic review tool, if applicable. No additional management support is needed unless otherwise documented below in the visit note. 
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
## 2016-02-15 NOTE — Patient Instructions (Addendum)
  [name] I have seen you today for an acute visit because your primary care provider was not available.  A few things to remember from today's visit:   Right lower quadrant abdominal pain - Plan: POCT urinalysis dipstick, CBC with Differential/Platelet, CMP, DG Abd 2 Views  Right-sided low back pain without sciatica - Plan: POCT urinalysis dipstick  Urine negative.  Please be sure medication list is accurate. If a new problem present, please set up appointment sooner than planned today.   Monitor for signs of worsening symptoms and seek immediate medical attention if any concerning/warning symptom as we discussed.    Avoid foods that make your symptoms worse, for example coffee, chocolate,pepermeint,alcohol, and greasy food. Raising the head of your bed about 6 inches may help with nocturnal symptoms.  Bland liquid diet for 24-48 hours and advance as tolerated. Avoid greasy food, no sodas or juices just water.  More frequent meals and smaller during the day.  Some medications we recommend for acid reflux treatment (proton pump inhibitors) can cause some problems in the long term: increase risk of osteoporosis, vitamin deficiencies,pneumonia, and more recently discovered that it can increase the risk of chronic kidney disease and might increase risk of dementia.     GET HELP RIGHT AWAY IF:   The is severe pain that does not go away within 2 hours.  Sudden severe/worsening pain.  You keep throwing up (vomiting).  The pain changes and is only in the right or left part of the belly.  Not being able to pass gas or poop.  You have bloody or tarry looking poop.   MAKE SURE YOU:   Understand these instructions.  Will watch your condition.  Will get help right away if you are not doing well or get worse.    Follow in 7-10 days with PCP , sooner if needed.

## 2016-02-15 NOTE — Telephone Encounter (Signed)
Scheduled patient for this morning at [facility] with Dr. Martinique.

## 2016-02-16 ENCOUNTER — Telehealth: Payer: Self-pay | Admitting: Internal Medicine

## 2016-02-16 NOTE — Telephone Encounter (Signed)
Called and patient and gave her lab results & x-ray results. Patient verbalized understanding. & advised to call if she has any questions.

## 2016-02-16 NOTE — Telephone Encounter (Signed)
Pt saw dr Martinique and would like blood work results also xray results

## 2016-02-17 ENCOUNTER — Telehealth: Payer: Self-pay

## 2016-02-17 NOTE — Telephone Encounter (Signed)
Called and spoke to patient. She doesn't understand why she needs to take the metamcuil. She said that she is able to go to the bathroom perfectly fine. I advised her that I could send you a message, but that you are out of the office. She said that the pain is better, her lower back is still hurting a bit, but everything has isn't bad. I advised her that I would probably not have a response from you until Monday, and she verbalized understanding. I advised her that if she has any abdominal pain or if she feels like something is not right, to head over to the ER.

## 2016-02-17 NOTE — Telephone Encounter (Signed)
Pt would like a call back to clarify these results.  Pt has been up all night worried about this. Would like a call back please.  Phone; [phone]

## 2016-02-19 NOTE — Telephone Encounter (Signed)
My recommendation was based on imaging report: moderate amount of stool, which sometimes can cause abdominal pain. I thought Metamucil could help without aggravating loose stools [which she reported as chronic-s/p cholecystectomy].  She can further discuss this during follow-up appt [I believe I recommended following with PCP].  Thanks, BJ

## 2016-02-20 NOTE — Telephone Encounter (Signed)
Patient called back. Spoke with her and we went over your response below. I told her that the Metamucil is a recommendation, and that she can follow up with Dr. [name] Reichmann. I advised her to ask Dr. [name] Reichmann and they can repeat the x-ray just to make sure everything is gone & to ease her concerns. Patient verbalized understanding.

## 2016-02-20 NOTE — Telephone Encounter (Signed)
Called and spoke with patient. Everything is documented in a separate telephone call.

## 2016-02-20 NOTE — Telephone Encounter (Signed)
Left voicemail on patient's personal voicemail. Let her know your response and to follow with Dr. [name] Reichmann or let him look at the x-ray and see what he thinks.

## 2016-02-28 ENCOUNTER — Encounter: Payer: Self-pay | Admitting: Internal Medicine

## 2016-02-28 ENCOUNTER — Ambulatory Visit (INDEPENDENT_AMBULATORY_CARE_PROVIDER_SITE_OTHER): Payer: Commercial Managed Care - HMO | Admitting: Internal Medicine

## 2016-02-28 VITALS — BP 132/72 | HR 69 | Resp 20 | Wt 195.0 lb

## 2016-02-28 DIAGNOSIS — Z800 Family history of malignant neoplasm of digestive organs: Secondary | ICD-10-CM

## 2016-02-28 DIAGNOSIS — E119 Type 2 diabetes mellitus without complications: Secondary | ICD-10-CM

## 2016-02-28 DIAGNOSIS — J309 Allergic rhinitis, unspecified: Secondary | ICD-10-CM | POA: Diagnosis not present

## 2016-02-28 DIAGNOSIS — K5900 Constipation, unspecified: Secondary | ICD-10-CM | POA: Diagnosis not present

## 2016-02-28 DIAGNOSIS — E785 Hyperlipidemia, unspecified: Secondary | ICD-10-CM

## 2016-02-28 HISTORY — DX: Family history of malignant neoplasm of digestive organs: Z80.0

## 2016-02-28 MED ORDER — TRIAMCINOLONE ACETONIDE 55 MCG/ACT NA AERO: 2.0000 | INHALATION_SPRAY | Freq: Every day | NASAL | 12 refills | Status: DC

## 2016-02-28 MED ORDER — CETIRIZINE HCL 10 MG PO TABS: 10.0000 mg | ORAL_TABLET | Freq: Every day | ORAL | 11 refills | Status: DC

## 2016-02-28 NOTE — Assessment & Plan Note (Signed)
Mild to mod, for zyrtec, nasacort asd,  to f/u any worsening symptoms or concerns 

## 2016-02-28 NOTE — Assessment & Plan Note (Signed)
Will be due for colonoscopy with [facility] in 08/2016

## 2016-02-28 NOTE — Assessment & Plan Note (Signed)
stable overall by history and exam, recent data reviewed with pt, and pt to continue medical treatment as before,  to f/u any worsening symptoms or concerns Lab Results  Component Value Date   LDLCALC 168 (H) 10/26/2015

## 2016-02-28 NOTE — Progress Notes (Signed)
Subjective:    Patient ID: Debra Atkinson, female    DOB: 05/16/1946, 70 y.o.   MRN: [id number]  HPI  Here to f/u after saw Dr Martinique for right lower bad discomfort with radiation towards the right lower back.  Eval with labs and xray and UA c/w mod stool burden only. Pt denies chest pain, increased sob or doe, wheezing, orthopnea, PND, increased LE swelling, palpitations, dizziness or syncope.  Pt denies new neurological symptoms such as new headache, or facial or extremity weakness or numbness No further lower back pain.  And right lower abd pain much improved and essentially resolved except for twinge this am for a few seconds.  Tx with metamucil.  No c/o n/v, fever, blood and Denies worsening reflux,  Other abd pain, dysphagia, diarrhea.  Also Does have several wks ongoing nasal allergy symptoms with clearish congestion, itch and sneezing, without fever, pain, ST, cough, swelling or wheezing.   Pt denies polydipsia, polyuria,  Past Medical History:  Diagnosis Date  . Concussion '06, '11  . Diabetes mellitus    diet management  . Family history of colon cancer 02/28/2016  . Fibromyalgia 10/08/2013  . Genital warts    treated podophyllin  . Hyperlipidemia    diet managed  . Varicella    Past Surgical History:  Procedure Laterality Date  . APPENDECTOMY  1979  . [facility]  . CHOLECYSTECTOMY  1979   laparotomy    reports that she has never smoked. She has never used smokeless tobacco. She reports that she does not drink alcohol or use drugs. family history includes COPD in her mother; Cancer (age of onset: 6) in her mother; Diabetes in her maternal grandfather and mother; Heart disease in her father and mother; Rheumatic fever in her father. Allergies  Allergen Reactions  . Prednisone Itching    Redness from injection   No current outpatient prescriptions on file prior to visit.   No current facility-administered medications on file prior to visit.      Review of  Systems  Constitutional: Negative for unusual diaphoresis or night sweats HENT: Negative for ear swelling or discharge Eyes: Negative for worsening visual haziness  Respiratory: Negative for choking and stridor.   Gastrointestinal: Negative for distension or worsening eructation Genitourinary: Negative for retention or change in urine volume.  Musculoskeletal: Negative for other MSK pain or swelling Skin: Negative for color change and worsening wound Neurological: Negative for tremors and numbness other than noted  Psychiatric/Behavioral: Negative for decreased concentration or agitation other than above       Objective:   Physical Exam BP 132/72   Pulse 69   Resp 20   Wt 195 lb (88.5 kg)   SpO2 95%   BMI 34.54 kg/m  VS noted,  Constitutional: Pt appears in no apparent distress HENT: Head: NCAT.  Right Ear: External ear normal.  Left Ear: External ear normal.  Eyes: . Pupils are equal, round, and reactive to light. Conjunctivae and EOM are normal Neck: Normal range of motion. Neck supple.  Cardiovascular: Normal rate and regular rhythm.   Pulmonary/Chest: Effort normal and breath sounds without rales or wheezing.  Abd:  Soft, NT, ND, + BS Neurological: Pt is alert. Not confused , motor grossly intact Skin: Skin is warm. No rash, no LE edema Psychiatric: Pt behavior is normal. No agitation.   Lab Results  Component Value Date   WBC 7.8 02/15/2016   HGB 13.7 02/15/2016   HCT 41.1 02/15/2016  PLT 320.0 02/15/2016   GLUCOSE 109 (H) 02/15/2016   CHOL 251 (H) 10/26/2015   TRIG 119.0 10/26/2015   HDL 59.40 10/26/2015   LDLDIRECT 163.8 05/18/2013   LDLCALC 168 (H) 10/26/2015   ALT 16 02/15/2016   AST 14 02/15/2016   NA 138 02/15/2016   K 4.0 02/15/2016   CL 103 02/15/2016   CREATININE 0.76 02/15/2016   BUN 17 02/15/2016   CO2 28 02/15/2016   TSH 1.81 10/26/2015   INR 0.92 05/24/2011   HGBA1C 6.3 10/26/2015   MICROALBUR 0.9 10/26/2015    ABDOMEN - 2 VIEW  02/15/2016 IMPRESSION: Negative exam with moderate stool volume.    Assessment & Plan:

## 2016-02-28 NOTE — Patient Instructions (Signed)
OK to continue the metamucil as needed   Please take all new medication as prescribed - the zyrtec and nasacort  Please continue all other medications as before, and refills have been done if requested.  Please have the pharmacy call with any other refills you may need.  Please keep your appointments with your specialists as you may have planned  Remember, you would be due for follow up colonoscopy with [facility] in March 2018

## 2016-02-28 NOTE — Assessment & Plan Note (Signed)
stable overall by history and exam, recent data reviewed with pt, and pt to continue medical treatment as before,  to f/u any worsening symptoms or concerns stable overall by history and exam, recent data reviewed with pt, and pt to continue medical treatment as before,  to f/u any worsening symptoms or concerns Lab Results  Component Value Date   HGBA1C 6.3 10/26/2015

## 2016-02-28 NOTE — Progress Notes (Signed)
Pre visit review using our clinic review tool, if applicable. No additional management support is needed unless otherwise documented below in the visit note. 

## 2016-02-29 DIAGNOSIS — Z01419 Encounter for gynecological examination (general) (routine) without abnormal findings: Secondary | ICD-10-CM | POA: Diagnosis not present

## 2016-03-07 ENCOUNTER — Ambulatory Visit: Payer: Commercial Managed Care - HMO | Admitting: Internal Medicine

## 2016-03-07 ENCOUNTER — Encounter: Payer: Self-pay | Admitting: Internal Medicine

## 2016-03-07 ENCOUNTER — Ambulatory Visit (INDEPENDENT_AMBULATORY_CARE_PROVIDER_SITE_OTHER): Payer: Commercial Managed Care - HMO | Admitting: Internal Medicine

## 2016-03-07 DIAGNOSIS — E119 Type 2 diabetes mellitus without complications: Secondary | ICD-10-CM | POA: Diagnosis not present

## 2016-03-07 DIAGNOSIS — R42 Dizziness and giddiness: Secondary | ICD-10-CM | POA: Diagnosis not present

## 2016-03-07 DIAGNOSIS — J309 Allergic rhinitis, unspecified: Secondary | ICD-10-CM | POA: Diagnosis not present

## 2016-03-07 MED ORDER — METHYLPREDNISOLONE 2 MG PO TABS: 2.0000 mg | ORAL_TABLET | Freq: Two times a day (BID) | ORAL | 0 refills | Status: DC

## 2016-03-07 MED ORDER — MECLIZINE HCL 12.5 MG PO TABS: 12.5000 mg | ORAL_TABLET | Freq: Three times a day (TID) | ORAL | 1 refills | Status: DC | PRN

## 2016-03-07 NOTE — Assessment & Plan Note (Addendum)
stable overall by history and exam, recent data reviewed with pt, and pt to continue medical treatment as before,  to f/u any worsening symptoms or concerns Lab Results  Component Value Date   HGBA1C 6.3 10/26/2015   Pt to call for any polys with taking medrol

## 2016-03-07 NOTE — Assessment & Plan Note (Signed)
Ok for cont'd nasacort and zyrtec, declines depomedrol due to "allergy" to prednisone, ok for medrol dose pack asd

## 2016-03-07 NOTE — Patient Instructions (Signed)
Please take all new medication as prescribed - the "medrol" for allergies  Please continue all other medications as before, and refills have been done if requested - the meclizine  Please have the pharmacy call with any other refills you may need.  Please continue your efforts at being more active, low cholesterol diet, and weight control  Please keep your appointments with your specialists as you may have planned  You will be contacted regarding the referral for: Neurology

## 2016-03-07 NOTE — Assessment & Plan Note (Signed)
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

## 2016-03-07 NOTE — Progress Notes (Signed)
Pre visit review using our clinic review tool, if applicable. No additional management support is needed unless otherwise documented below in the visit note. 

## 2016-03-07 NOTE — Progress Notes (Signed)
Subjective:    Patient ID: Debra Atkinson, female    DOB: 06/21/1946, 70 y.o.   MRN: [id number]  HPI    Here in f/u, with c/o worsening acute on chronic dizziness seemed to have gotten worse with nasacort and zyrtec use.(though nasal allergy symptoms improved).  Pt denies chest pain, increased sob or doe, wheezing, orthopnea, PND, increased LE swelling, palpitations, dizziness or syncope.  Pt denies new neurological symptoms such as new headache, or facial or extremity weakness or numbness.   Pt denies polydipsia, polyuria,   Is wondering if she needs MRI repeat to check on the "grey fluffy stuff" Past Medical History:  Diagnosis Date  . Concussion '06, '11  . Diabetes mellitus    diet management  . Family history of colon cancer 02/28/2016  . Fibromyalgia 10/08/2013  . Genital warts    treated podophyllin  . Hyperlipidemia    diet managed  . Varicella    Past Surgical History:  Procedure Laterality Date  . APPENDECTOMY  1979  . [place]  . CHOLECYSTECTOMY  1979   laparotomy    reports that she has never smoked. She has never used smokeless tobacco. She reports that she does not drink alcohol or use drugs. family history includes COPD in her mother; Cancer (age of onset: 10) in her mother; Diabetes in her maternal grandfather and mother; Heart disease in her father and mother; Rheumatic fever in her father. Allergies  Allergen Reactions  . Prednisone Itching    Redness from injection   Current Outpatient Prescriptions on File Prior to Visit  Medication Sig Dispense Refill  . cetirizine (ZYRTEC) 10 MG tablet Take 1 tablet (10 mg total) by mouth daily. 30 tablet 11  . triamcinolone (NASACORT AQ) 55 MCG/ACT AERO nasal inhaler Place 2 sprays into the nose daily. 1 Inhaler 12   No current facility-administered medications on file prior to visit.    Review of Systems  Constitutional: Negative for unusual diaphoresis or night sweats HENT: Negative for ear swelling or  discharge Eyes: Negative for worsening visual haziness  Respiratory: Negative for choking and stridor.   Gastrointestinal: Negative for distension or worsening eructation Genitourinary: Negative for retention or change in urine volume.  Musculoskeletal: Negative for other MSK pain or swelling Skin: Negative for color change and worsening wound Neurological: Negative for tremors and numbness other than noted  Psychiatric/Behavioral: Negative for decreased concentration or agitation other than above       Objective:   Physical Exam BP 130/70   Pulse 86   Temp 98 F (36.7 C) (Oral)   Resp 20   Wt 199 lb (90.3 kg)   SpO2 94%   BMI 35.25 kg/m  VS noted,  Constitutional: Pt appears in no apparent distress HENT: Head: NCAT.  Right Ear: External ear normal.  Left Ear: External ear normal.  Eyes: . Pupils are equal, round, and reactive to light. Conjunctivae and EOM are normal Neck: Normal range of motion. Neck supple.  Cardiovascular: Normal rate and regular rhythm.   Pulmonary/Chest: Effort normal and breath sounds without rales or wheezing.  Abd:  Soft, NT, ND, + BS Neurological: Pt is alert. Not confused , motor 5/5 intact, ftn normal, gait intact, cn 2-12 intact Skin: Skin is warm. No rash, no LE edema Psychiatric: Pt behavior is normal. No agitation.   MRI HEAD WITHOUT CONTRAST On: 07/12/2014  IMPRESSION: 1. No acute intracranial infarct or other abnormality identified. 2. 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

## 2016-03-22 ENCOUNTER — Encounter: Payer: Self-pay | Admitting: Neurology

## 2016-03-22 ENCOUNTER — Ambulatory Visit (INDEPENDENT_AMBULATORY_CARE_PROVIDER_SITE_OTHER): Payer: Commercial Managed Care - HMO | Admitting: Neurology

## 2016-03-22 VITALS — BP 124/68 | HR 94 | Ht 63.0 in | Wt 199.5 lb

## 2016-03-22 DIAGNOSIS — R42 Dizziness and giddiness: Secondary | ICD-10-CM

## 2016-03-22 DIAGNOSIS — G44219 Episodic tension-type headache, not intractable: Secondary | ICD-10-CM | POA: Diagnosis not present

## 2016-03-22 DIAGNOSIS — H539 Unspecified visual disturbance: Secondary | ICD-10-CM

## 2016-03-22 NOTE — Patient Instructions (Signed)
I think the headaches are tension headaches.  You may take ibuprofen to treat them if needed. I don't have a specific cause for the dizziness.  But you may be a little dehydrated.  Recommend keeping hydrated with drinking water throughout the day.
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
## 2016-04-23 ENCOUNTER — Encounter: Payer: Self-pay | Admitting: Internal Medicine

## 2016-04-23 DIAGNOSIS — Z1231 Encounter for screening mammogram for malignant neoplasm of breast: Secondary | ICD-10-CM | POA: Diagnosis not present

## 2016-04-23 DIAGNOSIS — Z803 Family history of malignant neoplasm of breast: Secondary | ICD-10-CM | POA: Diagnosis not present

## 2016-04-26 ENCOUNTER — Other Ambulatory Visit (INDEPENDENT_AMBULATORY_CARE_PROVIDER_SITE_OTHER): Payer: Commercial Managed Care - HMO

## 2016-04-26 ENCOUNTER — Encounter: Payer: Self-pay | Admitting: Internal Medicine

## 2016-04-26 ENCOUNTER — Ambulatory Visit (INDEPENDENT_AMBULATORY_CARE_PROVIDER_SITE_OTHER): Payer: Commercial Managed Care - HMO | Admitting: Internal Medicine

## 2016-04-26 ENCOUNTER — Ambulatory Visit: Payer: Commercial Managed Care - HMO | Admitting: Internal Medicine

## 2016-04-26 VITALS — BP 132/72 | HR 66 | Temp 98.5°F | Resp 20 | Wt 199.2 lb

## 2016-04-26 DIAGNOSIS — R42 Dizziness and giddiness: Secondary | ICD-10-CM

## 2016-04-26 DIAGNOSIS — E119 Type 2 diabetes mellitus without complications: Secondary | ICD-10-CM

## 2016-04-26 DIAGNOSIS — Z23 Encounter for immunization: Secondary | ICD-10-CM

## 2016-04-26 DIAGNOSIS — E785 Hyperlipidemia, unspecified: Secondary | ICD-10-CM | POA: Diagnosis not present

## 2016-04-26 DIAGNOSIS — J309 Allergic rhinitis, unspecified: Secondary | ICD-10-CM | POA: Diagnosis not present

## 2016-04-26 DIAGNOSIS — R221 Localized swelling, mass and lump, neck: Secondary | ICD-10-CM

## 2016-04-26 LAB — LIPID PANEL
CHOL/HDL RATIO: 5
Cholesterol: 284 mg/dL — ABNORMAL HIGH (ref 0–200)
HDL: 57.7 mg/dL (ref >39.00)
LDL CALC: 197 mg/dL — AB (ref 0–99)
NonHDL: 226.62
Triglycerides: 146 mg/dL (ref 0.0–149.0)
VLDL: 29.2 mg/dL (ref 0.0–40.0)

## 2016-04-26 LAB — BASIC METABOLIC PANEL
BUN: 15 mg/dL (ref 6–23)
CO2: 30 mEq/L (ref 19–32)
Calcium: 9.8 mg/dL (ref 8.4–10.5)
Chloride: 103 mEq/L (ref 96–112)
Creatinine, Ser: 0.77 mg/dL (ref 0.40–1.20)
GFR: 78.73 mL/min (ref >60.00)
Glucose, Bld: 108 mg/dL — ABNORMAL HIGH (ref 70–99)
POTASSIUM: 4.1 meq/L (ref 3.5–5.1)
SODIUM: 140 meq/L (ref 135–145)

## 2016-04-26 LAB — HEMOGLOBIN A1C: Hgb A1c MFr Bld: 6 % (ref 4.6–6.5)

## 2016-04-26 LAB — HEPATIC FUNCTION PANEL
ALK PHOS: 90 U/L (ref 39–117)
ALT: 21 U/L (ref 0–35)
AST: 16 U/L (ref 0–37)
Albumin: 4.2 g/dL (ref 3.5–5.2)
BILIRUBIN TOTAL: 0.6 mg/dL (ref 0.2–1.2)
Bilirubin, Direct: 0.1 mg/dL (ref 0.0–0.3)
Total Protein: 7.5 g/dL (ref 6.0–8.3)

## 2016-04-26 MED ORDER — MECLIZINE HCL 12.5 MG PO TABS: 12.5000 mg | ORAL_TABLET | Freq: Three times a day (TID) | ORAL | 1 refills | Status: DC | PRN

## 2016-04-26 MED ORDER — TRIAMCINOLONE ACETONIDE 55 MCG/ACT NA AERO: 2.0000 | INHALATION_SPRAY | Freq: Every day | NASAL | 12 refills | Status: DC

## 2016-04-26 MED ORDER — CETIRIZINE HCL 10 MG PO TABS: 10.0000 mg | ORAL_TABLET | Freq: Every day | ORAL | 11 refills | Status: DC

## 2016-04-26 MED ORDER — ASPIRIN EC 81 MG PO TBEC: 81.0000 mg | DELAYED_RELEASE_TABLET | Freq: Every day | ORAL | 11 refills | Status: DC

## 2016-04-26 NOTE — Progress Notes (Signed)
   Subjective:    Patient ID: Debra Atkinson, female    DOB: [birth year], 70 y.o.   MRN: [id number]  HPI  Here with onset dizziness recurrent x 2 wks without HA, focal neuro change/weakness most c/w swimmy headed and vertiginous. Pt denies chest pain, increased sob or doe, wheezing, orthopnea, PND, increased LE swelling, palpitations, or syncope.  Does have several wks ongoing nasal allergy symptoms with clearish congestion, itch and sneezing, without fever, pain, ST, cough, swelling or wheezing, but has had left ear discomfort and popping/crackling as well.   Pt denies polydipsia, polyuria, Pt states overall good compliance with meds Past Medical History:  Diagnosis Date  . Concussion '06, '11  . Diabetes mellitus    diet management  . Family history of colon cancer 02/28/2016  . Fibromyalgia 10/08/2013  . Genital warts    treated podophyllin  . Hyperlipidemia    diet managed  . Varicella    Past Surgical History:  Procedure Laterality Date  . APPENDECTOMY  1979  . [place]  . CHOLECYSTECTOMY  1979   laparotomy    reports that she has never smoked. She has never used smokeless tobacco. She reports that she does not drink alcohol or use drugs. family history includes COPD in her mother; Cancer (age of onset: 108) in her mother; Diabetes in her maternal grandfather and mother; Heart disease in her father and mother; Rheumatic fever in her father. Allergies  Allergen Reactions  . Prednisone Itching    Redness from injection   No current outpatient prescriptions on file prior to visit.   No current facility-administered medications on file prior to visit.    ,Review of Systems  Constitutional: Negative for unusual diaphoresis or night sweats HENT: Negative for ear swelling or discharge Eyes: Negative for worsening visual haziness  Respiratory: Negative for choking and stridor.   Gastrointestinal: Negative for distension or worsening eructation Genitourinary: Negative  for retention or change in urine volume.  Musculoskeletal: Negative for other MSK pain or swelling Skin: Negative for color change and worsening wound Neurological: Negative for tremors and numbness other than noted  Psychiatric/Behavioral: Negative for decreased concentration or agitation other than above   All other system neg per pt    Objective:   Physical Exam BP 132/72   Pulse 66   Temp 98.5 F (36.9 C) (Oral)   Resp 20   Wt 199 lb 4 oz (90.4 kg)   SpO2 96%   BMI 35.30 kg/m  VS noted,  Constitutional: Pt appears in no apparent distress HENT: Head: NCAT.  Right Ear: External ear normal.  Left Ear: External ear normal.  Eyes: . Pupils are equal, round, and reactive to light. Conjunctivae and EOM are normal Neck: Normal range of motion. Neck supple. Has a mass to right neck approx 1 cm below the jawline and just pre-SCM c/w rubbery, mobile but possible > 1 cm, no other mass or swelling noted Cardiovascular: Normal rate and regular rhythm.   Pulmonary/Chest: Effort normal and breath sounds without rales or wheezing.  Neurological: Pt is alert. Not confused , motor grossly intact Skin: Skin is warm. No rash, no LE edema Psychiatric: Pt behavior is normal. No agitation.     Assessment & Plan:

## 2016-04-26 NOTE — Patient Instructions (Addendum)
OK to take the Aspirin 81 mg per day to help reduce risk of future stroke and heart disease  Please take all new medication as prescribed - the zyrtec and nasacort, as well as the meclizine as needed  Please continue all other medications as before, and refills have been done if requested.  Please have the pharmacy call with any other refills you may need.  Please continue your efforts at being more active, low cholesterol diet, and weight control  Please keep your appointments with your specialists as you may have planned  You will be contacted regarding the referral for: carotid ultrasound  Please go to the LAB in the Basement (turn left off the elevator) for the tests to be done today  You will be contacted by phone if any changes need to be made immediately.  Otherwise, you will receive a letter about your results with an explanation, but please check with MyChart first.  Please remember to sign up for MyChart if you have not done so, as this will be important to you in the future with finding out test results, communicating by private email, and scheduling acute appointments online when needed.  Please return in 2 months, or sooner if needed (such as any neck lumps increased in size)

## 2016-04-26 NOTE — Progress Notes (Signed)
Pre visit review using our clinic review tool, if applicable. No additional management support is needed unless otherwise documented below in the visit note. 

## 2016-04-27 ENCOUNTER — Other Ambulatory Visit: Payer: Self-pay | Admitting: Internal Medicine

## 2016-04-27 MED ORDER — ROSUVASTATIN CALCIUM 20 MG PO TABS: 20.0000 mg | ORAL_TABLET | Freq: Every day | ORAL | 3 refills | Status: DC

## 2016-04-29 DIAGNOSIS — R221 Localized swelling, mass and lump, neck: Secondary | ICD-10-CM | POA: Insufficient documentation

## 2016-04-29 NOTE — Assessment & Plan Note (Addendum)
Etiology unclear, c/w vertigo most likely peripheral, but will check carotid dopplers, tx with trial of meclizine prn, consider vestibular rehab  Note:  Total time for pt hx, exam, review of record with pt in the room, determination of diagnoses and plan for further eval and tx is > 40 min, with over 50% spent in coordination and counseling of patient

## 2016-04-29 NOTE — Assessment & Plan Note (Signed)
One possible > 1 cm subq mass noted right neck, I suspect is LA possibly reactive, d/w pt, declines eval now but will return in 1 mo for reexam, consider neck CT and/or ENT referral

## 2016-04-29 NOTE — Assessment & Plan Note (Signed)
stable overall by history and exam, recent data prior to this date reviewed with pt, and pt to continue medical treatment as before,  to f/u any worsening symptoms or concerns, consider statin for ldl > 100

## 2016-04-29 NOTE — Assessment & Plan Note (Signed)
Mild to mod, for add otc zyrtec, nasacor and mucinex otc prn,  to f/u any worsening symptoms or concerns

## 2016-04-29 NOTE — Assessment & Plan Note (Signed)
stable overall by history and exam, recent data reviewed with pt, and pt to continue medical treatment as before,  to f/u any worsening symptoms or concerns Lab Results  Component Value Date   HGBA1C 6.0 04/26/2016

## 2016-05-22 ENCOUNTER — Telehealth: Payer: Self-pay | Admitting: Internal Medicine

## 2016-05-22 NOTE — Telephone Encounter (Signed)
[facility] - Client [place] [facility]  Patient Name: Debra Atkinson  DOB: 08/02/1945    Initial Comment Caller states she wants to know if she can get croup from grandchildren who will be visiting   Nurse Assessment  Nurse: Wynetta Emery, RN, Baker Janus Date/Time Eilene Ghazi Time): 05/22/2016 10:06:17 AM  Confirm and document reason for call. If symptomatic, describe symptoms. You must click the next button to save text entered. ---Did not reach caller-- left message that she can not contract croup from grandchildren but can contract an URI from droplets from cough (croup)  Does the patient have any new or worsening symptoms? ---No  Please document clinical information provided and list any resource used. ---Nurse left message     Guidelines    Guideline Title Affirmed Question Affirmed Notes       Final Disposition User   FINAL ATTEMPT MADE - message left Wynetta Emery, RN, Baker Janus    Referrals  GO TO FACILITY UNDECIDED

## 2016-06-22 ENCOUNTER — Ambulatory Visit (HOSPITAL_COMMUNITY)
Admission: RE | Admit: 2016-06-22 | Discharge: 2016-06-22 | Disposition: A | Discharge status: Home / Self Care | Payer: Commercial Managed Care - HMO | Source: Ambulatory Visit | Attending: Internal Medicine | Admitting: Internal Medicine

## 2016-06-22 DIAGNOSIS — I6523 Occlusion and stenosis of bilateral carotid arteries: Secondary | ICD-10-CM | POA: Insufficient documentation

## 2016-06-22 DIAGNOSIS — R42 Dizziness and giddiness: Secondary | ICD-10-CM | POA: Insufficient documentation

## 2016-06-22 LAB — VAS US CAROTID
LCCADSYS: -74 cm/s
LCCAPDIAS: 21 cm/s
LEFT ECA DIAS: -16 cm/s
LEFT VERTEBRAL DIAS: -13 cm/s
Left CCA dist dias: -17 cm/s
Left CCA prox sys: 96 cm/s
Left ICA dist dias: -26 cm/s
Left ICA dist sys: -68 cm/s
Left ICA prox dias: 24 cm/s
Left ICA prox sys: 90 cm/s
RCCADSYS: -111 cm/s
RCCAPDIAS: 20 cm/s
RCCAPSYS: 96 cm/s
RIGHT ECA DIAS: -23 cm/s
RIGHT VERTEBRAL DIAS: -13 cm/s

## 2016-06-22 NOTE — Progress Notes (Signed)
*  PRELIMINARY RESULTS* Vascular Ultrasound Carotid Duplex (Doppler) has been completed.  Preliminary findings: Bilateral 1-39% ICA stenosis, antegrade vertebral flow. Incidental finding: thyroid nodules, largest on the right.   Everrett Coombe 06/22/2016, 11:10 AM

## 2016-06-27 ENCOUNTER — Encounter: Payer: Self-pay | Admitting: Internal Medicine

## 2016-06-27 ENCOUNTER — Other Ambulatory Visit (INDEPENDENT_AMBULATORY_CARE_PROVIDER_SITE_OTHER): Payer: Commercial Managed Care - HMO

## 2016-06-27 ENCOUNTER — Ambulatory Visit (INDEPENDENT_AMBULATORY_CARE_PROVIDER_SITE_OTHER): Payer: Commercial Managed Care - HMO | Admitting: Internal Medicine

## 2016-06-27 VITALS — BP 132/78 | HR 101 | Temp 98.1°F | Resp 20 | Wt 197.0 lb

## 2016-06-27 DIAGNOSIS — R350 Frequency of micturition: Secondary | ICD-10-CM | POA: Diagnosis not present

## 2016-06-27 DIAGNOSIS — M25561 Pain in right knee: Secondary | ICD-10-CM | POA: Diagnosis not present

## 2016-06-27 DIAGNOSIS — E041 Nontoxic single thyroid nodule: Secondary | ICD-10-CM | POA: Diagnosis not present

## 2016-06-27 DIAGNOSIS — M25562 Pain in left knee: Secondary | ICD-10-CM | POA: Insufficient documentation

## 2016-06-27 DIAGNOSIS — E785 Hyperlipidemia, unspecified: Secondary | ICD-10-CM

## 2016-06-27 DIAGNOSIS — R7303 Prediabetes: Secondary | ICD-10-CM

## 2016-06-27 DIAGNOSIS — Z0001 Encounter for general adult medical examination with abnormal findings: Secondary | ICD-10-CM

## 2016-06-27 LAB — URINALYSIS, ROUTINE W REFLEX MICROSCOPIC
BILIRUBIN URINE: NEGATIVE
Ketones, ur: NEGATIVE
Nitrite: NEGATIVE
PH: 6 (ref 5.0–8.0)
Specific Gravity, Urine: 1.02 (ref 1.000–1.030)
Total Protein, Urine: NEGATIVE
Urine Glucose: NEGATIVE
Urobilinogen, UA: 0.2 (ref 0.0–1.0)

## 2016-06-27 NOTE — Progress Notes (Signed)
Pre visit review using our clinic review tool, if applicable. No additional management support is needed unless otherwise documented below in the visit note. 

## 2016-06-27 NOTE — Assessment & Plan Note (Signed)
C/w possible OAB, for urine studies, consider detrol LA but declines at this time.

## 2016-06-27 NOTE — Assessment & Plan Note (Signed)
Incidental finding on carotid dopplers, for thyroid US, consider biopsy for nodule > 1 cm

## 2016-06-27 NOTE — Assessment & Plan Note (Addendum)
Exam without significant abnormal today, but with recurrring c/o givaways, ? Significance, for sport med referral per pt request  Note:  Total time for pt hx, exam, review of record with pt in the room, determination of diagnoses and plan for further eval and tx is > 40 min, with over 50% spent in coordination and counseling of patient

## 2016-06-27 NOTE — Patient Instructions (Addendum)
Please continue all other medications as before, and refills have been done if requested.  Please call if you change your mind about starting a medication such as Detrol LA for the bladder.   Please have the pharmacy call with any other refills you may need.  Please continue your efforts at being more active, low cholesterol diet, and weight control.  Please keep your appointments with your specialists as you may have planned  You will be contacted regarding the referral for: thyroid ultrasound, and Dr Tamala Julian for the knees  Please go to the LAB in the Basement (turn left off the elevator) for the tests to be done today - just the urine testing today since your last lab work was October.  You will be contacted by phone if any changes need to be made immediately.  Otherwise, you will receive a letter about your results with an explanation, but please check with MyChart first.  Please remember to sign up for MyChart if you have not done so, as this will be important to you in the future with finding out test results, communicating by private email, and scheduling acute appointments online when needed.  Please return in 3 months, or sooner if needed, with Lab testing done 3-5 days before

## 2016-06-27 NOTE — Assessment & Plan Note (Signed)
Uncontrolled, declines statin or zetia,  to f/u any worsening symptoms or concerns

## 2016-06-27 NOTE — Assessment & Plan Note (Signed)
stable overall by history and exam, recent data reviewed with pt, and pt to continue medical treatment as before,  to f/u any worsening symptoms or concerns Lab Results  Component Value Date   HGBA1C 6.0 04/26/2016

## 2016-06-27 NOTE — Progress Notes (Signed)
Subjective:    Patient ID: Debra Atkinson, female    DOB: 1946-02-16, 70 y.o.   MRN: [id number]  HPI  Here to f/u; overall doing ok,  Pt denies chest pain, increasing sob or doe, wheezing, orthopnea, PND, increased LE swelling, palpitations, dizziness or syncope.  Pt denies new neurological symptoms such as new headache, or facial or extremity weakness or numbness.  Pt denies polydipsia, polyuria, or low sugar episode.   Pt denies new neurological symptoms such as new headache, or facial or extremity weakness or numbness.   Pt states overall good compliance with meds, mostly trying to follow appropriate diet, with wt overall stable Wt Readings from Last 3 Encounters:  06/27/16 197 lb (89.4 kg)  04/26/16 199 lb 4 oz (90.4 kg)  03/22/16 199 lb 8 oz (90.5 kg)  Did not want to start the crestor as she wont take the statins as she heard everyone says it causes cancer.  Has FMS already and does not want to chance the pain that might occur. Not takiang the asa as well, but willing to restart. Does have several motnhs ongoing nasal allergy symptoms with clearish congestion, itch and sneezing, without fever, pain, ST, cough, swelling or wheezing. No fever.  Still has ongoing recurring RUQ pain for lasts several visits, mild , intermittent, nothing really makes better or worse.  Has recurring crampy pains in the anterior chest as before, neg stress test June 2016.  Also states has intermittent knee giveaways either left or right at different times, but no falls, worsening knee swelling.    Also has bladder frequency worsening overall in the past 2 mo with 2-3 times nocturia as well, Denies urinary symptoms such as dysuria, frequency, urgency, flank pain, hematuria or n/v, fever, chills. Past Medical History:  Diagnosis Date  . Concussion '06, '11  . Diabetes mellitus    diet management  . Family history of colon cancer 02/28/2016  . Fibromyalgia 10/08/2013  . Genital warts    treated podophyllin  .  Hyperlipidemia    diet managed  . Varicella    Past Surgical History:  Procedure Laterality Date  . APPENDECTOMY  1979  . [place]  . CHOLECYSTECTOMY  1979   laparotomy    reports that she has never smoked. She has never used smokeless tobacco. She reports that she does not drink alcohol or use drugs. family history includes COPD in her mother; Cancer (age of onset: 62) in her mother; Diabetes in her maternal grandfather and mother; Heart disease in her father and mother; Rheumatic fever in her father. Allergies  Allergen Reactions  . Prednisone Itching    Redness from injection   Current Outpatient Prescriptions on File Prior to Visit  Medication Sig Dispense Refill  . aspirin EC 81 MG tablet Take 1 tablet (81 mg total) by mouth daily. 90 tablet 11  . cetirizine (ZYRTEC) 10 MG tablet Take 1 tablet (10 mg total) by mouth daily. 30 tablet 11  . meclizine (ANTIVERT) 12.5 MG tablet Take 1 tablet (12.5 mg total) by mouth 3 (three) times daily as needed for dizziness. 30 tablet 1  . rosuvastatin (CRESTOR) 20 MG tablet Take 1 tablet (20 mg total) by mouth daily. 90 tablet 3  . triamcinolone (NASACORT AQ) 55 MCG/ACT AERO nasal inhaler Place 2 sprays into the nose daily. 1 Inhaler 12   No current facility-administered medications on file prior to visit.    Review of Systems  Constitutional: Negative for unusual diaphoresis  or night sweats HENT: Negative for ear swelling or discharge Eyes: Negative for worsening visual haziness  Respiratory: Negative for choking and stridor.   Gastrointestinal: Negative for distension or worsening eructation Genitourinary: Negative for retention or change in urine volume.  Musculoskeletal: Negative for other MSK pain or swelling Skin: Negative for color change and worsening wound Neurological: Negative for tremors and numbness other than noted  Psychiatric/Behavioral: Negative for decreased concentration or agitation other than above   All  other system neg per pt    Objective:   Physical Exam  BP 132/78   Pulse (!) 101   Temp 98.1 F (36.7 C) (Oral)   Resp 20   Wt 197 lb (89.4 kg)   SpO2 95%   BMI 34.90 kg/m   VS noted,  Constitutional: Pt appears in no apparent distress HENT: Head: NCAT.  Right Ear: External ear normal.  Left Ear: External ear normal.  Eyes: . Pupils are equal, round, and reactive to light. Conjunctivae and EOM are normal Neck: Normal range of motion. Neck supple. unable to appreciate thyroid nodule Cardiovascular: Normal rate and regular rhythm.   Pulmonary/Chest: Effort normal and breath sounds without rales or wheezing.  Abd:  Soft, NT, ND, + BS Neurological: Pt is alert. Not confused , motor grossly intact Skin: Skin is warm. No rash, no LE edema Psychiatric: Pt behavior is normal. No agitation.   Carotid Duplex Study 06/22/2016  Head and neck:  Thyroid nodules noted, the largest nodule imaged.  Summary results - The vertebral arteries appear patent with antegrade flow. - Findings consistent with a 1-39 percent stenosis involving the   right internal carotid artery and the left internal carotid   artery. Minimal intimal thickening and echogenic plaque    Assessment & Plan:

## 2016-06-28 LAB — URINE CULTURE: ORGANISM ID, BACTERIA: NO GROWTH

## 2016-06-29 ENCOUNTER — Ambulatory Visit
Admission: RE | Admit: 2016-06-29 | Discharge: 2016-06-29 | Disposition: A | Discharge status: Home / Self Care | Payer: Commercial Managed Care - HMO | Source: Ambulatory Visit | Attending: Internal Medicine | Admitting: Internal Medicine

## 2016-06-29 DIAGNOSIS — E041 Nontoxic single thyroid nodule: Secondary | ICD-10-CM

## 2016-06-29 DIAGNOSIS — E042 Nontoxic multinodular goiter: Secondary | ICD-10-CM | POA: Diagnosis not present

## 2016-07-03 ENCOUNTER — Telehealth: Payer: Self-pay | Admitting: Internal Medicine

## 2016-07-03 NOTE — Telephone Encounter (Signed)
Patient is aware 

## 2016-07-03 NOTE — Telephone Encounter (Signed)
Patient requesting call back on thyroid ultrasound

## 2016-07-03 NOTE — Telephone Encounter (Signed)
I think letter was done today unless she is set up on mychart  Pt has several nodules to the thyroid, but none enlarged  We should consider repeat in 1 year

## 2016-07-16 ENCOUNTER — Ambulatory Visit: Payer: Commercial Managed Care - HMO | Admitting: Family Medicine
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
## 2016-07-30 ENCOUNTER — Encounter: Payer: Self-pay | Admitting: Family Medicine

## 2016-07-30 ENCOUNTER — Ambulatory Visit (INDEPENDENT_AMBULATORY_CARE_PROVIDER_SITE_OTHER)
Admission: RE | Admit: 2016-07-30 | Discharge: 2016-07-30 | Disposition: A | Discharge status: Home / Self Care | Payer: Medicare HMO | Source: Ambulatory Visit | Attending: Family Medicine | Admitting: Family Medicine

## 2016-07-30 ENCOUNTER — Ambulatory Visit (INDEPENDENT_AMBULATORY_CARE_PROVIDER_SITE_OTHER): Payer: Medicare HMO | Admitting: Family Medicine

## 2016-07-30 VITALS — BP 134/80 | HR 70 | Ht 63.0 in | Wt 197.0 lb

## 2016-07-30 DIAGNOSIS — M25562 Pain in left knee: Secondary | ICD-10-CM

## 2016-07-30 DIAGNOSIS — M170 Bilateral primary osteoarthritis of knee: Secondary | ICD-10-CM | POA: Diagnosis not present

## 2016-07-30 DIAGNOSIS — M25561 Pain in right knee: Secondary | ICD-10-CM

## 2016-07-30 DIAGNOSIS — S8992XA Unspecified injury of left lower leg, initial encounter: Secondary | ICD-10-CM | POA: Diagnosis not present

## 2016-07-30 NOTE — Patient Instructions (Signed)
Good to see yo u Lets get xray downstairs today  Ice 20 minutes 2 times daily. Usually after activity and before bed. pennsaid pinkie amount topically 2 times daily as needed.  Vitamin D 2000 Iu daily  Turmeric 500mg  1-2 times a day for pain when needed Tart cherry extract any dose at night I like the good shoes Exercises 3 times a week.  See me again in 4 weeks.

## 2016-07-30 NOTE — Assessment & Plan Note (Signed)
Moderate to severe arthritic changes of the knees bilaterally. Instability noted with valgus force. We discussed with patient at great length. Once a trying conservative therapy with home exercises, icing protocol, and topical anti-inflammatory's. X-rays ordered today for further evaluation. Patient come back and see me again in 4 weeks. Worsening symptoms consider injection.

## 2016-08-20 ENCOUNTER — Ambulatory Visit (INDEPENDENT_AMBULATORY_CARE_PROVIDER_SITE_OTHER): Payer: Medicare HMO | Admitting: Internal Medicine

## 2016-08-20 ENCOUNTER — Encounter: Payer: Self-pay | Admitting: Internal Medicine

## 2016-08-20 DIAGNOSIS — J309 Allergic rhinitis, unspecified: Secondary | ICD-10-CM

## 2016-08-20 NOTE — Progress Notes (Signed)
   Subjective:    Patient ID: Debra Atkinson, female    DOB: May 30, 1946, 71 y.o.   MRN: [id number]  HPI The patient is a 71 YO femal coming in for 3 days of sore throat. She is taking mucinex over the counter. Some mild cough. Denies fevers or chills. She does take zyrtec and nasacort but not regularly and took it Saturday but not Sunday or day of visit. She did not feel it had helped. No fevers or chills. No problems swallowing. No cough or neck swelling or pain. No sinus pressure or ear problems. Overall stable since onset.   Review of Systems  Constitutional: Negative for activity change, appetite change, chills, fatigue, fever and unexpected weight change.  HENT: Positive for congestion, postnasal drip, rhinorrhea and sore throat. Negative for drooling, ear discharge, ear pain, sinus pain, sinus pressure and trouble swallowing.   Eyes: Negative.   Respiratory: Negative for cough, chest tightness, shortness of breath and wheezing.   Cardiovascular: Negative.   Gastrointestinal: Negative.   Musculoskeletal: Negative.   Neurological: Negative.       Objective:   Physical Exam  Constitutional: She is oriented to person, place, and time. She appears well-developed and well-nourished.  HENT:  Head: Normocephalic and atraumatic.  Right Ear: External ear normal.  Left Ear: External ear normal.  Oropharynx with redness and clear drainage, no purulent discharge, no sinus pain.   Eyes: EOM are normal.  Neck: Normal range of motion. No JVD present.  Cardiovascular: Normal rate and regular rhythm.   Pulmonary/Chest: Effort normal and breath sounds normal.  Abdominal: Soft.  Lymphadenopathy:    She has no cervical adenopathy.  Neurological: She is alert and oriented to person, place, and time.  Skin: Skin is warm and dry.   Vitals:   08/20/16 1540  BP: (!) 160/90  Pulse: 99  Temp: 98.3 F (36.8 C)  TempSrc: Oral  SpO2: 99%  Weight: 208 lb (94.3 kg)  Height: 5\' 3"  (1.6 m)        Assessment & Plan:

## 2016-08-20 NOTE — Progress Notes (Signed)
Pre visit review using our clinic review tool, if applicable. No additional management support is needed unless otherwise documented below in the visit note. 

## 2016-08-20 NOTE — Patient Instructions (Signed)
I would recommend to start using the zyrtec and nasacort daily for the next 1-2 weeks for the drainage.

## 2016-08-21 NOTE — Assessment & Plan Note (Signed)
Could be mild viral illness versus allergy flare from weather change. Advised to take the zyrtec and nasacort daily for next 1-2 weeks. No indication of strep throat or infection and no antibiotics or steroids needed.

## 2016-08-29 ENCOUNTER — Ambulatory Visit: Payer: Medicare HMO | Admitting: Family Medicine

## 2016-08-29 DIAGNOSIS — H524 Presbyopia: Secondary | ICD-10-CM | POA: Diagnosis not present

## 2016-08-29 LAB — HM DIABETES EYE EXAM

## 2016-09-10 ENCOUNTER — Encounter: Payer: Self-pay | Admitting: *Deleted

## 2016-10-10 ENCOUNTER — Telehealth: Payer: Self-pay | Admitting: Internal Medicine

## 2016-10-13 ENCOUNTER — Emergency Department (HOSPITAL_COMMUNITY): Payer: Medicare HMO

## 2016-10-13 ENCOUNTER — Encounter (HOSPITAL_COMMUNITY): Payer: Self-pay | Admitting: Emergency Medicine

## 2016-10-13 ENCOUNTER — Emergency Department (HOSPITAL_COMMUNITY)
Admission: EM | Admit: 2016-10-13 | Discharge: 2016-10-13 | Disposition: A | Discharge status: Home / Self Care | Payer: Medicare HMO | Attending: Emergency Medicine | Admitting: Emergency Medicine

## 2016-10-13 DIAGNOSIS — Y999 Unspecified external cause status: Secondary | ICD-10-CM | POA: Insufficient documentation

## 2016-10-13 DIAGNOSIS — S92515A Nondisplaced fracture of proximal phalanx of left lesser toe(s), initial encounter for closed fracture: Secondary | ICD-10-CM | POA: Insufficient documentation

## 2016-10-13 DIAGNOSIS — Y9389 Activity, other specified: Secondary | ICD-10-CM | POA: Insufficient documentation

## 2016-10-13 DIAGNOSIS — Y929 Unspecified place or not applicable: Secondary | ICD-10-CM | POA: Insufficient documentation

## 2016-10-13 DIAGNOSIS — Z7982 Long term (current) use of aspirin: Secondary | ICD-10-CM | POA: Insufficient documentation

## 2016-10-13 DIAGNOSIS — S62647A Nondisplaced fracture of proximal phalanx of left little finger, initial encounter for closed fracture: Secondary | ICD-10-CM | POA: Diagnosis not present

## 2016-10-13 DIAGNOSIS — E119 Type 2 diabetes mellitus without complications: Secondary | ICD-10-CM | POA: Diagnosis not present

## 2016-10-13 DIAGNOSIS — W228XXA Striking against or struck by other objects, initial encounter: Secondary | ICD-10-CM | POA: Diagnosis not present

## 2016-10-13 DIAGNOSIS — S99922A Unspecified injury of left foot, initial encounter: Secondary | ICD-10-CM | POA: Diagnosis present

## 2016-10-13 DIAGNOSIS — S92912A Unspecified fracture of left toe(s), initial encounter for closed fracture: Secondary | ICD-10-CM

## 2016-10-13 MED ORDER — TRAMADOL HCL 50 MG PO TABS: 50.0000 mg | ORAL_TABLET | Freq: Four times a day (QID) | ORAL | 0 refills | Status: DC | PRN

## 2016-10-13 NOTE — Discharge Instructions (Signed)
Your x-ray shows that you have a spiral fracture of the left fifth toe this is treated with/by buddy taping her toes together and wearing a postop shoe to decrease movement for the next 7-10 days then as needed he was to begin a referral to orthopedic surgery if the symptoms get worse

## 2016-10-13 NOTE — ED Triage Notes (Signed)
Hit left pinky toe on a box 2 days ago.  Pain that radiates from toe to bottom of foot.  Bruising noted to top of foot.

## 2016-10-13 NOTE — ED Notes (Signed)
Ortho tech at bedside applying post op shoe.

## 2016-10-13 NOTE — ED Provider Notes (Signed)
[facility] DEPT Provider Note   CSN: [id number] Arrival date & time: 10/13/16  2146     History   Chief Complaint Chief Complaint  Patient presents with  . Toe Pain    HPI Debra Atkinson is a 70 y.o. female.  Patient states she stopped her toe yesterday become discolored and painful since that time no obvious deformity reported by patient.      Past Medical History:  Diagnosis Date  . Concussion '06, '11  . Diabetes mellitus    diet management  . Family history of colon cancer 02/28/2016  . Fibromyalgia 10/08/2013  . Genital warts    treated podophyllin  . Hyperlipidemia    diet managed  . Varicella     Patient Active Problem List   Diagnosis Date Noted  . 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 Thyroid nodule 06/27/2016  . Pain in both knees 06/27/2016  . Mass of right side of neck 04/29/2016  . Dizziness 03/07/2016  . Family history of colon cancer 02/28/2016  . Constipation 02/28/2016  . UTI (urinary tract infection) 07/29/2015  . Cough 05/05/2015  . Lower back pain 04/15/2015  . BPV (benign positional vertigo) 11/09/2014  . Atypical chest pain 10/08/2014  . Upper airway cough syndrome 06/08/2014  . Sinusitis, chronic 04/09/2014  . Allergic conjunctivitis 04/09/2014  . Urinary frequency 02/18/2014  . Eustachian tube dysfunction 12/04/2013  . Fibromyalgia 10/08/2013  . Low back pain 09/18/2012  . Allergic rhinitis 09/18/2012  . Need for prophylactic vaccination and inoculation against influenza 04/28/2012  . Encounter for preventative adult health care exam with abnormal findings 04/28/2012  . Pre-diabetes 04/25/2011  . Hyperlipidemia     Past Surgical History:  Procedure Laterality Date  . APPENDECTOMY  1979  . [place]  . CHOLECYSTECTOMY  1979   laparotomy    OB History    No data available       Home Medications    Prior to Admission medications   Medication Sig Start Date End Date Taking?  Authorizing Provider  aspirin EC 81 MG tablet Take 1 tablet (81 mg total) by mouth daily. Patient not taking: Reported on 08/20/2016 04/26/16   Biagio Borg, MD  cetirizine (ZYRTEC) 10 MG tablet Take 1 tablet (10 mg total) by mouth daily. 04/26/16 04/26/17  Biagio Borg, MD  meclizine (ANTIVERT) 12.5 MG tablet Take 1 tablet (12.5 mg total) by mouth 3 (three) times daily as needed for dizziness. Patient not taking: Reported on 08/20/2016 04/26/16 04/26/17  Biagio Borg, MD  rosuvastatin (CRESTOR) 20 MG tablet Take 1 tablet (20 mg total) by mouth daily. Patient not taking: Reported on 08/20/2016 04/27/16   Biagio Borg, MD  traMADol (ULTRAM) 50 MG tablet Take 1 tablet (50 mg total) by mouth every 6 (six) hours as needed. 10/13/16   Junius Creamer, NP  triamcinolone (NASACORT AQ) 55 MCG/ACT AERO nasal inhaler Place 2 sprays into the nose daily. 04/26/16   Biagio Borg, MD    Family History Family History  Problem Relation Age of Onset  . Diabetes Mother   . Heart disease Mother     CHF  . Cancer Mother 69    colon cancer  . COPD Mother   . Heart disease Father     CAD/MI  . Rheumatic fever Father   . Diabetes Maternal Grandfather     Social History Social History  Substance Use Topics  . Smoking status: Never Smoker  .  Smokeless tobacco: Never Used  . Alcohol use No     Allergies   Prednisone   Review of Systems Review of Systems  Constitutional: Negative for fever.  Musculoskeletal: Positive for joint swelling.  Skin: Positive for color change.  All other systems reviewed and are negative.    Physical Exam Updated Vital Signs BP (!) 143/75 (BP Location: Right Arm)   Pulse 66   Temp 97.8 F (36.6 C) (Oral)   Resp 16   Ht 5\' 3"  (1.6 m)   Wt 94.3 kg   SpO2 99%   BMI 36.85 kg/m   Physical Exam  Constitutional: She appears well-developed and well-nourished.  HENT:  Head: Normocephalic.  Eyes: Pupils are equal, round, and reactive to light.  Neck: Normal range of  motion.  Cardiovascular: Normal rate.   Pulmonary/Chest: Effort normal.  Musculoskeletal: Normal range of motion. She exhibits edema and tenderness. She exhibits no deformity.       Feet:  Skin: Skin is warm and dry.  Nursing note and vitals reviewed.    ED Treatments / Results  Labs (all labs ordered are listed, but only abnormal results are displayed) Labs Reviewed - No data to display  EKG  EKG Interpretation None       Radiology Dg Foot Complete Left  Result Date: 10/13/2016 CLINICAL DATA:  Trauma to fifth digit 2 days ago. EXAM: LEFT FOOT - COMPLETE 3+ VIEW COMPARISON:  Plain films of the ankle 04/11/2010 FINDINGS: Small calcaneal spur. A nondisplaced oblique fracture involves the proximal phalanx of the fifth digit. No intra-articular extension. IMPRESSION: Proximal phalanx fifth digit fracture. Electronically Signed   By: Abigail Miyamoto M.D.   On: 10/13/2016 23:33    Procedures Procedures (including critical care time)  Medications Ordered in ED Medications - No data to display   Initial Impression / Assessment and Plan / ED Course  I have reviewed the triage vital signs and the nursing notes.  Pertinent labs & imaging results that were available during my care of the patient were reviewed by me and considered in my medical decision making (see chart for details).     Patient has been buddy taped and placed in a postop shoe.  Follow-up with her primary care physician.  She was given Ultram for pain control   Final Clinical Impressions(s) / ED Diagnoses   Final diagnoses:  Closed nondisplaced fracture of phalanx of toe of left foot, unspecified toe, initial encounter    New Prescriptions Discharge Medication List as of 10/13/2016 11:24 PM    START taking these medications   Details  traMADol (ULTRAM) 50 MG tablet Take 1 tablet (50 mg total) by mouth every 6 (six) hours as needed., Starting Sat 10/13/2016, Print         Junius Creamer, NP 10/14/16 2030     Tanna Furry, MD 10/20/16 1718

## 2016-10-31 ENCOUNTER — Ambulatory Visit (INDEPENDENT_AMBULATORY_CARE_PROVIDER_SITE_OTHER): Payer: Medicare HMO | Admitting: Internal Medicine

## 2016-10-31 ENCOUNTER — Encounter: Payer: Self-pay | Admitting: Internal Medicine

## 2016-10-31 VITALS — BP 138/80 | HR 88 | Temp 99.1°F | Ht 63.0 in | Wt 196.0 lb

## 2016-10-31 DIAGNOSIS — Z0001 Encounter for general adult medical examination with abnormal findings: Secondary | ICD-10-CM

## 2016-10-31 DIAGNOSIS — M79675 Pain in left toe(s): Secondary | ICD-10-CM | POA: Insufficient documentation

## 2016-10-31 DIAGNOSIS — J069 Acute upper respiratory infection, unspecified: Secondary | ICD-10-CM | POA: Diagnosis not present

## 2016-10-31 DIAGNOSIS — M542 Cervicalgia: Secondary | ICD-10-CM

## 2016-10-31 DIAGNOSIS — J309 Allergic rhinitis, unspecified: Secondary | ICD-10-CM

## 2016-10-31 DIAGNOSIS — M7712 Lateral epicondylitis, left elbow: Secondary | ICD-10-CM

## 2016-10-31 MED ORDER — TRIAMCINOLONE ACETONIDE 55 MCG/ACT NA AERO: 2.0000 | INHALATION_SPRAY | Freq: Every day | NASAL | 12 refills | Status: DC

## 2016-10-31 MED ORDER — CETIRIZINE HCL 10 MG PO TABS: 10.0000 mg | ORAL_TABLET | Freq: Every day | ORAL | 11 refills | Status: DC

## 2016-10-31 MED ORDER — AZITHROMYCIN 250 MG PO TABS: ORAL_TABLET | ORAL | 1 refills | Status: DC

## 2016-10-31 MED ORDER — TIZANIDINE HCL 4 MG PO TABS: 4.0000 mg | ORAL_TABLET | Freq: Four times a day (QID) | ORAL | 1 refills | Status: DC | PRN

## 2016-10-31 NOTE — Assessment & Plan Note (Signed)
s/p proven fx left 5th toe,, foot o/w neurovasc intact, ok to change soft show cast back to regular shoe

## 2016-10-31 NOTE — Assessment & Plan Note (Signed)
Mild to mod, for depomedrol 80 IM,,  to f/u any worsening symptoms or concerns

## 2016-10-31 NOTE — Assessment & Plan Note (Signed)

## 2016-10-31 NOTE — Patient Instructions (Signed)
Please take all new medication as prescribed - the antibiotic, muscle relaxer, and mucinex DM for cough and congestion  OK to restart the zyrtec and nasacort  Please continue all other medications as before, and refills have been done if requested.  Please have the pharmacy call with any other refills you may need.  Please continue your efforts at being more active, low cholesterol diet, and weight control.  You are otherwise up to date with prevention measures today.  Please keep your appointments with your specialists as you may have planned  Please go to the LAB in the Basement (turn left off the elevator) for the tests to be done today  You will be contacted by phone if any changes need to be made immediately.  Otherwise, you will receive a letter about your results with an explanation, but please check with MyChart first.  Please remember to sign up for MyChart if you have not done so, as this will be important to you in the future with finding out test results, communicating by private email, and scheduling acute appointments online when needed.  Please return in 6 months, or sooner if needed

## 2016-10-31 NOTE — Assessment & Plan Note (Addendum)
Mild to mod, for antibx course,  to f/u any worsening symptoms or concerns  In addition to the time spent performing CPE, I spent an additional 40 minutes face to face,in which greater than 50% of this time was spent in counseling and coordination of care for patient's acute illness as documented., including acute upper resp infection diagnosis and tx, allergy diagnosis and treatment and subsequent oral med management, post neck pain differential and management, left foot pain evaluation and management, and left elbow pain diagnosis and tx

## 2016-10-31 NOTE — Progress Notes (Addendum)
Subjective:    Patient ID: Debra Atkinson, female    DOB: June 08, 1946, 71 y.o.   MRN: [id number]  HPI Here for wellness and f/u;  Overall doing ok;  Pt denies Chest pain, worsening SOB, DOE, wheezing, orthopnea, PND, worsening LE edema, palpitations, dizziness or syncope.  Pt denies neurological change such as new headache, facial or extremity weakness.  Pt denies polydipsia, polyuria, or low sugar symptoms. Pt states overall good compliance with treatment and medications, good tolerability, and has been trying to follow appropriate diet.  Pt denies worsening depressive symptoms, suicidal ideation or panic. No night sweats, wt loss, loss of appetite, or other constitutional symptoms.  Pt states good ability with ADL's, has low fall risk, home safety reviewed and adequate, no other significant changes in hearing or vision, and only occasionally active with exercise.  Also -  Here with 2-3 days acute onset fever, facial pain, pressure, headache, general weakness and malaise, and greenish d/c, with mild ST and cough, but pt denies chest pain, wheezing, increased sob or doe, orthopnea, PND, increased LE swelling, palpitations, dizziness or syncope.  Does have several wks ongoing nasal allergy symptoms with clearish congestion, itch and sneezing, without fever, pain, ST, cough, swelling or wheezing. Also with post right neck pain severe spasm twice in the last 2 days for no apparent reason, without radicular symptoms, only minor pain now, also with c/o pain to the lateral left foot after proven on imaging 5th toe fx and swelling, has been wearing soft shoe cast for 3 wks but does not allow her to walk the way she normally would with shoes.  Also with pain and tenderness of the left lateral epicondyle, mild without swelling or other LUE neurovasc symptoms Past Medical History:  Diagnosis Date  . Concussion '06, '11  . Diabetes mellitus    diet management  . Family history of colon cancer 02/28/2016  .  Fibromyalgia 10/08/2013  . Genital warts    treated podophyllin  . Hyperlipidemia    diet managed  . Varicella    Past Surgical History:  Procedure Laterality Date  . APPENDECTOMY  1979  . [place]  . CHOLECYSTECTOMY  1979   laparotomy    reports that she has never smoked. She has never used smokeless tobacco. She reports that she does not drink alcohol or use drugs. family history includes COPD in her mother; Cancer (age of onset: 96) in her mother; Diabetes in her maternal grandfather and mother; Heart disease in her father and mother; Rheumatic fever in her father. Allergies  Allergen Reactions  . Prednisone Itching    Redness from injection   No current outpatient prescriptions on file prior to visit.   No current facility-administered medications on file prior to visit.    Review of Systems Constitutional: Negative for other unusual diaphoresis, sweats, appetite or weight changes HENT: Negative for other worsening hearing loss, ear pain, facial swelling, mouth sores or neck stiffness.   Eyes: Negative for other worsening pain, redness or other visual disturbance.  Respiratory: Negative for other stridor or swelling Cardiovascular: Negative for other palpitations or other chest pain  Gastrointestinal: Negative for worsening diarrhea or loose stools, blood in stool, distention or other pain Genitourinary: Negative for hematuria, flank pain or other change in urine volume.  Musculoskeletal: Negative for myalgias or other joint swelling.  Skin: Negative for other color change, or other wound or worsening drainage.  Neurological: Negative for other syncope or numbness. Hematological: Negative for  other adenopathy or swelling Psychiatric/Behavioral: Negative for hallucinations, other worsening agitation, SI, self-injury, or new decreased concentration All other system negative    Objective:   Physical Exam BP 138/80   Pulse 88   Temp 99.1 F (37.3 C) (Oral)    Ht 5\' 3"  (1.6 m)   Wt 196 lb (88.9 kg)   SpO2 100%   BMI 34.72 kg/m  VS noted, mild ill appearing Constitutional: Pt is oriented to person, place, and time. Appears well-developed and well-nourished, in no significant distress and comfortable Head: Normocephalic and atraumatic  Eyes: Conjunctivae and EOM are normal. Pupils are equal, round, and reactive to light Right Ear: External ear normal without discharge Left Ear: External ear normal without discharge Bilat tm's with mild erythema.  Max sinus areas mild tender.  Pharynx with mild erythema, no exudate Nose: Nose without discharge or deformity Mouth/Throat: Oropharynx is without other ulcerations and moist  Neck: Normal range of motion. Neck supple. No JVD present. No tracheal deviation present or significant neck LA or mass Cardiovascular: Normal rate, regular rhythm, normal heart sounds and intact distal pulses.   Pulmonary/Chest: WOB normal and breath sounds without rales or wheezing  Abdominal: Soft. Bowel sounds are normal. NT. No HSM  Musculoskeletal: Normal range of motion. Exhibits no edema except for tender right post neck with paracervical spasm, no skin change or rash; left lateral foot without tender or swelling, but still has swelling significant left 5th toe without ulcer and o/w neurovasc intact, also also tender to left lateral epicondyle without swelling  Lymphadenopathy: Has no other cervical adenopathy.  Neurological: Pt is alert and oriented to person, place, and time. Pt has normal reflexes. No cranial nerve deficit. Motor grossly intact, Gait intact Skin: Skin is warm and dry. No rash noted or new ulcerations Psychiatric:  Has normal mood and affect. Behavior is normal without agitation No other exam findings    Assessment & Plan:

## 2016-10-31 NOTE — Assessment & Plan Note (Signed)
Mild to mod, c/w msk spasm, for muscle relaxer prn,  to f/u any worsening symptoms or concerns

## 2016-10-31 NOTE — Assessment & Plan Note (Signed)
Mild, ok for advil prn, declines sport med referral

## 2016-10-31 NOTE — Progress Notes (Signed)
Pre visit review using our clinic review tool, if applicable. No additional management support is needed unless otherwise documented below in the visit note. 

## 2016-11-01 ENCOUNTER — Other Ambulatory Visit (INDEPENDENT_AMBULATORY_CARE_PROVIDER_SITE_OTHER): Payer: Medicare HMO

## 2016-11-01 DIAGNOSIS — Z0001 Encounter for general adult medical examination with abnormal findings: Secondary | ICD-10-CM

## 2016-11-01 LAB — CBC WITH DIFFERENTIAL/PLATELET
BASOS ABS: 0 10*3/uL (ref 0.0–0.1)
BASOS PCT: 0.4 % (ref 0.0–3.0)
Eosinophils Absolute: 0.1 10*3/uL (ref 0.0–0.7)
Eosinophils Relative: 0.7 % (ref 0.0–5.0)
HCT: 40.6 % (ref 36.0–46.0)
Hemoglobin: 13.3 g/dL (ref 12.0–15.0)
LYMPHS ABS: 2.6 10*3/uL (ref 0.7–4.0)
Lymphocytes Relative: 27.2 % (ref 12.0–46.0)
MCHC: 32.8 g/dL (ref 30.0–36.0)
MCV: 83.3 fl (ref 78.0–100.0)
MONOS PCT: 10.4 % (ref 3.0–12.0)
Monocytes Absolute: 1 10*3/uL (ref 0.1–1.0)
NEUTROS ABS: 6 10*3/uL (ref 1.4–7.7)
NEUTROS PCT: 61.3 % (ref 43.0–77.0)
PLATELETS: 296 10*3/uL (ref 150.0–400.0)
RBC: 4.88 Mil/uL (ref 3.87–5.11)
RDW: 13.8 % (ref 11.5–15.5)
WBC: 9.7 10*3/uL (ref 4.0–10.5)

## 2016-11-01 LAB — HEPATIC FUNCTION PANEL
ALK PHOS: 82 U/L (ref 39–117)
ALT: 14 U/L (ref 0–35)
AST: 11 U/L (ref 0–37)
Albumin: 4 g/dL (ref 3.5–5.2)
BILIRUBIN DIRECT: 0.1 mg/dL (ref 0.0–0.3)
TOTAL PROTEIN: 7.1 g/dL (ref 6.0–8.3)
Total Bilirubin: 0.6 mg/dL (ref 0.2–1.2)

## 2016-11-01 LAB — BASIC METABOLIC PANEL
BUN: 15 mg/dL (ref 6–23)
CALCIUM: 9.4 mg/dL (ref 8.4–10.5)
CHLORIDE: 103 meq/L (ref 96–112)
CO2: 31 mEq/L (ref 19–32)
Creatinine, Ser: 0.76 mg/dL (ref 0.40–1.20)
GFR: 79.81 mL/min (ref >60.00)
Glucose, Bld: 111 mg/dL — ABNORMAL HIGH (ref 70–99)
Potassium: 4.1 mEq/L (ref 3.5–5.1)
SODIUM: 140 meq/L (ref 135–145)

## 2016-11-01 LAB — URINALYSIS, ROUTINE W REFLEX MICROSCOPIC
Bilirubin Urine: NEGATIVE
Ketones, ur: NEGATIVE
Nitrite: NEGATIVE
PH: 5.5 (ref 5.0–8.0)
Specific Gravity, Urine: >=1.03 — AB (ref 1.000–1.030)
TOTAL PROTEIN, URINE-UPE24: NEGATIVE
Urine Glucose: NEGATIVE
Urobilinogen, UA: 0.2 (ref 0.0–1.0)

## 2016-11-01 LAB — LIPID PANEL
Cholesterol: 233 mg/dL — ABNORMAL HIGH (ref 0–200)
HDL: 55.1 mg/dL (ref >39.00)
LDL Cholesterol: 153 mg/dL — ABNORMAL HIGH (ref 0–99)
NONHDL: 177.63
Total CHOL/HDL Ratio: 4
Triglycerides: 124 mg/dL (ref 0.0–149.0)
VLDL: 24.8 mg/dL (ref 0.0–40.0)

## 2016-11-01 LAB — TSH: TSH: 2.7 u[IU]/mL (ref 0.35–4.50)

## 2016-11-02 ENCOUNTER — Telehealth: Payer: Self-pay | Admitting: Internal Medicine

## 2016-11-02 MED ORDER — BENZONATATE 100 MG PO CAPS: ORAL_CAPSULE | ORAL | 1 refills | Status: DC

## 2016-11-02 NOTE — Telephone Encounter (Signed)
Dr. [name] Reichmann please advise. I looked in the [facility] for her CPE this week and I didn't see that a cough was addressed. Would she need another OV to get a script?

## 2016-11-02 NOTE — Telephone Encounter (Signed)
Pt called in and said that she has been coughing all day and would like to know if Dr [name] Reichmann would call in some cough meds for her?    Pharmacy [facility]

## 2016-11-02 NOTE — Telephone Encounter (Signed)
Ok for tessalon perle - done  erx 

## 2016-11-02 NOTE — Telephone Encounter (Signed)
Pt has been informed and expressed understanding.  

## 2016-11-02 NOTE — Addendum Note (Signed)
Addended by: Biagio Borg on: 11/02/2016 02:14 PM   Modules accepted: Orders

## 2016-11-03 ENCOUNTER — Other Ambulatory Visit: Payer: Self-pay | Admitting: Internal Medicine

## 2016-11-03 MED ORDER — ROSUVASTATIN CALCIUM 20 MG PO TABS: 20.0000 mg | ORAL_TABLET | Freq: Every day | ORAL | 3 refills | Status: DC

## 2016-11-03 MED ORDER — ASPIRIN 81 MG PO TBEC: 81.0000 mg | DELAYED_RELEASE_TABLET | Freq: Every day | ORAL | 12 refills | Status: DC

## 2016-11-05 ENCOUNTER — Telehealth: Payer: Self-pay | Admitting: Internal Medicine

## 2016-11-05 NOTE — Telephone Encounter (Signed)
Pt would like results from 5/3

## 2016-11-06 NOTE — Telephone Encounter (Signed)
-----   Message from Biagio Borg, MD sent at 11/03/2016  2:53 PM EDT ----- Left message on MyChart, pt to cont same tx except  The test results show that your current treatment is OK, except the LDL cholesterol is quite high.  On review of your medication lsit, you may not be taking Aspirin 81 mg per day.  This is recommended to you to start now and just keep taking.  Please also take a statin called Crestor 20 mg per day, to reduce the risk of heart disease and stroke.Redmond Baseman to please inform pt, I will do rx

## 2016-11-06 NOTE — Telephone Encounter (Signed)
Pt has been informed and expressed understanding but would like to know was it anything she needed to do about the glucose that is 111. She also stated that she is still congested after she has finished talking the antibiotic. Please advise.

## 2016-11-06 NOTE — Telephone Encounter (Signed)
Glucose 111 is extremely mild elevation, and does not require further evaluation or tx at this time.  It most likely was "reactive" due to the infection.  Ok to hold on further antibx for now unless there is worsening fever or pain. Thanks

## 2016-11-06 NOTE — Telephone Encounter (Signed)
Informed pt and she expressed understanding.  

## 2017-02-04 ENCOUNTER — Encounter: Payer: Self-pay | Admitting: Family Medicine

## 2017-02-04 ENCOUNTER — Ambulatory Visit (INDEPENDENT_AMBULATORY_CARE_PROVIDER_SITE_OTHER): Payer: Medicare HMO | Admitting: Family Medicine

## 2017-02-04 ENCOUNTER — Telehealth: Payer: Self-pay | Admitting: Internal Medicine

## 2017-02-04 VITALS — BP 140/88 | HR 90 | Temp 99.0°F | Ht 63.0 in | Wt 196.0 lb

## 2017-02-04 DIAGNOSIS — N390 Urinary tract infection, site not specified: Secondary | ICD-10-CM

## 2017-02-04 LAB — POC URINALSYSI DIPSTICK (AUTOMATED)
BILIRUBIN UA: NEGATIVE
Glucose, UA: NEGATIVE
KETONES UA: NEGATIVE
Nitrite, UA: NEGATIVE
PH UA: 6 (ref 5.0–8.0)
SPEC GRAV UA: 1.02 (ref 1.010–1.025)
Urobilinogen, UA: 0.2 E.U./dL

## 2017-02-04 MED ORDER — CIPROFLOXACIN HCL 500 MG PO TABS: 500.0000 mg | ORAL_TABLET | Freq: Two times a day (BID) | ORAL | 0 refills | Status: DC

## 2017-02-04 NOTE — Addendum Note (Signed)
Addended by: Aggie Hacker A on: 02/04/2017 12:20 PM   Modules accepted: Orders

## 2017-02-04 NOTE — Patient Instructions (Signed)
WE NOW OFFER   [facility] FAST TRACK!!!  SAME DAY Appointments for ACUTE CARE  Such as: Sprains, Injuries, cuts, abrasions, rashes, muscle pain, joint pain, back pain Colds, flu, sore throats, headache, allergies, cough, fever  Ear pain, sinus and eye infections Abdominal pain, nausea, vomiting, diarrhea, upset stomach Animal/insect bites  3 Easy Ways to Schedule: Walk-In Scheduling Call in scheduling Mychart Sign-up: [email or web address]         

## 2017-02-04 NOTE — Telephone Encounter (Signed)
Patient Name: Debra Atkinson  DOB: 10/23/1945    Initial Comment Caller states urinating frequently, having burning and lower back pain; **Line went to static and could not hear any response, disconnected and called this number which was not verified before line went dead but sounded like same voice, different name-[name].   Nurse Assessment  Nurse: Raphael Gibney, RN, Vanita Ingles Date/Time (Eastern Time): 02/04/2017 9:10:16 AM  Confirm and document reason for call. If symptomatic, describe symptoms. ---Caller states she is urinating frequently with burning with urination. Does not know if she has a fever or not. Symptoms started on Saturday. Has lower back pain.  Does the patient have any new or worsening symptoms? ---Yes  Will a triage be completed? ---Yes  Related visit to physician within the last 2 weeks? ---No  Does the PT have any chronic conditions? (i.e. diabetes, asthma, etc.) ---No  Is this a behavioral health or substance abuse call? ---No     Guidelines    Guideline Title Affirmed Question Affirmed Notes  Urination Pain - Female Side (flank) or lower back pain present    Final Disposition User   See Physician within 4 Hours (or PCP triage) Raphael Gibney, RN, Vera    Comments  No appts available at [place] scheduled at [facility] with Dr. Alysia Penna at 11 am 02/04/2017.   Referrals  REFERRED TO PCP OFFICE   Disagree/Comply: Comply

## 2017-02-04 NOTE — Progress Notes (Signed)
   Subjective:    Patient ID: Debra Atkinson, female    DOB: 20-Sep-1945, 71 y.o.   MRN: [id number]  HPI Here for 3 days of lower abdominal pressure, low back pain, urge to urinate and burning. No N or V. She does have a low grade fever.    Review of Systems  Constitutional: Positive for fever.  Respiratory: Negative.   Cardiovascular: Negative.   Gastrointestinal: Negative.   Genitourinary: Positive for dysuria, frequency and urgency. Negative for flank pain and hematuria.       Objective:   Physical Exam  Constitutional: She appears well-developed and well-nourished.  Cardiovascular: Normal rate, regular rhythm, normal heart sounds and intact distal pulses.   Pulmonary/Chest: Effort normal and breath sounds normal.  Abdominal: Soft. Bowel sounds are normal. She exhibits no distension and no mass. There is no tenderness. There is no rebound and no guarding.          Assessment & Plan:  UTI, treat with Cipro. Drink plenty of water. Culture the sample.  Alysia Penna, MD

## 2017-02-07 ENCOUNTER — Telehealth: Payer: Self-pay | Admitting: Internal Medicine

## 2017-02-07 LAB — URINE CULTURE

## 2017-02-07 NOTE — Telephone Encounter (Signed)
Pt saw dr fry on 02-04-17 and would like UA results

## 2017-02-08 NOTE — Telephone Encounter (Signed)
Pt is calling to see if she can the lab results from 02/04/17 today.

## 2017-02-08 NOTE — Telephone Encounter (Signed)
I tried to reach pt, mail box was full, sent pt a my chart message with results.

## 2017-02-08 NOTE — Telephone Encounter (Signed)
See my Result Note  

## 2017-02-13 ENCOUNTER — Telehealth: Payer: Self-pay | Admitting: Internal Medicine

## 2017-02-13 ENCOUNTER — Other Ambulatory Visit (INDEPENDENT_AMBULATORY_CARE_PROVIDER_SITE_OTHER): Payer: Medicare HMO

## 2017-02-13 ENCOUNTER — Encounter: Payer: Self-pay | Admitting: Internal Medicine

## 2017-02-13 ENCOUNTER — Ambulatory Visit (INDEPENDENT_AMBULATORY_CARE_PROVIDER_SITE_OTHER): Payer: Medicare HMO | Admitting: Internal Medicine

## 2017-02-13 ENCOUNTER — Telehealth: Payer: Self-pay

## 2017-02-13 ENCOUNTER — Other Ambulatory Visit: Payer: Self-pay | Admitting: Internal Medicine

## 2017-02-13 ENCOUNTER — Ambulatory Visit (INDEPENDENT_AMBULATORY_CARE_PROVIDER_SITE_OTHER)
Admission: RE | Admit: 2017-02-13 | Discharge: 2017-02-13 | Disposition: A | Discharge status: Home / Self Care | Payer: Medicare HMO | Source: Ambulatory Visit | Attending: Internal Medicine | Admitting: Internal Medicine

## 2017-02-13 VITALS — BP 144/80 | HR 68 | Ht 63.0 in | Wt 197.0 lb

## 2017-02-13 DIAGNOSIS — R5383 Other fatigue: Secondary | ICD-10-CM | POA: Insufficient documentation

## 2017-02-13 DIAGNOSIS — R7303 Prediabetes: Secondary | ICD-10-CM | POA: Diagnosis not present

## 2017-02-13 DIAGNOSIS — E785 Hyperlipidemia, unspecified: Secondary | ICD-10-CM | POA: Diagnosis not present

## 2017-02-13 DIAGNOSIS — N3000 Acute cystitis without hematuria: Secondary | ICD-10-CM

## 2017-02-13 DIAGNOSIS — R05 Cough: Secondary | ICD-10-CM | POA: Diagnosis not present

## 2017-02-13 LAB — URINALYSIS, ROUTINE W REFLEX MICROSCOPIC
Bilirubin Urine: NEGATIVE
Ketones, ur: NEGATIVE
Leukocytes, UA: NEGATIVE
NITRITE: NEGATIVE
RBC / HPF: NONE SEEN (ref 0–?)
Specific Gravity, Urine: 1.025 (ref 1.000–1.030)
TOTAL PROTEIN, URINE-UPE24: NEGATIVE
Urine Glucose: NEGATIVE
Urobilinogen, UA: 0.2 (ref 0.0–1.0)
pH: 6 (ref 5.0–8.0)

## 2017-02-13 LAB — BASIC METABOLIC PANEL
BUN: 21 mg/dL (ref 6–23)
CALCIUM: 9 mg/dL (ref 8.4–10.5)
CHLORIDE: 105 meq/L (ref 96–112)
CO2: 28 meq/L (ref 19–32)
CREATININE: 0.76 mg/dL (ref 0.40–1.20)
GFR: 79.74 mL/min (ref >60.00)
Glucose, Bld: 100 mg/dL — ABNORMAL HIGH (ref 70–99)
Potassium: 4 mEq/L (ref 3.5–5.1)
SODIUM: 140 meq/L (ref 135–145)

## 2017-02-13 LAB — CBC WITH DIFFERENTIAL/PLATELET
Basophils Absolute: 0.1 10*3/uL (ref 0.0–0.1)
Basophils Relative: 0.8 % (ref 0.0–3.0)
Eosinophils Absolute: 0.1 10*3/uL (ref 0.0–0.7)
Eosinophils Relative: 1 % (ref 0.0–5.0)
HEMATOCRIT: 39.7 % (ref 36.0–46.0)
Hemoglobin: 13 g/dL (ref 12.0–15.0)
LYMPHS PCT: 41.5 % (ref 12.0–46.0)
Lymphs Abs: 3.1 10*3/uL (ref 0.7–4.0)
MCHC: 32.8 g/dL (ref 30.0–36.0)
MCV: 83.9 fl (ref 78.0–100.0)
MONOS PCT: 8.9 % (ref 3.0–12.0)
Monocytes Absolute: 0.7 10*3/uL (ref 0.1–1.0)
NEUTROS ABS: 3.6 10*3/uL (ref 1.4–7.7)
Neutrophils Relative %: 47.8 % (ref 43.0–77.0)
PLATELETS: 344 10*3/uL (ref 150.0–400.0)
RBC: 4.74 Mil/uL (ref 3.87–5.11)
RDW: 14 % (ref 11.5–15.5)
WBC: 7.4 10*3/uL (ref 4.0–10.5)

## 2017-02-13 LAB — HEPATIC FUNCTION PANEL
ALT: 14 U/L (ref 0–35)
AST: 13 U/L (ref 0–37)
Albumin: 4.1 g/dL (ref 3.5–5.2)
Alkaline Phosphatase: 80 U/L (ref 39–117)
BILIRUBIN DIRECT: 0.2 mg/dL (ref 0.0–0.3)
TOTAL PROTEIN: 6.6 g/dL (ref 6.0–8.3)
Total Bilirubin: 0.5 mg/dL (ref 0.2–1.2)

## 2017-02-13 LAB — CORTISOL: CORTISOL PLASMA: 9.8 ug/dL

## 2017-02-13 LAB — IBC PANEL
Iron: 58 ug/dL (ref 42–145)
Saturation Ratios: 16.1 % — ABNORMAL LOW (ref 20.0–50.0)
Transferrin: 258 mg/dL (ref 212.0–360.0)

## 2017-02-13 LAB — C-REACTIVE PROTEIN: CRP: 1.3 mg/dL (ref 0.5–20.0)

## 2017-02-13 LAB — VITAMIN D 25 HYDROXY (VIT D DEFICIENCY, FRACTURES): VITD: 24.87 ng/mL — AB (ref 30.00–100.00)

## 2017-02-13 LAB — VITAMIN B12: Vitamin B-12: 189 pg/mL — ABNORMAL LOW (ref 211–911)

## 2017-02-13 LAB — SEDIMENTATION RATE: SED RATE: 12 mm/h (ref 0–30)

## 2017-02-13 LAB — TSH: TSH: 2.64 u[IU]/mL (ref 0.35–4.50)

## 2017-02-13 MED ORDER — VITAMIN D (ERGOCALCIFEROL) 1.25 MG (50000 UNIT) PO CAPS: 50000.0000 [IU] | ORAL_CAPSULE | ORAL | 0 refills | Status: DC

## 2017-02-13 NOTE — Patient Instructions (Addendum)
Please continue all other medications as before, and refills have been done if requested.  Please have the pharmacy call with any other refills you may need.  Please continue your efforts at being more active, low cholesterol diet, and weight control.  You are otherwise up to date with prevention measures today.  Please keep your appointments with your specialists as you may have planned  Please go to the XRAY Department in the Basement (go straight as you get off the elevator) for the x-ray testing  Please go to the LAB in the Basement (turn left off the elevator) for the tests to be done today  You will be contacted by phone if any changes need to be made immediately.  Otherwise, you will receive a letter about your results with an explanation, but please check with MyChart first.  Please remember to sign up for MyChart if you have not done so, as this will be important to you in the future with finding out test results, communicating by private email, and scheduling acute appointments online when needed.  

## 2017-02-13 NOTE — Telephone Encounter (Signed)
-----   Message from Biagio Borg, MD sent at 02/13/2017 12:24 PM EDT ----- Left message on MyChart, pt to cont same tx except  he test results show that your current treatment is OK, except the Vitamin D level is low, and the B12 level is also low.  Please take Vit D 50,000 units weekly for 12 weeks, then take OTC Vitamin D at 2000 units per day (indefinitely).  Also please come to a Nurse Visit for a B12 shot to be done here at the office, then start taking OTC B12 tablets - 1 per day (indefinitely).  You should hear from the office about this as well.    Shakeia Krus to please inform pt, needs b12 1000 mg IM x 1 at a nurse visit, and I will do the rx for the Vit D

## 2017-02-13 NOTE — Progress Notes (Signed)
Subjective:    Patient ID: Debra Atkinson, female    DOB: 1945-10-17, 71 y.o.   MRN: [id number]  HPI  Here with lack of energy for 2 wks, did have recent UTI successfully treated , still has recurring bilat LBP without change in severity, bowel or bladder change, fever, wt loss,  worsening LE pain/numbness/weakness, gait change or falls. Usually better with ibuprofen.  Wondering if she needs labs done. Wt Readings from Last 3 Encounters:  02/13/17 197 lb (89.4 kg)  02/04/17 196 lb (88.9 kg)  10/31/16 196 lb (88.9 kg)   BP Readings from Last 3 Encounters:  02/13/17 (!) 144/80  02/04/17 140/88  10/31/16 138/80  Has chronic pain to back of head and some vertigo to lying down, seemed to start after fall and hit forehead.  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  Denies worsening reflux, abd pain, dysphagia, bowel change or blood. Has intermittent nausea without vomiting, sometimes worse if overeats but no vomiting.  Has known right sided thyroid nodule from dec 2017, Denies hyper or hypo thyroid symptoms such as voice, skin or hair change.  Has had mild worsening depressive symptoms, with sleeping more, lower mood, low energy, not looking forward to things but no suicidal ideation, or panic; has some ongoing anxiety as well.  Husband says she snores, and he has OSA,  Does have several wks ongoing nasal allergy symptoms with clearish congestion, itch and sneezing, without fever, pain, ST, cough, swelling or wheezing, just does not take her allergy meds well.  Declines pulm or allergy referrals.   Past Medical History:  Diagnosis Date  . Concussion '06, '11  . Diabetes mellitus    diet management  . Family history of colon cancer 02/28/2016  . Fibromyalgia 10/08/2013  . Genital warts    treated podophyllin  . Hyperlipidemia    diet managed  . Varicella    Past Surgical History:  Procedure Laterality Date  . APPENDECTOMY  1979  . [place]  . CHOLECYSTECTOMY  1979   laparotomy    reports that she has never smoked. She has never used smokeless tobacco. She reports that she does not drink alcohol or use drugs. family history includes COPD in her mother; Cancer (age of onset: 70) in her mother; Diabetes in her maternal grandfather and mother; Heart disease in her father and mother; Rheumatic fever in her father. Allergies  Allergen Reactions  . Prednisone Itching    Redness from injection   Current Outpatient Prescriptions on File Prior to Visit  Medication Sig Dispense Refill  . aspirin 81 MG EC tablet Take 1 tablet (81 mg total) by mouth daily. Swallow whole. 30 tablet 12  . cetirizine (ZYRTEC) 10 MG tablet Take 1 tablet (10 mg total) by mouth daily. 30 tablet 11  . rosuvastatin (CRESTOR) 20 MG tablet Take 1 tablet (20 mg total) by mouth daily. 90 tablet 3  . tiZANidine (ZANAFLEX) 4 MG tablet Take 1 tablet (4 mg total) by mouth every 6 (six) hours as needed for muscle spasms. 30 tablet 1  . triamcinolone (NASACORT AQ) 55 MCG/ACT AERO nasal inhaler Place 2 sprays into the nose daily. 1 Inhaler 12   No current facility-administered medications on file prior to visit.    Review of Systems  Constitutional: Negative for other unusual diaphoresis or sweats HENT: Negative for ear discharge or swelling Eyes: Negative for other worsening visual disturbances Respiratory: Negative for stridor or other swelling  Gastrointestinal: Negative for worsening distension  or other blood Genitourinary: Negative for retention or other urinary change Musculoskeletal: Negative for other MSK pain or swelling Skin: Negative for color change or other new lesions Neurological: Negative for worsening tremors and other numbness  Psychiatric/Behavioral: Negative for worsening agitation or other fatigue All other system neg per pt   Objective:   Physical Exam BP (!) 144/80   Pulse 68   Ht 5\' 3"  (1.6 m)   Wt 197 lb (89.4 kg)   SpO2 99%   BMI 34.90 kg/m  VS noted,  Constitutional: Pt  appears in NAD HENT: Head: NCAT.  Right Ear: External ear normal.  Left Ear: External ear normal.  Eyes: . Pupils are equal, round, and reactive to light. Conjunctivae and EOM are normal Nose: without d/c or deformity Neck: Neck supple. Gross normal ROM Cardiovascular: Normal rate and regular rhythm.   Pulmonary/Chest: Effort normal and breath sounds without rales or wheezing.  Abd:  Soft, NT, ND, + BS, no organomegaly Neurological: Pt is alert. At baseline orientation, motor grossly intact Skin: Skin is warm. No rashes, other new lesions, no LE edema Psychiatric: Pt behavior is normal without agitation  No other exam findings  Lab Results  Component Value Date   WBC 9.7 11/01/2016   HGB 13.3 11/01/2016   HCT 40.6 11/01/2016   PLT 296.0 11/01/2016   GLUCOSE 111 (H) 11/01/2016   CHOL 233 (H) 11/01/2016   TRIG 124.0 11/01/2016   HDL 55.10 11/01/2016   LDLDIRECT 163.8 05/18/2013   LDLCALC 153 (H) 11/01/2016   ALT 14 11/01/2016   AST 11 11/01/2016   NA 140 11/01/2016   K 4.1 11/01/2016   CL 103 11/01/2016   CREATININE 0.76 11/01/2016   BUN 15 11/01/2016   CO2 31 11/01/2016   TSH 2.70 11/01/2016   INR 0.92 05/24/2011   HGBA1C 6.0 04/26/2016   MICROALBUR 0.9 10/26/2015       Assessment & Plan:

## 2017-02-13 NOTE — Telephone Encounter (Signed)
Pt called checking to see if a Hemoglobin A1C could be checked when she is here for a B12 injection in the morning?

## 2017-02-13 NOTE — Telephone Encounter (Signed)
Thanks. Noted.

## 2017-02-13 NOTE — Telephone Encounter (Signed)
In-house order has been put in.

## 2017-02-13 NOTE — Telephone Encounter (Signed)
Pt called for lab results,  Pt was given lab results and expressed understanding,  Understands to take Vit D 1x per week and to get 2000 units OTC, and to pick up B12 OTC, appt made for B12 injection  On 8/16

## 2017-02-13 NOTE — Telephone Encounter (Signed)
Called pt, LVM.   

## 2017-02-14 ENCOUNTER — Other Ambulatory Visit (INDEPENDENT_AMBULATORY_CARE_PROVIDER_SITE_OTHER): Payer: Medicare HMO

## 2017-02-14 ENCOUNTER — Ambulatory Visit (INDEPENDENT_AMBULATORY_CARE_PROVIDER_SITE_OTHER): Payer: Medicare HMO | Admitting: General Practice

## 2017-02-14 ENCOUNTER — Telehealth: Payer: Self-pay

## 2017-02-14 DIAGNOSIS — R7303 Prediabetes: Secondary | ICD-10-CM | POA: Diagnosis not present

## 2017-02-14 DIAGNOSIS — E538 Deficiency of other specified B group vitamins: Secondary | ICD-10-CM | POA: Diagnosis not present

## 2017-02-14 LAB — HEMOGLOBIN A1C: Hgb A1c MFr Bld: 6.1 % (ref 4.6–6.5)

## 2017-02-14 LAB — URINE CULTURE: Organism ID, Bacteria: NO GROWTH

## 2017-02-14 MED ORDER — CYANOCOBALAMIN 1000 MCG/ML IJ SOLN
1000.0000 ug | Freq: Once | INTRAMUSCULAR | Status: AC
  Administered 2017-02-14: 1000 ug via INTRAMUSCULAR

## 2017-02-14 NOTE — Telephone Encounter (Signed)
Per renee/lab---ok to send add on sheet to lab for A1c lab----to be done with labwork patient did on 8/15---add on sheet faxed to lab

## 2017-02-16 NOTE — Assessment & Plan Note (Signed)
For cxr and labs as ordered, declines pulm referral to r/o osa,  to f/u any worsening symptoms or concerns

## 2017-02-16 NOTE — Assessment & Plan Note (Signed)
Lab Results  Component Value Date   HGBA1C 6.1 02/14/2017  stable overall by history and exam, recent data reviewed with pt, and pt to continue medical treatment as before,  to f/u any worsening symptoms or concerns 

## 2017-02-16 NOTE — Assessment & Plan Note (Signed)
Also for f/u ua and cx, r/o recurrence

## 2017-02-16 NOTE — Assessment & Plan Note (Signed)
Lab Results  Component Value Date   LDLCALC 153 (H) 11/01/2016  for lower chol diet, cont crestor asd,

## 2017-02-19 DIAGNOSIS — D120 Benign neoplasm of cecum: Secondary | ICD-10-CM | POA: Diagnosis not present

## 2017-02-19 DIAGNOSIS — Z1211 Encounter for screening for malignant neoplasm of colon: Secondary | ICD-10-CM | POA: Diagnosis not present

## 2017-02-19 DIAGNOSIS — Z800 Family history of malignant neoplasm of digestive organs: Secondary | ICD-10-CM | POA: Diagnosis not present

## 2017-02-19 DIAGNOSIS — K5730 Diverticulosis of large intestine without perforation or abscess without bleeding: Secondary | ICD-10-CM | POA: Diagnosis not present

## 2017-02-19 NOTE — Telephone Encounter (Signed)
Error

## 2017-02-25 DIAGNOSIS — D120 Benign neoplasm of cecum: Secondary | ICD-10-CM | POA: Diagnosis not present

## 2017-03-02 ENCOUNTER — Other Ambulatory Visit: Payer: Self-pay | Admitting: Family Medicine

## 2017-03-02 DIAGNOSIS — R42 Dizziness and giddiness: Secondary | ICD-10-CM

## 2017-03-09 ENCOUNTER — Ambulatory Visit (INDEPENDENT_AMBULATORY_CARE_PROVIDER_SITE_OTHER): Payer: Medicare HMO | Admitting: Family Medicine

## 2017-03-09 ENCOUNTER — Encounter: Payer: Self-pay | Admitting: Family Medicine

## 2017-03-09 VITALS — BP 128/72 | HR 81 | Temp 98.7°F | Resp 14 | Ht 63.0 in | Wt 195.0 lb

## 2017-03-09 DIAGNOSIS — R238 Other skin changes: Secondary | ICD-10-CM | POA: Diagnosis not present

## 2017-03-09 MED ORDER — VALACYCLOVIR HCL 1 G PO TABS: 1000.0000 mg | ORAL_TABLET | Freq: Three times a day (TID) | ORAL | 0 refills | Status: DC

## 2017-03-09 NOTE — Progress Notes (Signed)
Pre visit review using our clinic review tool, if applicable. No additional management support is needed unless otherwise documented below in the visit note. 
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
## 2017-03-28 ENCOUNTER — Ambulatory Visit (INDEPENDENT_AMBULATORY_CARE_PROVIDER_SITE_OTHER): Payer: Medicare HMO | Admitting: *Deleted

## 2017-03-28 VITALS — BP 126/80 | HR 80 | Resp 20 | Ht 62.0 in | Wt 194.0 lb

## 2017-03-28 DIAGNOSIS — Z0000 Encounter for general adult medical examination without abnormal findings: Secondary | ICD-10-CM

## 2017-03-28 DIAGNOSIS — Z23 Encounter for immunization: Secondary | ICD-10-CM

## 2017-03-28 NOTE — Patient Instructions (Addendum)
Continue doing brain stimulating activities (puzzles, reading, adult coloring books, staying active) to keep memory sharp.   Continue to eat heart healthy diet (full of fruits, vegetables, whole grains, lean protein, water--limit salt, fat, and sugar intake) and increase physical activity as tolerated.   Debra Atkinson , Thank you for taking time to come for your Medicare Wellness Visit. I appreciate your ongoing commitment to your health goals. Please review the following plan we discussed and let me know if I can assist you in the future.   These are the goals we discussed: Goals    . contunue to be active          Continue to do yard work, house work, go to [employer] center, enjoy life, family and worship God.    . patient          Will increase walking when able;  OR Get a pedometer!    . Weight (lb) < 180 lb (81.6 kg)          Will keep cutting back on soft drinks Will diary x 3 days / calorie [email or web address]  Calorie Counting for Weight Loss Calories are energy you get from the things you eat and drink. Your body uses this energy to keep you going throughout the day. The number of calories you eat affects your weight. When you eat more calories than your body needs, your body stores the extra calories as fat. When you eat fewer calories than your body needs, your body burns fat to get the energy it needs. Calorie counting means keeping track of how many calories you eat and drink each day. If you make sure to eat fewer calories than your body needs, you should lose weight. In order for calorie counting to work, you will need to eat the number of calories that are right for you in a day to lose a healthy amount of weight per week. A healthy amount of weight to lose per week is usually 1-2 lb (0.5-0.9 kg). A dietitian can determine how many calories you need in a day and give you suggestions on how to reach your calorie goal.  WHAT IS MY MY PLAN? My goal is to have __________ calories per  day.  If I have this many calories per day, I should lose around __________ pounds per week. WHAT DO I NEED TO KNOW ABOUT CALORIE COUNTING? In order to meet your daily calorie goal, you will need to:  Find out how many calories are in each food you would like to eat. Try to do this before you eat.  Decide how much of the food you can eat.  Write down what you ate and how many calories it had. Doing this is called keeping a food log. WHERE DO I FIND CALORIE INFORMATION? The number of calories in a food can be found on a Nutrition Facts label. Note that all the information on a label is based on a specific serving of the food. If a food does not have a Nutrition Facts label, try to look up the calories online or ask your dietitian for help. HOW DO I DECIDE HOW MUCH TO EAT? To decide how much of the food you can eat, you will need to consider both the number of calories in one serving and the size of one serving. This information can be found on the Nutrition Facts label. If a food does not have a Nutrition Facts label, look up the information online or ask  your dietitian for help. Remember that calories are listed per serving. If you choose to have more than one serving of a food, you will have to multiply the calories per serving by the amount of servings you plan to eat. For example, the label on a package of bread might say that a serving size is 1 slice and that there are 90 calories in a serving. If you eat 1 slice, you will have eaten 90 calories. If you eat 2 slices, you will have eaten 180 calories. HOW DO I KEEP A FOOD LOG? After each meal, record the following information in your food log:  What you ate.  How much of it you ate.  How many calories it had.  Then, add up your calories. Keep your food log near you, such as in a small notebook in your pocket. Another option is to use a mobile app or website. Some programs will calculate calories for you and show you how many calories you  have left each time you add an item to the log. WHAT ARE SOME CALORIE COUNTING TIPS?  Use your calories on foods and drinks that will fill you up and not leave you hungry. Some examples of this include foods like nuts and nut butters, vegetables, lean proteins, and high-fiber foods (more than 5 g fiber per serving).  Eat nutritious foods and avoid empty calories. Empty calories are calories you get from foods or beverages that do not have many nutrients, such as candy and soda. It is better to have a nutritious high-calorie food (such as an avocado) than a food with few nutrients (such as a bag of chips).  Know how many calories are in the foods you eat most often. This way, you do not have to look up how many calories they have each time you eat them.  Look out for foods that may seem like low-calorie foods but are really high-calorie foods, such as baked goods, soda, and fat-free candy.  Pay attention to calories in drinks. Drinks such as sodas, specialty coffee drinks, alcohol, and juices have a lot of calories yet do not fill you up. Choose low-calorie drinks like water and diet drinks.  Focus your calorie counting efforts on higher calorie items. Logging the calories in a garden salad that contains only vegetables is less important than calculating the calories in a milk shake.  Find a way of tracking calories that works for you. Get creative. Most people who are successful find ways to keep track of how much they eat in a day, even if they do not count every calorie. WHAT ARE SOME PORTION CONTROL TIPS?  Know how many calories are in a serving. This will help you know how many servings of a certain food you can have.  Use a measuring cup to measure serving sizes. This is helpful when you start out. With time, you will be able to estimate serving sizes for some foods.  Take some time to put servings of different foods on your favorite plates, bowls, and cups so you know what a serving looks  like.  Try not to eat straight from a bag or box. Doing this can lead to overeating. Put the amount you would like to eat in a cup or on a plate to make sure you are eating the right portion.  Use smaller plates, glasses, and bowls to prevent overeating. This is a quick and easy way to practice portion control. If your plate is smaller, less  food can fit on it.  Try not to multitask while eating, such as watching TV or using your computer. If it is time to eat, sit down at a table and enjoy your food. Doing this will help you to start recognizing when you are full. It will also make you more aware of what and how much you are eating. HOW CAN I CALORIE COUNT WHEN EATING OUT?  Ask for smaller portion sizes or child-sized portions.  Consider sharing an entree and sides instead of getting your own entree.  If you get your own entree, eat only half. Ask for a box at the beginning of your meal and put the rest of your entree in it so you are not tempted to eat it.  Look for the calories on the menu. If calories are listed, choose the lower calorie options.  Choose dishes that include vegetables, fruits, whole grains, low-fat dairy products, and lean protein. Focusing on smart food choices from each of the 5 food groups can help you stay on track at restaurants.  Choose items that are boiled, broiled, grilled, or steamed.  Choose water, milk, unsweetened iced tea, or other drinks without added sugars. If you want an alcoholic beverage, choose a lower calorie option. For example, a regular margarita can have up to 700 calories and a glass of wine has around 150.  Stay away from items that are buttered, battered, fried, or served with cream sauce. Items labeled "crispy" are usually fried, unless stated otherwise.  Ask for dressings, sauces, and syrups on the side. These are usually very high in calories, so do not eat much of them.  Watch out for salads. Many people think salads are a healthy  option, but this is often not the case. Many salads come with bacon, fried chicken, lots of cheese, fried chips, and dressing. All of these items have a lot of calories. If you want a salad, choose a garden salad and ask for grilled meats or steak. Ask for the dressing on the side, or ask for olive oil and vinegar or lemon to use as dressing.  Estimate how many servings of a food you are given. For example, a serving of cooked rice is  cup or about the size of half a tennis ball or one cupcake wrapper. Knowing serving sizes will help you be aware of how much food you are eating at restaurants. The list below tells you how big or small some common portion sizes are based on everyday objects.  1 oz--4 stacked dice.  3 oz--1 deck of cards.  1 tsp--1 dice.  1 Tbsp-- a Ping-Pong ball.  2 Tbsp--1 Ping-Pong ball.   cup--1 tennis ball or 1 cupcake wrapper.  1 cup--1 baseball.   This information is not intended to replace advice given to you by your health care provider. Make sure you discuss any questions you have with your health care provider.   Document Released: 06/18/2005 Document Revised: 07/09/2014 Document Reviewed: 04/23/2013 Elsevier Interactive Patient Education [employer].         This is a list of the screening recommended for you and due dates:  Health Maintenance  Topic Date Due  . Urine Protein Check  10/25/2016  . Flu Shot  01/30/2017  . Hemoglobin A1C  08/17/2017  . Eye exam for diabetics  08/29/2017  . Complete foot exam   10/31/2017  . Mammogram  04/23/2018  . Colon Cancer Screening  02/19/2022  . Tetanus Vaccine  04/28/2022  .  DEXA scan (bone density measurement)  Completed  .  Hepatitis C: One time screening is recommended by Center for Disease Control  (CDC) for  adults born from [birth year] through 1965.   Completed  . Pneumonia vaccines  Completed     Protein Content in Foods Generally, most healthy people need around 50 grams of protein each day.  Depending on your overall health, you may need more or less protein in your diet. Talk to your health care provider or dietitian about how much protein you need. See the following list for the protein content of some common foods. High-protein foods High-protein foods contain 4 grams (4 g) or more of protein per serving. They include:  Beef, ground sirloin (cooked) - 3 oz have 24 g of protein.  Cheese (hard) - 1 oz has 7 g of protein.  Chicken breast, boneless and skinless (cooked) - 3 oz have 13.4 g of protein.  Cottage cheese - 1/2 cup has 13.4 g of protein.  Egg - 1 egg has 6 g of protein.  Fish, filet (cooked) - 1 oz has 6-7 g of protein.  Garbanzo beans (canned or cooked) - 1/2 cup has 6-7 g of protein.  Kidney beans (canned or cooked) - 1/2 cup has 6-7 g of protein.  Lamb (cooked) - 3 oz has 24 g of protein.  Milk - 1 cup (8 oz) has 8 g of protein.  Nuts (peanuts, pistachios, almonds) - 1 oz has 6 g of protein.  Peanut butter - 1 oz has 7-8 g of protein.  Pork tenderloin (cooked) - 3 oz has 18.4 g of protein.  Pumpkin seeds - 1 oz has 8.5 g of protein.  Soybeans (roasted) - 1 oz has 8 g of protein.  Soybeans (cooked) - 1/2 cup has 11 g of protein.  Soy milk - 1 cup (8 oz) has 5-10 g of protein.  Soy or vegetable patty - 1 patty has 11 g of protein.  Sunflower seeds - 1 oz has 5.5 g of protein.  Tofu (firm) - 1/2 cup has 20 g of protein.  Tuna (canned in water) - 3 oz has 20 g of protein.  Yogurt - 6 oz has 8 g of protein.  Low-protein foods Low-protein foods contain 3 grams (3 g) or less of protein per serving. They include:  Beets (raw or cooked) - 1/2 cup has 1.5 g of protein.  Bran cereal - 1/2 cup has 2-3 g of protein.  Bread - 1 slice has 2.5 g of protein.  Broccoli (raw or cooked) - 1/2 cup has 2 g of protein.  Collard greens (raw or cooked) - 1/2 cup has 2 g of protein.  Corn (fresh or cooked) - 1/2 cup has 2 g of protein.  Cream cheese - 1  oz has 2 g of protein.  Creamer (half-and-half) - 1 oz has 1 g of protein.  Flour tortilla - 1 tortilla has 2.5 g of protein  Frozen yogurt - 1/2 cup has 3 g of protein.  Fruit or vegetable juice - 1/2 cup has 1 g of protein.  Green beans (raw or cooked) - 1/2 cup has 1 g of protein.  Green peas (canned) - 1/2 cup has 3.5 g of protein.  Muffins - 1 small muffin (2 oz) has 3 g of protein.  Oatmeal (cooked) - 1/2 cup has 3 g of protein.  Potato (baked with skin) - 1 medium potato has 3 g of protein.  Rice (cooked) -  1/2 cup has 2.5-3.5 g of protein.  Sour cream - 1/2 cup has 2.5 g of protein.  Spinach (cooked) - 1/2 cup has 3 g of protein.  Squash (cooked) - 1/2 cup has 1.5 g of protein.  Actual amounts of protein may be different depending on processing. Talk with your health care provider or dietitian about what foods are recommended for you. This information is not intended to replace advice given to you by your health care provider. Make sure you discuss any questions you have with your health care provider. Document Released: 09/17/2015 Document Revised: 02/27/2016 Document Reviewed: 02/27/2016 Elsevier Interactive Patient Education  2018 [employer].  Fat and Cholesterol Restricted Diet Getting too much fat and cholesterol in your diet may cause health problems. Following this diet helps keep your fat and cholesterol at normal levels. This can keep you from getting sick. What types of fat should I choose?  Choose monosaturated and polyunsaturated fats. These are found in foods such as olive oil, canola oil, flaxseeds, walnuts, almonds, and seeds.  Eat more omega-3 fats. Good choices include salmon, mackerel, sardines, tuna, flaxseed oil, and ground flaxseeds.  Limit saturated fats. These are in animal products such as meats, butter, and cream. They can also be in plant products such as palm oil, palm kernel oil, and coconut oil.  Avoid foods with partially  hydrogenated oils in them. These contain trans fats. Examples of foods that have trans fats are stick margarine, some tub margarines, cookies, crackers, and other baked goods. What general guidelines do I need to follow?  Check food labels. Look for the words "trans fat" and "saturated fat."  When preparing a meal: ? Fill half of your plate with vegetables and green salads. ? Fill one fourth of your plate with whole grains. Look for the word "whole" as the first word in the ingredient list. ? Fill one fourth of your plate with lean protein foods.  Eat more foods that have fiber, like apples, carrots, beans, peas, and barley.  Eat more home-cooked foods. Eat less at restaurants and buffets.  Limit or avoid alcohol.  Limit foods high in starch and sugar.  Limit fried foods.  Cook foods without frying them. Baking, boiling, grilling, and broiling are all great options.  Lose weight if you are overweight. Losing even a small amount of weight can help your overall health. It can also help prevent diseases such as diabetes and heart disease. What foods can I eat? Grains Whole grains, such as whole wheat or whole grain breads, crackers, cereals, and pasta. Unsweetened oatmeal, bulgur, barley, quinoa, or brown rice. Corn or whole wheat flour tortillas. Vegetables Fresh or frozen vegetables (raw, steamed, roasted, or grilled). Green salads. Fruits All fresh, canned (in natural juice), or frozen fruits. Meat and Other Protein Products Ground beef (85% or leaner), grass-fed beef, or beef trimmed of fat. Skinless chicken or [place]. Ground chicken or [place]. Pork trimmed of fat. All fish and seafood. Eggs. Dried beans, peas, or lentils. Unsalted nuts or seeds. Unsalted canned or dry beans. Dairy Low-fat dairy products, such as skim or 1% milk, 2% or reduced-fat cheeses, low-fat ricotta or cottage cheese, or plain low-fat yogurt. Fats and Oils Tub margarines without trans fats. Light or  reduced-fat mayonnaise and salad dressings. Avocado. Olive, canola, sesame, or safflower oils. Natural peanut or almond butter (choose ones without added sugar and oil). The items listed above may not be a complete list of recommended foods or beverages. Contact your dietitian  for more options. What foods are not recommended? Grains White bread. White pasta. White rice. Cornbread. Bagels, pastries, and croissants. Crackers that contain trans fat. Vegetables White potatoes. Corn. Creamed or fried vegetables. Vegetables in a cheese sauce. Fruits Dried fruits. Canned fruit in light or heavy syrup. Fruit juice. Meat and Other Protein Products Fatty cuts of meat. Ribs, chicken wings, bacon, sausage, bologna, salami, chitterlings, fatback, hot dogs, bratwurst, and packaged luncheon meats. Liver and organ meats. Dairy Whole or 2% milk, cream, half-and-half, and cream cheese. Whole milk cheeses. Whole-fat or sweetened yogurt. Full-fat cheeses. Nondairy creamers and whipped toppings. Processed cheese, cheese spreads, or cheese curds. Sweets and Desserts Corn syrup, sugars, honey, and molasses. Candy. Jam and jelly. Syrup. Sweetened cereals. Cookies, pies, cakes, donuts, muffins, and ice cream. Fats and Oils Butter, stick margarine, lard, shortening, ghee, or bacon fat. Coconut, palm kernel, or palm oils. Beverages Alcohol. Sweetened drinks (such as sodas, lemonade, and fruit drinks or punches). The items listed above may not be a complete list of foods and beverages to avoid. Contact your dietitian for more information. This information is not intended to replace advice given to you by your health care provider. Make sure you discuss any questions you have with your health care provider. Document Released: 12/18/2011 Document Revised: 02/23/2016 Document Reviewed: 09/17/2013 Elsevier Interactive Patient Education  2018 [place] DASH stands for "Dietary Approaches to Stop  Hypertension." The DASH eating plan is a healthy eating plan that has been shown to reduce high blood pressure (hypertension). It may also reduce your risk for type 2 diabetes, heart disease, and stroke. The DASH eating plan may also help with weight loss. What are tips for following this plan? General guidelines  Avoid eating more than 2,300 mg (milligrams) of salt (sodium) a day. If you have hypertension, you may need to reduce your sodium intake to 1,500 mg a day.  Limit alcohol intake to no more than 1 drink a day for nonpregnant women and 2 drinks a day for men. One drink equals 12 oz of beer, 5 oz of wine, or 1 oz of hard liquor.  Work with your health care provider to maintain a healthy body weight or to lose weight. Ask what an ideal weight is for you.  Get at least 30 minutes of exercise that causes your heart to beat faster (aerobic exercise) most days of the week. Activities may include walking, swimming, or biking.  Work with your health care provider or diet and nutrition specialist (dietitian) to adjust your eating plan to your individual calorie needs. Reading food labels  Check food labels for the amount of sodium per serving. Choose foods with less than 5 percent of the Daily Value of sodium. Generally, foods with less than 300 mg of sodium per serving fit into this eating plan.  To find whole grains, look for the word "whole" as the first word in the ingredient list. Shopping  Buy products labeled as "low-sodium" or "no salt added."  Buy fresh foods. Avoid canned foods and premade or frozen meals. Cooking  Avoid adding salt when cooking. Use salt-free seasonings or herbs instead of table salt or sea salt. Check with your health care provider or pharmacist before using salt substitutes.  Do not fry foods. Cook foods using healthy methods such as baking, boiling, grilling, and broiling instead.  Cook with heart-healthy oils, such as olive, canola, soybean, or sunflower  oil. Meal planning   Eat a balanced diet that  includes: ? 5 or more servings of fruits and vegetables each day. At each meal, try to fill half of your plate with fruits and vegetables. ? Up to 6-8 servings of whole grains each day. ? Less than 6 oz of lean meat, poultry, or fish each day. A 3-oz serving of meat is about the same size as a deck of cards. One egg equals 1 oz. ? 2 servings of low-fat dairy each day. ? A serving of nuts, seeds, or beans 5 times each week. ? Heart-healthy fats. Healthy fats called Omega-3 fatty acids are found in foods such as flaxseeds and coldwater fish, like sardines, salmon, and mackerel.  Limit how much you eat of the following: ? Canned or prepackaged foods. ? Food that is high in trans fat, such as fried foods. ? Food that is high in saturated fat, such as fatty meat. ? Sweets, desserts, sugary drinks, and other foods with added sugar. ? Full-fat dairy products.  Do not salt foods before eating.  Try to eat at least 2 vegetarian meals each week.  Eat more home-cooked food and less restaurant, buffet, and fast food.  When eating at a restaurant, ask that your food be prepared with less salt or no salt, if possible. What foods are recommended? The items listed may not be a complete list. Talk with your dietitian about what dietary choices are best for you. Grains Whole-grain or whole-wheat bread. Whole-grain or whole-wheat pasta. Brown rice. [place]. Bulgur. Whole-grain and low-sodium cereals. Pita bread. Low-fat, low-sodium crackers. Whole-wheat flour tortillas. Vegetables Fresh or frozen vegetables (raw, steamed, roasted, or grilled). Low-sodium or reduced-sodium tomato and vegetable juice. Low-sodium or reduced-sodium tomato sauce and tomato paste. Low-sodium or reduced-sodium canned vegetables. Fruits All fresh, dried, or frozen fruit. Canned fruit in natural juice (without added sugar). Meat and other protein foods Skinless chicken or  [place]. Ground chicken or [place]. Pork with fat trimmed off. Fish and seafood. Egg whites. Dried beans, peas, or lentils. Unsalted nuts, nut butters, and seeds. Unsalted canned beans. Lean cuts of beef with fat trimmed off. Low-sodium, lean deli meat. Dairy Low-fat (1%) or fat-free (skim) milk. Fat-free, low-fat, or reduced-fat cheeses. Nonfat, low-sodium ricotta or cottage cheese. Low-fat or nonfat yogurt. Low-fat, low-sodium cheese. Fats and oils Soft margarine without trans fats. Vegetable oil. Low-fat, reduced-fat, or light mayonnaise and salad dressings (reduced-sodium). Canola, safflower, olive, soybean, and sunflower oils. Avocado. Seasoning and other foods Herbs. Spices. Seasoning mixes without salt. Unsalted popcorn and pretzels. Fat-free sweets. What foods are not recommended? The items listed may not be a complete list. Talk with your dietitian about what dietary choices are best for you. Grains Baked goods made with fat, such as croissants, muffins, or some breads. Dry pasta or rice meal packs. Vegetables Creamed or fried vegetables. Vegetables in a cheese sauce. Regular canned vegetables (not low-sodium or reduced-sodium). Regular canned tomato sauce and paste (not low-sodium or reduced-sodium). Regular tomato and vegetable juice (not low-sodium or reduced-sodium). Angie Fava. Olives. Fruits Canned fruit in a light or heavy syrup. Fried fruit. Fruit in cream or butter sauce. Meat and other protein foods Fatty cuts of meat. Ribs. Fried meat. Berniece Salines. Sausage. Bologna and other processed lunch meats. Salami. Fatback. Hotdogs. Bratwurst. Salted nuts and seeds. Canned beans with added salt. Canned or smoked fish. Whole eggs or egg yolks. Chicken or [place] with skin. Dairy Whole or 2% milk, cream, and half-and-half. Whole or full-fat cream cheese. Whole-fat or sweetened yogurt. Full-fat cheese. Nondairy creamers. Whipped toppings.  Processed cheese and cheese spreads. Fats and oils Butter.  Stick margarine. Lard. Shortening. Ghee. Bacon fat. Tropical oils, such as coconut, palm kernel, or palm oil. Seasoning and other foods Salted popcorn and pretzels. Onion salt, garlic salt, seasoned salt, table salt, and sea salt. Worcestershire sauce. Tartar sauce. Barbecue sauce. Teriyaki sauce. Soy sauce, including reduced-sodium. Steak sauce. Canned and packaged gravies. Fish sauce. Oyster sauce. Cocktail sauce. Horseradish that you find on the shelf. Ketchup. Mustard. Meat flavorings and tenderizers. Bouillon cubes. Hot sauce and Tabasco sauce. Premade or packaged marinades. Premade or packaged taco seasonings. Relishes. Regular salad dressings. Where to find more information:  National Heart, Lung, and [place]: https://wilson-eaton.com/  American Heart Association: www.heart.org Summary  The DASH eating plan is a healthy eating plan that has been shown to reduce high blood pressure (hypertension). It may also reduce your risk for type 2 diabetes, heart disease, and stroke.  With the DASH eating plan, you should limit salt (sodium) intake to 2,300 mg a day. If you have hypertension, you may need to reduce your sodium intake to 1,500 mg a day.  When on the DASH eating plan, aim to eat more fresh fruits and vegetables, whole grains, lean proteins, low-fat dairy, and heart-healthy fats.  Work with your health care provider or diet and nutrition specialist (dietitian) to adjust your eating plan to your individual calorie needs. This information is not intended to replace advice given to you by your health care provider. Make sure you discuss any questions you have with your health care provider. Document Released: 06/07/2011 Document Revised: 06/11/2016 Document Reviewed: 06/11/2016 Elsevier Interactive Patient Education  2017 [place] that are packaged or in containers have a Nutrition Facts panel on the side or back. This is commonly called the food label. The  food label helps you make healthy food choices by providing information about serving size and the amount of calories and various nutrients in the food. You can check the food label to find out if the food contains high or low amounts of nutrients that you want to limit in your diet. You can also use the food label to see if the food is a good source of the nutrients that you want to make sure are included in your diet. How do I read the food label?  Begin by checking the serving size and number of servings in the container. All of the nutrition information listed on the food label is based on one serving. If you eat more than one serving, you must multiply the amounts (such as calories, grams of saturated fat, or milligrams of sodium) by the number of servings.  Check the calories. Choosing foods that are low in calories can help you manage your weight.  Look at the numbers in the % Daily Value column for each listed nutrient. This gives you an idea of how much of the daily recommended amount for that nutrient is provided in one serving of the food. A daily value of 5% or less is considered low. A daily value of 20% or more is considered high.  Check the amounts for the items you should limit in your diet. These include: ? Total fat. ? Saturated fat. ? Trans fat. ? Cholesterol. ? Sodium.  Check the amounts for the items you should make sure you get enough of. These include: ? Dietary fiber. ? Vitamins A and C. ? Calcium. ? Iron. What information is provided on the food label? Serving  information  Serving size. ? The serving size is listed in cups or pieces. The nutrient amounts listed on the food label apply to this amount of the food.  Servings per container or package. ? This shows the number of servings you can expect to get from the container or package if you follow the suggested serving size. Amount per serving  Calories. ? The number of calories in one serving of the food.  This information is helpful in managing weight. Low-calorie foods contain 40 calories or less. High-calorie foods contain 400 or more calories.  Calories from fat. ? The number of calories that come from fat in one serving. Percent daily value Percent daily value (shown on the label as % Daily Value) tells you what percent of the daily value for each nutrient one serving provides. The daily value is the recommended amount of the nutrient that you should get each day. For example, if 15% is listed next to dietary fiber, it means that one serving of the food will give you 15% of the recommended amount of fiber that you should get in a day. The daily values are based on a 2,000-calorie-per-day diet. You may get more or less than 2,000 calories in your diet each day, but the % Daily Value gives you an idea of whether the food contains a high or low amount of the listed nutrient. A daily value of 5% or less is low. A daily value of 20% or more is high. Total fat Total fat shows you the total amount of fat in one serving (listed in grams). Foods with high amounts of fat usually have higher calories and may lead to weight gain. Two of the fats that make up a portion of the total fat are included on the label:  Saturated fat. ? This number is the amount of saturated fat in one serving (listed in grams). Saturated fat increases the amount of blood cholesterol and should be limited to less than 7% of total calories each day. This means that if you eat 2,000 calories each day, you should eat less than 140 calories from saturated fat.    Cholesterol The amount of cholesterol in one serving is listed in milligrams. Cholesterol should be limited to no more than 300 mg each day. Sodium The amount of sodium in one serving is listed in milligrams. Most people should limit their sodium intake to 2,300 mg a day. Total carbohydrate This number shows the amount of total carbohydrate in one serving (listed in grams).  This information can help people with diabetes manage the amount of carbohydrate they eat. Two of the carbohydrates that make up a portion of the total carbohydrate are included on the label:  Dietary fiber. ? The amount of dietary fiber in one serving is listed in grams. Most people should eat at least 25 g of dietary fiber each day.  Sugars. ? The amount of sugar in one serving is also listed in grams. This value includes both naturally occurring sugars from fruit and milk and added sugars such as honey or table sugar.  Protein The amount of protein in one serving is listed in grams. What other important labeling is on the food package? Ingredients Food labels will list each ingredient in the food. The first ingredient listed is the ingredient that the food has the most of. The ingredients are listed in the order of their amount by weight from highest to lowest. Food allergen labeling Food labels may also include  a food allergen warning. Listed here are ingredients that can cause allergic reactions in some people. The potential allergens are listed behind the word "Contains" or "May contain." Examples of ingredients that may be listed are wheat, dairy, eggs, soy, and nuts. If a person knows that he or she is allergic to one of these ingredients, he or she will know to avoid that food. Where to find more information:  [place] Food and Drug Administration: [place] This information is not intended to replace advice given to you by your health care provider. Make sure you discuss any questions you have with your health care provider. Document Released: 06/18/2005 Document Revised: 02/15/2016 Document Reviewed: 05/11/2013 Elsevier Interactive Patient Education  2017 [place] for Gastroesophageal Reflux Disease, Adult When you have gastroesophageal reflux disease (GERD), the foods you eat and your eating habits are very important. Choosing the right foods can help ease your  discomfort. What guidelines do I need to follow?  Choose fruits, vegetables, whole grains, and low-fat dairy products.  Choose low-fat meat, fish, and poultry.  Limit fats such as oils, salad dressings, butter, nuts, and avocado.  Keep a food diary. This helps you identify foods that cause symptoms.  Avoid foods that cause symptoms. These may be different for everyone.  Eat small meals often instead of 3 large meals a day.  Eat your meals slowly, in a place where you are relaxed.  Limit fried foods.  Cook foods using methods other than frying.  Avoid drinking alcohol.  Avoid drinking large amounts of liquids with your meals.  Avoid bending over or lying down until 2-3 hours after eating. What foods are not recommended? These are some foods and drinks that may make your symptoms worse: Vegetables Tomatoes. Tomato juice. Tomato and spaghetti sauce. Chili peppers. Onion and garlic. Horseradish. Fruits Oranges, grapefruit, and lemon (fruit and juice). Meats High-fat meats, fish, and poultry. This includes hot dogs, ribs, ham, sausage, salami, and bacon. Dairy Whole milk and chocolate milk. Sour cream. Cream. Butter. Ice cream. Cream cheese. Drinks Coffee and tea. Bubbly (carbonated) drinks or energy drinks. Condiments Hot sauce. Barbecue sauce. Sweets/Desserts Chocolate and cocoa. Donuts. Peppermint and spearmint. Fats and Oils High-fat foods. This includes [place] fries and potato chips. Other Vinegar. Strong spices. This includes black pepper, white pepper, red pepper, cayenne, curry powder, cloves, ginger, and chili powder. The items listed above may not be a complete list of foods and drinks to avoid. Contact your dietitian for more information. This information is not intended to replace advice given to you by your health care provider. Make sure you discuss any questions you have with your health care provider. Document Released: 12/18/2011 Document Revised:  11/24/2015 Document Reviewed: 04/22/2013 Elsevier Interactive Patient Education  2017 [employer].

## 2017-03-28 NOTE — Progress Notes (Signed)
Pre visit review using our clinic review tool, if applicable. No additional management support is needed unless otherwise documented below in the visit note. 

## 2017-03-28 NOTE — Progress Notes (Addendum)
Subjective:   Debra Atkinson is a 71 y.o. female who presents for Medicare Annual (Subsequent) preventive examination.  Review of Systems:  No ROS.  Medicare Wellness Visit. Additional risk factors are reflected in the social history.  Cardiac Risk Factors include: advanced age (>68men, >48 women);dyslipidemia;obesity (BMI >30kg/m2) Sleep patterns: feels rested on waking, gets up 1 times nightly to void and sleeps 8 hours nightly.    Home Safety/Smoke Alarms: Feels safe in home. Smoke alarms in place.  Living environment; residence and Firearm Safety: 1-story house/ trailer, no firearms.Lives with husband, no needs for DME, good support system Seat Belt Safety/Bike Helmet: Wears seat belt.     Objective:     Vitals: BP 126/80   Pulse 80   Resp 20   Ht 5\' 2"  (1.575 m)   Wt 194 lb (88 kg)   SpO2 99%   BMI 35.48 kg/m   Body mass index is 35.48 kg/m.   Tobacco History  Smoking Status  . Never Smoker  Smokeless Tobacco  . Never Used     Counseling given: Not Answered   Past Medical History:  Diagnosis Date  . Concussion '06, '11  . Diabetes mellitus    diet management  . Family history of colon cancer 02/28/2016  . Fibromyalgia 10/08/2013  . Genital warts    treated podophyllin  . Hyperlipidemia    diet managed  . Varicella    Past Surgical History:  Procedure Laterality Date  . APPENDECTOMY  1979  . Pablo Pena  . CHOLECYSTECTOMY  1979   laparotomy   Family History  Problem Relation Age of Onset  . Diabetes Mother   . Heart disease Mother        CHF  . Cancer Mother 2       colon cancer  . COPD Mother   . Heart disease Father        CAD/MI  . Rheumatic fever Father   . Diabetes Maternal Grandfather    History  Sexual Activity  . Sexual activity: Not Currently  . Partners: Male    Outpatient Encounter Prescriptions as of 03/28/2017  Medication Sig  . aspirin 81 MG EC tablet Take 1 tablet (81 mg total) by mouth daily. Swallow  whole.  . cetirizine (ZYRTEC) 10 MG tablet Take 1 tablet (10 mg total) by mouth daily.  . Cyanocobalamin ([facility] VITAMIN B-12 PO) Take 5,000 tablets by mouth daily.  [name] ibuprofen (ADVIL,MOTRIN) 200 MG tablet Take 200 mg by mouth every 6 (six) hours as needed.  . meclizine (ANTIVERT) 25 MG tablet TAKE ONE TABLET BY MOUTH THREE TIMES DAILY AS NEEDED FOR DIZZINESS  . rosuvastatin (CRESTOR) 20 MG tablet Take 1 tablet (20 mg total) by mouth daily.  [name] tiZANidine (ZANAFLEX) 4 MG tablet Take 1 tablet (4 mg total) by mouth every 6 (six) hours as needed for muscle spasms.  [name] triamcinolone (NASACORT AQ) 55 MCG/ACT AERO nasal inhaler Place 2 sprays into the nose daily.  . Vitamin D, Ergocalciferol, (DRISDOL) 50000 units CAPS capsule Take 1 capsule (50,000 Units total) by mouth every 7 (seven) days.  . [DISCONTINUED] valACYclovir (VALTREX) 1000 MG tablet Take 1 tablet (1,000 mg total) by mouth 3 (three) times daily. (Patient not taking: Reported on 03/28/2017)   No facility-administered encounter medications on file as of 03/28/2017.     Activities of Daily Living In your present state of health, do you have any difficulty performing the following activities: 03/28/2017  Hearing? N  Vision? N  Difficulty concentrating or making decisions? N  Walking or climbing stairs? N  Dressing or bathing? N  Doing errands, shopping? N  Preparing Food and eating ? N  Using the Toilet? N  In the past six months, have you accidently leaked urine? N  Do you have problems with loss of bowel control? N  Managing your Medications? N  Managing your Finances? N  Housekeeping or managing your Housekeeping? N  Some recent data might be hidden    Patient Care Team: Biagio Borg, MD as PCP - General (Internal Medicine)    Assessment:    Physical assessment deferred to PCP.  Exercise Activities and Dietary recommendations Current Exercise Habits: The patient does not participate in regular exercise at present (maintains  several yards), Exercise limited by: Other - see comments (fibromyalgia)  Diet (meal preparation, eat out, water intake, caffeinated beverages, dairy products, fruits and vegetables): in general, a "healthy" diet    Reviewed heart healthy diet, encouraged patient to increase daily water intake. Weight loss tips provided, Diet education was attached to patient's AVS.    Goals    . contunue to be active          Continue to do yard work, house work, go to [employer] center, enjoy life, family and worship God.    . patient          Will increase walking when able;  OR Get a pedometer!    . Weight (lb) < 180 lb (81.6 kg)          Will keep cutting back on soft drinks Will diary x 3 days / calorie king.org  Calorie Counting for Weight Loss Calories are energy you get from the things you eat and drink. Your body uses this energy to keep you going throughout the day. The number of calories you eat affects your weight. When you eat more calories than your body needs, your body stores the extra calories as fat. When you eat fewer calories than your body needs, your body burns fat to get the energy it needs. Calorie counting means keeping track of how many calories you eat and drink each day. If you make sure to eat fewer calories than your body needs, you should lose weight. In order for calorie counting to work, you will need to eat the number of calories that are right for you in a day to lose a healthy amount of weight per week. A healthy amount of weight to lose per week is usually 1-2 lb (0.5-0.9 kg). A dietitian can determine how many calories you need in a day and give you suggestions on how to reach your calorie goal.  WHAT IS MY MY PLAN? My goal is to have __________ calories per day.  If I have this many calories per day, I should lose around __________ pounds per week. WHAT DO I NEED TO KNOW ABOUT CALORIE COUNTING? In order to meet your daily calorie goal, you will need to:  Find  out how many calories are in each food you would like to eat. Try to do this before you eat.  Decide how much of the food you can eat.  Write down what you ate and how many calories it had. Doing this is called keeping a food log. WHERE DO I FIND CALORIE INFORMATION? The number of calories in a food can be found on a Nutrition Facts label. Note that all the information on a label is based  on a specific serving of the food. If a food does not have a Nutrition Facts label, try to look up the calories online or ask your dietitian for help. HOW DO I DECIDE HOW MUCH TO EAT? To decide how much of the food you can eat, you will need to consider both the number of calories in one serving and the size of one serving. This information can be found on the Nutrition Facts label. If a food does not have a Nutrition Facts label, look up the information online or ask your dietitian for help. Remember that calories are listed per serving. If you choose to have more than one serving of a food, you will have to multiply the calories per serving by the amount of servings you plan to eat. For example, the label on a package of bread might say that a serving size is 1 slice and that there are 90 calories in a serving. If you eat 1 slice, you will have eaten 90 calories. If you eat 2 slices, you will have eaten 180 calories. HOW DO I KEEP A FOOD LOG? After each meal, record the following information in your food log:  What you ate.  How much of it you ate.  How many calories it had.  Then, add up your calories. Keep your food log near you, such as in a small notebook in your pocket. Another option is to use a mobile app or website. Some programs will calculate calories for you and show you how many calories you have left each time you add an item to the log. WHAT ARE SOME CALORIE COUNTING TIPS?  Use your calories on foods and drinks that will fill you up and not leave you hungry. Some examples of this include foods  like nuts and nut butters, vegetables, lean proteins, and high-fiber foods (more than 5 g fiber per serving).  Eat nutritious foods and avoid empty calories. Empty calories are calories you get from foods or beverages that do not have many nutrients, such as candy and soda. It is better to have a nutritious high-calorie food (such as an avocado) than a food with few nutrients (such as a bag of chips).  Know how many calories are in the foods you eat most often. This way, you do not have to look up how many calories they have each time you eat them.  Look out for foods that may seem like low-calorie foods but are really high-calorie foods, such as baked goods, soda, and fat-free candy.  Pay attention to calories in drinks. Drinks such as sodas, specialty coffee drinks, alcohol, and juices have a lot of calories yet do not fill you up. Choose low-calorie drinks like water and diet drinks.  Focus your calorie counting efforts on higher calorie items. Logging the calories in a garden salad that contains only vegetables is less important than calculating the calories in a milk shake.  Find a way of tracking calories that works for you. Get creative. Most people who are successful find ways to keep track of how much they eat in a day, even if they do not count every calorie. WHAT ARE SOME PORTION CONTROL TIPS?  Know how many calories are in a serving. This will help you know how many servings of a certain food you can have.  Use a measuring cup to measure serving sizes. This is helpful when you start out. With time, you will be able to estimate serving sizes for some foods.  Take some time to put servings of different foods on your favorite plates, bowls, and cups so you know what a serving looks like.  Try not to eat straight from a bag or box. Doing this can lead to overeating. Put the amount you would like to eat in a cup or on a plate to make sure you are eating the right portion.  Use smaller  plates, glasses, and bowls to prevent overeating. This is a quick and easy way to practice portion control. If your plate is smaller, less food can fit on it.  Try not to multitask while eating, such as watching TV or using your computer. If it is time to eat, sit down at a table and enjoy your food. Doing this will help you to start recognizing when you are full. It will also make you more aware of what and how much you are eating. HOW CAN I CALORIE COUNT WHEN EATING OUT?  Ask for smaller portion sizes or child-sized portions.  Consider sharing an entree and sides instead of getting your own entree.  If you get your own entree, eat only half. Ask for a box at the beginning of your meal and put the rest of your entree in it so you are not tempted to eat it.  Look for the calories on the menu. If calories are listed, choose the lower calorie options.  Choose dishes that include vegetables, fruits, whole grains, low-fat dairy products, and lean protein. Focusing on smart food choices from each of the 5 food groups can help you stay on track at restaurants.  Choose items that are boiled, broiled, grilled, or steamed.  Choose water, milk, unsweetened iced tea, or other drinks without added sugars. If you want an alcoholic beverage, choose a lower calorie option. For example, a regular margarita can have up to 700 calories and a glass of Vivyan Biggers has around 150.  Stay away from items that are buttered, battered, fried, or served with cream sauce. Items labeled "crispy" are usually fried, unless stated otherwise.  Ask for dressings, sauces, and syrups on the side. These are usually very high in calories, so do not eat much of them.  Watch out for salads. Many people think salads are a healthy option, but this is often not the case. Many salads come with bacon, fried chicken, lots of cheese, fried chips, and dressing. All of these items have a lot of calories. If you want a salad, choose a garden salad  and ask for grilled meats or steak. Ask for the dressing on the side, or ask for olive oil and vinegar or lemon to use as dressing.  Estimate how many servings of a food you are given. For example, a serving of cooked rice is  cup or about the size of half a tennis ball or one cupcake wrapper. Knowing serving sizes will help you be aware of how much food you are eating at restaurants. The list below tells you how big or small some common portion sizes are based on everyday objects.  1 oz--4 stacked dice.  3 oz--1 deck of cards.  1 tsp--1 dice.  1 Tbsp-- a Ping-Pong ball.  2 Tbsp--1 Ping-Pong ball.   cup--1 tennis ball or 1 cupcake wrapper.  1 cup--1 baseball.   This information is not intended to replace advice given to you by your health care provider. Make sure you discuss any questions you have with your health care provider.   Document Released: 06/18/2005 Document  Revised: 07/09/2014 Document Reviewed: 04/23/2013 Elsevier Interactive Patient Education 2016 Elsevier Inc.        Fall Risk Fall Risk  03/28/2017 10/31/2016 03/22/2016 01/31/2016 10/26/2015  Falls in the past year? No No No No No  Number falls in past yr: - - - - -  Comment - - - - -  Injury with Fall? - - - - -   Depression Screen PHQ 2/9 Scores 03/28/2017 10/31/2016 01/31/2016 10/26/2015  PHQ - 2 Score 1 0 0 0  PHQ- 9 Score 1 - - -     Cognitive Function MMSE - Mini Mental State Exam 03/28/2017  Orientation to time 5  Orientation to Place 5  Registration 3  Attention/ Calculation 4  Recall 2  Language- name 2 objects 2  Language- repeat 1  Language- follow 3 step command 3  Language- read & follow direction 1  Write a sentence 1  Copy design 1  Total score 28        Immunization History  Administered Date(s) Administered  . Influenza Split 05/04/2011, 04/28/2012  . Influenza,inj,Quad PF,6+ Mos 05/18/2013, 04/09/2014, 04/15/2015, 04/26/2016  . Pneumococcal Conjugate-13 10/08/2014  . Pneumococcal  Polysaccharide-23 05/04/2011  . Tdap 04/28/2012   Screening Tests Health Maintenance  Topic Date Due  . URINE MICROALBUMIN  10/25/2016  . INFLUENZA VACCINE  01/30/2017  . HEMOGLOBIN A1C  08/17/2017  . OPHTHALMOLOGY EXAM  08/29/2017  . FOOT EXAM  10/31/2017  . MAMMOGRAM  04/23/2018  . COLONOSCOPY  02/19/2022  . TETANUS/TDAP  04/28/2022  . DEXA SCAN  Completed  . Hepatitis C Screening  Completed  . PNA vac Low Risk Adult  Completed      Plan:    Continue doing brain stimulating activities (puzzles, reading, adult coloring books, staying active) to keep memory sharp.   Continue to eat heart healthy diet (full of fruits, vegetables, whole grains, lean protein, water--limit salt, fat, and sugar intake) and increase physical activity as tolerated.   I have personally reviewed and noted the following in the patient's chart:   . Medical and social history . Use of alcohol, tobacco or illicit drugs  . Current medications and supplements . Functional ability and status . Nutritional status . Physical activity . Advanced directives . List of other physicians . Vitals . Screenings to include cognitive, depression, and falls . Referrals and appointments  In addition, I have reviewed and discussed with patient certain preventive protocols, quality metrics, and best practice recommendations. A written personalized care plan for preventive services as well as general preventive health recommendations were provided to patient.     Michiel Cowboy, RN  03/28/2017  Medical screening examination/treatment/procedure(s) were performed by non-physician practitioner and as supervising physician I was immediately available for consultation/collaboration. I agree with above. Cathlean Cower, MD

## 2017-04-29 DIAGNOSIS — Z803 Family history of malignant neoplasm of breast: Secondary | ICD-10-CM | POA: Diagnosis not present

## 2017-04-29 DIAGNOSIS — Z1231 Encounter for screening mammogram for malignant neoplasm of breast: Secondary | ICD-10-CM | POA: Diagnosis not present

## 2017-04-29 LAB — HM MAMMOGRAPHY

## 2017-05-07 ENCOUNTER — Encounter: Payer: Self-pay | Admitting: Internal Medicine

## 2017-05-07 ENCOUNTER — Ambulatory Visit: Payer: Medicare HMO | Admitting: Internal Medicine

## 2017-05-07 VITALS — BP 138/88 | HR 67 | Temp 97.8°F | Ht 62.0 in | Wt 186.0 lb

## 2017-05-07 DIAGNOSIS — Z0000 Encounter for general adult medical examination without abnormal findings: Secondary | ICD-10-CM | POA: Diagnosis not present

## 2017-05-07 DIAGNOSIS — R7303 Prediabetes: Secondary | ICD-10-CM | POA: Diagnosis not present

## 2017-05-07 DIAGNOSIS — R0789 Other chest pain: Secondary | ICD-10-CM | POA: Diagnosis not present

## 2017-05-07 DIAGNOSIS — J309 Allergic rhinitis, unspecified: Secondary | ICD-10-CM

## 2017-05-07 DIAGNOSIS — E559 Vitamin D deficiency, unspecified: Secondary | ICD-10-CM | POA: Insufficient documentation

## 2017-05-07 DIAGNOSIS — E538 Deficiency of other specified B group vitamins: Secondary | ICD-10-CM | POA: Diagnosis not present

## 2017-05-07 MED ORDER — CETIRIZINE HCL 10 MG PO TABS: 10.0000 mg | ORAL_TABLET | Freq: Every day | ORAL | 11 refills | Status: DC

## 2017-05-07 MED ORDER — TRIAMCINOLONE ACETONIDE 55 MCG/ACT NA AERO: 2.0000 | INHALATION_SPRAY | Freq: Every day | NASAL | 12 refills | Status: DC

## 2017-05-07 NOTE — Patient Instructions (Signed)
OK to continue the OTC B12 vitamin (the 5000 units per day)  OK to finish the Vit D prescription of the 50,000 units per week for 12 wks, but then change to Vit D (OTC) at 2000 units per day  Please continue all other medications as before, and refills have been done if requested.  Please have the pharmacy call with any other refills you may need.  Please continue your efforts at being more active, low cholesterol diet, and weight control.  Please keep your appointments with your specialists as you may have planned  Please return in 6 months, or sooner if needed, with Lab testing done 3-5 days before
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
## 2017-05-11 NOTE — Assessment & Plan Note (Signed)
Mild to mod, for anithist and nasal steroid restart,  to f/u any worsening symptoms or concerns

## 2017-05-11 NOTE — Assessment & Plan Note (Signed)
Recent dx, for oral replacement, and f/u lab next visit

## 2017-05-11 NOTE — Assessment & Plan Note (Signed)
stable overall by history and exam, recent data reviewed with pt, and pt to continue medical treatment as before,  to f/u any worsening symptoms or concerns Lab Results  Component Value Date   HGBA1C 6.1 02/14/2017

## 2017-05-11 NOTE — Assessment & Plan Note (Signed)
Exam benign, d/w pt  - declines ecg or further evaluation such as stress test

## 2017-05-20 ENCOUNTER — Encounter: Payer: Self-pay | Admitting: Family Medicine

## 2017-05-20 ENCOUNTER — Ambulatory Visit: Payer: Medicare HMO | Admitting: Family Medicine

## 2017-05-20 VITALS — BP 130/80 | HR 94 | Temp 99.9°F | Wt 187.0 lb

## 2017-05-20 DIAGNOSIS — B9789 Other viral agents as the cause of diseases classified elsewhere: Secondary | ICD-10-CM | POA: Diagnosis not present

## 2017-05-20 DIAGNOSIS — J069 Acute upper respiratory infection, unspecified: Secondary | ICD-10-CM

## 2017-05-20 NOTE — Patient Instructions (Signed)
Viral Respiratory Infection A respiratory infection is an illness that affects part of the respiratory system, such as the lungs, nose, or throat. Most respiratory infections are caused by either viruses or bacteria. A respiratory infection that is caused by a virus is called a viral respiratory infection. Common types of viral respiratory infections include:  A cold.  The flu (influenza).  A respiratory syncytial virus (RSV) infection.  How do I know if I have a viral respiratory infection? Most viral respiratory infections cause:  A stuffy or runny nose.  Yellow or green nasal discharge.  A cough.  Sneezing.  Fatigue.  Achy muscles.  A sore throat.  Sweating or chills.  A fever.  A headache.  How are viral respiratory infections treated? If influenza is diagnosed early, it may be treated with an antiviral medicine that shortens the length of time a person has symptoms. Symptoms of viral respiratory infections may be treated with over-the-counter and prescription medicines, such as:  Expectorants. These make it easier to cough up mucus.  Decongestant nasal sprays.  Health care providers do not prescribe antibiotic medicines for viral infections. This is because antibiotics are designed to kill bacteria. They have no effect on viruses. How do I know if I should stay home from work or school? To avoid exposing others to your respiratory infection, stay home if you have:  A fever.  A persistent cough.  A sore throat.  A runny nose.  Sneezing.  Muscles aches.  Headaches.  Fatigue.  Weakness.  Chills.  Sweating.  Nausea.  Follow these instructions at home:  Rest as much as possible.  Take over-the-counter and prescription medicines only as told by your health care provider.  Drink enough fluid to keep your urine clear or pale yellow. This helps prevent dehydration and helps loosen up mucus.  Gargle with a salt-water mixture 3-4 times per day or  as needed. To make a salt-water mixture, completely dissolve -1 tsp of salt in 1 cup of warm water.  Use nose drops made from salt water to ease congestion and soften raw skin around your nose.  Do not drink alcohol.  Do not use tobacco products, including cigarettes, chewing tobacco, and e-cigarettes. If you need help quitting, ask your health care provider. Contact a health care provider if:  Your symptoms last for 10 days or longer.  Your symptoms get worse over time.  You have a fever.  You have severe sinus pain in your face or forehead.  The glands in your jaw or neck become very swollen. Get help right away if:  You feel pain or pressure in your chest.  You have shortness of breath.  You faint or feel like you will faint.  You have severe and persistent vomiting.  You feel confused or disoriented. This information is not intended to replace advice given to you by your health care provider. Make sure you discuss any questions you have with your health care provider. Document Released: 03/28/2005 Document Revised: 11/24/2015 Document Reviewed: 11/24/2014 Elsevier Interactive Patient Education  2017 [employer].   Consider OTC plain Mucinex and Nasacort.   Follow up for any increasing fever or other concerns.

## 2017-05-20 NOTE — Progress Notes (Signed)
Subjective:     Patient ID: Debra Atkinson, female   DOB: August 18, 1945, 71 y.o.   MRN: [id number]  HPI Patient is seen as a work in with onset 2 days ago of multiple respiratory symptoms including sore throat, nasal congestion, cough, intermittent headache, and bilateral ear fullness. She's tried over-the-counter Advil which provides some temporary relief. Low-grade fever. Denies any nausea, vomiting, or diarrhea. She's had diffuse body aches. No sick contacts. Nonsmoker.  Past Medical History:  Diagnosis Date  . Concussion '06, '11  . Diabetes mellitus    diet management  . Family history of colon cancer 02/28/2016  . Fibromyalgia 10/08/2013  . Genital warts    treated podophyllin  . Hyperlipidemia    diet managed  . Varicella    Past Surgical History:  Procedure Laterality Date  . APPENDECTOMY  1979  . [place]  . CHOLECYSTECTOMY  1979   laparotomy    reports that  has never smoked. she has never used smokeless tobacco. She reports that she does not drink alcohol or use drugs. family history includes COPD in her mother; Cancer (age of onset: 45) in her mother; Diabetes in her maternal grandfather and mother; Heart disease in her father and mother; Rheumatic fever in her father. Allergies  Allergen Reactions  . Prednisone Itching    Redness from injection     Review of Systems  Constitutional: Positive for chills, fatigue and fever.  HENT: Positive for congestion and sore throat.   Respiratory: Positive for cough. Negative for shortness of breath and wheezing.   Cardiovascular: Negative for chest pain.  Gastrointestinal: Negative for nausea and vomiting.  Neurological: Positive for headaches.       Objective:   Physical Exam  Constitutional: She appears well-developed and well-nourished.  HENT:  Right Ear: External ear normal.  Left Ear: External ear normal.  Mouth/Throat: Oropharynx is clear and moist. No oropharyngeal exudate.  Neck: Neck supple.   Cardiovascular: Normal rate and regular rhythm.  Pulmonary/Chest: Effort normal and breath sounds normal. No respiratory distress. She has no wheezes. She has no rales.  Lymphadenopathy:    She has no cervical adenopathy.       Assessment:     Viral URI with cough. Nonfocal exam    Plan:     -Continue Advil as needed for body aches -Consider over-the-counter plain Mucinex -Stay well-hydrated and get plenty of rest -Follow-up for any increasing fever or other concerns  Eulas Post MD [facility] at [facility]

## 2017-06-19 ENCOUNTER — Ambulatory Visit: Payer: Medicare HMO | Admitting: *Deleted

## 2017-06-19 ENCOUNTER — Ambulatory Visit: Payer: Medicare HMO | Admitting: Family Medicine

## 2017-06-19 ENCOUNTER — Encounter: Payer: Self-pay | Admitting: Family Medicine

## 2017-06-19 ENCOUNTER — Ambulatory Visit: Payer: Self-pay | Admitting: *Deleted

## 2017-06-19 VITALS — BP 134/74 | HR 86 | Temp 97.8°F | Ht 62.0 in | Wt 190.4 lb

## 2017-06-19 DIAGNOSIS — R079 Chest pain, unspecified: Secondary | ICD-10-CM

## 2017-06-19 NOTE — Patient Instructions (Signed)
Reassuring EKG  The chest pain and heart racing you are feeling appear to be stress related with recent loss of your son. You also had a reassuring stress test within 2 years. I still would like for you to go ahead and schedule a follow up with Dr. [name] Reichmann in the next few weeks to touch base. If you have new or worsening symptoms- seek care immediately  I think seeing one of our counselors would really help you- please consider calling them  Hospice grief counseling is also a great option

## 2017-06-19 NOTE — Telephone Encounter (Signed)
  Reason for Disposition . Problems with anxiety or stress  Protocols used: HEART RATE AND HEARTBEAT QUESTIONS-A-AH

## 2017-06-19 NOTE — Telephone Encounter (Signed)
Noted  

## 2017-06-19 NOTE — Telephone Encounter (Signed)
   Answer Assessment - Initial Assessment Questions 1. DESCRIPTION: "Please describe your heart rate or heart beat that you are having" (e.g., fast/slow, regular/irregular, skipped or extra beats, "palpitations")     Patient feels that she is having palpitations- at times 2. ONSET: "When did it start?" (Minutes, hours or days)      12/5- she lost her son and she has had heart symptoms since hid death 5. DURATION: "How long does it last" (e.g., seconds, minutes, hours)     Can last a extended period of time 4. PATTERN "Does it come and go, or has it been constant since it started?"  "Does it get worse with exertion?"   "Are you feeling it now?"     Comes and goes- she feels that her heart is beating fast now  5. TAP: "Using your hand, can you tap out what you are feeling on a chair or table in front of you, so that I can hear?" (Note: not all patients can do this)        6. HEART RATE: "Can you tell me your heart rate?" "How many beats in 15 seconds?"  (Note: not all patients can do this)       Patient has dental appointment- BP 7. RECURRENT SYMPTOM: "Have you ever had this before?" If so, ask: "When was the last time?" and "What happened that time?"      *No Answer* 8. CAUSE: "What do you think is causing the palpitations?"     *No Answer* 9. CARDIAC HISTORY: "Do you have any history of heart disease?" (e.g., heart attack, angina, bypass surgery, angioplasty, arrhythmia)      *No Answer* 10. OTHER SYMPTOMS: "Do you have any other symptoms?" (e.g., dizziness, chest pain, sweating, difficulty breathing)       *No Answer* 11. PREGNANCY: "Is there any chance you are pregnant?" "When was your last menstrual period?"       *No Answer*  Protocols used: HEART RATE AND HEARTBEAT QUESTIONS-A-AH

## 2017-06-19 NOTE — Telephone Encounter (Signed)
Patient  States her son died suddenly 07-02-23 and since that loss she has not been sleeping. She has started having chest heaviness and heart racing that comes and goes. She is having it now and she is on her way to her dentist. Encouraged her to have them check her BP when she arrives- it is BP 166/84 P 71  . Patient is under a lot of stress dealing with her loss and she needs some help- acute appointment made for evaluation today.

## 2017-06-19 NOTE — Progress Notes (Signed)
Subjective:  Debra Atkinson is a 71 y.o. year old very pleasant female patient who presents for/with See problem oriented charting ROS- no fever, chills, cough, congestion. No shortness of breath, lightheadedness. Does have chest pressure.    Past Medical History-  Patient Active Problem List   Diagnosis Date Noted  . B12 deficiency 05/07/2017  . Vitamin D deficiency 05/07/2017  . Fatigue 02/13/2017  . Lateral epicondylitis of left elbow 10/31/2016  . Toe pain, left 10/31/2016  . Posterior neck pain 10/31/2016  . 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 Thyroid nodule 06/27/2016  . Pain in both knees 06/27/2016  . Mass of right side of neck 04/29/2016  . Dizziness 03/07/2016  . Family history of colon cancer 02/28/2016  . Constipation 02/28/2016  . Acute upper respiratory infection 10/26/2015  . UTI (urinary tract infection) 07/29/2015  . Cough 05/05/2015  . Lower back pain 04/15/2015  . BPV (benign positional vertigo) 11/09/2014  . Atypical chest pain 10/08/2014  . Upper airway cough syndrome 06/08/2014  . Sinusitis, chronic 04/09/2014  . Allergic conjunctivitis 04/09/2014  . Urinary frequency 02/18/2014  . Eustachian tube dysfunction 12/04/2013  . Fibromyalgia 10/08/2013  . Low back pain 09/18/2012  . Allergic rhinitis 09/18/2012  . Need for prophylactic vaccination and inoculation against influenza 04/28/2012  . Encounter for preventative adult health care exam with abnormal findings 04/28/2012  . Pre-diabetes 04/25/2011  . Hyperlipidemia     Medications- reviewed and updated Current Outpatient Medications  Medication Sig Dispense Refill  . cetirizine (ZYRTEC) 10 MG tablet Take 1 tablet (10 mg total) daily by mouth. 30 tablet 11  . Cholecalciferol ([facility]S D3) 2000 units CAPS Take by mouth.    . Cyanocobalamin ([facility]S VITAMIN B-12 PO) Take 2,500 tablets by mouth daily.     [name] ibuprofen (ADVIL,MOTRIN) 200 MG tablet Take 200 mg by mouth every 6 (six) hours as  needed.    [name] tiZANidine (ZANAFLEX) 4 MG tablet Take 1 tablet (4 mg total) by mouth every 6 (six) hours as needed for muscle spasms. 30 tablet 1  . triamcinolone (NASACORT AQ) 55 MCG/ACT AERO nasal inhaler Place 2 sprays daily into the nose. 1 Inhaler 12  . aspirin 81 MG EC tablet Take 1 tablet (81 mg total) by mouth daily. Swallow whole. (Patient not taking: Reported on 06/19/2017) 30 tablet 12   No current facility-administered medications for this visit.     Objective: BP 134/74 (BP Location: Left Arm, Patient Position: Sitting, Cuff Size: Large)   Pulse 86   Temp 97.8 F (36.6 C) (Oral)   Ht 5\' 2"  (1.575 m)   Wt 190 lb 6.4 oz (86.4 kg)   SpO2 97%   BMI 34.82 kg/m  Gen: NAD, resting comfortably CV: RRR no murmurs rubs or gallops No reproducible chest wall pain Lungs: CTAB no crackles, wheeze, rhonchi Abdomen: soft/nontender/nondistended/normal bowel sounds. obese Ext: no edema Skin: warm, dry  EKG: Sinus rhythm with rate 73, normal axis, normal intervals, no hypertrophy, no st or t wave changes  Assessment/Plan:   Chest pain, unspecified type - Plan: EKG 12-Lead S: Patient's son died of a massive stroke on 07/05/23. Starting December 5th she has had periods of chest tightness lasting a few minutes in times when she is thinking about him. Other stressor including no longer able to visit with her great grandkids (from the son that just died) since about may and granddaughter unable to chat with her. Her [employer] faith is also very important to  her and she is not certain about her son's acceptance of the same faith before he died. Her husband also had a heart attack several months ago. She is not having any exertional pain such as with going up stairs- only happens when thinking about son. Also feels like heart rate goes up during those times. No left arm or neck pain with this. No shortness of breath. No lightheadedness with this. At times perhaps mild left arm pain but never  with the chest pain.   Apparently saw Dr. Velora Heckler years ago and had reassuring stress test. Also had low risk pharmacological stress test 12/02/2014 with Dr. [name] Reichmann.  A/P: 71 year old female with chest pain and heart racing/palpitations only when she thinks of loss of her son who died of stroke 2 weeks ago- likely stress related chest pain. Extended counseling- strongly advised hospice grief counseling or [facility]. Nursing team gave patient phq9 and GAD7- obviously elevated with recent stressors but doubt this means GAD or Depression- more likely from grieving/stress- can be rechecked when she sees Dr. [name] Reichmann if thought appopriate.  Patient Instructions  Reassuring EKG  The chest pain and heart racing you are feeling appear to be stress related with recent loss of your son. You also had a reassuring stress test within 2 years. I still would like for you to go ahead and schedule a follow up with Dr. [name] Reichmann in the next few weeks to touch base. If you have new or worsening symptoms- seek care immediately  I think seeing one of our counselors would really help you- please consider calling them  Hospice grief counseling is also a great option  Future Appointments  Date Time Provider [place]  07/05/2017  9:20 AM Biagio Borg, MD [facility]  11/05/2017 10:20 AM Biagio Borg, MD [facility]    Orders Placed This Encounter  Procedures  . EKG 12-Lead    Order Specific Question:   Where should this test be performed    Answer:   Other   The duration of face-to-face time during this visit was greater than 25 minutes. Greater than 50% of this time was spent in counseling, explanation of diagnosis, planning of further management, and/or coordination of care including discussion/counseling of loss of son and other stressors, meaning of EKG, follow up precautions  Return precautions advised. Particularly for new or worsening symptoms.  Garret Reddish, MD

## 2017-07-05 ENCOUNTER — Encounter: Payer: Self-pay | Admitting: Internal Medicine

## 2017-07-05 ENCOUNTER — Ambulatory Visit: Payer: Medicare HMO | Admitting: Internal Medicine

## 2017-07-05 ENCOUNTER — Ambulatory Visit (INDEPENDENT_AMBULATORY_CARE_PROVIDER_SITE_OTHER): Payer: Medicare HMO | Admitting: Internal Medicine

## 2017-07-05 ENCOUNTER — Other Ambulatory Visit (INDEPENDENT_AMBULATORY_CARE_PROVIDER_SITE_OTHER): Payer: Medicare HMO

## 2017-07-05 VITALS — BP 128/86 | HR 73 | Temp 98.3°F | Ht 62.0 in | Wt 188.0 lb

## 2017-07-05 DIAGNOSIS — E538 Deficiency of other specified B group vitamins: Secondary | ICD-10-CM | POA: Diagnosis not present

## 2017-07-05 DIAGNOSIS — R7303 Prediabetes: Secondary | ICD-10-CM | POA: Diagnosis not present

## 2017-07-05 DIAGNOSIS — R300 Dysuria: Secondary | ICD-10-CM | POA: Diagnosis not present

## 2017-07-05 DIAGNOSIS — E559 Vitamin D deficiency, unspecified: Secondary | ICD-10-CM

## 2017-07-05 LAB — URINALYSIS, ROUTINE W REFLEX MICROSCOPIC
BILIRUBIN URINE: NEGATIVE
HGB URINE DIPSTICK: NEGATIVE
Ketones, ur: NEGATIVE
LEUKOCYTES UA: NEGATIVE
NITRITE: NEGATIVE
RBC / HPF: NONE SEEN (ref 0–?)
Specific Gravity, Urine: 1.01 (ref 1.000–1.030)
TOTAL PROTEIN, URINE-UPE24: NEGATIVE
URINE GLUCOSE: NEGATIVE
Urobilinogen, UA: 0.2 (ref 0.0–1.0)
pH: 6.5 (ref 5.0–8.0)

## 2017-07-05 NOTE — Patient Instructions (Addendum)
Please continue all other medications as before, and refills have been done if requested.  Please have the pharmacy call with any other refills you may need.  Please continue your efforts at being more active, low cholesterol diet, and weight control.  Please keep your appointments with your specialists as you may have planned  Please go to the LAB in the Basement (turn left off the elevator) for the tests to be done today- just the urine testing  You will be contacted by phone if any changes need to be made immediately.  Otherwise, you will receive a letter about your results with an explanation, but please check with MyChart first.  Please remember to sign up for MyChart if you have not done so, as this will be important to you in the future with finding out test results, communicating by private email, and scheduling acute appointments online when needed.  Please return in 6 months, or sooner if needed, with Lab testing done 3-5 days before

## 2017-07-05 NOTE — Progress Notes (Signed)
Subjective:    Patient ID: Debra Atkinson, female    DOB: 11/26/1945, 72 y.o.   MRN: [id number]  HPI  Here to f/u; overall doing ok,  Pt denies increasing sob or doe, wheezing, orthopnea, PND, increased LE swelling, palpitations, dizziness or syncope.  Pt denies new neurological symptoms such as new headache, or facial or extremity weakness or numbness.  Pt denies polydipsia, polyuria  Pt states overall good compliance with meds, mostly trying to follow appropriate diet, with wt overall stable,  but little exercise however.  Pt was recently found to have b12 and vit d deficiency; now taking the otc supplements well, due for f/u labs.  Also mentions has chronic recurring mild right costal margin pain and right groin pain for at least 1 yr without worsening.  Also with occas fleeting bilat chest pains without assoc symptoms  Also with some urinary freq and odd odor in the past 3 days, Denies urinary symptoms such as urgency, flank pain, hematuria or n/v, fever, chills., but just does not feel good.  Son died 09-Dec-2024with ICH/stroke with increased stress and grief recently, denies worsening depression or SI or HI Past Medical History:  Diagnosis Date  . Concussion '06, '11  . Diabetes mellitus    diet management  . Family history of colon cancer 02/28/2016  . Fibromyalgia 10/08/2013  . Genital warts    treated podophyllin  . Hyperlipidemia    diet managed  . Varicella    Past Surgical History:  Procedure Laterality Date  . APPENDECTOMY  1979  . [place]  . CHOLECYSTECTOMY  1979   laparotomy    reports that  has never smoked. she has never used smokeless tobacco. She reports that she does not drink alcohol or use drugs. family history includes COPD in her mother; Cancer (age of onset: 24) in her mother; Diabetes in her maternal grandfather and mother; Heart disease in her father and mother; Rheumatic fever in her father. Allergies  Allergen Reactions  . Prednisone Itching   Redness from injection   Current Outpatient Medications on File Prior to Visit  Medication Sig Dispense Refill  . aspirin 81 MG EC tablet Take 1 tablet (81 mg total) by mouth daily. Swallow whole. 30 tablet 12  . cetirizine (ZYRTEC) 10 MG tablet Take 1 tablet (10 mg total) daily by mouth. 30 tablet 11  . Cholecalciferol (CVS D3) 2000 units CAPS Take by mouth.    . Cyanocobalamin (CVS VITAMIN B-12 PO) Take 2,500 tablets by mouth daily.     [name] ibuprofen (ADVIL,MOTRIN) 200 MG tablet Take 200 mg by mouth every 6 (six) hours as needed.    [name] tiZANidine (ZANAFLEX) 4 MG tablet Take 1 tablet (4 mg total) by mouth every 6 (six) hours as needed for muscle spasms. 30 tablet 1  . triamcinolone (NASACORT AQ) 55 MCG/ACT AERO nasal inhaler Place 2 sprays daily into the nose. 1 Inhaler 12   No current facility-administered medications on file prior to visit.    Review of Systems  Constitutional: Negative for other unusual diaphoresis or sweats HENT: Negative for ear discharge or swelling Eyes: Negative for other worsening visual disturbances Respiratory: Negative for stridor or other swelling  Gastrointestinal: Negative for worsening distension or other blood Genitourinary: Negative for retention or other urinary change Musculoskeletal: Negative for other MSK pain or swelling Skin: Negative for color change or other new lesions Neurological: Negative for worsening tremors and other numbness  Psychiatric/Behavioral: Negative for worsening  agitation or other fatigue All other system neg per pt    Objective:   Physical Exam BP 128/86   Pulse 73   Temp 98.3 F (36.8 C) (Oral)   Ht 5\' 2"  (1.575 m)   Wt 188 lb (85.3 kg)   SpO2 98%   BMI 34.39 kg/m  VS noted,  Constitutional: Pt appears in NAD HENT: Head: NCAT.  Right Ear: External ear normal.  Left Ear: External ear normal.  Eyes: . Pupils are equal, round, and reactive to light. Conjunctivae and EOM are normal Nose: without d/c or  deformity Neck: Neck supple. Gross normal ROM Cardiovascular: Normal rate and regular rhythm.   Pulmonary/Chest: Effort normal and breath sounds without rales or wheezing.  Abd:  Soft, NT, ND, + BS, no organomegaly without flank tender; has right costal margin tender without swelling or rash Neurological: Pt is alert. At baseline orientation, motor grossly intact Skin: Skin is warm. No rashes, other new lesions, no LE edema Psychiatric: Pt behavior is normal without agitation, + nervous mild dysphoric mood and affect No other exam findings    Assessment & Plan:

## 2017-07-06 DIAGNOSIS — R300 Dysuria: Secondary | ICD-10-CM | POA: Insufficient documentation

## 2017-07-06 LAB — URINE CULTURE
MICRO NUMBER: 90015104
SPECIMEN QUALITY:: ADEQUATE

## 2017-07-06 NOTE — Assessment & Plan Note (Signed)
Lab Results  Component Value Date   HGBA1C 6.1 02/14/2017  stable overall by history and exam, recent data reviewed with pt, and pt to continue medical treatment as before,  to f/u any worsening symptoms or concerns

## 2017-07-06 NOTE — Assessment & Plan Note (Signed)
Also for f/u Vit D level,  to f/u any worsening symptoms or concerns

## 2017-07-06 NOTE — Assessment & Plan Note (Signed)
?   Clinical significance, exam benign, but will check urine studies, and tx results as appropriate

## 2017-07-06 NOTE — Assessment & Plan Note (Signed)
[facility] for f/u lab today,  to f/u any worsening symptoms or concerns

## 2017-07-09 ENCOUNTER — Telehealth: Payer: Self-pay

## 2017-07-09 NOTE — Telephone Encounter (Signed)
Noted    Copied from [place] [id number]. Topic: Inquiry >> Jul 09, 2017  2:09 PM Arletha Grippe wrote: Reason for CRM: pt would like call when labs are back - she can not get onto my chart right now, [phone]

## 2017-07-11 ENCOUNTER — Ambulatory Visit: Payer: Self-pay

## 2017-07-11 NOTE — Telephone Encounter (Signed)
  Pt. Not sure if she punctured her skin while cleaning up outside. Will watch finger in question. Tetanus vaccine  is still good.

## 2017-07-18 DIAGNOSIS — R300 Dysuria: Secondary | ICD-10-CM | POA: Diagnosis not present

## 2017-07-23 ENCOUNTER — Encounter: Payer: Self-pay | Admitting: Internal Medicine

## 2017-07-23 ENCOUNTER — Ambulatory Visit (INDEPENDENT_AMBULATORY_CARE_PROVIDER_SITE_OTHER)
Admission: RE | Admit: 2017-07-23 | Discharge: 2017-07-23 | Disposition: A | Discharge status: Home / Self Care | Payer: Medicare HMO | Source: Ambulatory Visit | Attending: Internal Medicine | Admitting: Internal Medicine

## 2017-07-23 ENCOUNTER — Ambulatory Visit (INDEPENDENT_AMBULATORY_CARE_PROVIDER_SITE_OTHER): Payer: Medicare HMO | Admitting: Internal Medicine

## 2017-07-23 VITALS — BP 146/92 | HR 72 | Temp 98.1°F | Ht 62.0 in | Wt 187.0 lb

## 2017-07-23 DIAGNOSIS — R0789 Other chest pain: Secondary | ICD-10-CM

## 2017-07-23 DIAGNOSIS — R7303 Prediabetes: Secondary | ICD-10-CM | POA: Diagnosis not present

## 2017-07-23 DIAGNOSIS — R079 Chest pain, unspecified: Secondary | ICD-10-CM

## 2017-07-23 DIAGNOSIS — M797 Fibromyalgia: Secondary | ICD-10-CM | POA: Diagnosis not present

## 2017-07-23 NOTE — Progress Notes (Signed)
Subjective:    Patient ID: Debra Atkinson, female    DOB: 05-17-1946, 72 y.o.   MRN: [id number]  HPI  Here to f/u with c/o left CP in a sore to touch area just above left breast, sharp, without radiation, sob, n/v, diaphoresis, palp or dizziness, and is not exertional or pleuritic but is sore to touch and worse to turn in bed. Pt denies wheezing, orthopnea, PND, increased LE swelling, or syncope.   Pt denies polydipsia, polyuria. Pt denies new neurological symptoms such as new headache, or facial or extremity weakness or numbness No other interval hx or complaints. Denies worsening reflux, abd pain, dysphagia, n/v, bowel change or blood.  Past Medical History:  Diagnosis Date  . Concussion '06, '11  . Diabetes mellitus    diet management  . Family history of colon cancer 02/28/2016  . Fibromyalgia 10/08/2013  . Genital warts    treated podophyllin  . Hyperlipidemia    diet managed  . Varicella    Past Surgical History:  Procedure Laterality Date  . APPENDECTOMY  1979  . [place]  . CHOLECYSTECTOMY  1979   laparotomy    reports that  has never smoked. she has never used smokeless tobacco. She reports that she does not drink alcohol or use drugs. family history includes COPD in her mother; Cancer (age of onset: 55) in her mother; Diabetes in her maternal grandfather and mother; Heart disease in her father and mother; Rheumatic fever in her father. Allergies  Allergen Reactions  . Prednisone Itching    Redness from injection   Current Outpatient Medications on File Prior to Visit  Medication Sig Dispense Refill  . aspirin 81 MG EC tablet Take 1 tablet (81 mg total) by mouth daily. Swallow whole. 30 tablet 12  . cetirizine (ZYRTEC) 10 MG tablet Take 1 tablet (10 mg total) daily by mouth. 30 tablet 11  . Cholecalciferol (CVS D3) 2000 units CAPS Take by mouth.    . Cyanocobalamin (CVS VITAMIN B-12 PO) Take 2,500 tablets by mouth daily.     [name] ibuprofen (ADVIL,MOTRIN) 200  MG tablet Take 200 mg by mouth every 6 (six) hours as needed.    [name] tiZANidine (ZANAFLEX) 4 MG tablet Take 1 tablet (4 mg total) by mouth every 6 (six) hours as needed for muscle spasms. 30 tablet 1  . triamcinolone (NASACORT AQ) 55 MCG/ACT AERO nasal inhaler Place 2 sprays daily into the nose. 1 Inhaler 12   No current facility-administered medications on file prior to visit.    Review of Systems  Constitutional: Negative for other unusual diaphoresis or sweats HENT: Negative for ear discharge or swelling Eyes: Negative for other worsening visual disturbances Respiratory: Negative for stridor or other swelling  Gastrointestinal: Negative for worsening distension or other blood Genitourinary: Negative for retention or other urinary change Musculoskeletal: Negative for other MSK pain or swelling Skin: Negative for color change or other new lesions Neurological: Negative for worsening tremors and other numbness  Psychiatric/Behavioral: Negative for worsening agitation or other fatigue All other system neg per pt    Objective:   Physical Exam BP (!) 146/92   Pulse 72   Temp 98.1 F (36.7 C) (Oral)   Ht 5\' 2"  (1.575 m)   Wt 187 lb (84.8 kg)   SpO2 98%   BMI 34.20 kg/m  VS noted,  Constitutional: Pt appears in NAD HENT: Head: NCAT.  Right Ear: External ear normal.  Left Ear: External ear normal.  Eyes: . Pupils are equal, round, and reactive to light. Conjunctivae and EOM are normal Nose: without d/c or deformity Neck: Neck supple. Gross normal ROM Cardiovascular: Normal rate and regular rhythm.   Pulmonary/Chest: Effort normal and breath sounds without rales or wheezing.  + tender area left mid chest above breast without swelling, erythema or rash Abd:  Soft, NT, ND, + BS, no organomegaly Neurological: Pt is alert. At baseline orientation, motor grossly intact Skin: Skin is warm. No rashes, other new lesions, no LE edema Psychiatric: Pt behavior is normal without agitation  No  other exam findings  ECG today I have personally interpreted NSR - no ischemic changes     Assessment & Plan:

## 2017-07-23 NOTE — Patient Instructions (Addendum)
Your EKG was OK today  Please continue all other medications as before, and refills have been done if requested.  Please have the pharmacy call with any other refills you may need.  Please keep your appointments with your specialists as you may have planned  Please go to the XRAY Department in the Basement (go straight as you get off the elevator) for the x-ray testing  You will be contacted by phone if any changes need to be made immediately.  Otherwise, you will receive a letter about your results with an explanation, but please check with MyChart first.  Please remember to sign up for MyChart if you have not done so, as this will be important to you in the future with finding out test results, communicating by private email, and scheduling acute appointments online when needed.

## 2017-07-25 NOTE — Assessment & Plan Note (Signed)
stable overall by history and exam, recent data reviewed with pt, and pt to continue medical treatment as before,  to f/u any worsening symptoms or concerns Lab Results  Component Value Date   HGBA1C 6.1 02/14/2017

## 2017-07-25 NOTE — Assessment & Plan Note (Signed)
Mild, suspect msk but no obvious exertion to cause muscle pull, differential includes rib, neuritic pain; ecg reviewed without concern for cardiac ischemia, for cxr, o/w follow with expectant management

## 2017-07-25 NOTE — Assessment & Plan Note (Signed)
O/w stable, cont to follow, cont same tx

## 2017-07-29 ENCOUNTER — Telehealth: Payer: Self-pay | Admitting: Internal Medicine

## 2017-07-29 MED ORDER — LIDOCAINE 5 % EX PTCH: 1.0000 | MEDICATED_PATCH | CUTANEOUS | 0 refills | Status: DC

## 2017-07-29 NOTE — Telephone Encounter (Signed)
Copied from [place] [id number]. Topic: Quick Communication - See Telephone Encounter >> Jul 29, 2017  1:45 PM Bea Graff, NT wrote: CRM for notification. See Telephone encounter for: Pt calling to receive results from her chest xray  07/29/17.

## 2017-07-29 NOTE — Telephone Encounter (Signed)
Pt has been informed and expressed understanding.   She stated that her shoulder blades has been painful for the last 2 days. She would like to know if she needs to come in for an appt or what could she do for them. Please advise.

## 2017-07-29 NOTE — Telephone Encounter (Signed)
Ok for [employer] patch as needed - done erx

## 2017-07-29 NOTE — Addendum Note (Signed)
Addended by: Biagio Borg on: 07/29/2017 05:41 PM   Modules accepted: Orders

## 2017-08-29 DIAGNOSIS — H524 Presbyopia: Secondary | ICD-10-CM | POA: Diagnosis not present

## 2017-08-29 LAB — HM DIABETES EYE EXAM

## 2017-11-05 ENCOUNTER — Other Ambulatory Visit (INDEPENDENT_AMBULATORY_CARE_PROVIDER_SITE_OTHER): Payer: Medicare HMO

## 2017-11-05 ENCOUNTER — Ambulatory Visit (INDEPENDENT_AMBULATORY_CARE_PROVIDER_SITE_OTHER): Payer: Medicare HMO | Admitting: Internal Medicine

## 2017-11-05 ENCOUNTER — Other Ambulatory Visit: Payer: Self-pay | Admitting: Internal Medicine

## 2017-11-05 VITALS — BP 122/78 | HR 81 | Temp 98.4°F | Ht 62.0 in | Wt 191.0 lb

## 2017-11-05 DIAGNOSIS — E538 Deficiency of other specified B group vitamins: Secondary | ICD-10-CM | POA: Diagnosis not present

## 2017-11-05 DIAGNOSIS — E041 Nontoxic single thyroid nodule: Secondary | ICD-10-CM

## 2017-11-05 DIAGNOSIS — R7303 Prediabetes: Secondary | ICD-10-CM

## 2017-11-05 DIAGNOSIS — E559 Vitamin D deficiency, unspecified: Secondary | ICD-10-CM | POA: Diagnosis not present

## 2017-11-05 DIAGNOSIS — J309 Allergic rhinitis, unspecified: Secondary | ICD-10-CM | POA: Diagnosis not present

## 2017-11-05 DIAGNOSIS — Z0001 Encounter for general adult medical examination with abnormal findings: Secondary | ICD-10-CM

## 2017-11-05 DIAGNOSIS — H8110 Benign paroxysmal vertigo, unspecified ear: Secondary | ICD-10-CM

## 2017-11-05 LAB — URINALYSIS, ROUTINE W REFLEX MICROSCOPIC
Bilirubin Urine: NEGATIVE
Hgb urine dipstick: NEGATIVE
Ketones, ur: NEGATIVE
Nitrite: NEGATIVE
PH: 7.5 (ref 5.0–8.0)
RBC / HPF: NONE SEEN (ref 0–?)
Specific Gravity, Urine: 1.01 (ref 1.000–1.030)
TOTAL PROTEIN, URINE-UPE24: NEGATIVE
URINE GLUCOSE: NEGATIVE
Urobilinogen, UA: 0.2 (ref 0.0–1.0)

## 2017-11-05 LAB — CBC WITH DIFFERENTIAL/PLATELET
BASOS ABS: 0.1 10*3/uL (ref 0.0–0.1)
Basophils Relative: 0.9 % (ref 0.0–3.0)
EOS PCT: 1 % (ref 0.0–5.0)
Eosinophils Absolute: 0.1 10*3/uL (ref 0.0–0.7)
HCT: 40.3 % (ref 36.0–46.0)
HEMOGLOBIN: 13.6 g/dL (ref 12.0–15.0)
LYMPHS ABS: 2.7 10*3/uL (ref 0.7–4.0)
Lymphocytes Relative: 44.5 % (ref 12.0–46.0)
MCHC: 33.8 g/dL (ref 30.0–36.0)
MCV: 82.7 fl (ref 78.0–100.0)
MONOS PCT: 8.7 % (ref 3.0–12.0)
Monocytes Absolute: 0.5 10*3/uL (ref 0.1–1.0)
NEUTROS PCT: 44.9 % (ref 43.0–77.0)
Neutro Abs: 2.7 10*3/uL (ref 1.4–7.7)
Platelets: 300 10*3/uL (ref 150.0–400.0)
RBC: 4.87 Mil/uL (ref 3.87–5.11)
RDW: 13.8 % (ref 11.5–15.5)
WBC: 6 10*3/uL (ref 4.0–10.5)

## 2017-11-05 LAB — HEPATIC FUNCTION PANEL
ALBUMIN: 4 g/dL (ref 3.5–5.2)
ALK PHOS: 81 U/L (ref 39–117)
ALT: 15 U/L (ref 0–35)
AST: 13 U/L (ref 0–37)
BILIRUBIN TOTAL: 0.5 mg/dL (ref 0.2–1.2)
Bilirubin, Direct: 0.1 mg/dL (ref 0.0–0.3)
Total Protein: 7.2 g/dL (ref 6.0–8.3)

## 2017-11-05 LAB — TSH: TSH: 2.24 u[IU]/mL (ref 0.35–4.50)

## 2017-11-05 LAB — LIPID PANEL
CHOL/HDL RATIO: 5
Cholesterol: 227 mg/dL — ABNORMAL HIGH (ref 0–200)
HDL: 49.9 mg/dL (ref >39.00)
LDL Cholesterol: 142 mg/dL — ABNORMAL HIGH (ref 0–99)
NONHDL: 176.68
Triglycerides: 172 mg/dL — ABNORMAL HIGH (ref 0.0–149.0)
VLDL: 34.4 mg/dL (ref 0.0–40.0)

## 2017-11-05 LAB — BASIC METABOLIC PANEL
BUN: 15 mg/dL (ref 6–23)
CALCIUM: 9.6 mg/dL (ref 8.4–10.5)
CO2: 32 meq/L (ref 19–32)
CREATININE: 0.77 mg/dL (ref 0.40–1.20)
Chloride: 104 mEq/L (ref 96–112)
GFR: 78.39 mL/min (ref >60.00)
GLUCOSE: 100 mg/dL — AB (ref 70–99)
Potassium: 4.9 mEq/L (ref 3.5–5.1)
SODIUM: 141 meq/L (ref 135–145)

## 2017-11-05 LAB — HEMOGLOBIN A1C: Hgb A1c MFr Bld: 6.1 % (ref 4.6–6.5)

## 2017-11-05 LAB — VITAMIN D 25 HYDROXY (VIT D DEFICIENCY, FRACTURES): VITD: 30.05 ng/mL (ref 30.00–100.00)

## 2017-11-05 LAB — VITAMIN B12

## 2017-11-05 MED ORDER — MECLIZINE HCL 25 MG PO TABS: 25.0000 mg | ORAL_TABLET | Freq: Three times a day (TID) | ORAL | 3 refills | Status: DC | PRN

## 2017-11-05 MED ORDER — ROSUVASTATIN CALCIUM 10 MG PO TABS: 10.0000 mg | ORAL_TABLET | Freq: Every day | ORAL | 3 refills | Status: DC

## 2017-11-05 NOTE — Patient Instructions (Signed)
Please continue all other medications as before, including the mecliziine, zyrtec and nasacort OTC  You will be contacted regarding the referral for: thyroid ultrasound  Please have the pharmacy call with any other refills you may need.  Please continue your efforts at being more active, low cholesterol diet, and weight control.  You are otherwise up to date with prevention measures today.  Please keep your appointments with your specialists as you may have planned  Please go to the LAB in the Basement (turn left off the elevator) for the tests to be done today  You will be contacted by phone if any changes need to be made immediately.  Otherwise, you will receive a letter about your results with an explanation, but please check with MyChart first.  Please remember to sign up for MyChart if you have not done so, as this will be important to you in the future with finding out test results, communicating by private email, and scheduling acute appointments online when needed.  Please return in 6 months, or sooner if needed

## 2017-11-05 NOTE — Assessment & Plan Note (Signed)
Asympt, stable overall by history and exam, recent data reviewed with pt, and pt to continue medical treatment as before,  to f/u any worsening symptoms or concerns, for f/u lab today

## 2017-11-05 NOTE — Progress Notes (Signed)
Subjective:    Patient ID: Debra Atkinson, female    DOB: Jul 01, 1946, 72 y.o.   MRN: [id number]  HPI   Here for wellness and f/u;  Overall doing ok;  Pt denies Chest pain, worsening SOB, DOE, wheezing, orthopnea, PND, worsening LE edema, palpitations, dizziness or syncope.  Pt denies neurological change such as new headache, facial or extremity weakness.  Pt denies polydipsia, polyuria, or low sugar symptoms. Pt states overall good compliance with treatment and medications, good tolerability, and has been trying to follow appropriate diet.  Pt denies worsening depressive symptoms, suicidal ideation or panic. No fever, night sweats, wt loss, loss of appetite, or other constitutional symptoms.  Pt states good ability with ADL's, has low fall risk, home safety reviewed and adequate, no other significant changes in hearing or vision, and only occasionally active with exercise, afraid to do so when BP is mildly elevated, seems to be up and down at home, mostly down like today.   Vertigo persists with position change, sometime daily for weeks, without ear pain but with occas nausea.  Does have several wks ongoing nasal allergy symptoms with clearish congestion, itch and sneezing, without fever, pain, ST, cough, swelling or wheezing; has zyrtec at home but not currently taking.  Is taking her B12 and Vit D supplements well.  Denies hyper or hypo thyroid symptoms such as voice, skin or hair change - but due for f/u thyroid u/s as well with bordeline nodule dec 2017 Wt Readings from Last 3 Encounters:  11/05/17 191 lb (86.6 kg)  07/23/17 187 lb (84.8 kg)  07/05/17 188 lb (85.3 kg)   BP Readings from Last 3 Encounters:  11/05/17 122/78  07/23/17 (!) 146/92  07/05/17 128/86   Past Medical History:  Diagnosis Date  . Concussion '06, '11  . Diabetes mellitus    diet management  . Family history of colon cancer 02/28/2016  . Fibromyalgia 10/08/2013  . Genital warts    treated podophyllin  .  Hyperlipidemia    diet managed  . Varicella    Past Surgical History:  Procedure Laterality Date  . APPENDECTOMY  1979  . [place]  . CHOLECYSTECTOMY  1979   laparotomy    reports that she has never smoked. She has never used smokeless tobacco. She reports that she does not drink alcohol or use drugs. family history includes COPD in her mother; Cancer (age of onset: 74) in her mother; Diabetes in her maternal grandfather and mother; Heart disease in her father and mother; Rheumatic fever in her father. Allergies  Allergen Reactions  . Prednisone Itching    Redness from injection   Current Outpatient Medications on File Prior to Visit  Medication Sig Dispense Refill  . aspirin 81 MG EC tablet Take 1 tablet (81 mg total) by mouth daily. Swallow whole. 30 tablet 12  . cetirizine (ZYRTEC) 10 MG tablet Take 1 tablet (10 mg total) daily by mouth. 30 tablet 11  . Cholecalciferol (CVS D3) 2000 units CAPS Take by mouth.    . Cyanocobalamin (CVS VITAMIN B-12 PO) Take 2,500 tablets by mouth daily.     [name] ibuprofen (ADVIL,MOTRIN) 200 MG tablet Take 200 mg by mouth every 6 (six) hours as needed.    . lidocaine (LIDODERM) 5 % Place 1 patch onto the skin daily. Remove & Discard patch within 12 hours or as directed by MD 30 patch 0  . tiZANidine (ZANAFLEX) 4 MG tablet Take 1 tablet (4 mg total)  by mouth every 6 (six) hours as needed for muscle spasms. 30 tablet 1  . triamcinolone (NASACORT AQ) 55 MCG/ACT AERO nasal inhaler Place 2 sprays daily into the nose. 1 Inhaler 12   No current facility-administered medications on file prior to visit.    Review of Systems Constitutional: Negative for other unusual diaphoresis, sweats, appetite or weight changes HENT: Negative for other worsening hearing loss, ear pain, facial swelling, mouth sores or neck stiffness.   Eyes: Negative for other worsening pain, redness or other visual disturbance.  Respiratory: Negative for other stridor or  swelling Cardiovascular: Negative for other palpitations or other chest pain  Gastrointestinal: Negative for worsening diarrhea or loose stools, blood in stool, distention or other pain Genitourinary: Negative for hematuria, flank pain or other change in urine volume.  Musculoskeletal: Negative for myalgias or other joint swelling.  Skin: Negative for other color change, or other wound or worsening drainage.  Neurological: Negative for other syncope or numbness. Hematological: Negative for other adenopathy or swelling Psychiatric/Behavioral: Negative for hallucinations, other worsening agitation, SI, self-injury, or new decreased concentration All other system neg per pt    Objective:   Physical Exam BP 122/78   Pulse 81   Temp 98.4 F (36.9 C) (Oral)   Ht 5\' 2"  (2.774 m)   Wt 191 lb (86.6 kg)   SpO2 97%   BMI 34.93 kg/m  VS noted,  Constitutional: Pt is oriented to person, place, and time. Appears well-developed and well-nourished, in no significant distress and comfortable Head: Normocephalic and atraumatic  Eyes: Conjunctivae and EOM are normal. Pupils are equal, round, and reactive to light Right Ear: External ear normal without discharge Left Ear: External ear normal without discharge Nose: Nose without discharge or deformity Bilat tm's with mild erythema.  Max sinus areas non tender.  Pharynx with mild erythema, no exudate Mouth/Throat: Oropharynx is without other ulcerations and moist  Neck: Normal range of motion. Neck supple. No JVD present. No tracheal deviation present or significant neck LA or mass such as enlarging thyroid noted Cardiovascular: Normal rate, regular rhythm, normal heart sounds and intact distal pulses.   Pulmonary/Chest: WOB normal and breath sounds without rales or wheezing  Abdominal: Soft. Bowel sounds are normal. NT. No HSM  Musculoskeletal: Normal range of motion. Exhibits no edema Lymphadenopathy: Has no other cervical adenopathy.  Neurological:  Pt is alert and oriented to person, place, and time. Pt has normal reflexes. No cranial nerve deficit. Motor grossly intact, Gait intact Skin: Skin is warm and dry. No rash noted or new ulcerations Psychiatric:  Has nervous mood and affect. Behavior is normal without agitation No other exam findings  Lab Results  Component Value Date   WBC 7.4 02/13/2017   HGB 13.0 02/13/2017   HCT 39.7 02/13/2017   PLT 344.0 02/13/2017   GLUCOSE 100 (H) 02/13/2017   CHOL 233 (H) 11/01/2016   TRIG 124.0 11/01/2016   HDL 55.10 11/01/2016   LDLDIRECT 163.8 05/18/2013   LDLCALC 153 (H) 11/01/2016   ALT 14 02/13/2017   AST 13 02/13/2017   NA 140 02/13/2017   K 4.0 02/13/2017   CL 105 02/13/2017   CREATININE 0.76 02/13/2017   BUN 21 02/13/2017   CO2 28 02/13/2017   TSH 2.64 02/13/2017   INR 0.92 05/24/2011   HGBA1C 6.1 02/14/2017   MICROALBUR 0.9 10/26/2015      Assessment & Plan:

## 2017-11-05 NOTE — Assessment & Plan Note (Signed)

## 2017-11-05 NOTE — Assessment & Plan Note (Signed)
To cont supplement, for f/u lab today

## 2017-11-05 NOTE — Assessment & Plan Note (Addendum)
Asympt, due for thyroid u/s f/u,  to f/u any worsening symptoms or concerns, also for TFTs today  In addition to the time spent performing CPE, I spent an additional 25 minutes face to face,in which greater than 50% of this time was spent in counseling and coordination of care for patient's acute illness as documented, including the differential dx, treatment, further evaluation and other management of thyroid nodule, Vit d and b12 deficiency, hypeglycemia, allergic rhinitis, and vertigo

## 2017-11-05 NOTE — Assessment & Plan Note (Signed)
Also to cont oral supplement, for b12 lab today

## 2017-11-05 NOTE — Addendum Note (Signed)
Addended by: Biagio Borg on: 11/05/2017 12:57 PM   Modules accepted: Orders

## 2017-11-05 NOTE — Assessment & Plan Note (Signed)
Mild to mod, for restart zyrtec asd,  to f/u any worsening symptoms or concerns 

## 2017-11-05 NOTE — Assessment & Plan Note (Signed)
Ok for meclizine refill,  to f/u any worsening symptoms or concerns 

## 2017-11-06 ENCOUNTER — Telehealth: Payer: Self-pay

## 2017-11-06 NOTE — Telephone Encounter (Signed)
Pt has been informed and expressed understanding.  

## 2017-11-06 NOTE — Telephone Encounter (Signed)
-----   Message from Biagio Borg, MD sent at 11/05/2017  7:00 PM EDT ----- Left message on MyChart, pt to cont same tx except  The test results show that your current treatment is OK, except the LDL cholesterol is still moderately elevated.  Please start generic crestor 10 mg per day to help reduce risk of heart disease and stroke in the future  Stephone Gum to please inform pt, I will do rx

## 2017-11-06 NOTE — Telephone Encounter (Signed)
Pt has been informed and expressed understanding but she wants to know what should she do about the high B12 level since she takes b12 tablets. Please advise.

## 2017-11-06 NOTE — Telephone Encounter (Signed)
Ok to continue as she does as there is little to no chance of side effect from taking b12 oral daily

## 2017-11-12 ENCOUNTER — Telehealth: Payer: Self-pay | Admitting: Internal Medicine

## 2017-11-12 NOTE — Telephone Encounter (Signed)
ROI faxed to [facility]

## 2017-11-14 ENCOUNTER — Ambulatory Visit
Admission: RE | Admit: 2017-11-14 | Discharge: 2017-11-14 | Disposition: A | Discharge status: Home / Self Care | Payer: Medicare HMO | Source: Ambulatory Visit | Attending: Internal Medicine | Admitting: Internal Medicine

## 2017-11-14 DIAGNOSIS — E041 Nontoxic single thyroid nodule: Secondary | ICD-10-CM | POA: Diagnosis not present

## 2017-11-18 ENCOUNTER — Telehealth: Payer: Self-pay | Admitting: Internal Medicine

## 2017-11-18 MED ORDER — MECLIZINE HCL 25 MG PO TABS: 25.0000 mg | ORAL_TABLET | Freq: Three times a day (TID) | ORAL | 3 refills | Status: DC | PRN

## 2017-11-18 NOTE — Telephone Encounter (Signed)
I sent a refill of the antivert, hopefully this will help

## 2017-11-18 NOTE — Telephone Encounter (Signed)
Copied from [place] [phone]. Topic: Quick Communication - See Telephone Encounter >> Nov 18, 2017  2:58 PM Percell Belt A wrote: CRM for notification. See Telephone encounter for: 11/18/17. Pt called in and said that she has vertigo and has had it for a week. She has been taking otc meds but not helping.  She stated that it is making her feel sick.  She would like to know what she can do?  Please advise  She would also like the results of her ultrasound  Best number [phone]

## 2017-11-19 MED ORDER — DIAZEPAM 5 MG PO TABS: 5.0000 mg | ORAL_TABLET | Freq: Two times a day (BID) | ORAL | 0 refills | Status: DC | PRN

## 2017-11-19 NOTE — Telephone Encounter (Signed)
Pt has been informed and expressed understanding.  

## 2017-11-19 NOTE — Telephone Encounter (Signed)
Ok to try valium 5 mg bid for 5 days, as this helps in most people as well - done erx

## 2017-11-19 NOTE — Addendum Note (Signed)
Addended by: Biagio Borg on: 11/19/2017 11:03 AM   Modules accepted: Orders

## 2017-11-19 NOTE — Telephone Encounter (Signed)
Pt stated that she has been taking the antivert but she said that it seems to make it worse. She would like to know other options. Please advise.

## 2017-11-22 ENCOUNTER — Ambulatory Visit: Payer: Self-pay | Admitting: *Deleted

## 2017-11-22 ENCOUNTER — Telehealth: Payer: Self-pay | Admitting: Internal Medicine

## 2017-11-22 NOTE — Telephone Encounter (Signed)
Copied from [place] [id number]. Topic: Quick Communication - See Telephone Encounter >> Nov 22, 2017  8:40 AM Vernona Rieger wrote: CRM for notification. See Telephone encounter for: 11/22/17.  Patient is requesting her results from her thyroid biopsy she had on 5/16. Call back @ [phone]

## 2017-11-22 NOTE — Telephone Encounter (Signed)
See notes below regarding the meclizine and Valium.    She is wondering if it is ok to take the meclizine during the day and use the valium at night when she is home.   She is still having the dizziness.  "But I have to go about my business".      She also inquired about her ultrasound results done last week. Reason for Disposition . Caller has NON-URGENT medication question about med that PCP prescribed and triager unable to answer question  Answer Assessment - Initial Assessment Questions 1. SYMPTOMS: "Do you have any symptoms?"     Can I take the meclizine 3 times a day?    I've had 3 concussions in the last 3 years.    I'm still dizzy with the meclizine.   He changed me to Valium to help with the dizziness since the meclizine wasn't working.   I have not started the Valium yet.   I just picked it up yesterday.   I'm wondering if I can take the meclizine during the day and take the valium  at night if that is ok? 2. SEVERITY: If symptoms are present, ask "Are they mild, moderate or severe?"     I'm still dizzy.  Protocols used: MEDICATION QUESTION CALL-A-AH   While I was on the phone with her she mentioned her legs are hurting.  The  Left leg pain started last night.   The upper thigh to the knee.   Denies falling, injury or bumps to her left leg.    No bruising or swelling.    Does not hurt to walk.   The pain went away when she went to bed last night.  Now this morning both legs are aching.    This is a new pain for me.  Denies numbness or tingling in either leg.   She was vague in her descriptions of her leg pain.   She mentioned,  "I wonder if it's from sitting more than usual due to the dizziness".   She has not tried any Tylenol or Advil.   I asked if she wanted an appt or did she want to see if moving around would help.    She wanted to go ahead and make an appt and if her legs get better she will call and cancel.

## 2017-11-22 NOTE — Telephone Encounter (Signed)
Do you have these results

## 2017-11-28 ENCOUNTER — Ambulatory Visit (INDEPENDENT_AMBULATORY_CARE_PROVIDER_SITE_OTHER): Payer: Medicare HMO | Admitting: Internal Medicine

## 2017-11-28 ENCOUNTER — Encounter: Payer: Self-pay | Admitting: Internal Medicine

## 2017-11-28 VITALS — BP 134/82 | HR 93 | Temp 98.4°F | Ht 62.0 in | Wt 194.0 lb

## 2017-11-28 DIAGNOSIS — H6981 Other specified disorders of Eustachian tube, right ear: Secondary | ICD-10-CM | POA: Diagnosis not present

## 2017-11-28 DIAGNOSIS — R42 Dizziness and giddiness: Secondary | ICD-10-CM | POA: Diagnosis not present

## 2017-11-28 DIAGNOSIS — J309 Allergic rhinitis, unspecified: Secondary | ICD-10-CM

## 2017-11-28 DIAGNOSIS — M545 Low back pain, unspecified: Secondary | ICD-10-CM

## 2017-11-28 DIAGNOSIS — H6591 Unspecified nonsuppurative otitis media, right ear: Secondary | ICD-10-CM | POA: Insufficient documentation

## 2017-11-28 DIAGNOSIS — E041 Nontoxic single thyroid nodule: Secondary | ICD-10-CM

## 2017-11-28 DIAGNOSIS — G8929 Other chronic pain: Secondary | ICD-10-CM | POA: Diagnosis not present

## 2017-11-28 DIAGNOSIS — M79605 Pain in left leg: Secondary | ICD-10-CM

## 2017-11-28 DIAGNOSIS — M79604 Pain in right leg: Secondary | ICD-10-CM | POA: Insufficient documentation

## 2017-11-28 MED ORDER — TRIAMCINOLONE ACETONIDE 55 MCG/ACT NA AERO: 2.0000 | INHALATION_SPRAY | Freq: Every day | NASAL | 5 refills | Status: DC

## 2017-11-28 MED ORDER — TRIAMCINOLONE ACETONIDE 55 MCG/ACT NA AERO: 2.0000 | INHALATION_SPRAY | Freq: Every day | NASAL | 3 refills | Status: DC

## 2017-11-28 NOTE — Progress Notes (Signed)
Subjective:    Patient ID: Debra Atkinson, female    DOB: May 25, 1946, 72 y.o.   MRN: [id number]  HPI  Here with c/o bilat leg pain, c/o left upper then right upper leg , wonders if aggrevated by the clutch in the vehicle.  Pt continues to have recurring LBP but no bowel or bladder change, fever, wt loss,  worsening LE numbness/weakness, gait change or falls. Seemed to start about 3 days ago, not clear to pt the pain is radicular and denies numbness or specific weakness to one leg or the other,   Also with lower abd pain with green stool then pain resolved, second time this has occurred but last episode "a while back"  Denies worsening reflux, dysphagia, n/v, bowel change or blood/melana.. Also c/o dizziness with nausea but no vomiting, worse to lie down and sit up, asopc with right ear fullness, buzzing and pain, but most of the symptoms seem to start "in the back of the head" and wondering if her 3 concussions in the past are somehow related.  No problem driving, but for some reason walking makes worse.  Had quit taking meclizine a day or so ago , zyrtec may have helped a little yesterday.   Pt denies fever, wt loss, night sweats, loss of appetite, or other constitutional symptoms  Has not been using hte nasacort recently as well, asks for refill.  Plans for trip to [place] but wil drive instead of flying due to the dizziness.  She is concerned bc her son that died had had pain in his head due to cns vascular dz.   Has not yet tried the valium   No other new complaint or interval hx Past Medical History:  Diagnosis Date  . Concussion '06, '11  . Diabetes mellitus    diet management  . Family history of colon cancer 02/28/2016  . Fibromyalgia 10/08/2013  . Genital warts    treated podophyllin  . Hyperlipidemia    diet managed  . Varicella    Past Surgical History:  Procedure Laterality Date  . APPENDECTOMY  1979  . [place]  . CHOLECYSTECTOMY  1979   laparotomy    reports that she has never smoked. She has never used smokeless tobacco. She reports that she does not drink alcohol or use drugs. family history includes COPD in her mother; Cancer (age of onset: 38) in her mother; Diabetes in her maternal grandfather and mother; Heart disease in her father and mother; Rheumatic fever in her father. Allergies  Allergen Reactions  . Prednisone Itching    Redness from injection   Current Outpatient Medications on File Prior to Visit  Medication Sig Dispense Refill  . cetirizine (ZYRTEC) 10 MG tablet Take 1 tablet (10 mg total) daily by mouth. 30 tablet 11  . Cholecalciferol ([employer] D3) 2000 units CAPS Take by mouth.    . Cyanocobalamin ([employer]S VITAMIN B-12 PO) Take 2,500 tablets by mouth daily.     . diazepam (VALIUM) 5 MG tablet Take 1 tablet (5 mg total) by mouth every 12 (twelve) hours as needed for anxiety. 10 tablet 0  . ibuprofen (ADVIL,MOTRIN) 200 MG tablet Take 200 mg by mouth every 6 (six) hours as needed.    . meclizine (ANTIVERT) 25 MG tablet Take 1 tablet (25 mg total) by mouth 3 (three) times daily as needed for dizziness. 60 tablet 3  . tiZANidine (ZANAFLEX) 4 MG tablet Take 1 tablet (4 mg total) by mouth every  6 (six) hours as needed for muscle spasms. 30 tablet 1   No current facility-administered medications on file prior to visit.    Review of Systems  Constitutional: Negative for other unusual diaphoresis or sweats HENT: Negative for ear discharge or swelling Eyes: Negative for other worsening visual disturbances Respiratory: Negative for stridor or other swelling  Gastrointestinal: Negative for worsening distension or other blood Genitourinary: Negative for retention or other urinary change Musculoskeletal: Negative for other MSK pain or swelling Skin: Negative for color change or other new lesions Neurological: Negative for worsening tremors and other numbness  Psychiatric/Behavioral: Negative for worsening agitation or other fatigue All  other system neg per pt    Objective:   Physical Exam BP 134/82   Pulse 93   Temp 98.4 F (36.9 C) (Oral)   Ht 5\' 2"  (1.575 m)   Wt 194 lb (88 kg)   SpO2 96%   BMI 35.48 kg/m  VS noted, non toxic Constitutional: Pt appears in NAD HENT: Head: NCAT.  Right Ear: External ear normal.  Left Ear: External ear normal.  Left tm's with mild erythema but right TM marked erythema with effusion,  Max sinus areas non tender.  Pharynx with mild erythema, no exudate Eyes: . Pupils are equal, round, and reactive to light. Conjunctivae and EOM are normal Nose: without d/c or deformity Neck: Neck supple. Gross normal ROM Cardiovascular: Normal rate and regular rhythm.   Pulmonary/Chest: Effort normal and breath sounds without rales or wheezing.  Abd:  Soft, NT, ND, + BS, no organomegaly Neurological: Pt is alert. At baseline orientation, motor grossly intact Skin: Skin is warm. No rashes, other new lesions, no LE edema Psychiatric: Pt behavior is normal without agitation but with 2+ anxiety No other exam findings    Assessment & Plan:

## 2017-11-28 NOTE — Patient Instructions (Addendum)
Please continue all other medications as before, including the nasacort, meclizine, valium  You can also take Mucinex (or it's generic off brand) for congestion, and tylenol as needed for pain.  Please have the pharmacy call with any other refills you may need.  Please continue your efforts at being more active, low cholesterol diet, and weight control.  You are otherwise up to date with prevention measures today.  Please keep your appointments with your specialists as you may have planned  You will be contacted regarding the referral for: Physical Therapy - vestibular rehab

## 2017-11-28 NOTE — Assessment & Plan Note (Signed)
stable overall by history and exam, recent data reviewed with pt - the ultrasound, and pt to continue medical treatment as before,  to f/u any worsening symptoms or concerns

## 2017-11-30 NOTE — Assessment & Plan Note (Signed)
For mucinex otc prn,  to f/u any worsening symptoms or concerns  

## 2017-11-30 NOTE — Assessment & Plan Note (Signed)
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

## 2017-11-30 NOTE — Assessment & Plan Note (Addendum)
Likely vestibular, for antivert prn, refer vestibular rehab

## 2017-11-30 NOTE — Assessment & Plan Note (Signed)
Exam benign, no vascular or neuritic pain, likely msk,  to f/u any worsening symptoms or concerns

## 2017-11-30 NOTE — Assessment & Plan Note (Addendum)
Mild to mod, for mucinex prn, to f/u any worsening symptoms or concerns  Note:  Total time for pt hx, exam, review of record with pt in the room, determination of diagnoses and plan for further eval and tx is > 40 min, with over 50% spent in coordination and counseling of patient including the differential dx, tx, further evaluation and other management of right serous otitis media, vertigo, eustachian valve dysfxn, LBP, allergic rhinitis and thyroid nodule

## 2017-11-30 NOTE — Assessment & Plan Note (Signed)
Ok to restart nasonex

## 2018-01-16 ENCOUNTER — Encounter (HOSPITAL_COMMUNITY): Payer: Self-pay

## 2018-01-16 ENCOUNTER — Emergency Department (HOSPITAL_COMMUNITY)
Admission: EM | Admit: 2018-01-16 | Discharge: 2018-01-16 | Disposition: A | Discharge status: Home / Self Care | Payer: Medicare HMO | Attending: Emergency Medicine | Admitting: Emergency Medicine

## 2018-01-16 DIAGNOSIS — E119 Type 2 diabetes mellitus without complications: Secondary | ICD-10-CM | POA: Insufficient documentation

## 2018-01-16 DIAGNOSIS — M795 Residual foreign body in soft tissue: Secondary | ICD-10-CM | POA: Diagnosis not present

## 2018-01-16 DIAGNOSIS — S60451A Superficial foreign body of left index finger, initial encounter: Secondary | ICD-10-CM | POA: Diagnosis not present

## 2018-01-16 DIAGNOSIS — Z79899 Other long term (current) drug therapy: Secondary | ICD-10-CM | POA: Diagnosis not present

## 2018-01-16 MED ORDER — LIDOCAINE HCL 2 % IJ SOLN
20.0000 mL | Freq: Once | INTRAMUSCULAR | Status: AC
  Administered 2018-01-16: 400 mg
  Filled 2018-01-16: qty 20

## 2018-01-16 NOTE — ED Notes (Signed)
ED Provider at bedside. 

## 2018-01-16 NOTE — ED Provider Notes (Signed)
[facility] EMERGENCY DEPARTMENT Provider Note   CSN: [id number] Arrival date & time: 01/16/18  0015     History   Chief Complaint Chief Complaint  Patient presents with  . Foreign Body in Skin    HPI Debra Atkinson is a 72 y.o. female.  HPI Patient presents to the emergency department after getting a splinter under her left index finger nail.  That she was closing some blinds when she was lowering them and then there is a piece of small wood on the windowsill and it got stuck under her fingernail.  She states she tried to soak it and get it out but was unable to do so.  Patient states that she did not take any medications prior to arrival for her symptoms.  Patient denies any numbness or weakness in her finger. Past Medical History:  Diagnosis Date  . Concussion '06, '11  . Diabetes mellitus    diet management  . Family history of colon cancer 02/28/2016  . Fibromyalgia 10/08/2013  . Genital warts    treated podophyllin  . Hyperlipidemia    diet managed  . Varicella     Patient Active Problem List   Diagnosis Date Noted  . Right serous otitis media 11/28/2017  . Vertigo 11/28/2017  . Bilateral leg pain 11/28/2017  . Dysuria 07/06/2017  . B12 deficiency 05/07/2017  . Vitamin D deficiency 05/07/2017  . Fatigue 02/13/2017  . Lateral epicondylitis of left elbow 10/31/2016  . Toe pain, left 10/31/2016  . Posterior neck pain 10/31/2016  . 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 Thyroid nodule 06/27/2016  . Pain in both knees 06/27/2016  . Mass of right side of neck 04/29/2016  . Dizziness 03/07/2016  . Family history of colon cancer 02/28/2016  . Constipation 02/28/2016  . UTI (urinary tract infection) 07/29/2015  . Cough 05/05/2015  . BPV (benign positional vertigo) 11/09/2014  . Atypical chest pain 10/08/2014  . Upper airway cough syndrome 06/08/2014  . Sinusitis, chronic 04/09/2014  . Allergic conjunctivitis 04/09/2014  .  Urinary frequency 02/18/2014  . Eustachian tube dysfunction 12/04/2013  . Fibromyalgia 10/08/2013  . Low back pain 09/18/2012  . Allergic rhinitis 09/18/2012  . Need for prophylactic vaccination and inoculation against influenza 04/28/2012  . Encounter for preventative adult health care exam with abnormal findings 04/28/2012  . Pre-diabetes 04/25/2011  . Hyperlipidemia     Past Surgical History:  Procedure Laterality Date  . APPENDECTOMY  1979  . [place]  . CHOLECYSTECTOMY  1979   laparotomy     OB History   None      Home Medications    Prior to Admission medications   Medication Sig Start Date End Date Taking? Authorizing Provider  cetirizine (ZYRTEC) 10 MG tablet Take 1 tablet (10 mg total) daily by mouth. 05/07/17 05/07/18  Biagio Borg, MD  Cholecalciferol (CVS D3) 2000 units CAPS Take by mouth.    [provider]  Cyanocobalamin (CVS VITAMIN B-12 PO) Take 2,500 tablets by mouth daily.     [provider]  diazepam (VALIUM) 5 MG tablet Take 1 tablet (5 mg total) by mouth every 12 (twelve) hours as needed for anxiety. 11/19/17   Biagio Borg, MD  ibuprofen (ADVIL,MOTRIN) 200 MG tablet Take 200 mg by mouth every 6 (six) hours as needed.    [provider]  meclizine (ANTIVERT) 25 MG tablet Take 1 tablet (25 mg total) by mouth 3 (three) times  daily as needed for dizziness. 11/18/17   Biagio Borg, MD  tiZANidine (ZANAFLEX) 4 MG tablet Take 1 tablet (4 mg total) by mouth every 6 (six) hours as needed for muscle spasms. 10/31/16   Biagio Borg, MD  triamcinolone (NASACORT AQ) 55 MCG/ACT AERO nasal inhaler Place 2 sprays into the nose daily. 11/28/17   Biagio Borg, MD    Family History Family History  Problem Relation Age of Onset  . Diabetes Mother   . Heart disease Mother        CHF  . Cancer Mother 61       colon cancer  . COPD Mother   . Heart disease Father        CAD/MI  . Rheumatic fever Father   . Diabetes Maternal  Grandfather     Social History Social History   Tobacco Use  . Smoking status: Never Smoker  . Smokeless tobacco: Never Used  Substance Use Topics  . Alcohol use: No    Alcohol/week: 0.0 oz  . Drug use: No     Allergies   Prednisone   Review of Systems Review of Systems All other systems negative except as documented in the HPI. All pertinent positives and negatives as reviewed in the HPI.  Physical Exam Updated Vital Signs BP 133/64 (BP Location: Right Arm)   Pulse 65   Temp 98.2 F (36.8 C) (Oral)   Resp 20   SpO2 98%   Physical Exam  Constitutional: She is oriented to person, place, and time. She appears well-developed and well-nourished. No distress.  HENT:  Head: Normocephalic and atraumatic.  Eyes: Pupils are equal, round, and reactive to light.  Pulmonary/Chest: Effort normal.  Musculoskeletal:       Hands: Neurological: She is alert and oriented to person, place, and time.  Skin: Skin is warm and dry.  Psychiatric: She has a normal mood and affect.  Nursing note and vitals reviewed.    ED Treatments / Results  Labs (all labs ordered are listed, but only abnormal results are displayed) Labs Reviewed - No data to display  EKG None  Radiology No results found.  Procedures .Foreign Body Removal Date/Time: 01/16/2018 7:18 AM Performed by: Dalia Heading, PA-C Authorized by: Dalia Heading, PA-C  Consent: Verbal consent obtained. Risks and benefits: risks, benefits and alternatives were discussed Consent given by: patient Patient understanding: patient states understanding of the procedure being performed Body area: skin General location: upper extremity Location details: left index finger Anesthesia: digital block  Anesthesia: Local Anesthetic: lidocaine 2% without epinephrine Anesthetic total: 6 mL  Sedation: Patient sedated: no  Patient restrained: no Patient cooperative: yes Localization method: visualized Removal  mechanism: hemostat Tendon involvement: none Complexity: complex 1 objects recovered. Objects recovered: splinter Post-procedure assessment: foreign body removed Patient tolerance: Patient tolerated the procedure well with no immediate complications   (including critical care time)  Medications Ordered in ED Medications  lidocaine (XYLOCAINE) 2 % (with pres) injection 400 mg (400 mg Infiltration Given 01/16/18 0650)     Initial Impression / Assessment and Plan / ED Course  I have reviewed the triage vital signs and the nursing notes.  Pertinent labs & imaging results that were available during my care of the patient were reviewed by me and considered in my medical decision making (see chart for details).     Patient is advised to keep the area clean and dry.  Told to follow-up with her primary doctor.  Patient agrees the plan  and all questions were answered.  Final Clinical Impressions(s) / ED Diagnoses   Final diagnoses:  None    ED Discharge Orders    None       Dalia Heading, PA-C 01/16/18 0719    Carmin Muskrat, MD 01/17/18 [phone]

## 2018-01-16 NOTE — Discharge Instructions (Addendum)
Follow-up with your doctor as needed.  Your tetanus shot was in 2013.  For this we do not need to update it.  Keep the area clean and dry

## 2018-01-16 NOTE — ED Triage Notes (Signed)
Pt states that she was shutting a window and got a splinter under her L index finger

## 2018-01-22 ENCOUNTER — Ambulatory Visit (INDEPENDENT_AMBULATORY_CARE_PROVIDER_SITE_OTHER): Payer: Medicare HMO | Admitting: Internal Medicine

## 2018-01-22 ENCOUNTER — Encounter: Payer: Self-pay | Admitting: Internal Medicine

## 2018-01-22 DIAGNOSIS — T148XXA Other injury of unspecified body region, initial encounter: Secondary | ICD-10-CM

## 2018-01-22 NOTE — Assessment & Plan Note (Signed)
L index nail paint chip - s/p removal in the ER Doing well

## 2018-01-22 NOTE — Progress Notes (Signed)
Subjective:  Patient ID: Debra Atkinson, female    DOB: [birth year]  Age: 72 y.o. MRN: [id number]  CC: No chief complaint on file.   HPI Debra Atkinson presents for L index nail paint chip - s/p removal. No pain  Outpatient Medications Prior to Visit  Medication Sig Dispense Refill  . cetirizine (ZYRTEC) 10 MG tablet Take 1 tablet (10 mg total) daily by mouth. 30 tablet 11  . Cholecalciferol (CVS D3) 2000 units CAPS Take by mouth.    . Cyanocobalamin (CVS VITAMIN B-12 PO) Take 2,500 tablets by mouth daily.     . diazepam (VALIUM) 5 MG tablet Take 1 tablet (5 mg total) by mouth every 12 (twelve) hours as needed for anxiety. 10 tablet 0  . ibuprofen (ADVIL,MOTRIN) 200 MG tablet Take 200 mg by mouth every 6 (six) hours as needed.    . meclizine (ANTIVERT) 25 MG tablet Take 1 tablet (25 mg total) by mouth 3 (three) times daily as needed for dizziness. 60 tablet 3  . tiZANidine (ZANAFLEX) 4 MG tablet Take 1 tablet (4 mg total) by mouth every 6 (six) hours as needed for muscle spasms. 30 tablet 1  . triamcinolone (NASACORT AQ) 55 MCG/ACT AERO nasal inhaler Place 2 sprays into the nose daily. 3 Inhaler 3   No facility-administered medications prior to visit.     ROS: Review of Systems  Constitutional: Negative for activity change, appetite change, chills, fatigue, fever and unexpected weight change.  HENT: Negative for congestion, mouth sores and sinus pressure.   Eyes: Negative for visual disturbance.  Respiratory: Negative for cough and chest tightness.   Gastrointestinal: Negative for abdominal pain and nausea.  Genitourinary: Negative for difficulty urinating, frequency and vaginal pain.  Musculoskeletal: Negative for back pain and gait problem.  Skin: Negative for pallor and rash.  Neurological: Negative for dizziness, tremors, weakness, numbness and headaches.  Psychiatric/Behavioral: Negative for confusion and sleep disturbance.    Objective:  BP 126/78 (BP Location: Left  Arm, Patient Position: Sitting, Cuff Size: Large)   Pulse 82   Temp 98.3 F (36.8 C) (Oral)   Ht 5\' 2"  (1.575 m)   Wt 203 lb (92.1 kg)   SpO2 97%   BMI 37.13 kg/m   BP Readings from Last 3 Encounters:  01/22/18 126/78  01/16/18 (!) 142/72  11/28/17 134/82    Wt Readings from Last 3 Encounters:  01/22/18 203 lb (92.1 kg)  11/28/17 194 lb (88 kg)  11/05/17 191 lb (86.6 kg)    Physical Exam  Constitutional: She appears well-developed and well-nourished. No distress.    R index finger nail is s/p splinter removal - clean splinter passage, nail NT  FTF >15 min discussing finger care and discussing potential poison ivy exposure No rash  Lab Results  Component Value Date   WBC 6.0 11/05/2017   HGB 13.6 11/05/2017   HCT 40.3 11/05/2017   PLT 300.0 11/05/2017   GLUCOSE 100 (H) 11/05/2017   CHOL 227 (H) 11/05/2017   TRIG 172.0 (H) 11/05/2017   HDL 49.90 11/05/2017   LDLDIRECT 163.8 05/18/2013   LDLCALC 142 (H) 11/05/2017   ALT 15 11/05/2017   AST 13 11/05/2017   NA 141 11/05/2017   K 4.9 11/05/2017   CL 104 11/05/2017   CREATININE 0.77 11/05/2017   BUN 15 11/05/2017   CO2 32 11/05/2017   TSH 2.24 11/05/2017   INR 0.92 05/24/2011   HGBA1C 6.1 11/05/2017   MICROALBUR 0.9 10/26/2015  No results found.  Assessment & Plan:   There are no diagnoses linked to this encounter.   No orders of the defined types were placed in this encounter.    Follow-up: No follow-ups on file.  Walker Kehr, MD

## 2018-02-24 ENCOUNTER — Encounter: Payer: Self-pay | Admitting: Internal Medicine

## 2018-02-24 ENCOUNTER — Ambulatory Visit (INDEPENDENT_AMBULATORY_CARE_PROVIDER_SITE_OTHER): Payer: Medicare HMO | Admitting: Internal Medicine

## 2018-02-24 VITALS — BP 136/86 | HR 78 | Temp 98.3°F | Ht 62.0 in | Wt 206.0 lb

## 2018-02-24 DIAGNOSIS — J029 Acute pharyngitis, unspecified: Secondary | ICD-10-CM

## 2018-02-24 DIAGNOSIS — J309 Allergic rhinitis, unspecified: Secondary | ICD-10-CM | POA: Diagnosis not present

## 2018-02-24 DIAGNOSIS — R7303 Prediabetes: Secondary | ICD-10-CM

## 2018-02-24 MED ORDER — AZITHROMYCIN 250 MG PO TABS: ORAL_TABLET | ORAL | 1 refills | Status: DC

## 2018-02-24 NOTE — Patient Instructions (Signed)
Please take all new medication as prescribed - the antibiotic  You can also take Delsym OTC for cough, and/or Mucinex (or it's generic off brand) for congestion, and tylenol as needed for pain.  Please continue all other medications as before, and refills have been done if requested.  Please have the pharmacy call with any other refills you may need.  Please keep your appointments with your specialists as you may have planned    

## 2018-02-24 NOTE — Assessment & Plan Note (Signed)
stable overall by history and exam, recent data reviewed with pt, and pt to continue medical treatment as before,  to f/u any worsening symptoms or concerns Lab Results  Component Value Date   HGBA1C 6.1 11/05/2017

## 2018-02-24 NOTE — Progress Notes (Signed)
Subjective:    Patient ID: Debra Atkinson, female    DOB: 07-Aug-1945, 72 y.o.   MRN: [id number]  HPI    Here with 2-3 days acute onset fever, severe ST pain, pressure, headache, general weakness and malaise, with mild non prod cough, but pt denies chest pain, wheezing, increased sob or doe, orthopnea, PND, increased LE swelling, palpitations, dizziness or syncope.  Pt denies new neurological symptoms such as new headache, or facial or extremity weakness or numbness   Pt denies polydipsia, polyuria.  Allergies remain controlled and denie nasal allergy symptoms  Past Medical History:  Diagnosis Date  . Concussion '06, '11  . Diabetes mellitus    diet management  . Family history of colon cancer 02/28/2016  . Fibromyalgia 10/08/2013  . Genital warts    treated podophyllin  . Hyperlipidemia    diet managed  . Varicella    Past Surgical History:  Procedure Laterality Date  . APPENDECTOMY  1979  . [place]  . CHOLECYSTECTOMY  1979   laparotomy    reports that she has never smoked. She has never used smokeless tobacco. She reports that she does not drink alcohol or use drugs. family history includes COPD in her mother; Cancer (age of onset: 92) in her mother; Diabetes in her maternal grandfather and mother; Heart disease in her father and mother; Rheumatic fever in her father. Allergies  Allergen Reactions  . Prednisone Itching    Redness from injection   Current Outpatient Medications on File Prior to Visit  Medication Sig Dispense Refill  . cetirizine (ZYRTEC) 10 MG tablet Take 1 tablet (10 mg total) daily by mouth. 30 tablet 11  . Cholecalciferol (CVS D3) 2000 units CAPS Take by mouth.    . Cyanocobalamin (CVS VITAMIN B-12 PO) Take 2,500 tablets by mouth daily.     . diazepam (VALIUM) 5 MG tablet Take 1 tablet (5 mg total) by mouth every 12 (twelve) hours as needed for anxiety. 10 tablet 0  . ibuprofen (ADVIL,MOTRIN) 200 MG tablet Take 200 mg by mouth every 6 (six)  hours as needed.    . meclizine (ANTIVERT) 25 MG tablet Take 1 tablet (25 mg total) by mouth 3 (three) times daily as needed for dizziness. 60 tablet 3  . tiZANidine (ZANAFLEX) 4 MG tablet Take 1 tablet (4 mg total) by mouth every 6 (six) hours as needed for muscle spasms. 30 tablet 1  . triamcinolone (NASACORT AQ) 55 MCG/ACT AERO nasal inhaler Place 2 sprays into the nose daily. 3 Inhaler 3   No current facility-administered medications on file prior to visit.    Review of Systems  Constitutional: Negative for other unusual diaphoresis or sweats HENT: Negative for ear discharge or swelling Eyes: Negative for other worsening visual disturbances Respiratory: Negative for stridor or other swelling  Gastrointestinal: Negative for worsening distension or other blood Genitourinary: Negative for retention or other urinary change Musculoskeletal: Negative for other MSK pain or swelling Skin: Negative for color change or other new lesions Neurological: Negative for worsening tremors and other numbness  Psychiatric/Behavioral: Negative for worsening agitation or other fatigue All other system neg per pt    Objective:   Physical Exam BP 136/86   Pulse 78   Temp 98.3 F (36.8 C) (Oral)   Ht 5\' 2"  (1.575 m)   Wt 206 lb (93.4 kg)   SpO2 98%   BMI 37.68 kg/m  VS noted,  Mild ill Constitutional: Pt appears in NAD HENT: Head:  NCAT.  Right Ear: External ear normal.  Left Ear: External ear normal.  Bilat tm's with mild erythema.  Max sinus areas non tender.  Pharynx with severe erythema, with small left pharyngeal exudate Eyes: . Pupils are equal, round, and reactive to light. Conjunctivae and EOM are normal Nose: without d/c or deformity Neck: Neck supple. Gross normal ROM Cardiovascular: Normal rate and regular rhythm.   Pulmonary/Chest: Effort normal and breath sounds without rales or wheezing.  Neurological: Pt is alert. At baseline orientation, motor grossly intact Skin: Skin is warm.  No rashes, other new lesions, no LE edema Psychiatric: Pt behavior is normal without agitation  No other exam findings     Assessment & Plan:

## 2018-02-24 NOTE — Assessment & Plan Note (Signed)
Mild to mod, for antibx course,  to f/u any worsening symptoms or concerns 

## 2018-02-24 NOTE — Assessment & Plan Note (Signed)
stable overall by history and exam, recent data reviewed with pt, and pt to continue medical treatment as before,  to f/u any worsening symptoms or concerns  

## 2018-03-05 DIAGNOSIS — R152 Fecal urgency: Secondary | ICD-10-CM | POA: Diagnosis not present

## 2018-03-05 DIAGNOSIS — Z01419 Encounter for gynecological examination (general) (routine) without abnormal findings: Secondary | ICD-10-CM | POA: Diagnosis not present

## 2018-03-05 DIAGNOSIS — N3941 Urge incontinence: Secondary | ICD-10-CM | POA: Diagnosis not present

## 2018-03-05 DIAGNOSIS — Z6837 Body mass index (BMI) 37.0-37.9, adult: Secondary | ICD-10-CM | POA: Diagnosis not present

## 2018-04-22 ENCOUNTER — Encounter: Payer: Self-pay | Admitting: Internal Medicine

## 2018-04-29 DIAGNOSIS — Z803 Family history of malignant neoplasm of breast: Secondary | ICD-10-CM | POA: Diagnosis not present

## 2018-04-29 DIAGNOSIS — Z1231 Encounter for screening mammogram for malignant neoplasm of breast: Secondary | ICD-10-CM | POA: Diagnosis not present

## 2018-04-29 LAB — HM MAMMOGRAPHY

## 2018-05-08 ENCOUNTER — Ambulatory Visit (INDEPENDENT_AMBULATORY_CARE_PROVIDER_SITE_OTHER)
Admission: RE | Admit: 2018-05-08 | Discharge: 2018-05-08 | Disposition: A | Discharge status: Home / Self Care | Payer: Medicare HMO | Source: Ambulatory Visit | Attending: Internal Medicine | Admitting: Internal Medicine

## 2018-05-08 ENCOUNTER — Other Ambulatory Visit (INDEPENDENT_AMBULATORY_CARE_PROVIDER_SITE_OTHER): Payer: Medicare HMO

## 2018-05-08 ENCOUNTER — Ambulatory Visit (INDEPENDENT_AMBULATORY_CARE_PROVIDER_SITE_OTHER): Payer: Medicare HMO | Admitting: Internal Medicine

## 2018-05-08 ENCOUNTER — Encounter: Payer: Self-pay | Admitting: Internal Medicine

## 2018-05-08 VITALS — BP 142/86 | HR 80 | Temp 98.2°F | Ht 62.0 in | Wt 205.0 lb

## 2018-05-08 DIAGNOSIS — J019 Acute sinusitis, unspecified: Secondary | ICD-10-CM | POA: Insufficient documentation

## 2018-05-08 DIAGNOSIS — E785 Hyperlipidemia, unspecified: Secondary | ICD-10-CM | POA: Diagnosis not present

## 2018-05-08 DIAGNOSIS — R079 Chest pain, unspecified: Secondary | ICD-10-CM

## 2018-05-08 DIAGNOSIS — E559 Vitamin D deficiency, unspecified: Secondary | ICD-10-CM | POA: Diagnosis not present

## 2018-05-08 DIAGNOSIS — Z23 Encounter for immunization: Secondary | ICD-10-CM | POA: Diagnosis not present

## 2018-05-08 DIAGNOSIS — R7303 Prediabetes: Secondary | ICD-10-CM

## 2018-05-08 DIAGNOSIS — R05 Cough: Secondary | ICD-10-CM | POA: Diagnosis not present

## 2018-05-08 LAB — URINALYSIS, ROUTINE W REFLEX MICROSCOPIC
Bilirubin Urine: NEGATIVE
Hgb urine dipstick: NEGATIVE
Ketones, ur: NEGATIVE
Nitrite: NEGATIVE
RBC / HPF: NONE SEEN
Specific Gravity, Urine: 1.01 (ref 1.000–1.030)
Total Protein, Urine: NEGATIVE
Urine Glucose: NEGATIVE
Urobilinogen, UA: 0.2 (ref 0.0–1.0)
pH: 7.5 (ref 5.0–8.0)

## 2018-05-08 LAB — HEMOGLOBIN A1C: Hgb A1c MFr Bld: 6.2 % (ref 4.6–6.5)

## 2018-05-08 LAB — LIPID PANEL
CHOL/HDL RATIO: 5
CHOLESTEROL: 268 mg/dL — AB (ref 0–200)
HDL: 52.4 mg/dL (ref >39.00)
NonHDL: 215.3
TRIGLYCERIDES: 206 mg/dL — AB (ref 0.0–149.0)
VLDL: 41.2 mg/dL — AB (ref 0.0–40.0)

## 2018-05-08 LAB — HEPATIC FUNCTION PANEL
ALT: 14 U/L (ref 0–35)
AST: 13 U/L (ref 0–37)
Albumin: 4.3 g/dL (ref 3.5–5.2)
Alkaline Phosphatase: 79 U/L (ref 39–117)
Bilirubin, Direct: 0.1 mg/dL (ref 0.0–0.3)
Total Bilirubin: 0.6 mg/dL (ref 0.2–1.2)
Total Protein: 7.4 g/dL (ref 6.0–8.3)

## 2018-05-08 LAB — BASIC METABOLIC PANEL WITH GFR
BUN: 16 mg/dL (ref 6–23)
CO2: 31 meq/L (ref 19–32)
Calcium: 9.8 mg/dL (ref 8.4–10.5)
Chloride: 102 meq/L (ref 96–112)
Creatinine, Ser: 0.79 mg/dL (ref 0.40–1.20)
GFR: 75.99 mL/min
Glucose, Bld: 108 mg/dL — ABNORMAL HIGH (ref 70–99)
Potassium: 4.2 meq/L (ref 3.5–5.1)
Sodium: 139 meq/L (ref 135–145)

## 2018-05-08 LAB — LDL CHOLESTEROL, DIRECT: Direct LDL: 189 mg/dL

## 2018-05-08 LAB — VITAMIN D 25 HYDROXY (VIT D DEFICIENCY, FRACTURES): VITD: 32.71 ng/mL (ref 30.00–100.00)

## 2018-05-08 MED ORDER — HYDROCODONE-HOMATROPINE 5-1.5 MG/5ML PO SYRP: 5.0000 mL | ORAL_SOLUTION | Freq: Four times a day (QID) | ORAL | 0 refills | Status: AC | PRN

## 2018-05-08 MED ORDER — AZITHROMYCIN 250 MG PO TABS: ORAL_TABLET | ORAL | 1 refills | Status: DC

## 2018-05-08 NOTE — Assessment & Plan Note (Signed)
stable overall by history and exam, recent data reviewed with pt, and pt to continue medical treatment as before,  to f/u any worsening symptoms or concerns  

## 2018-05-08 NOTE — Assessment & Plan Note (Signed)
Mild to mod, for antibx course,  to f/u any worsening symptoms or concerns 

## 2018-05-08 NOTE — Progress Notes (Signed)
Subjective:    Patient ID: Debra Atkinson, female    DOB: Jan 06, 1946, 72 y.o.   MRN: [id number]  HPI   Here with 2-3 days acute onset fever, facial pain, pressure, headache, general weakness and malaise, and greenish d/c, with mild ST and cough, but pt denies chest pain, wheezing, increased sob or doe, orthopnea, PND, increased LE swelling, palpitations, dizziness or syncope.  Does have several wks ongoing nasal allergy symptoms with clearish congestion, itch and sneezing, without fever, pain, ST, cough, swelling or wheezing.  She notes her BP has been mild elevated in the 140/s recently.  Pt denies new neurological symptoms such as new headache, or facial or extremity weakness or numbness   Pt denies polydipsia, polyuria,   Also hit head posteriorly right with sitting up too quickly in [place] [employer].    Bp has been higher lately, one time even 191/160 per pt, then later came to 140/80  Tolerating new statin Past Medical History:  Diagnosis Date  . Concussion '06, '11  . Diabetes mellitus    diet management  . Family history of colon cancer 02/28/2016  . Fibromyalgia 10/08/2013  . Genital warts    treated podophyllin  . Hyperlipidemia    diet managed  . Varicella    Past Surgical History:  Procedure Laterality Date  . APPENDECTOMY  1979  . [place]  . CHOLECYSTECTOMY  1979   laparotomy    reports that she has never smoked. She has never used smokeless tobacco. She reports that she does not drink alcohol or use drugs. family history includes COPD in her mother; Cancer (age of onset: 5) in her mother; Diabetes in her maternal grandfather and mother; Heart disease in her father and mother; Rheumatic fever in her father. Allergies  Allergen Reactions  . Prednisone Itching    Redness from injection   Current Outpatient Medications on File Prior to Visit  Medication Sig Dispense Refill  . Cholecalciferol (CVS D3) 2000 units CAPS Take by mouth.    .  Cyanocobalamin (CVS VITAMIN B-12 PO) Take 2,500 tablets by mouth daily.     . diazepam (VALIUM) 5 MG tablet Take 1 tablet (5 mg total) by mouth every 12 (twelve) hours as needed for anxiety. 10 tablet 0  . ibuprofen (ADVIL,MOTRIN) 200 MG tablet Take 200 mg by mouth every 6 (six) hours as needed.    . meclizine (ANTIVERT) 25 MG tablet Take 1 tablet (25 mg total) by mouth 3 (three) times daily as needed for dizziness. 60 tablet 3  . tiZANidine (ZANAFLEX) 4 MG tablet Take 1 tablet (4 mg total) by mouth every 6 (six) hours as needed for muscle spasms. 30 tablet 1  . triamcinolone (NASACORT AQ) 55 MCG/ACT AERO nasal inhaler Place 2 sprays into the nose daily. 3 Inhaler 3  . cetirizine (ZYRTEC) 10 MG tablet Take 1 tablet (10 mg total) daily by mouth. 30 tablet 11   No current facility-administered medications on file prior to visit.    Review of Systems  Constitutional: Negative for other unusual diaphoresis or sweats HENT: Negative for ear discharge or swelling Eyes: Negative for other worsening visual disturbances Respiratory: Negative for stridor or other swelling  Gastrointestinal: Negative for worsening distension or other blood Genitourinary: Negative for retention or other urinary change Musculoskeletal: Negative for other MSK pain or swelling Skin: Negative for color change or other new lesions Neurological: Negative for worsening tremors and other numbness  Psychiatric/Behavioral: Negative for  worsening agitation or other fatigue All other system neg per pt    Objective:   Physical Exam BP (!) 142/86   Pulse 80   Temp 98.2 F (36.8 C) (Oral)   Ht 5\' 2"  (1.575 m)   Wt 205 lb (93 kg)   SpO2 96%   BMI 37.49 kg/m  VS noted, mild ill Constitutional: Pt appears in NAD HENT: Head: NCAT.  Right Ear: External ear normal.  Left Ear: External ear normal.  Bilat tm's with mild erythema.  Max sinus areas mild tender.  Pharynx with mild erythema, no exudate  Eyes: . Pupils are equal,  round, and reactive to light. Conjunctivae and EOM are normal Nose: without d/c or deformity Neck: Neck supple. Gross normal ROM Cardiovascular: Normal rate and regular rhythm.   Pulmonary/Chest: Effort normal and breath sounds without rales or wheezing.  Abd:  Soft, NT, ND, + BS, no organomegaly Neurological: Pt is alert. At baseline orientation, motor grossly intact Skin: Skin is warm. No rashes, other new lesions, no LE edema Psychiatric: Pt behavior is normal without agitation  No other exam findings Lab Results  Component Value Date   WBC 6.0 11/05/2017   HGB 13.6 11/05/2017   HCT 40.3 11/05/2017   PLT 300.0 11/05/2017   GLUCOSE 108 (H) 05/08/2018   CHOL 268 (H) 05/08/2018   TRIG 206.0 (H) 05/08/2018   HDL 52.40 05/08/2018   LDLDIRECT 189.0 05/08/2018   LDLCALC 142 (H) 11/05/2017   ALT 14 05/08/2018   AST 13 05/08/2018   NA 139 05/08/2018   K 4.2 05/08/2018   CL 102 05/08/2018   CREATININE 0.79 05/08/2018   BUN 16 05/08/2018   CO2 31 05/08/2018   TSH 2.24 11/05/2017   INR 0.92 05/24/2011   HGBA1C 6.2 05/08/2018   MICROALBUR 0.9 10/26/2015          Assessment & Plan:

## 2018-05-08 NOTE — Assessment & Plan Note (Signed)
For f/u lab today 

## 2018-05-08 NOTE — Patient Instructions (Addendum)
Please take all new medication as prescribed - the antibiotic, cough medicine as needed  Please continue all other medications as before, and refills have been done if requested.  Please have the pharmacy call with any other refills you may need.  Please continue your efforts at being more active, low cholesterol diet, and weight control.  Please keep your appointments with your specialists as you may have planned  .Please go to the XRAY Department in the Basement (go straight as you get off the elevator) for the x-ray testing  Please go to the LAB in the Basement (turn left off the elevator) for the tests to be done today  You will be contacted by phone if any changes need to be made immediately.  Otherwise, you will receive a letter about your results with an explanation, but please check with MyChart first.  Please remember to sign up for MyChart if you have not done so, as this will be important to you in the future with finding out test results, communicating by private email, and scheduling acute appointments online when needed.  Please return in 6 months, or sooner if needed, with Lab testing done 3-5 days before

## 2018-05-09 ENCOUNTER — Other Ambulatory Visit: Payer: Self-pay | Admitting: Internal Medicine

## 2018-05-09 MED ORDER — ROSUVASTATIN CALCIUM 20 MG PO TABS: 20.0000 mg | ORAL_TABLET | Freq: Every day | ORAL | 3 refills | Status: DC

## 2018-05-12 ENCOUNTER — Telehealth: Payer: Self-pay

## 2018-05-12 NOTE — Telephone Encounter (Signed)
-----   Message from Biagio Borg, MD sent at 05/09/2018  8:19 AM EST ----- Left message on MyChart, pt to cont same tx except  The test results show that your current treatment is OK, except the LDl cholesterol is still very high.  Please start a prescription for crestor 20 mg per day, to help reduce this and your future heart disease and stroke risk.  I will send the prescription, and you should hear from the office as well.    Debra Atkinson to please inform pt, I will do rx

## 2018-05-12 NOTE — Telephone Encounter (Signed)
Pt has been informed of results and expressed understanding.  °

## 2018-07-23 ENCOUNTER — Other Ambulatory Visit: Payer: Self-pay | Admitting: Internal Medicine

## 2018-09-01 ENCOUNTER — Ambulatory Visit (INDEPENDENT_AMBULATORY_CARE_PROVIDER_SITE_OTHER): Payer: Medicare HMO | Admitting: Internal Medicine

## 2018-09-01 ENCOUNTER — Ambulatory Visit (INDEPENDENT_AMBULATORY_CARE_PROVIDER_SITE_OTHER)
Admission: RE | Admit: 2018-09-01 | Discharge: 2018-09-01 | Disposition: A | Discharge status: Home / Self Care | Payer: Medicare HMO | Source: Ambulatory Visit | Attending: Internal Medicine | Admitting: Internal Medicine

## 2018-09-01 ENCOUNTER — Encounter: Payer: Self-pay | Admitting: Internal Medicine

## 2018-09-01 DIAGNOSIS — M25561 Pain in right knee: Secondary | ICD-10-CM | POA: Insufficient documentation

## 2018-09-01 DIAGNOSIS — S8991XA Unspecified injury of right lower leg, initial encounter: Secondary | ICD-10-CM | POA: Diagnosis not present

## 2018-09-01 DIAGNOSIS — R7303 Prediabetes: Secondary | ICD-10-CM | POA: Diagnosis not present

## 2018-09-01 DIAGNOSIS — M79674 Pain in right toe(s): Secondary | ICD-10-CM

## 2018-09-01 DIAGNOSIS — S99921A Unspecified injury of right foot, initial encounter: Secondary | ICD-10-CM | POA: Diagnosis not present

## 2018-09-01 NOTE — Assessment & Plan Note (Signed)
?   Strain vs other such as fx, for xray, consider ortho if fx

## 2018-09-01 NOTE — Progress Notes (Signed)
Subjective:    Patient ID: Debra Atkinson, female    DOB: [birth year], 73 y.o.   MRN: [id number]  HPI  Here 2 days after a fall off an elevation she thinks 4 ft after got off balance with church cleaning; hit the right knee and seemed to do something to the right great toe and distal assoc ray b/c has persistent maybe worsening swelling, pain 5/10 and decreased ROM with some limping to walk today in her crocs.  Worse to walk, better to sit, best in the AM after lying down all night.  No fever. Pt denies chest pain, increased sob or doe, wheezing, orthopnea, PND, increased LE swelling, palpitations, dizziness or syncope.  Pt denies new neurological symptoms such as new headache, or facial or extremity weakness or numbness   Pt denies polydipsia, polyuria Past Medical History:  Diagnosis Date  . Concussion '06, '11  . Diabetes mellitus    diet management  . Family history of colon cancer 02/28/2016  . Fibromyalgia 10/08/2013  . Genital warts    treated podophyllin  . Hyperlipidemia    diet managed  . Varicella    Past Surgical History:  Procedure Laterality Date  . APPENDECTOMY  1979  . [place]  . CHOLECYSTECTOMY  1979   laparotomy    reports that she has never smoked. She has never used smokeless tobacco. She reports that she does not drink alcohol or use drugs. family history includes COPD in her mother; Cancer (age of onset: 14) in her mother; Diabetes in her maternal grandfather and mother; Heart disease in her father and mother; Rheumatic fever in her father. Allergies  Allergen Reactions  . Prednisone Itching    Redness from injection   Current Outpatient Medications on File Prior to Visit  Medication Sig Dispense Refill  . cetirizine (ZYRTEC) 10 MG tablet TAKE 1 TABLET BY MOUTH DAILY 30 tablet 5  . Cholecalciferol (CVS D3) 2000 units CAPS Take by mouth.    . Cyanocobalamin (CVS VITAMIN B-12 PO) Take 2,500 tablets by mouth daily.     . diazepam (VALIUM) 5 MG  tablet Take 1 tablet (5 mg total) by mouth every 12 (twelve) hours as needed for anxiety. 10 tablet 0  . ibuprofen (ADVIL,MOTRIN) 200 MG tablet Take 200 mg by mouth every 6 (six) hours as needed.    . meclizine (ANTIVERT) 25 MG tablet Take 1 tablet (25 mg total) by mouth 3 (three) times daily as needed for dizziness. 60 tablet 3  . rosuvastatin (CRESTOR) 20 MG tablet Take 1 tablet (20 mg total) by mouth daily. 90 tablet 3  . tiZANidine (ZANAFLEX) 4 MG tablet Take 1 tablet (4 mg total) by mouth every 6 (six) hours as needed for muscle spasms. 30 tablet 1  . triamcinolone (NASACORT AQ) 55 MCG/ACT AERO nasal inhaler Place 2 sprays into the nose daily. 3 Inhaler 3   No current facility-administered medications on file prior to visit.    Review of Systems  Constitutional: Negative for other unusual diaphoresis or sweats HENT: Negative for ear discharge or swelling Eyes: Negative for other worsening visual disturbances Respiratory: Negative for stridor or other swelling  Gastrointestinal: Negative for worsening distension or other blood Genitourinary: Negative for retention or other urinary change Musculoskeletal: Negative for other MSK pain or swelling Skin: Negative for color change or other new lesions Neurological: Negative for worsening tremors and other numbness  Psychiatric/Behavioral: Negative for worsening agitation or other fatigue All other system neg per  pt    Objective:   Physical Exam BP (!) 148/90   Pulse 91   Temp 98.3 F (36.8 C) (Oral)   Ht 5\' 2"  (1.575 m)   Wt 202 lb (91.6 kg)   SpO2 95%   BMI 36.95 kg/m  VS noted,  Constitutional: Pt appears in NAD HENT: Head: NCAT.  Right Ear: External ear normal.  Left Ear: External ear normal.  Eyes: . Pupils are equal, round, and reactive to light. Conjunctivae and EOM are normal Nose: without d/c or deformity Neck: Neck supple. Gross normal ROM Cardiovascular: Normal rate and regular rhythm.   Pulmonary/Chest: Effort  normal and breath sounds without rales or wheezing.  Neurological: Pt is alert. At baseline orientation, motor grossly intact Skin: Skin is warm. No rashes, other new lesions, no LE edema Psychiatric: Pt behavior is normal without agitation  Right knee with trace effusion, warmth, tender area right lateral patella and lateral knee joint line, right great toe with 1-2+ swelling and tender worse doral at the proximal toe with possible effusion MTP Lab Results  Component Value Date   WBC 6.0 11/05/2017   HGB 13.6 11/05/2017   HCT 40.3 11/05/2017   PLT 300.0 11/05/2017   GLUCOSE 108 (H) 05/08/2018   CHOL 268 (H) 05/08/2018   TRIG 206.0 (H) 05/08/2018   HDL 52.40 05/08/2018   LDLDIRECT 189.0 05/08/2018   LDLCALC 142 (H) 11/05/2017   ALT 14 05/08/2018   AST 13 05/08/2018   NA 139 05/08/2018   K 4.2 05/08/2018   CL 102 05/08/2018   CREATININE 0.79 05/08/2018   BUN 16 05/08/2018   CO2 31 05/08/2018   TSH 2.24 11/05/2017   INR 0.92 05/24/2011   HGBA1C 6.2 05/08/2018   MICROALBUR 0.9 10/26/2015        Assessment & Plan:

## 2018-09-01 NOTE — Patient Instructions (Signed)
Please continue all other medications as before, and refills have been done if requested.  Please have the pharmacy call with any other refills you may need.  Please continue your efforts at being more active, low cholesterol diet, and weight control.  You are otherwise up to date with prevention measures today.  Please keep your appointments with your specialists as you may have planned  Please go to the XRAY Department in the Basement (go straight as you get off the elevator) for the x-ray testing  You will be contacted by phone if any changes need to be made immediately.  Otherwise, you will receive a letter about your results with an explanation, but please check with MyChart first.  Please remember to sign up for MyChart if you have not done so, as this will be important to you in the future with finding out test results, communicating by private email, and scheduling acute appointments online when needed. 

## 2018-09-01 NOTE — Assessment & Plan Note (Signed)
Moderate s/p fall, for right knee xray r/o fx, consider ortho referral

## 2018-09-01 NOTE — Assessment & Plan Note (Signed)
stable overall by history and exam, recent data reviewed with pt, and pt to continue medical treatment as before,  to f/u any worsening symptoms or concerns  

## 2018-09-04 ENCOUNTER — Telehealth: Payer: Self-pay | Admitting: *Deleted

## 2018-09-04 NOTE — Telephone Encounter (Signed)
Patient called for x-ray results- she does not use her MyChart- since they were released to MyChart- read PCP results and recommendations.

## 2018-09-23 ENCOUNTER — Telehealth: Payer: Self-pay | Admitting: Internal Medicine

## 2018-09-23 NOTE — Telephone Encounter (Signed)
Copied from [place] [phone]. Topic: General - Other >> Sep 23, 2018  1:01 PM [place], [place] A wrote: Reason for CRM:   pt called in and stated she is have a lot of sinus pressure, she had a little bleed on the tissue.  She feels as if she has sinus infection.  No fever no cough, no other symptoms.  She would like like to know if Dr [name] Reichmann would call something in for a sinus infecfion  Pt wanted Dr [name] Reichmann know that she is now taking vit c, a woman's one a day.  She just wanted to make sure this is ok    [place], [place] - [phone] [place] (Phone)  Best number  [phone]

## 2018-09-23 NOTE — Telephone Encounter (Signed)
appt scheduled

## 2018-09-23 NOTE — Telephone Encounter (Signed)
Copied from [place] [phone]. Topic: General - Other >> Sep 23, 2018  1:01 PM [place], [place] A wrote: Reason for CRM:   pt called in and stated she is have a lot of sinus pressure, she had a little bleed on the tissue.  She feels as if she has sinus infection.  No fever no cough, no other symptoms.  She would like like to know if Dr [name] Reichmann would call something in for a sinus infecfion  Pt wanted Dr [name] Reichmann know that she is now taking vit c, a woman's one a day.  She just wanted to make sure this is ok    [place], [place] - [phone] [place] (Phone)  Best number  [phone]

## 2018-09-23 NOTE — Telephone Encounter (Signed)
Pt needs an OV for antibiotic. Please schedule.

## 2018-09-23 NOTE — Telephone Encounter (Signed)
This has been addressed in prior phone note. Please view.

## 2018-09-24 ENCOUNTER — Ambulatory Visit (INDEPENDENT_AMBULATORY_CARE_PROVIDER_SITE_OTHER): Payer: Medicare HMO | Admitting: Internal Medicine

## 2018-09-24 ENCOUNTER — Other Ambulatory Visit: Payer: Self-pay

## 2018-09-24 ENCOUNTER — Encounter: Payer: Self-pay | Admitting: Internal Medicine

## 2018-09-24 VITALS — BP 132/84 | HR 75 | Temp 97.8°F | Ht 62.0 in | Wt 201.0 lb

## 2018-09-24 DIAGNOSIS — M797 Fibromyalgia: Secondary | ICD-10-CM

## 2018-09-24 DIAGNOSIS — R7303 Prediabetes: Secondary | ICD-10-CM

## 2018-09-24 DIAGNOSIS — J309 Allergic rhinitis, unspecified: Secondary | ICD-10-CM | POA: Diagnosis not present

## 2018-09-24 MED ORDER — METHYLPREDNISOLONE 4 MG PO TBPK: ORAL_TABLET | ORAL | 0 refills | Status: DC

## 2018-09-24 MED ORDER — TRIAMCINOLONE ACETONIDE 55 MCG/ACT NA AERO: 2.0000 | INHALATION_SPRAY | Freq: Every day | NASAL | 3 refills | Status: DC

## 2018-09-24 NOTE — Progress Notes (Signed)
Subjective:    Patient ID: Debra Atkinson, female    DOB: September 26, 1945, 73 y.o.   MRN: [id number]  HPI  Here with flare of worsening nasal congestion  - Does have several 2-3 wk wks worsening constant pressure like nasal allergy symptoms with clearish congestion, itch and sneezing, without fever, pain, ST, cough, swelling or wheezing.  Pt denies chest pain, increased sob or doe, wheezing, orthopnea, PND, increased LE swelling, palpitations, dizziness or syncope.  Pt denies new neurological symptoms such as new headache, or facial or extremity weakness or numbness   Pt denies polydipsia, polyuria,  Has typical FMS like pain not really worse recently, has stable myalgias Past Medical History:  Diagnosis Date  . Concussion '06, '11  . Diabetes mellitus    diet management  . Family history of colon cancer 02/28/2016  . Fibromyalgia 10/08/2013  . Genital warts    treated podophyllin  . Hyperlipidemia    diet managed  . Varicella    Past Surgical History:  Procedure Laterality Date  . APPENDECTOMY  1979  . [place]  . CHOLECYSTECTOMY  1979   laparotomy    reports that she has never smoked. She has never used smokeless tobacco. She reports that she does not drink alcohol or use drugs. family history includes COPD in her mother; Cancer (age of onset: 67) in her mother; Diabetes in her maternal grandfather and mother; Heart disease in her father and mother; Rheumatic fever in her father. Allergies  Allergen Reactions  . Prednisone Itching    Redness from injection   Current Outpatient Medications on File Prior to Visit  Medication Sig Dispense Refill  . cetirizine (ZYRTEC) 10 MG tablet TAKE 1 TABLET BY MOUTH DAILY 30 tablet 5  . Cholecalciferol (CVS D3) 2000 units CAPS Take by mouth.    . Cyanocobalamin (CVS VITAMIN B-12 PO) Take 2,500 tablets by mouth daily.     . diazepam (VALIUM) 5 MG tablet Take 1 tablet (5 mg total) by mouth every 12 (twelve) hours as needed for anxiety.  10 tablet 0  . ibuprofen (ADVIL,MOTRIN) 200 MG tablet Take 200 mg by mouth every 6 (six) hours as needed.    . meclizine (ANTIVERT) 25 MG tablet Take 1 tablet (25 mg total) by mouth 3 (three) times daily as needed for dizziness. 60 tablet 3  . rosuvastatin (CRESTOR) 20 MG tablet Take 1 tablet (20 mg total) by mouth daily. 90 tablet 3  . tiZANidine (ZANAFLEX) 4 MG tablet Take 1 tablet (4 mg total) by mouth every 6 (six) hours as needed for muscle spasms. 30 tablet 1   No current facility-administered medications on file prior to visit.    Review of Systems  Constitutional: Negative for other unusual diaphoresis or sweats HENT: Negative for ear discharge or swelling Eyes: Negative for other worsening visual disturbances Respiratory: Negative for stridor or other swelling  Gastrointestinal: Negative for worsening distension or other blood Genitourinary: Negative for retention or other urinary change Musculoskeletal: Negative for other MSK pain or swelling Skin: Negative for color change or other new lesions Neurological: Negative for worsening tremors and other numbness  Psychiatric/Behavioral: Negative for worsening agitation or other fatigue All other system neg per pt    Objective:   Physical Exam BP 132/84   Pulse 75   Temp 97.8 F (36.6 C) (Oral)   Ht 5\' 2"  (1.575 m)   Wt 201 lb (91.2 kg)   SpO2 98%   BMI 36.76 kg/m  VS noted,  Constitutional: Pt appears in NAD HENT: Head: NCAT.  Right Ear: External ear normal.  Left Ear: External ear normal.  Bilat tm's with mild erythema.  Max sinus areas non tender.  Pharynx with mild erythema, no exudate Eyes: . Pupils are equal, round, and reactive to light. Conjunctivae and EOM are normal Nose: without d/c or deformity Neck: Neck supple. Gross normal ROM Cardiovascular: Normal rate and regular rhythm.   Pulmonary/Chest: Effort normal and breath sounds without rales or wheezing.  Neurological: Pt is alert. At baseline orientation,  motor grossly intact Skin: Skin is warm. No rashes, other new lesions, no LE edema Psychiatric: Pt behavior is normal without agitation  No other exam findings Lab Results  Component Value Date   WBC 6.0 11/05/2017   HGB 13.6 11/05/2017   HCT 40.3 11/05/2017   PLT 300.0 11/05/2017   GLUCOSE 108 (H) 05/08/2018   CHOL 268 (H) 05/08/2018   TRIG 206.0 (H) 05/08/2018   HDL 52.40 05/08/2018   LDLDIRECT 189.0 05/08/2018   LDLCALC 142 (H) 11/05/2017   ALT 14 05/08/2018   AST 13 05/08/2018   NA 139 05/08/2018   K 4.2 05/08/2018   CL 102 05/08/2018   CREATININE 0.79 05/08/2018   BUN 16 05/08/2018   CO2 31 05/08/2018   TSH 2.24 11/05/2017   INR 0.92 05/24/2011   HGBA1C 6.2 05/08/2018   MICROALBUR 0.9 10/26/2015       Assessment & Plan:

## 2018-09-24 NOTE — Patient Instructions (Signed)
Please take all new medication as prescribed - the medrol medication and nasacort for allergies  Please continue all other medications as before, including the zyrtec  You can also take Delsym OTC for cough, and/or Mucinex (or it's generic off brand) for congestion, and tylenol as needed for pain.  Please have the pharmacy call with any other refills you may need.  Please continue your efforts at being more active, low cholesterol diet, and weight control.  Please keep your appointments with your specialists as you may have planned

## 2018-09-24 NOTE — Assessment & Plan Note (Signed)
stable overall by history and exam, recent data reviewed with pt, and pt to continue medical treatment as before,  to f/u any worsening symptoms or concerns  

## 2018-09-24 NOTE — Assessment & Plan Note (Signed)
With liekly seasonal flare, for medro pack asd, nasacort asd, zyrtec daily, and mucinex otc prn as well,  to f/u any worsening symptoms or concerns

## 2018-10-01 ENCOUNTER — Telehealth: Payer: Self-pay

## 2018-10-01 NOTE — Telephone Encounter (Signed)
Per PCP the medication does cause minor immune system decrease to allow the medication to work properly. PCP stated that it would be up to the pt if she would like to take the medication or not. I have informed pt of the above information and she expressed understanding.   Copied from [place] [id number]. Topic: General - Other >> Sep 30, 2018 10:17 PM Percell Belt A wrote: Reason for CRM: pt called in and left a vm in the general mailbox- she has some questions about the meds she was give on her recent visit with Dr.  She would like for a nurse to give her a call. She is unsure if she should take some of the meds or not because she read that one of them brings down our immune system.

## 2018-10-06 ENCOUNTER — Ambulatory Visit: Payer: Self-pay | Admitting: *Deleted

## 2018-10-06 MED ORDER — DIAZEPAM 5 MG PO TABS: 5.0000 mg | ORAL_TABLET | Freq: Two times a day (BID) | ORAL | 0 refills | Status: DC | PRN

## 2018-10-06 NOTE — Addendum Note (Signed)
Addended by: Biagio Borg on: 10/06/2018 01:12 PM   Modules accepted: Orders

## 2018-10-06 NOTE — Telephone Encounter (Signed)
Done erx 

## 2018-10-06 NOTE — Telephone Encounter (Signed)
Pt reports vertigo, onset this AM. States positional, worse with turning head, sitting to standing. Saw Dr. [name] Reichmann 09/24/2018 "Fluid in right ear." States prescribed steroids but didn't take "Afraid because they lower immune system." States right ear pressure. Reports one episode of vomiting, when "Tilted head back to drink water." States took Meclizine, minimal relief. Pt states when at hospital with vertigo she was given valium "For the dizziness." States "It helped a lot more than meclizine". Pt requesting refill of valium or questioning if she should now take the steroids. Care advise given per protocol. Advised to go to ED if symptoms worsen, S/S dehydration, severe headache, weakness.  Pts Email verified but states "I don't use it, no computer." Does have smart phone.  Please advise: CB# [phone].  Reason for Disposition . [1] MODERATE dizziness (e.g., vertigo; feels very unsteady, interferes with normal activities) AND [2] has been evaluated by physician for this  Answer Assessment - Initial Assessment Questions 1. DESCRIPTION: "Describe your dizziness."     Spinning 2. VERTIGO: "Do you feel like either you or the room is spinning or tilting?"     Positional, turning head worse 3. LIGHTHEADED: "Do you feel lightheaded?" (e.g., somewhat faint, woozy, weak upon standing)     spinning 4. SEVERITY: "How bad is it?"  "Can you walk?"   - MILD - Feels unsteady but walking normally.   - MODERATE - Feels very unsteady when walking, but not falling; interferes with normal activities (e.g., school, work) .   - SEVERE - Unable to walk without falling (requires assistance).     moderate 5. ONSET:  "When did the dizziness begin?"     This AM 6. AGGRAVATING FACTORS: "Does anything make it worse?" (e.g., standing, change in head position)     Turning head, sitting to standing 7. CAUSE: "What do you think is causing the dizziness?"     "Fluid in ear." 8. RECURRENT SYMPTOM: "Have you had dizziness  before?" If so, ask: "When was the last time?" "What happened that time?"     "Yes, fluid in ear." 9. OTHER SYMPTOMS: "Do you have any other symptoms?" (e.g., headache, weakness, numbness, vomiting, earache)     1 episode of vomiting  Protocols used: DIZZINESS - VERTIGO-A-AH

## 2018-11-07 ENCOUNTER — Ambulatory Visit (INDEPENDENT_AMBULATORY_CARE_PROVIDER_SITE_OTHER): Payer: Medicare HMO | Admitting: Internal Medicine

## 2018-11-07 DIAGNOSIS — Z0000 Encounter for general adult medical examination without abnormal findings: Secondary | ICD-10-CM

## 2018-11-07 DIAGNOSIS — R7303 Prediabetes: Secondary | ICD-10-CM

## 2018-11-07 DIAGNOSIS — E538 Deficiency of other specified B group vitamins: Secondary | ICD-10-CM | POA: Diagnosis not present

## 2018-11-07 DIAGNOSIS — R42 Dizziness and giddiness: Secondary | ICD-10-CM | POA: Diagnosis not present

## 2018-11-07 DIAGNOSIS — J309 Allergic rhinitis, unspecified: Secondary | ICD-10-CM

## 2018-11-07 DIAGNOSIS — E559 Vitamin D deficiency, unspecified: Secondary | ICD-10-CM | POA: Diagnosis not present

## 2018-11-07 DIAGNOSIS — H814 Vertigo of central origin: Secondary | ICD-10-CM | POA: Diagnosis not present

## 2018-11-07 MED ORDER — TRIAMCINOLONE ACETONIDE 55 MCG/ACT NA AERO: 2.0000 | INHALATION_SPRAY | Freq: Every day | NASAL | 3 refills | Status: DC

## 2018-11-07 MED ORDER — MECLIZINE HCL 25 MG PO TABS: 25.0000 mg | ORAL_TABLET | Freq: Three times a day (TID) | ORAL | 3 refills | Status: DC | PRN

## 2018-11-07 NOTE — Progress Notes (Signed)
Patient ID: Debra Atkinson, female   DOB: 11/29/1945, 73 y.o.   MRN: [id number]  Cumulative time during 7-day interval 18 min, there was not an associated office visit for this concern within a 7 day period.  Verbal consent for services obtained from patient prior to services given.  Names of all persons present for services: Cathlean Cower, MD, patient  Chief complaint: general medical follow up  History Here to f/u; overall doing ok,  Pt denies chest pain, increasing sob or doe, wheezing, orthopnea, PND, increased LE swelling, palpitations, dizziness or syncope.  Pt denies new neurological symptoms such as new headache, or facial or extremity weakness or numbness.  Pt denies polydipsia, polyuria, or low sugar episode.  Pt states overall good compliance with meds, mostly trying to follow appropriate diet, with wt overall stable,  but little exercise however.  Vertigo has been worse recently, maybe different, almost constant moderate to severe last few day, despite her allergy tx with zyrtec. Does have several wks ongoing nasal allergy symptoms with clearish congestion, itch and sneezing, without fever, pain, ST, cough, swelling or wheezing.   Past Medical History:  Diagnosis Date  . Concussion '06, '11  . Diabetes mellitus    diet management  . Family history of colon cancer 02/28/2016  . Fibromyalgia 10/08/2013  . Genital warts    treated podophyllin  . Hyperlipidemia    diet managed  . Varicella    No results found for this or any previous visit (from the past 70 hour(s)). Current Outpatient Medications on File Prior to Visit  Medication Sig Dispense Refill  . cetirizine (ZYRTEC) 10 MG tablet TAKE 1 TABLET BY MOUTH DAILY 30 tablet 5  . Cholecalciferol ([employer] D3) 2000 units CAPS Take by mouth.    . Cyanocobalamin (CVS VITAMIN B-12 PO) Take 2,500 tablets by mouth daily.     . diazepam (VALIUM) 5 MG tablet Take 1 tablet (5 mg total) by mouth every 12 (twelve) hours as needed for anxiety. 10  tablet 0  . ibuprofen (ADVIL,MOTRIN) 200 MG tablet Take 200 mg by mouth every 6 (six) hours as needed.    . methylPREDNISolone (MEDROL) 4 MG TBPK tablet 4 tab by mouth x 3day,2tab x 3day,1tab x 3day 21 tablet 0  . rosuvastatin (CRESTOR) 20 MG tablet Take 1 tablet (20 mg total) by mouth daily. 90 tablet 3  . tiZANidine (ZANAFLEX) 4 MG tablet Take 1 tablet (4 mg total) by mouth every 6 (six) hours as needed for muscle spasms. 30 tablet 1   No current facility-administered medications on file prior to visit.    Lab Results  Component Value Date   WBC 6.0 11/05/2017   HGB 13.6 11/05/2017   HCT 40.3 11/05/2017   PLT 300.0 11/05/2017   GLUCOSE 108 (H) 05/08/2018   CHOL 268 (H) 05/08/2018   TRIG 206.0 (H) 05/08/2018   HDL 52.40 05/08/2018   LDLDIRECT 189.0 05/08/2018   LDLCALC 142 (H) 11/05/2017   ALT 14 05/08/2018   AST 13 05/08/2018   NA 139 05/08/2018   K 4.2 05/08/2018   CL 102 05/08/2018   CREATININE 0.79 05/08/2018   BUN 16 05/08/2018   CO2 31 05/08/2018   TSH 2.24 11/05/2017   INR 0.92 05/24/2011   HGBA1C 6.2 05/08/2018   MICROALBUR 0.9 10/26/2015   A/P/next steps:  Veritigo - for meclizine prn refill, for MRI due to worsening  Allergies - add nasacort asd  PreDM - for lab in 3 months  Vit D  Def - for lab in 3 months  Vit B12 def - for lab in 3 months  HLD - for diet, declines statin  Cathlean Cower MD

## 2018-11-07 NOTE — Patient Instructions (Addendum)
Please take all new medication as prescribed - the nasacort  Please continue all other medications as before, and refills have been done if requested - the meclizine as needed  You can also take Delsym OTC for cough, and/or Mucinex (or it's generic off brand) for congestion, and tylenol as needed for pain.  Please have the pharmacy call with any other refills you may need.  Please continue your efforts at being more active, low cholesterol diet, and weight control  Please keep your appointments with your specialists as you may have planned  You will be contacted regarding the referral for: MRI of head  Please return in 3 months, or sooner if needed, with Lab testing done 3-5 days before

## 2018-11-08 ENCOUNTER — Encounter: Payer: Self-pay | Admitting: Internal Medicine

## 2018-11-12 DIAGNOSIS — H524 Presbyopia: Secondary | ICD-10-CM | POA: Diagnosis not present

## 2018-11-20 ENCOUNTER — Ambulatory Visit
Admission: RE | Admit: 2018-11-20 | Discharge: 2018-11-20 | Disposition: A | Discharge status: Home / Self Care | Payer: Medicare HMO | Source: Ambulatory Visit | Attending: Internal Medicine | Admitting: Internal Medicine

## 2018-11-20 ENCOUNTER — Other Ambulatory Visit: Payer: Self-pay

## 2018-11-20 DIAGNOSIS — H814 Vertigo of central origin: Secondary | ICD-10-CM

## 2018-12-02 ENCOUNTER — Telehealth: Payer: Self-pay

## 2018-12-02 ENCOUNTER — Telehealth: Payer: Self-pay | Admitting: Internal Medicine

## 2018-12-02 NOTE — Telephone Encounter (Signed)
Pt returned call and message given to her regarding her MRI, which stated was normal. Pt voiced understanding.

## 2018-12-02 NOTE — Telephone Encounter (Signed)
Called pt, LVM.   MRI was normal, no issues found.   Copied from [place] [id number]. Topic: General - Other >> Nov 26, 2018 11:11 AM Carolyn Stare wrote:  Pt would like a call back about her MRI >> Dec 01, 2018  3:58 PM [place], [place] D wrote: Pt called again requesting results of MRI from 11/20/18. She state she does not use MyChart and has mentioned this before. Requesting callback as soon as possible. Please advise.

## 2018-12-02 NOTE — Telephone Encounter (Signed)
NOTED

## 2019-01-19 ENCOUNTER — Encounter: Payer: Self-pay | Admitting: Internal Medicine

## 2019-01-19 ENCOUNTER — Other Ambulatory Visit: Payer: Self-pay

## 2019-01-19 ENCOUNTER — Ambulatory Visit (INDEPENDENT_AMBULATORY_CARE_PROVIDER_SITE_OTHER): Payer: Medicare HMO | Admitting: Internal Medicine

## 2019-01-19 DIAGNOSIS — R21 Rash and other nonspecific skin eruption: Secondary | ICD-10-CM | POA: Diagnosis not present

## 2019-01-19 MED ORDER — METHYLPREDNISOLONE 4 MG PO TBPK: ORAL_TABLET | ORAL | 0 refills | Status: DC
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
## 2019-01-19 NOTE — Assessment & Plan Note (Addendum)
Rash after yard work - on back - both sides  ? Bites vs plant dermatitis Taking zyrtec, will continue Medrol dose pak Can apply topical benadryl cream   Call if no improvement

## 2019-01-19 NOTE — Patient Instructions (Addendum)
Continue the zyrtec.  Apply benadryl cream..   Take the oral steroids as prescribed.     Please call if there is no improvement in your symptoms.

## 2019-01-20 ENCOUNTER — Telehealth: Payer: Self-pay

## 2019-01-20 NOTE — Telephone Encounter (Signed)
Copied from [place] [id number]. Topic: General - Other >> Jan 20, 2019  3:27 PM [place], Ja-Kwan wrote: Reason for CRM: Pt stated she began taking an immune support supplement today and she would like to know if Dr. [name] Reichmann thinks it is ok. Pt requests call back.

## 2019-01-20 NOTE — Telephone Encounter (Signed)
I dont really know of any immune support supplement that is approved by the FDA, so I really could not say.  If I were pressed for more, I would say it probably does not need to be taken due to unproven efficacy and this is often a way for unscrupulous people to make a dollar

## 2019-01-21 NOTE — Telephone Encounter (Signed)
Pt has been informed.

## 2019-01-22 ENCOUNTER — Other Ambulatory Visit (INDEPENDENT_AMBULATORY_CARE_PROVIDER_SITE_OTHER): Payer: Medicare HMO

## 2019-01-22 ENCOUNTER — Telehealth: Payer: Self-pay | Admitting: Internal Medicine

## 2019-01-22 DIAGNOSIS — E559 Vitamin D deficiency, unspecified: Secondary | ICD-10-CM | POA: Diagnosis not present

## 2019-01-22 DIAGNOSIS — E538 Deficiency of other specified B group vitamins: Secondary | ICD-10-CM | POA: Diagnosis not present

## 2019-01-22 DIAGNOSIS — R7303 Prediabetes: Secondary | ICD-10-CM

## 2019-01-22 DIAGNOSIS — Z0000 Encounter for general adult medical examination without abnormal findings: Secondary | ICD-10-CM | POA: Diagnosis not present

## 2019-01-22 LAB — CBC WITH DIFFERENTIAL/PLATELET
Basophils Absolute: 0.1 10*3/uL (ref 0.0–0.1)
Basophils Relative: 0.5 % (ref 0.0–3.0)
Eosinophils Absolute: 0 10*3/uL (ref 0.0–0.7)
Eosinophils Relative: 0.2 % (ref 0.0–5.0)
HCT: 40.8 % (ref 36.0–46.0)
Hemoglobin: 13.5 g/dL (ref 12.0–15.0)
Lymphocytes Relative: 38.8 % (ref 12.0–46.0)
Lymphs Abs: 4.1 10*3/uL — ABNORMAL HIGH (ref 0.7–4.0)
MCHC: 33 g/dL (ref 30.0–36.0)
MCV: 85.7 fl (ref 78.0–100.0)
Monocytes Absolute: 0.8 10*3/uL (ref 0.1–1.0)
Monocytes Relative: 7.5 % (ref 3.0–12.0)
Neutro Abs: 5.6 10*3/uL (ref 1.4–7.7)
Neutrophils Relative %: 53 % (ref 43.0–77.0)
Platelets: 349 10*3/uL (ref 150.0–400.0)
RBC: 4.77 Mil/uL (ref 3.87–5.11)
RDW: 14 % (ref 11.5–15.5)
WBC: 10.6 10*3/uL — ABNORMAL HIGH (ref 4.0–10.5)

## 2019-01-22 LAB — URINALYSIS, ROUTINE W REFLEX MICROSCOPIC
Bilirubin Urine: NEGATIVE
Hgb urine dipstick: NEGATIVE
Ketones, ur: NEGATIVE
Nitrite: NEGATIVE
Specific Gravity, Urine: 1.025 (ref 1.000–1.030)
Total Protein, Urine: NEGATIVE
Urine Glucose: NEGATIVE
Urobilinogen, UA: 0.2 (ref 0.0–1.0)
pH: 6 (ref 5.0–8.0)

## 2019-01-22 LAB — VITAMIN D 25 HYDROXY (VIT D DEFICIENCY, FRACTURES): VITD: 36.22 ng/mL (ref 30.00–100.00)

## 2019-01-22 LAB — HEMOGLOBIN A1C: Hgb A1c MFr Bld: 6.1 % (ref 4.6–6.5)

## 2019-01-22 LAB — BASIC METABOLIC PANEL
BUN: 19 mg/dL (ref 6–23)
CO2: 28 mEq/L (ref 19–32)
Calcium: 10 mg/dL (ref 8.4–10.5)
Chloride: 103 mEq/L (ref 96–112)
Creatinine, Ser: 0.93 mg/dL (ref 0.40–1.20)
GFR: 59.11 mL/min — ABNORMAL LOW (ref >60.00)
Glucose, Bld: 92 mg/dL (ref 70–99)
Potassium: 3.7 mEq/L (ref 3.5–5.1)
Sodium: 141 mEq/L (ref 135–145)

## 2019-01-22 LAB — HEPATIC FUNCTION PANEL
ALT: 20 U/L (ref 0–35)
AST: 16 U/L (ref 0–37)
Albumin: 4.4 g/dL (ref 3.5–5.2)
Alkaline Phosphatase: 73 U/L (ref 39–117)
Bilirubin, Direct: 0 mg/dL (ref 0.0–0.3)
Total Bilirubin: 0.5 mg/dL (ref 0.2–1.2)
Total Protein: 7.5 g/dL (ref 6.0–8.3)

## 2019-01-22 LAB — LIPID PANEL
Cholesterol: 295 mg/dL — ABNORMAL HIGH (ref 0–200)
HDL: 54.4 mg/dL
LDL Cholesterol: 203 mg/dL — ABNORMAL HIGH (ref 0–99)
NonHDL: 240.33
Total CHOL/HDL Ratio: 5
Triglycerides: 186 mg/dL — ABNORMAL HIGH (ref 0.0–149.0)
VLDL: 37.2 mg/dL (ref 0.0–40.0)

## 2019-01-22 LAB — VITAMIN B12: Vitamin B-12: >1500 pg/mL — ABNORMAL HIGH (ref 211–911)

## 2019-01-22 LAB — TSH: TSH: 3.35 u[IU]/mL (ref 0.35–4.50)

## 2019-01-23 ENCOUNTER — Telehealth: Payer: Self-pay

## 2019-01-23 NOTE — Telephone Encounter (Signed)
Pt has been informed of results and expressed understanding.  °

## 2019-01-23 NOTE — Telephone Encounter (Signed)
-----   Message from Biagio Borg, MD sent at 01/22/2019 12:31 PM EDT ----- Pt has appt aug 6  Pre-visit labs show urine test that could be consistent with infection, but only if she has symptoms -   Has there been fever, pain or other unusual urinary symptoms?

## 2019-01-27 ENCOUNTER — Encounter: Payer: Self-pay | Admitting: Internal Medicine

## 2019-01-27 MED ORDER — CIPROFLOXACIN HCL 500 MG PO TABS: 500.0000 mg | ORAL_TABLET | Freq: Two times a day (BID) | ORAL | 0 refills | Status: AC

## 2019-02-05 ENCOUNTER — Encounter: Payer: Self-pay | Admitting: Internal Medicine

## 2019-02-05 ENCOUNTER — Other Ambulatory Visit: Payer: Self-pay

## 2019-02-05 ENCOUNTER — Ambulatory Visit (INDEPENDENT_AMBULATORY_CARE_PROVIDER_SITE_OTHER): Payer: Medicare HMO | Admitting: Internal Medicine

## 2019-02-05 VITALS — BP 136/84 | HR 82 | Temp 98.3°F | Ht 62.0 in | Wt 193.0 lb

## 2019-02-05 DIAGNOSIS — E559 Vitamin D deficiency, unspecified: Secondary | ICD-10-CM | POA: Diagnosis not present

## 2019-02-05 DIAGNOSIS — R0789 Other chest pain: Secondary | ICD-10-CM

## 2019-02-05 DIAGNOSIS — I679 Cerebrovascular disease, unspecified: Secondary | ICD-10-CM | POA: Diagnosis not present

## 2019-02-05 DIAGNOSIS — Z0001 Encounter for general adult medical examination with abnormal findings: Secondary | ICD-10-CM | POA: Diagnosis not present

## 2019-02-05 DIAGNOSIS — E538 Deficiency of other specified B group vitamins: Secondary | ICD-10-CM

## 2019-02-05 DIAGNOSIS — R079 Chest pain, unspecified: Secondary | ICD-10-CM | POA: Diagnosis not present

## 2019-02-05 DIAGNOSIS — E785 Hyperlipidemia, unspecified: Secondary | ICD-10-CM

## 2019-02-05 DIAGNOSIS — R7303 Prediabetes: Secondary | ICD-10-CM | POA: Diagnosis not present

## 2019-02-05 HISTORY — DX: Cerebrovascular disease, unspecified: I67.9

## 2019-02-05 MED ORDER — ROSUVASTATIN CALCIUM 20 MG PO TABS: 20.0000 mg | ORAL_TABLET | Freq: Every day | ORAL | 3 refills | Status: DC

## 2019-02-05 NOTE — Patient Instructions (Signed)
Your EKG was Ok today  You will be contacted regarding the referral for: stress test and echocardiogram  Please take all new medication as prescribed - the crestor 20 mg per day  Please continue all other medications as before, and refills have been done if requested.  Please have the pharmacy call with any other refills you may need.  Please continue your efforts at being more active, low cholesterol diet, and weight control.  You are otherwise up to date with prevention measures today.  Please keep your appointments with your specialists as you may have planned  Please return in 6 months, or sooner if needed, with Lab testing done 3-5 days before

## 2019-02-05 NOTE — Progress Notes (Signed)
Subjective:    Patient ID: Debra Atkinson, female    DOB: 1946-03-27, 73 y.o.   MRN: [id number]  HPI  Here for wellness and f/u;.  Pt denies neurological change such as new headache, facial or extremity weakness.  Pt denies polydipsia, polyuria, or low sugar symptoms. Pt states overall good compliance with treatment and medications, good tolerability, and has been trying to follow appropriate diet.  Pt denies worsening depressive symptoms, suicidal ideation or panic. No fever, night sweats, wt loss, loss of appetite, or other constitutional symptoms.  Pt states good ability with ADL's, has low fall risk, home safety reviewed and adequate, no other significant changes in hearing or vision, and only occasionally active with exercise. Also, has been wary of statin, but now willing to start.  Also c/o left CP mild pressure like intermittent, without radiation, diaphoresis, n/v, sob, dizziness or syncope, but has had palpitations with fluttering like for several months.   Past Medical History:  Diagnosis Date  . Cerebrovascular disease 02/05/2019  . Concussion '06, '11  . Diabetes mellitus    diet management  . Family history of colon cancer 02/28/2016  . Fibromyalgia 10/08/2013  . Genital warts    treated podophyllin  . Hyperlipidemia    diet managed  . Varicella    Past Surgical History:  Procedure Laterality Date  . APPENDECTOMY  1979  . [place]  . CHOLECYSTECTOMY  1979   laparotomy    reports that she has never smoked. She has never used smokeless tobacco. She reports that she does not drink alcohol or use drugs. family history includes COPD in her mother; Cancer (age of onset: 14) in her mother; Diabetes in her maternal grandfather and mother; Heart disease in her father and mother; Rheumatic fever in her father. Allergies  Allergen Reactions  . Prednisone Itching    Redness from injection   Current Outpatient Medications on File Prior to Visit  Medication Sig  Dispense Refill  . cetirizine (ZYRTEC) 10 MG tablet TAKE 1 TABLET BY MOUTH DAILY 30 tablet 5  . Cholecalciferol (CVS D3) 2000 units CAPS Take by mouth.    . Cyanocobalamin (CVS VITAMIN B-12 PO) Take 2,500 tablets by mouth daily.     . diazepam (VALIUM) 5 MG tablet Take 1 tablet (5 mg total) by mouth every 12 (twelve) hours as needed for anxiety. 10 tablet 0  . ibuprofen (ADVIL,MOTRIN) 200 MG tablet Take 200 mg by mouth every 6 (six) hours as needed.    . meclizine (ANTIVERT) 25 MG tablet Take 1 tablet (25 mg total) by mouth 3 (three) times daily as needed for dizziness. 60 tablet 3  . methylPREDNISolone (MEDROL) 4 MG TBPK tablet 4 tab by mouth x 3day,2tab x 3day,1tab x 3day 21 tablet 0  . tiZANidine (ZANAFLEX) 4 MG tablet Take 1 tablet (4 mg total) by mouth every 6 (six) hours as needed for muscle spasms. 30 tablet 1  . triamcinolone (NASACORT AQ) 55 MCG/ACT AERO nasal inhaler Place 2 sprays into the nose daily. 3 Inhaler 3   No current facility-administered medications on file prior to visit.    Review of Systems Constitutional: Negative for other unusual diaphoresis, sweats, appetite or weight changes HENT: Negative for other worsening hearing loss, ear pain, facial swelling, mouth sores or neck stiffness.   Eyes: Negative for other worsening pain, redness or other visual disturbance.  Respiratory: Negative for other stridor or swelling Cardiovascular: Negative for other palpitations or other chest pain  Gastrointestinal: Negative for worsening diarrhea or loose stools, blood in stool, distention or other pain Genitourinary: Negative for hematuria, flank pain or other change in urine volume.  Musculoskeletal: Negative for myalgias or other joint swelling.  Skin: Negative for other color change, or other wound or worsening drainage.  Neurological: Negative for other syncope or numbness. Hematological: Negative for other adenopathy or swelling Psychiatric/Behavioral: Negative for  hallucinations, other worsening agitation, SI, self-injury, or new decreased concentration All other system neg per pt    Objective:   Physical Exam BP 136/84   Pulse 82   Temp 98.3 F (36.8 C) (Oral)   Ht 5\' 2"  (1.575 m)   Wt 193 lb (87.5 kg)   SpO2 96%   BMI 35.30 kg/m  VS noted,  Constitutional: Pt is oriented to person, place, and time. Appears well-developed and well-nourished, in no significant distress and comfortable Head: Normocephalic and atraumatic  Eyes: Conjunctivae and EOM are normal. Pupils are equal, round, and reactive to light Right Ear: External ear normal without discharge Left Ear: External ear normal without discharge Nose: Nose without discharge or deformity Mouth/Throat: Oropharynx is without other ulcerations and moist  Neck: Normal range of motion. Neck supple. No JVD present. No tracheal deviation present or significant neck LA or mass Cardiovascular: Normal rate, regular rhythm, normal heart sounds and intact distal pulses.   Pulmonary/Chest: WOB normal and breath sounds without rales or wheezing  Abdominal: Soft. Bowel sounds are normal. NT. No HSM  Musculoskeletal: Normal range of motion. Exhibits no edema Lymphadenopathy: Has no other cervical adenopathy.  Neurological: Pt is alert and oriented to person, place, and time. Pt has normal reflexes. No cranial nerve deficit. Motor grossly intact, Gait intact Skin: Skin is warm and dry. No rash noted or new ulcerations Psychiatric:  Has normal mood and affect. Behavior is normal without agitation No other exam findings  ECG today I have personally interpreted - NSR 76    Assessment & Plan:

## 2019-02-08 ENCOUNTER — Encounter: Payer: Self-pay | Admitting: Internal Medicine

## 2019-02-08 NOTE — Assessment & Plan Note (Signed)

## 2019-02-08 NOTE — Assessment & Plan Note (Signed)
Also for f/u vit d level

## 2019-02-08 NOTE — Assessment & Plan Note (Signed)
Also for f/u b12 level

## 2019-02-08 NOTE — Assessment & Plan Note (Signed)
stable overall by history and exam, recent data reviewed with pt, and pt to continue medical treatment as before,  to f/u any worsening symptoms or concerns], cont asa 81

## 2019-02-08 NOTE — Assessment & Plan Note (Addendum)
Atpyical, ecg reviewed, for stress test, cont same tx,  to f/u any worsening symptoms or concerns  In addition to the time spent performing CPE, I spent an additional 25 minutes face to face,in which greater than 50% of this time was spent in counseling and coordination of care for patient's acute illness as documented, including the differential dx, treatment, further evaluation and other management of chest pain, vit d def, vit b12 def, preDM, HLD, and cerebrovascular dz

## 2019-02-08 NOTE — Assessment & Plan Note (Signed)
stable overall by history and exam, recent data reviewed with pt, and pt to continue medical treatment as before,  to f/u any worsening symptoms or concerns  

## 2019-02-08 NOTE — Assessment & Plan Note (Signed)
Mild uncontrolled, for crestor 20 qd

## 2019-02-12 ENCOUNTER — Other Ambulatory Visit: Payer: Self-pay

## 2019-02-12 ENCOUNTER — Telehealth (HOSPITAL_COMMUNITY): Payer: Self-pay | Admitting: *Deleted

## 2019-02-12 ENCOUNTER — Ambulatory Visit (HOSPITAL_COMMUNITY): Payer: Medicare HMO | Attending: Cardiovascular Disease

## 2019-02-12 DIAGNOSIS — R079 Chest pain, unspecified: Secondary | ICD-10-CM

## 2019-02-12 NOTE — Telephone Encounter (Signed)
Patient given detailed instructions per Myocardial Perfusion Study Information Sheet for the test on 02/16/19 at 10:00. Patient notified to arrive 15 minutes early and that it is imperative to arrive on time for appointment to keep from having the test rescheduled.  If you need to cancel or reschedule your appointment, please call the office within 24 hours of your appointment. . Patient verbalized understanding.Veronia Beets

## 2019-02-13 ENCOUNTER — Other Ambulatory Visit (HOSPITAL_COMMUNITY): Payer: Medicare HMO

## 2019-02-16 ENCOUNTER — Ambulatory Visit (HOSPITAL_COMMUNITY): Payer: Medicare HMO | Attending: Cardiology

## 2019-02-16 ENCOUNTER — Other Ambulatory Visit: Payer: Self-pay

## 2019-02-16 DIAGNOSIS — R079 Chest pain, unspecified: Secondary | ICD-10-CM | POA: Diagnosis not present

## 2019-02-16 LAB — MYOCARDIAL PERFUSION IMAGING
LV dias vol: 56 mL (ref 46–106)
LV sys vol: 17 mL
Peak HR: 110 {beats}/min
Rest HR: 76 {beats}/min
SDS: 1
SRS: 0
SSS: 1
TID: 1.03

## 2019-02-16 MED ORDER — REGADENOSON 0.4 MG/5ML IV SOLN
0.4000 mg | Freq: Once | INTRAVENOUS | Status: AC
  Administered 2019-02-16: 0.4 mg via INTRAVENOUS

## 2019-02-16 MED ORDER — TECHNETIUM TC 99M TETROFOSMIN IV KIT
10.2000 | PACK | Freq: Once | INTRAVENOUS | Status: AC | PRN
  Administered 2019-02-16: 10.2 via INTRAVENOUS
  Filled 2019-02-16: qty 11

## 2019-02-16 MED ORDER — TECHNETIUM TC 99M TETROFOSMIN IV KIT
31.4000 | PACK | Freq: Once | INTRAVENOUS | Status: AC | PRN
  Administered 2019-02-16: 31.4 via INTRAVENOUS
  Filled 2019-02-16: qty 32

## 2019-03-12 ENCOUNTER — Encounter (HOSPITAL_COMMUNITY): Payer: Self-pay | Admitting: Emergency Medicine

## 2019-03-12 ENCOUNTER — Ambulatory Visit: Payer: Self-pay | Admitting: *Deleted

## 2019-03-12 ENCOUNTER — Emergency Department (HOSPITAL_COMMUNITY): Payer: Medicare HMO

## 2019-03-12 ENCOUNTER — Emergency Department (HOSPITAL_COMMUNITY)
Admission: EM | Admit: 2019-03-12 | Discharge: 2019-03-12 | Disposition: A | Discharge status: Home / Self Care | Payer: Medicare HMO | Attending: Emergency Medicine | Admitting: Emergency Medicine

## 2019-03-12 DIAGNOSIS — Y92810 Car as the place of occurrence of the external cause: Secondary | ICD-10-CM | POA: Insufficient documentation

## 2019-03-12 DIAGNOSIS — Z8673 Personal history of transient ischemic attack (TIA), and cerebral infarction without residual deficits: Secondary | ICD-10-CM | POA: Insufficient documentation

## 2019-03-12 DIAGNOSIS — Y999 Unspecified external cause status: Secondary | ICD-10-CM | POA: Insufficient documentation

## 2019-03-12 DIAGNOSIS — Z79899 Other long term (current) drug therapy: Secondary | ICD-10-CM | POA: Diagnosis not present

## 2019-03-12 DIAGNOSIS — S0003XA Contusion of scalp, initial encounter: Secondary | ICD-10-CM | POA: Insufficient documentation

## 2019-03-12 DIAGNOSIS — E119 Type 2 diabetes mellitus without complications: Secondary | ICD-10-CM | POA: Diagnosis not present

## 2019-03-12 DIAGNOSIS — W2209XA Striking against other stationary object, initial encounter: Secondary | ICD-10-CM | POA: Insufficient documentation

## 2019-03-12 DIAGNOSIS — Y9389 Activity, other specified: Secondary | ICD-10-CM | POA: Insufficient documentation

## 2019-03-12 DIAGNOSIS — R51 Headache: Secondary | ICD-10-CM | POA: Diagnosis not present

## 2019-03-12 DIAGNOSIS — S0990XA Unspecified injury of head, initial encounter: Secondary | ICD-10-CM | POA: Diagnosis present

## 2019-03-12 NOTE — ED Triage Notes (Signed)
Patient reports hitting the right side of her head on car door last Friday. States she was sent by her PCP for CT scan due to head tenderness still. Denies any vision changes or other issues.

## 2019-03-12 NOTE — Telephone Encounter (Signed)
Pt called requesting an appointment regarding when she hit her head on the corner of a car door last Friday. She denies bleeding. The hit was at her temper on the right side. The site is tender  And sometimes her right eye will get watery and run. The pain is felt from her ear to her eye. The area is red. She denies memory loss or any neurological symptoms. Not on a blood thinner. Per protocol she would need to be seen in the ED. She voiced understanding. Notified [facility].  Reason for Disposition . [1] Age over 33 years AND [2] swelling or bruise  Answer Assessment - Initial Assessment Questions 1. MECHANISM: "How did the injury happen?" For falls, ask: "What height did you fall from?" and "What surface did you fall against?"      Hit head against sharp corner of a car door 2. ONSET: "When did the injury happen?" (Minutes or hours ago)      Last Friday 3. NEUROLOGIC SYMPTOMS: "Was there any loss of consciousness?" "Are there any other neurological symptoms?"      no 4. MENTAL STATUS: "Does the person know who he is, who you are, and where he is?"      yes 5. LOCATION: "What part of the head was hit?"      Right side just a temper 6. SCALP APPEARANCE: "What does the scalp look like? Is it bleeding now?" If so, ask: "Is it difficult to stop?"      No bleeding, red now 7. SIZE: For cuts, bruises, or swelling, ask: "How large is it?" (e.g., inches or centimeters)     Swelling now and can feel a knot there 8. PAIN: "Is there any pain?" If so, ask: "How bad is it?"  (e.g., Scale 1-10; or mild, moderate, severe)     More tender and discomfort  9. TETANUS: For any breaks in the skin, ask: "When was the last tetanus booster?"     No breaks in the skin 10. OTHER SYMPTOMS: "Do you have any other symptoms?" (e.g., neck pain, vomiting)       no 11. PREGNANCY: "Is there any chance you are pregnant?" "When was your last menstrual period?"       n/a  Protocols used: HEAD  INJURY-A-AH

## 2019-03-12 NOTE — Telephone Encounter (Signed)
Noted  

## 2019-03-12 NOTE — Discharge Instructions (Addendum)
Please follow up with your PCP regarding your ED visit today Take Tylenol as needed for your pain. You may also place ice on area for comfort. Please see attached info on scalp hematomas.

## 2019-03-12 NOTE — ED Provider Notes (Signed)
[facility] EMERGENC[facility] DEPARTMENT Provider Note   CSN: [id number] Arrival date & time: 03/12/19  1242     History   Chief Complaint Chief Complaint  Patient presents with  . Head Injury    HPI Debra Atkinson is a 73 y.o. female with PMHx fibromyalgia, diabetes, concussion who presents to the ED after being sent from PCPs office.  Patient reports on Friday 09/04 when she was getting out of her car and accidentally hit the right side of her head on the top of the car door.  No loss of consciousness.  No wound.  Patient states that since then she has had a "knot" to this area with pain.  She states she has been taking 400 mg ibuprofen as needed that relief.  Patient called her PCP today to be evaluated for this pain and they told her she needs to come to the ED immediately for CT head.  Patient is not anticoagulated.  Denies vision changes, confusion, speech difficulties, unilateral weakness or numbness, other associated symptoms.       Past Medical History:  Diagnosis Date  . Cerebrovascular disease 02/05/2019  . Concussion '06, '11  . Diabetes mellitus    diet management  . Family history of colon cancer 02/28/2016  . Fibromyalgia 10/08/2013  . Genital warts    treated podophyllin  . Hyperlipidemia    diet managed  . Varicella     Patient Active Problem List   Diagnosis Date Noted  . Cerebrovascular disease 02/05/2019  . Rash and nonspecific skin eruption 01/19/2019  . Right knee pain 09/01/2018  . Great toe pain, right 09/01/2018  . Acute pharyngitis 02/24/2018  . Splinter in skin 01/22/2018  . Right serous otitis media 11/28/2017  . Vertigo 11/28/2017  . Bilateral leg pain 11/28/2017  . Dysuria 07/06/2017  . B12 deficiency 05/07/2017  . Vitamin D deficiency 05/07/2017  . Fatigue 02/13/2017  . Lateral epicondylitis of left elbow 10/31/2016  . Toe pain, left 10/31/2016  . Posterior neck pain 10/31/2016  . Degenerative arthritis of knee, bilateral  07/30/2016  . Thyroid nodule 06/27/2016  . Pain in both knees 06/27/2016  . Mass of right side of neck 04/29/2016  . Dizziness 03/07/2016  . Family history of colon cancer 02/28/2016  . Constipation 02/28/2016  . UTI (urinary tract infection) 07/29/2015  . Cough 05/05/2015  . BPV (benign positional vertigo) 11/09/2014  . Atypical chest pain 10/08/2014  . Upper airway cough syndrome 06/08/2014  . Sinusitis, chronic 04/09/2014  . Allergic conjunctivitis 04/09/2014  . Urinary frequency 02/18/2014  . Eustachian tube dysfunction 12/04/2013  . Fibromyalgia 10/08/2013  . Low back pain 09/18/2012  . Allergic rhinitis 09/18/2012  . Need for prophylactic vaccination and inoculation against influenza 04/28/2012  . Encounter for well adult exam with abnormal findings 04/28/2012  . Pre-diabetes 04/25/2011  . Hyperlipidemia     Past Surgical History:  Procedure Laterality Date  . APPENDECTOMY  1979  . [place]  . CHOLECYSTECTOMY  1979   laparotomy     OB History   No obstetric history on file.      Home Medications    Prior to Admission medications   Medication Sig Start Date End Date Taking? Authorizing Provider  cetirizine (ZYRTEC) 10 MG tablet TAKE 1 TABLET BY MOUTH DAILY 07/23/18   Biagio Borg, MD  Cholecalciferol ([employer] D3) 2000 units CAPS Take by mouth.    [provider]  Cyanocobalamin ([employer]S VITAMIN B-12 PO)  Take 2,500 tablets by mouth daily.     [provider]  diazepam (VALIUM) 5 MG tablet Take 1 tablet (5 mg total) by mouth every 12 (twelve) hours as needed for anxiety. 10/06/18   Biagio Borg, MD  ibuprofen (ADVIL,MOTRIN) 200 MG tablet Take 200 mg by mouth every 6 (six) hours as needed.    [provider]  meclizine (ANTIVERT) 25 MG tablet Take 1 tablet (25 mg total) by mouth 3 (three) times daily as needed for dizziness. 11/07/18   Biagio Borg, MD  methylPREDNISolone (MEDROL) 4 MG TBPK tablet 4 tab by mouth x 3day,2tab x 3day,1tab  x 3day 01/19/19   Binnie Rail, MD  rosuvastatin (CRESTOR) 20 MG tablet Take 1 tablet (20 mg total) by mouth daily. 02/05/19   Biagio Borg, MD  tiZANidine (ZANAFLEX) 4 MG tablet Take 1 tablet (4 mg total) by mouth every 6 (six) hours as needed for muscle spasms. 10/31/16   Biagio Borg, MD  triamcinolone (NASACORT AQ) 55 MCG/ACT AERO nasal inhaler Place 2 sprays into the nose daily. 11/07/18   Biagio Borg, MD    Family History Family History  Problem Relation Age of Onset  . Diabetes Mother   . Heart disease Mother        CHF  . Cancer Mother 20       colon cancer  . COPD Mother   . Heart disease Father        CAD/MI  . Rheumatic fever Father   . Diabetes Maternal Grandfather     Social History Social History   Tobacco Use  . Smoking status: Never Smoker  . Smokeless tobacco: Never Used  Substance Use Topics  . Alcohol use: No    Alcohol/week: 0.0 standard drinks  . Drug use: No     Allergies   Prednisone   Review of Systems Review of Systems  Constitutional: Negative for chills and fever.  HENT: Negative for congestion.   Eyes: Negative for visual disturbance.  Respiratory: Negative for cough and shortness of breath.   Cardiovascular: Negative for chest pain and leg swelling.  Gastrointestinal: Negative for abdominal distention, nausea and vomiting.  Genitourinary: Negative for difficulty urinating.  Musculoskeletal: Negative for myalgias, neck pain and neck stiffness.  Skin: Negative for wound.  Neurological: Positive for headaches.     Physical Exam Updated Vital Signs BP 124/89 (BP Location: Left Arm)   Pulse 78   Temp 98.4 F (36.9 C) (Oral)   Resp 18   SpO2 99%   Physical Exam Vitals signs and nursing note reviewed.  Constitutional:      Appearance: She is not ill-appearing.  HENT:     Head: Normocephalic.     Comments: Small hematoma noted to right temporal region. No raccoon sign or battle sign.  Negative hemotympanum bilaterally.  Eyes:      Extraocular Movements: Extraocular movements intact.     Conjunctiva/sclera: Conjunctivae normal.     Pupils: Pupils are equal, round, and reactive to light.  Neck:     Musculoskeletal: Neck supple.  Cardiovascular:     Rate and Rhythm: Normal rate and regular rhythm.  Pulmonary:     Effort: Pulmonary effort is normal.     Breath sounds: Normal breath sounds.  Abdominal:     Palpations: Abdomen is soft.     Tenderness: There is no abdominal tenderness.  Musculoskeletal:     Comments: No C, T, or L midline spinal tenderness  Skin:  General: Skin is warm and dry.  Neurological:     Mental Status: She is alert.     Comments: CN 3-12 grossly intact A&O x4 GCS 15 Sensation and strength intact Coordination with finger-to-nose WNL Neg romberg, neg pronator drift       ED Treatments / Results  Labs (all labs ordered are listed, but only abnormal results are displayed) Labs Reviewed - No data to display  EKG None  Radiology No results found.  Procedures Procedures (including critical care time)  Medications Ordered in ED Medications - No data to display   Initial Impression / Assessment and Plan / ED Course  I have reviewed the triage vital signs and the nursing notes.  Pertinent labs & imaging results that were available during my care of the patient were reviewed by me and considered in my medical decision making (see chart for details).    73 year old female who presents to the ED status post head injury on 09/04.  Patient hit her head against the car door.  No loss of consciousness.  No wound.  Has been having some pain to the area since then.  Sent to the ED by PCP for CT head.  Patient is not anticoagulated. No Focal neuro deficits on exam.  No signs of basilar skull fracture.  No tenderness to midline C-spine.  CT had ordered given referral from PCP although do not suspect any abnormalities today.  Do not feel patient needs blood work at this time.  If no acute  findings on CT head patient can be discharged home.   4:42 PM Case signed out to oncoming EDP Dr. Kathrynn Humble who will discharge patient once CT head returns if no acute findings.       Final Clinical Impressions(s) / ED Diagnoses   Final diagnoses:  Scalp hematoma, initial encounter    ED Discharge Orders    None       Eustaquio Maize, PA-C 03/12/19 1642    Sherwood Gambler, MD 03/13/19 [id number]

## 2019-03-12 NOTE — ED Notes (Signed)
Patient verbalizes understanding of discharge instructions. Opportunity for questioning and answering were provided.  patient discharged from ED.  

## 2019-04-13 ENCOUNTER — Ambulatory Visit: Payer: Medicare HMO | Admitting: Internal Medicine

## 2019-05-06 ENCOUNTER — Ambulatory Visit: Payer: Medicare HMO

## 2019-05-06 DIAGNOSIS — Z1231 Encounter for screening mammogram for malignant neoplasm of breast: Secondary | ICD-10-CM | POA: Diagnosis not present

## 2019-05-06 LAB — HM MAMMOGRAPHY

## 2019-05-07 ENCOUNTER — Other Ambulatory Visit: Payer: Self-pay

## 2019-05-07 ENCOUNTER — Ambulatory Visit (INDEPENDENT_AMBULATORY_CARE_PROVIDER_SITE_OTHER): Payer: Medicare HMO | Admitting: Internal Medicine

## 2019-05-07 ENCOUNTER — Encounter: Payer: Self-pay | Admitting: Internal Medicine

## 2019-05-07 ENCOUNTER — Ambulatory Visit: Payer: Medicare HMO

## 2019-05-07 VITALS — BP 126/88 | HR 75 | Temp 98.3°F | Ht 62.0 in | Wt 190.0 lb

## 2019-05-07 DIAGNOSIS — R42 Dizziness and giddiness: Secondary | ICD-10-CM

## 2019-05-07 DIAGNOSIS — R7303 Prediabetes: Secondary | ICD-10-CM | POA: Diagnosis not present

## 2019-05-07 DIAGNOSIS — E785 Hyperlipidemia, unspecified: Secondary | ICD-10-CM | POA: Diagnosis not present

## 2019-05-07 DIAGNOSIS — Z23 Encounter for immunization: Secondary | ICD-10-CM

## 2019-05-07 MED ORDER — ROSUVASTATIN CALCIUM 20 MG PO TABS: 20.0000 mg | ORAL_TABLET | Freq: Every day | ORAL | 3 refills | Status: DC

## 2019-05-07 MED ORDER — DIAZEPAM 5 MG PO TABS: 5.0000 mg | ORAL_TABLET | Freq: Two times a day (BID) | ORAL | 2 refills | Status: DC | PRN

## 2019-05-07 NOTE — Assessment & Plan Note (Signed)
stable overall by history and exam, recent data reviewed with pt, and pt to continue medical treatment as before,  to f/u any worsening symptoms or concerns  

## 2019-05-07 NOTE — Patient Instructions (Addendum)
You had the flu shot today  Please continue all other medications as before, including the valium and the crestor  Please have the pharmacy call with any other refills you may need.  Please continue your efforts at being more active, low cholesterol diet, and weight control  Please keep your appointments with your specialists as you may have planned

## 2019-05-07 NOTE — Assessment & Plan Note (Signed)
Ok for meclizine prn,  to f/u any worsening symptoms or concerns 

## 2019-05-07 NOTE — Assessment & Plan Note (Signed)
Ok to try the crestor 20 qd, cont lower chol diet

## 2019-05-07 NOTE — Progress Notes (Signed)
Subjective:    Patient ID: Debra Atkinson, female    DOB: 12-08-1945, 73 y.o.   MRN: [id number]  HPI  Here to f/u; overall doing ok,  Pt denies chest pain, increasing sob or doe, wheezing, orthopnea, PND, increased LE swelling, palpitations, dizziness or syncope.  Pt denies new neurological symptoms such as new headache, or facial or extremity weakness or numbness.  Pt denies polydipsia, polyuria, or low sugar episode.  Pt states overall good compliance with meds, mostly trying to follow appropriate diet, with wt overall stable,  but little exercise however.  Pt denies new neurological symptoms such as new headache, or facial or extremity weakness or numbness, but has recurring head pressure feeling to whole head and occasional vertigo ongoing mild intemittent for many months to years.  Admits to not taking the crestor as older sister have seemed to have trouble and concerned it "can cause cancer" but now willing to try if I can tell her it does not cause cancer Past Medical History:  Diagnosis Date  . Cerebrovascular disease 02/05/2019  . Concussion '06, '11  . Diabetes mellitus    diet management  . Family history of colon cancer 02/28/2016  . Fibromyalgia 10/08/2013  . Genital warts    treated podophyllin  . Hyperlipidemia    diet managed  . Varicella    Past Surgical History:  Procedure Laterality Date  . APPENDECTOMY  1979  . [place]  . CHOLECYSTECTOMY  1979   laparotomy    reports that she has never smoked. She has never used smokeless tobacco. She reports that she does not drink alcohol or use drugs. family history includes COPD in her mother; Cancer (age of onset: 44) in her mother; Diabetes in her maternal grandfather and mother; Heart disease in her father and mother; Rheumatic fever in her father. Allergies  Allergen Reactions  . Prednisone Itching    Redness from injection   Current Outpatient Medications on File Prior to Visit  Medication Sig Dispense  Refill  . cetirizine (ZYRTEC) 10 MG tablet TAKE 1 TABLET BY MOUTH DAILY 30 tablet 5  . Cholecalciferol ([employer]S D3) 2000 units CAPS Take by mouth.    . Cyanocobalamin (CVS VITAMIN B-12 PO) Take 2,500 tablets by mouth daily.     . diazepam (VALIUM) 5 MG tablet Take 1 tablet (5 mg total) by mouth every 12 (twelve) hours as needed for anxiety. 10 tablet 0  . ibuprofen (ADVIL,MOTRIN) 200 MG tablet Take 200 mg by mouth every 6 (six) hours as needed.    . meclizine (ANTIVERT) 25 MG tablet Take 1 tablet (25 mg total) by mouth 3 (three) times daily as needed for dizziness. 60 tablet 3  . methylPREDNISolone (MEDROL) 4 MG TBPK tablet 4 tab by mouth x 3day,2tab x 3day,1tab x 3day 21 tablet 0  . rosuvastatin (CRESTOR) 20 MG tablet Take 1 tablet (20 mg total) by mouth daily. 90 tablet 3  . tiZANidine (ZANAFLEX) 4 MG tablet Take 1 tablet (4 mg total) by mouth every 6 (six) hours as needed for muscle spasms. 30 tablet 1  . triamcinolone (NASACORT AQ) 55 MCG/ACT AERO nasal inhaler Place 2 sprays into the nose daily. 3 Inhaler 3   No current facility-administered medications on file prior to visit.    Review of Systems  Constitutional: Negative for other unusual diaphoresis or sweats HENT: Negative for ear discharge or swelling Eyes: Negative for other worsening visual disturbances Respiratory: Negative for stridor or other swelling  Gastrointestinal: Negative for worsening distension or other blood Genitourinary: Negative for retention or other urinary change Musculoskeletal: Negative for other MSK pain or swelling Skin: Negative for color change or other new lesions Neurological: Negative for worsening tremors and other numbness  Psychiatric/Behavioral: Negative for worsening agitation or other fatigue All otherwise neg per pt     Objective:   Physical Exam BP 126/88   Pulse 75   Temp 98.3 F (36.8 C) (Oral)   Ht 5\' 2"  (1.575 m)   Wt 190 lb (86.2 kg)   SpO2 96%   BMI 34.75 kg/m  VS noted,   Constitutional: Pt appears in NAD HENT: Head: NCAT.  Right Ear: External ear normal.  Left Ear: External ear normal.  Eyes: . Pupils are equal, round, and reactive to light. Conjunctivae and EOM are normal Nose: without d/c or deformity Neck: Neck supple. Gross normal ROM Cardiovascular: Normal rate and regular rhythm.   Pulmonary/Chest: Effort normal and breath sounds without rales or wheezing.  Abd:  Soft, NT, ND, + BS, no organomegaly Neurological: Pt is alert. At baseline orientation, motor grossly intact Skin: Skin is warm. No rashes, other new lesions, no LE edema Psychiatric: Pt behavior is normal without agitation  All otherwise neg per pt Lab Results  Component Value Date   WBC 10.6 (H) 01/22/2019   HGB 13.5 01/22/2019   HCT 40.8 01/22/2019   PLT 349.0 01/22/2019   GLUCOSE 92 01/22/2019   CHOL 295 (H) 01/22/2019   TRIG 186.0 (H) 01/22/2019   HDL 54.40 01/22/2019   LDLDIRECT 189.0 05/08/2018   LDLCALC 203 (H) 01/22/2019   ALT 20 01/22/2019   AST 16 01/22/2019   NA 141 01/22/2019   K 3.7 01/22/2019   CL 103 01/22/2019   CREATININE 0.93 01/22/2019   BUN 19 01/22/2019   CO2 28 01/22/2019   TSH 3.35 01/22/2019   INR 0.92 05/24/2011   HGBA1C 6.1 01/22/2019   MICROALBUR 0.9 10/26/2015      Assessment & Plan:

## 2019-05-07 NOTE — Addendum Note (Signed)
Addended by: Juliet Rude on: 05/07/2019 11:44 AM   Modules accepted: Orders

## 2019-05-13 ENCOUNTER — Ambulatory Visit: Payer: Self-pay | Admitting: *Deleted

## 2019-05-13 MED ORDER — DOXYCYCLINE HYCLATE 100 MG PO TABS: 100.0000 mg | ORAL_TABLET | Freq: Two times a day (BID) | ORAL | 0 refills | Status: DC

## 2019-05-13 NOTE — Telephone Encounter (Signed)
Ok for doxy course - done erx 

## 2019-05-13 NOTE — Addendum Note (Signed)
Addended by: Biagio Borg on: 05/13/2019 03:07 PM   Modules accepted: Orders

## 2019-05-13 NOTE — Telephone Encounter (Signed)
I returned her call.   She gets frequent sinus infections.   She was just in last week and saw Dr. [name] Reichmann for something else and he told her that her throat was red and she had drainage in the back of her throat also fluid behind her ears.    She is requesting he call an Rx in for her since she was just in and she gets these infections often and he usually calls in something for her.  I let her know I would send Dr. [name] Reichmann a note with her symptoms and request for an Rx.   Someone would be in contact with her regarding his decision.   She was agreeable to this.  I sent these notes to the office.  Reason for Disposition . [1] Sinus congestion (pressure, fullness) AND [2] present > 10 days    She gets frequent sinus infections.   She is requesting Dr. [name] Reichmann to call something in.  She was just seen last week for something else.  Answer Assessment - Initial Assessment Questions 1. LOCATION: "Where does it hurt?"      I have sinus infections all the time.    My throat gets sore and red from the post nasal drip.    When I put a Kleenex in my nose I get a little bit of blood but my nose is not bleeding where the blood is running out or anything like that. I feel like my sinuses are infected.   I was in last week and my throat was red and I had drainage in the back of my throat.    I have fluid behind my ears too per Dr. [name] Reichmann. 2. ONSET: "When did the sinus pain start?"  (e.g., hours, days)      No pressure or pain around eyes or in facial area.   3. SEVERITY: "How bad is the pain?"   (Scale 1-10; mild, moderate or severe)   - MILD (1-3): doesn't interfere with normal activities    - MODERATE (4-7): interferes with normal activities (e.g., work or school) or awakens from sleep   - SEVERE (8-10): excruciating pain and patient unable to do any normal activities        No pain 4. RECURRENT SYMPTOM: "Have you ever had sinus problems before?" If so, ask: "When was the last time?" and "What happened that time?"     Yes many many times 5. NASAL CONGESTION: "Is the nose blocked?" If so, ask, "Can you open it or must you breathe through the mouth?"     A little congested 6. NASAL DISCHARGE: "Do you have discharge from your nose?" If so ask, "What color?"     If I put a Kleenex in my nose I get a little bit of blood on the Kleenex but my nose is not running blood or anything like that. 7. FEVER: "Do you have a fever?" If so, ask: "What is it, how was it measured, and when did it start?"      Don't feel like it 8. OTHER SYMPTOMS: "Do you have any other symptoms?" (e.g., sore throat, cough, earache, difficulty breathing)     The fluid behind my ears is causing me to have vertigo.   I always get that when I have a sinus infection.   (Pt mentioned when she answered the phone she was driving.   I asked if she was ok to drive how bad was the vertigo and she said,  "I'm not that  bad".    "I mostly notice it when I'm walking around but sitting it doesn't bother me so much).  I was wondering if he could call something in to clear this up?   He usually does because he knows how often I get these infections. I let her know I would send him a note letting him know of her request and symptoms.   Someone would be in touch with her with his decision.     9. PREGNANCY: "Is there any chance you are pregnant?" "When was your last menstrual period?"     N/A due to age  Protocols used: [place]

## 2019-06-11 ENCOUNTER — Ambulatory Visit (INDEPENDENT_AMBULATORY_CARE_PROVIDER_SITE_OTHER): Payer: Medicare HMO | Admitting: Internal Medicine

## 2019-06-11 DIAGNOSIS — J328 Other chronic sinusitis: Secondary | ICD-10-CM

## 2019-06-11 DIAGNOSIS — J309 Allergic rhinitis, unspecified: Secondary | ICD-10-CM

## 2019-06-11 MED ORDER — LEVOFLOXACIN 500 MG PO TABS: 500.0000 mg | ORAL_TABLET | Freq: Every day | ORAL | 0 refills | Status: AC

## 2019-06-11 MED ORDER — PREDNISONE 10 MG PO TABS: ORAL_TABLET | ORAL | 0 refills | Status: DC

## 2019-06-11 NOTE — Patient Instructions (Signed)
Please take all new medication as prescribed - the antibiotic and prednisone  Please continue all other medications as before, and refills have been done if requested.  Please have the pharmacy call with any other refills you may need.  Please continue your efforts at being more active, low cholesterol diet, and weight control.  Please keep your appointments with your specialists as you may have planned

## 2019-06-11 NOTE — Progress Notes (Signed)
Patient ID: Debra Atkinson, female   DOB: Jan 10, 1946, 73 y.o.   MRN: [id number]   Phone Visit  Cumulative time during 7-day interval 12 min, there was not an associated office visit for this concern within a 7 day period.  Verbal consent for services obtained from patient prior to services given.  Names of all persons present for services: Cathlean Cower, MD, patient  Chief complaint: sinus issues  History, background, results pertinent:   Here with 2-3 days acute onset fever, facial pain, pressure, headache, general weakness and malaise, and greenish d/c, with mild ST and cough, but pt denies chest pain, wheezing, increased sob or doe, orthopnea, PND, increased LE swelling, palpitations, dizziness or syncope.  Also Does have several wks ongoing nasal allergy symptoms with clearish congestion, itch and sneezing, without fever, pain, ST, cough, swelling or wheezing.  Also has urinary urgency with wetting accidents and has f/u with urology for feb 2021, o/w Denies urinary symptoms such as dysuria, frequency, urgency, flank pain, hematuria or n/v, fever, chills. Past Medical History:  Diagnosis Date  . Cerebrovascular disease 02/05/2019  . Concussion '06, '11  . Diabetes mellitus    diet management  . Family history of colon cancer 02/28/2016  . Fibromyalgia 10/08/2013  . Hyperlipidemia    diet managed  . Varicella    No results found for this or any previous visit (from the past 30 hour(s)). Lab Results  Component Value Date   WBC 10.6 (H) 01/22/2019   HGB 13.5 01/22/2019   HCT 40.8 01/22/2019   PLT 349.0 01/22/2019   GLUCOSE 92 01/22/2019   CHOL 295 (H) 01/22/2019   TRIG 186.0 (H) 01/22/2019   HDL 54.40 01/22/2019   LDLDIRECT 189.0 05/08/2018   LDLCALC 203 (H) 01/22/2019   ALT 20 01/22/2019   AST 16 01/22/2019   NA 141 01/22/2019   K 3.7 01/22/2019   CL 103 01/22/2019   CREATININE 0.93 01/22/2019   BUN 19 01/22/2019   CO2 28 01/22/2019   TSH 3.35 01/22/2019   INR 0.92 05/24/2011    HGBA1C 6.1 01/22/2019   MICROALBUR 0.9 10/26/2015    A/P/next steps:   1)  Acute sinus infection - Mild to mod, for antibx course,  to f/u any worsening symptoms or concerns  2)  Allergies - also for predpac asd  3)  Urinary urgency - to f/u urology as planned  Cathlean Cower MD

## 2019-06-13 ENCOUNTER — Encounter: Payer: Self-pay | Admitting: Internal Medicine

## 2019-06-13 NOTE — Assessment & Plan Note (Signed)
See notes

## 2019-07-13 ENCOUNTER — Other Ambulatory Visit: Payer: Self-pay | Admitting: Internal Medicine

## 2019-07-13 ENCOUNTER — Other Ambulatory Visit: Payer: Self-pay

## 2019-07-20 DIAGNOSIS — N3941 Urge incontinence: Secondary | ICD-10-CM | POA: Diagnosis not present

## 2019-07-20 DIAGNOSIS — R8290 Unspecified abnormal findings in urine: Secondary | ICD-10-CM | POA: Diagnosis not present

## 2019-07-27 ENCOUNTER — Telehealth: Payer: Self-pay | Admitting: Internal Medicine

## 2019-07-27 IMAGING — US US THYROID
1 series · 13 of 25 positions shown · non-contrast
Comparison: 06/29/2016

CLINICAL DATA: 71-year-old female with a history of thyroid nodule
follow-up

EXAM:
THYROID ULTRASOUND
TECHNIQUE: Ultrasound examination of the thyroid gland and adjacent soft
tissues was performed.

[Series 1: us thyroid · 0.06mm/px · 13 of 48 slices shown]
[im 1/48]
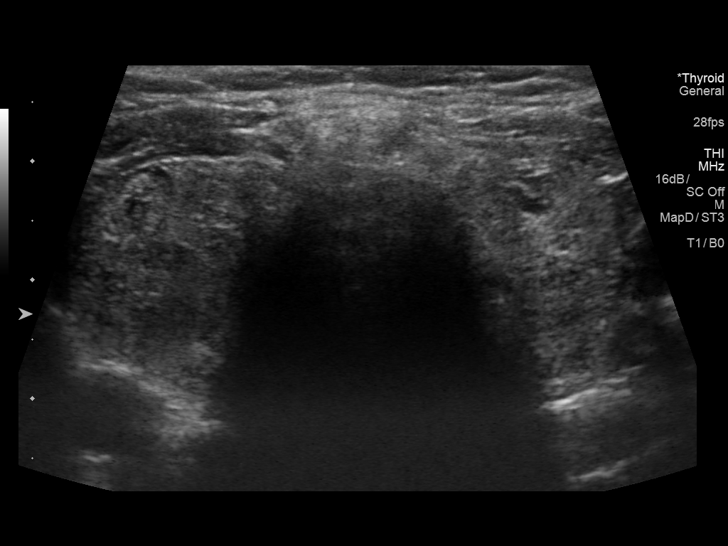
[im 4/48]
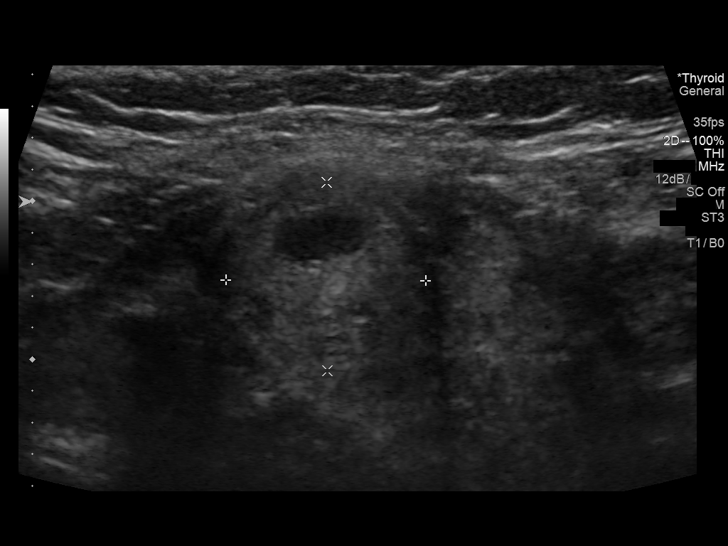
[im 8/48]
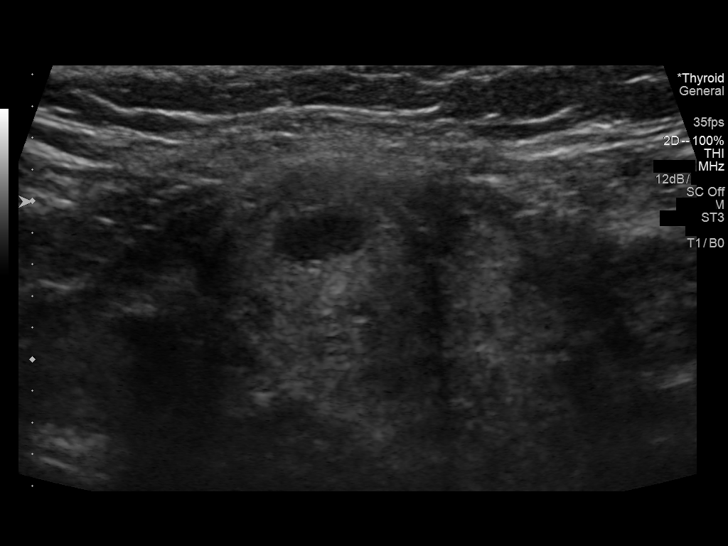
[im 12/48]
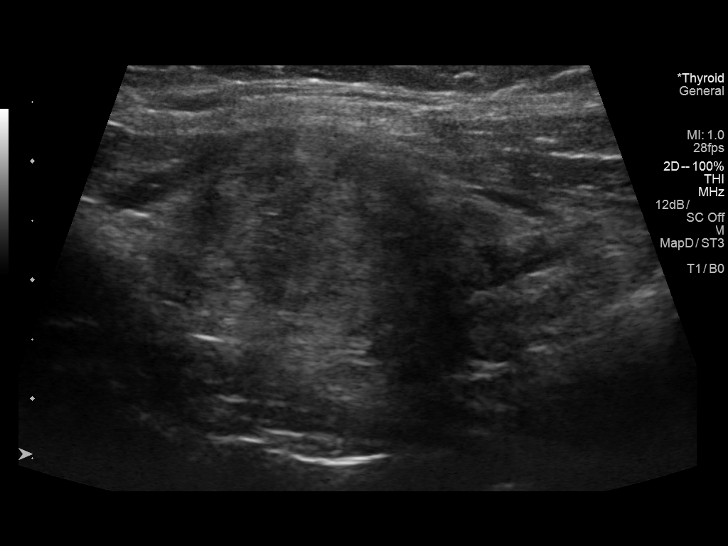
[im 16/48]
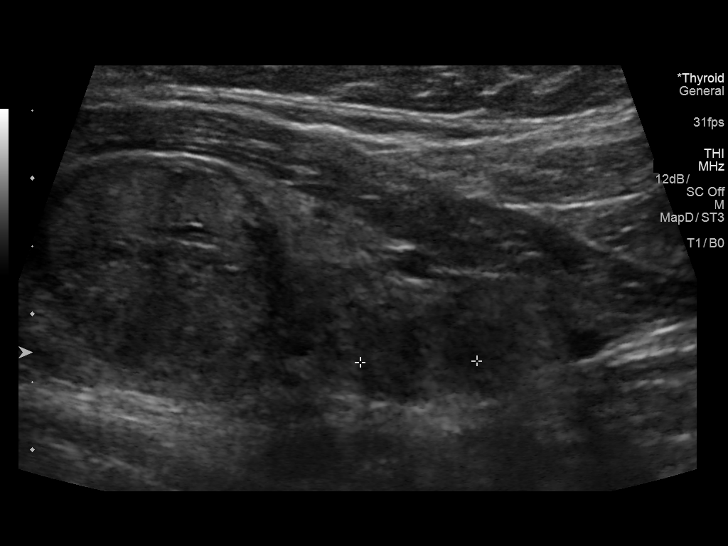
[im 20/48]
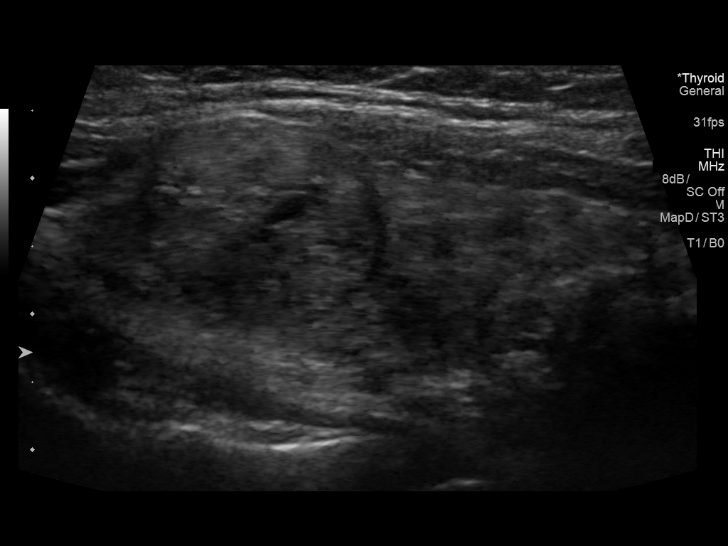
[im 24/48]
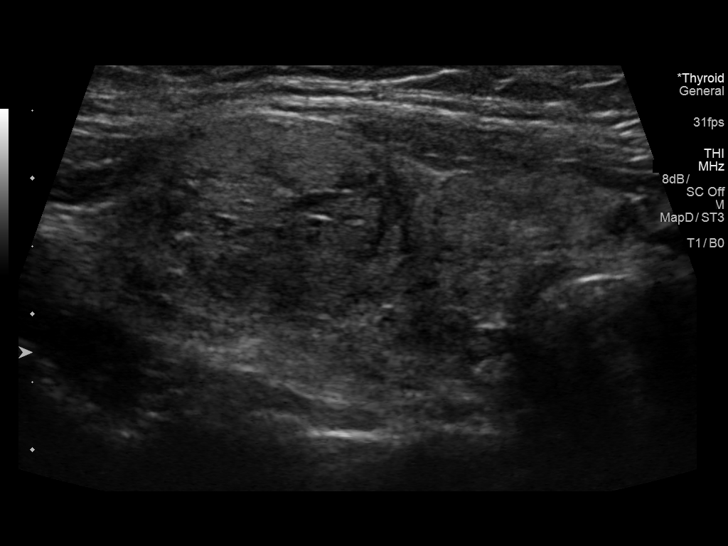
[im 28/48]
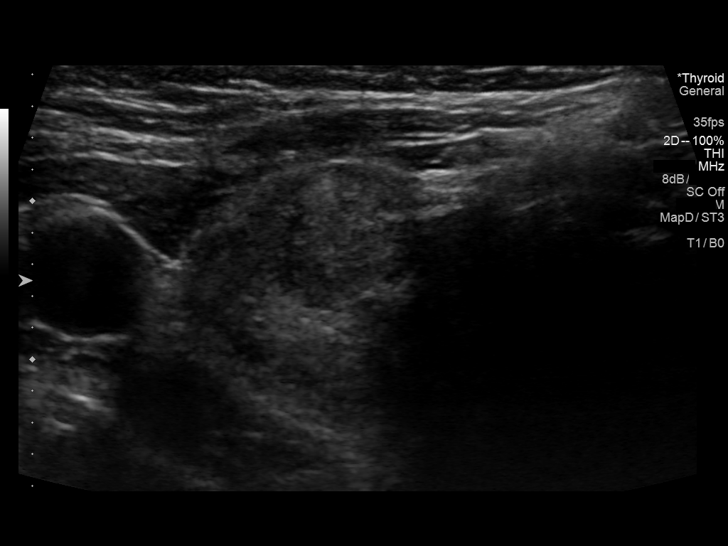
[im 32/48]
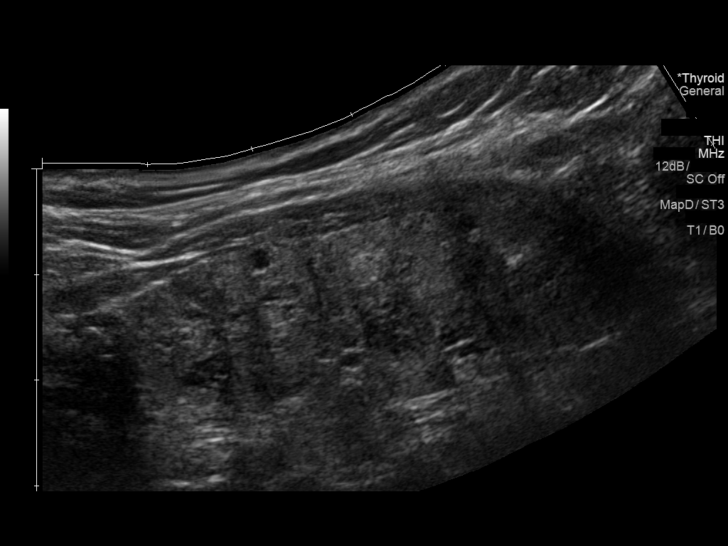
[im 36/48]
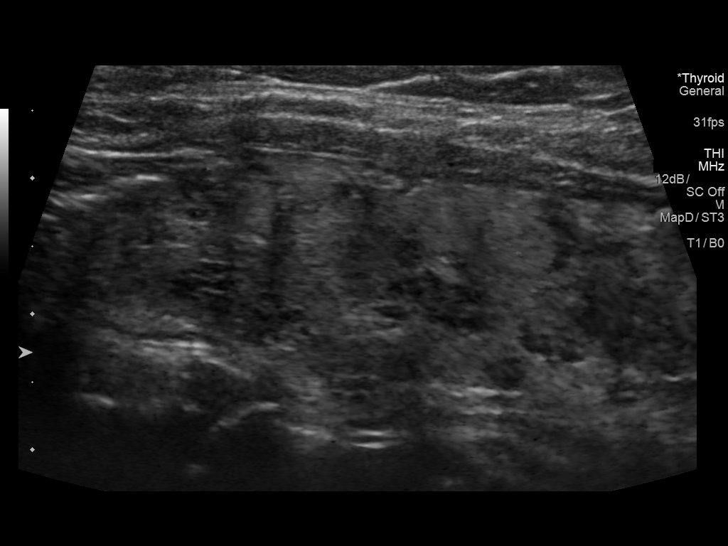
[im 40/48]
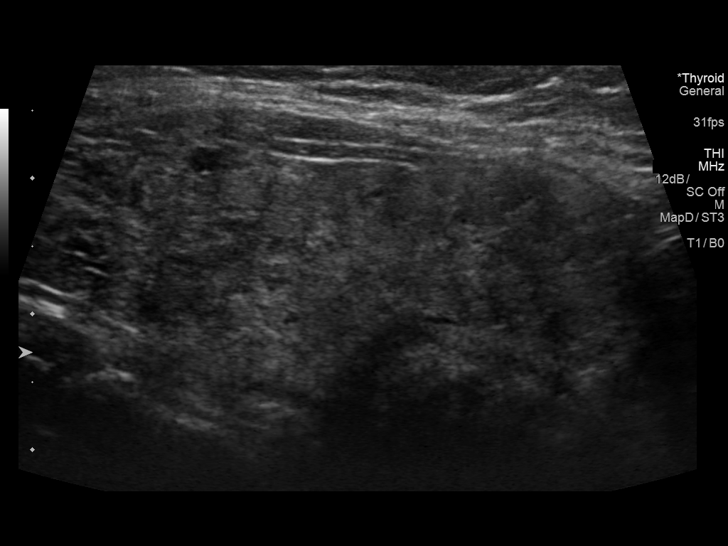
[im 44/48]
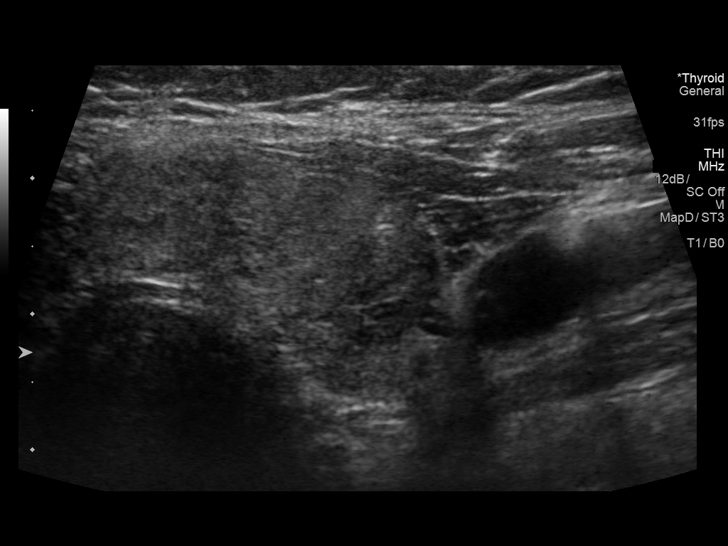
[im 48/48]
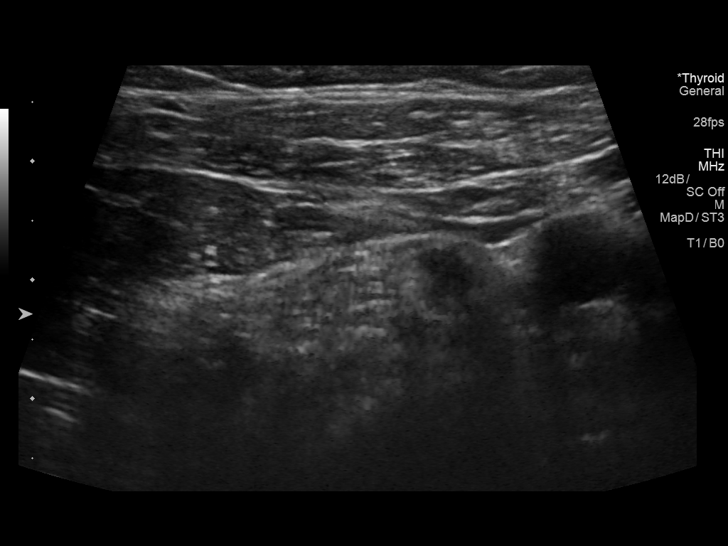

[13 of 25 positions shown; findings below may reference images not displayed]

FINDINGS: Parenchymal Echotexture: Moderately heterogenous

Isthmus: 0.7 cm

Right lobe: 5.0 cm x 2.3 cm x 1.8 cm

Left lobe: 5.0 cm x 1.5 cm x 1.6 cm

_________________________________________________________

Estimated total number of nodules >/= 1 cm: 2

Number of spongiform nodules >/=  2 cm not described below (TR1): 0

Number of mixed cystic and solid nodules >/= 1.5 cm not described
below (TR2): 0

_________________________________________________________

Nodule # 1:

Location: Isthmus; Mid

Maximum size: 1.8 cm; Other 2 dimensions: 1.3 cm x 1.2 cm

Composition: solid/almost completely solid (2)

Echogenicity: isoechoic (1)

Shape: not taller-than-wide (0)

Margins: ill-defined (0)

Echogenic foci: none (0)

ACR TI-RADS total points: 3.

ACR TI-RADS risk category: TR3 (3 points).

ACR TI-RADS recommendations:

Nodule meets criteria for surveillance

_________________________________________________________

Nodule # 2:

Location: Right; Superior

Maximum size: 1.9 cm; Other 2 dimensions: 1.6 cm x 1.6 cm

Composition: solid/almost completely solid (2)

Echogenicity: isoechoic (1)

Shape: not taller-than-wide (0)

Margins: ill-defined (0)

Echogenic foci: none (0)

ACR TI-RADS total points: 3.

ACR TI-RADS risk category: TR3 (3 points).

ACR TI-RADS recommendations:

Nodule meets criteria for surveillance

_________________________________________________________

Inferior right nodule (labeled 3 on the current) does not meet
criteria for surveillance or biopsy.

No adenopathy
IMPRESSION: Isthmic thyroid nodule (labeled 1) and the right superior thyroid
nodule (labeled 2) meet criteria for surveillance, as designated by
the newly established ACR TI-RADS criteria. Surveillance ultrasound
study recommended to be performed annually up to 5 years.

Recommendations follow those established by the new ACR TI-RADS
criteria ([HOSPITAL] 9323;[DATE]).

## 2019-07-27 NOTE — Telephone Encounter (Signed)
   Patient calling to report lower back pain and knee pain started several days ago. Patient wants advice on taking OTC medication or having RX called to pharmacy

## 2019-07-27 NOTE — Telephone Encounter (Signed)
Ok to start with tylenol prn  I would avoid regular nsaid use but could take advil 200 mg off and on if needed

## 2019-07-28 NOTE — Telephone Encounter (Signed)
Notified pt w/MD response.../lmb 

## 2019-08-06 ENCOUNTER — Encounter: Payer: Self-pay | Admitting: Internal Medicine

## 2019-08-06 ENCOUNTER — Other Ambulatory Visit: Payer: Self-pay

## 2019-08-06 ENCOUNTER — Ambulatory Visit (INDEPENDENT_AMBULATORY_CARE_PROVIDER_SITE_OTHER): Payer: Medicare HMO | Admitting: Internal Medicine

## 2019-08-06 VITALS — BP 124/82 | HR 71 | Temp 98.3°F | Ht 62.0 in | Wt 191.0 lb

## 2019-08-06 DIAGNOSIS — N3281 Overactive bladder: Secondary | ICD-10-CM

## 2019-08-06 DIAGNOSIS — M25561 Pain in right knee: Secondary | ICD-10-CM | POA: Diagnosis not present

## 2019-08-06 DIAGNOSIS — E611 Iron deficiency: Secondary | ICD-10-CM

## 2019-08-06 DIAGNOSIS — R7303 Prediabetes: Secondary | ICD-10-CM

## 2019-08-06 DIAGNOSIS — E538 Deficiency of other specified B group vitamins: Secondary | ICD-10-CM | POA: Diagnosis not present

## 2019-08-06 DIAGNOSIS — E559 Vitamin D deficiency, unspecified: Secondary | ICD-10-CM | POA: Diagnosis not present

## 2019-08-06 DIAGNOSIS — Z0001 Encounter for general adult medical examination with abnormal findings: Secondary | ICD-10-CM | POA: Diagnosis not present

## 2019-08-06 DIAGNOSIS — M79645 Pain in left finger(s): Secondary | ICD-10-CM | POA: Insufficient documentation

## 2019-08-06 DIAGNOSIS — M5431 Sciatica, right side: Secondary | ICD-10-CM | POA: Diagnosis not present

## 2019-08-06 LAB — HEPATIC FUNCTION PANEL
ALT: 17 U/L (ref 0–35)
AST: 15 U/L (ref 0–37)
Albumin: 4.3 g/dL (ref 3.5–5.2)
Alkaline Phosphatase: 77 U/L (ref 39–117)
Bilirubin, Direct: 0.1 mg/dL (ref 0.0–0.3)
Total Bilirubin: 0.6 mg/dL (ref 0.2–1.2)
Total Protein: 7.4 g/dL (ref 6.0–8.3)

## 2019-08-06 LAB — URINALYSIS, ROUTINE W REFLEX MICROSCOPIC
Bilirubin Urine: NEGATIVE
Hgb urine dipstick: NEGATIVE
Ketones, ur: NEGATIVE
Nitrite: NEGATIVE
RBC / HPF: NONE SEEN (ref 0–?)
Specific Gravity, Urine: 1.02 (ref 1.000–1.030)
Total Protein, Urine: NEGATIVE
Urine Glucose: NEGATIVE
Urobilinogen, UA: 0.2 (ref 0.0–1.0)
pH: 6.5 (ref 5.0–8.0)

## 2019-08-06 LAB — CBC WITH DIFFERENTIAL/PLATELET
Basophils Absolute: 0 10*3/uL (ref 0.0–0.1)
Basophils Relative: 0.7 % (ref 0.0–3.0)
Eosinophils Absolute: 0 10*3/uL (ref 0.0–0.7)
Eosinophils Relative: 0.8 % (ref 0.0–5.0)
HCT: 41.8 % (ref 36.0–46.0)
Hemoglobin: 13.6 g/dL (ref 12.0–15.0)
Lymphocytes Relative: 45.3 % (ref 12.0–46.0)
Lymphs Abs: 2.8 10*3/uL (ref 0.7–4.0)
MCHC: 32.5 g/dL (ref 30.0–36.0)
MCV: 85.4 fl (ref 78.0–100.0)
Monocytes Absolute: 0.5 10*3/uL (ref 0.1–1.0)
Monocytes Relative: 7.6 % (ref 3.0–12.0)
Neutro Abs: 2.8 10*3/uL (ref 1.4–7.7)
Neutrophils Relative %: 45.6 % (ref 43.0–77.0)
Platelets: 328 10*3/uL (ref 150.0–400.0)
RBC: 4.89 Mil/uL (ref 3.87–5.11)
RDW: 13.8 % (ref 11.5–15.5)
WBC: 6.1 10*3/uL (ref 4.0–10.5)

## 2019-08-06 LAB — IBC PANEL
Iron: 91 ug/dL (ref 42–145)
Saturation Ratios: 29.3 % (ref 20.0–50.0)
Transferrin: 222 mg/dL (ref 212.0–360.0)

## 2019-08-06 LAB — BASIC METABOLIC PANEL
BUN: 14 mg/dL (ref 6–23)
CO2: 30 mEq/L (ref 19–32)
Calcium: 9.8 mg/dL (ref 8.4–10.5)
Chloride: 103 mEq/L (ref 96–112)
Creatinine, Ser: 0.75 mg/dL (ref 0.40–1.20)
GFR: 75.65 mL/min (ref >60.00)
Glucose, Bld: 100 mg/dL — ABNORMAL HIGH (ref 70–99)
Potassium: 4.2 mEq/L (ref 3.5–5.1)
Sodium: 139 mEq/L (ref 135–145)

## 2019-08-06 LAB — HEMOGLOBIN A1C: Hgb A1c MFr Bld: 6.1 % (ref 4.6–6.5)

## 2019-08-06 LAB — LIPID PANEL
Cholesterol: 275 mg/dL — ABNORMAL HIGH (ref 0–200)
HDL: 48.6 mg/dL (ref >39.00)
LDL Cholesterol: 188 mg/dL — ABNORMAL HIGH (ref 0–99)
NonHDL: 225.92
Total CHOL/HDL Ratio: 6
Triglycerides: 189 mg/dL — ABNORMAL HIGH (ref 0.0–149.0)
VLDL: 37.8 mg/dL (ref 0.0–40.0)

## 2019-08-06 LAB — VITAMIN B12: Vitamin B-12: >1500 pg/mL — ABNORMAL HIGH (ref 211–911)

## 2019-08-06 LAB — TSH: TSH: 2.44 u[IU]/mL (ref 0.35–4.50)

## 2019-08-06 LAB — VITAMIN D 25 HYDROXY (VIT D DEFICIENCY, FRACTURES): VITD: 43.76 ng/mL (ref 30.00–100.00)

## 2019-08-06 MED ORDER — TRAMADOL HCL 50 MG PO TABS: 50.0000 mg | ORAL_TABLET | Freq: Four times a day (QID) | ORAL | 0 refills | Status: DC | PRN

## 2019-08-06 NOTE — Assessment & Plan Note (Signed)
For replacement, f/u lab

## 2019-08-06 NOTE — Progress Notes (Signed)
Subjective:    Patient ID: Debra Atkinson, female    DOB: 01-29-1946, 74 y.o.   MRN: [id number]  HPI  Here for wellness and f/u;  Overall doing ok;  Pt denies Chest pain, worsening SOB, DOE, wheezing, orthopnea, PND, worsening LE edema, palpitations, dizziness or syncope.  Pt denies neurological change such as new headache, facial or extremity weakness.  Pt denies polydipsia, polyuria, or low sugar symptoms. Pt states overall good compliance with treatment and medications, good tolerability, and has been trying to follow appropriate diet.  Pt denies worsening depressive symptoms, suicidal ideation or panic. No fever, night sweats, wt loss, loss of appetite, or other constitutional symptoms.  Pt states good ability with ADL's, has low fall risk, home safety reviewed and adequate, no other significant changes in hearing or vision, and only occasionally active with exercise. 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  ALso has OAB but current tx lead to dry mouth, asks for change if possible but declines myrbetrix due to risk of worsening HTN Past Medical History:  Diagnosis Date  . Cerebrovascular disease 02/05/2019  . Concussion '06, '11  . Diabetes mellitus    diet management  . Family history of colon cancer 02/28/2016  . Fibromyalgia 10/08/2013  . Hyperlipidemia    diet managed  . Varicella    Past Surgical History:  Procedure Laterality Date  . APPENDECTOMY  1979  . [place]  . CHOLECYSTECTOMY  1979   laparotomy    reports that she has never smoked. She has never used smokeless tobacco. She reports that she does not drink alcohol or use drugs. family history includes COPD in her mother; Cancer  (age of onset: 29) in her mother; Diabetes in her maternal grandfather and mother; Heart disease in her father and mother; Rheumatic fever in her father. Allergies  Allergen Reactions  . Methylprednisolone Itching   Current Outpatient Medications on File Prior to Visit  Medication Sig Dispense Refill  . cetirizine (ZYRTEC) 10 MG tablet Take 1 tablet by mouth once daily 30 tablet 0  . Cholecalciferol ([employer] D3) 2000 units CAPS Take by mouth.    . Cyanocobalamin ([employer]S VITAMIN B-12 PO) Take 2,500 tablets by mouth daily.     [name] ibuprofen (ADVIL,MOTRIN) 200 MG tablet Take 200 mg by mouth every 6 (six) hours as needed.    . meclizine (ANTIVERT) 25 MG tablet Take 1 tablet (25 mg total) by mouth 3 (three) times daily as needed for dizziness. 60 tablet 3  . triamcinolone (NASACORT AQ) 55 MCG/ACT AERO nasal inhaler Place 2 sprays into the nose daily. 3 Inhaler 3  . diazepam (VALIUM) 5 MG tablet Take 1 tablet (5 mg total) by mouth every 12 (twelve) hours as needed for anxiety. (Patient not taking: Reported on 08/06/2019) 10 tablet 2  . methylPREDNISolone (MEDROL) 4 MG TBPK tablet 4 tab by mouth x 3day,2tab x 3day,1tab x 3day (Patient not taking: Reported on 08/06/2019) 21 tablet 0  . predniSONE (DELTASONE) 10 MG tablet 3 tabs by mouth per day for 3 days,2tabs per day for 3 days,1tab per day for 3 days (Patient not taking: Reported on 08/06/2019) 18 tablet 0  . rosuvastatin (CRESTOR) 20 MG tablet Take 1 tablet (  20 mg total) by mouth daily. (Patient not taking: Reported on 08/06/2019) 90 tablet 3  . tiZANidine (ZANAFLEX) 4 MG tablet Take 1 tablet (4 mg total) by mouth every 6 (six) hours as needed for muscle spasms. (Patient not taking: Reported on 08/06/2019) 30 tablet 1   No current facility-administered medications on file prior to visit.   Review of Systems All otherwise neg per pt     Objective:   Physical Exam BP 124/82   Pulse 71   Temp 98.3 F (36.8 C)   Ht 5\' 2"  (1.575 m)   Wt 191 lb (86.6 kg)   SpO2  99%   BMI 34.93 kg/m  VS noted,  Constitutional: Pt appears in NAD HENT: Head: NCAT.  Right Ear: External ear normal.  Left Ear: External ear normal.  Eyes: . Pupils are equal, round, and reactive to light. Conjunctivae and EOM are normal Nose: without d/c or deformity Neck: Neck supple. Gross normal ROM Cardiovascular: Normal rate and regular rhythm.   Pulmonary/Chest: Effort normal and breath sounds without rales or wheezing.  Abd:  Soft, NT, ND, + BS, no organomegaly Neurological: Pt is alert. At baseline orientation, motor grossly intact Skin: Skin is warm. No rashes, other new lesions, no LE edema Psychiatric: Pt behavior is normal without agitation  Lab Results  Component Value Date   WBC 6.1 08/06/2019   HGB 13.6 08/06/2019   HCT 41.8 08/06/2019   PLT 328.0 08/06/2019   GLUCOSE 100 (H) 08/06/2019   CHOL 275 (H) 08/06/2019   TRIG 189.0 (H) 08/06/2019   HDL 48.60 08/06/2019   LDLDIRECT 189.0 05/08/2018   LDLCALC 188 (H) 08/06/2019   ALT 17 08/06/2019   AST 15 08/06/2019   NA 139 08/06/2019   K 4.2 08/06/2019   CL 103 08/06/2019   CREATININE 0.75 08/06/2019   BUN 14 08/06/2019   CO2 30 08/06/2019   TSH 2.44 08/06/2019   INR 0.92 05/24/2011   HGBA1C 6.1 08/06/2019   MICROALBUR 0.9 10/26/2015      Assessment & Plan:

## 2019-08-06 NOTE — Assessment & Plan Note (Signed)
stable overall by history and exam, recent data reviewed with pt, and pt to continue medical treatment as before,  to f/u any worsening symptoms or concerns  

## 2019-08-06 NOTE — Assessment & Plan Note (Signed)
For tramadol prn

## 2019-08-06 NOTE — Patient Instructions (Signed)
Please take all new medication as prescribed - the tramadol for pain as needed  We are only allowed to give #30 pills to start, so call if you need further after this  Please continue all other medications as before, and refills have been done if requested.  Please have the pharmacy call with any other refills you may need.  Please continue your efforts at being more active, low cholesterol diet, and weight control.  You are otherwise up to date with prevention measures today.  Please keep your appointments with your specialists as you may have planned  Please go to the LAB at the blood drawing area for the tests to be done  You will be contacted by phone if any changes need to be made immediately.  Otherwise, you will receive a letter about your results with an explanation, but please check with MyChart first.  Please remember to sign up for MyChart if you have not done so, as this will be important to you in the future with finding out test results, communicating by private email, and scheduling acute appointments online when needed.  Please make an Appointment to return in 6 months, or sooner if needed

## 2019-08-06 NOTE — Assessment & Plan Note (Addendum)
For tramadol prn, declines hand surgury  I spent 35 minutes preparing to see the patient by review of recent labs, imaging and procedures, obtaining and reviewing separately obtained history, communicating with the patient and family or caregiver, ordering medications, tests or procedures, and documenting clinical information in the EHR including the differential Dx, treatment, and any further evaluation and other management of roght sciatica, right knee pain, left thumb pain, OAB, vit d deficiency, b12 deficiency, hyperglycemia

## 2019-08-06 NOTE — Assessment & Plan Note (Signed)
C/w djd - for tramadol prn

## 2019-08-06 NOTE — Assessment & Plan Note (Signed)
For oral replacement 

## 2019-08-06 NOTE — Assessment & Plan Note (Signed)
[place] change in tx for now, to f/u urology

## 2019-08-10 ENCOUNTER — Other Ambulatory Visit: Payer: Self-pay | Admitting: Internal Medicine

## 2019-08-10 MED ORDER — ROSUVASTATIN CALCIUM 40 MG PO TABS: 40.0000 mg | ORAL_TABLET | Freq: Every day | ORAL | 3 refills | Status: DC

## 2019-09-04 ENCOUNTER — Telehealth: Payer: Self-pay | Admitting: Internal Medicine

## 2019-09-04 NOTE — Telephone Encounter (Signed)
    Patient calling to discuss the covid vaccine. Would like to know if Dr [name] Reichmann will contact her to  provide input. Patient considering scheduling  covid vaccine for this Sunday.  Please call

## 2019-09-07 ENCOUNTER — Ambulatory Visit (INDEPENDENT_AMBULATORY_CARE_PROVIDER_SITE_OTHER): Payer: Medicare HMO | Admitting: Internal Medicine

## 2019-09-07 ENCOUNTER — Other Ambulatory Visit: Payer: Self-pay

## 2019-09-07 ENCOUNTER — Encounter: Payer: Self-pay | Admitting: Internal Medicine

## 2019-09-07 DIAGNOSIS — R109 Unspecified abdominal pain: Secondary | ICD-10-CM

## 2019-09-07 DIAGNOSIS — R1031 Right lower quadrant pain: Secondary | ICD-10-CM | POA: Insufficient documentation

## 2019-09-07 DIAGNOSIS — R7303 Prediabetes: Secondary | ICD-10-CM | POA: Diagnosis not present

## 2019-09-07 NOTE — Assessment & Plan Note (Signed)
stable overall by history and exam, recent data reviewed with pt, and pt to continue medical treatment as before,  to f/u any worsening symptoms or concerns  

## 2019-09-07 NOTE — Assessment & Plan Note (Addendum)
?   GI vs GU, cant r/o UTI or renal stone, for ct abd, also labs as ordered  I spent 40 minutes in preparing to see the patient by review of recent labs, imaging and procedures, obtaining and reviewing separately obtained history, communicating with the patient and family or caregiver, ordering medications, tests or procedures, and documenting clinical information in the EHR including the differential Dx, treatment, and any further evaluation and other management of RLQ abd pain, hyperglycemia

## 2019-09-07 NOTE — Telephone Encounter (Signed)
Pt contacted and informed that she would need an appointment. Pt stated that she had an appointment with him today. I concur that the appointment would be the perfect time to talk to Dr. [name] Reichmann about the COVID vaccine.

## 2019-09-07 NOTE — Patient Instructions (Signed)
Please continue all other medications as before, and refills have been done if requested.  Please have the pharmacy call with any other refills you may need.  Please continue your efforts at being more active, low cholesterol diet, and weight control.  You are otherwise up to date with prevention measures today.  Please keep your appointments with your specialists as you may have planned  You will be contacted regarding the referral for: CT scan  Please go to the LAB at the blood drawing area for the tests to be done  You will be contacted by phone if any changes need to be made immediately.  Otherwise, you will receive a letter about your results with an explanation, but please check with MyChart first.  Please remember to sign up for MyChart if you have not done so, as this will be important to you in the future with finding out test results, communicating by private email, and scheduling acute appointments online when needed.

## 2019-09-07 NOTE — Progress Notes (Signed)
Subjective:    Patient ID: Debra Atkinson, female    DOB: [birth year], 74 y.o.   MRN: [id number]  HPI  Here to f/u with hx of IBS  But now with pain she feels is different, 2-3 ks osnet, primarily RLQ now but has had some tingling discomfort right side and lower back as well, mild to mod, with some nausea and post urination discomfort and urinary frequency; tried oxybutinin per GYN recently but had dry mouth, had to stop.  No vomiting, but has normal BM 2-3 times per day without blood.  Denies worsening reflux, dysphagia, or blood. Pt denies chest pain, increased sob or doe, wheezing, orthopnea, PND, increased LE swelling, palpitations, dizziness or syncope.  Pt denies new neurological symptoms such as new headache, or facial or extremity weakness or numbness   Pt denies polydipsia, polyuria  Nothing else seems to make better or worse. Past Medical History:  Diagnosis Date  . Cerebrovascular disease 02/05/2019  . Concussion '06, '11  . Diabetes mellitus    diet management  . Family history of colon cancer 02/28/2016  . Fibromyalgia 10/08/2013  . Hyperlipidemia    diet managed  . Varicella    Past Surgical History:  Procedure Laterality Date  . APPENDECTOMY  1979  . [place]  . CHOLECYSTECTOMY  1979   laparotomy    reports that she has never smoked. She has never used smokeless tobacco. She reports that she does not drink alcohol or use drugs. family history includes COPD in her mother; Cancer (age of onset: 80) in her mother; Diabetes in her maternal grandfather and mother; Heart disease in her father and mother; Rheumatic fever in her father. Allergies  Allergen Reactions  . Methylprednisolone Itching   Current Outpatient Medications on File Prior to Visit  Medication Sig Dispense Refill  . cetirizine (ZYRTEC) 10 MG tablet Take 1 tablet by mouth once daily 30 tablet 0  . Cholecalciferol ([employer]S D3) 2000 units CAPS Take by mouth.    . Cyanocobalamin ([employer]S VITAMIN B-12 PO)  Take 2,500 tablets by mouth daily.     [name] ibuprofen (ADVIL,MOTRIN) 200 MG tablet Take 200 mg by mouth every 6 (six) hours as needed.    . diazepam (VALIUM) 5 MG tablet Take 1 tablet (5 mg total) by mouth every 12 (twelve) hours as needed for anxiety. (Patient not taking: Reported on 08/06/2019) 10 tablet 2  . meclizine (ANTIVERT) 25 MG tablet Take 1 tablet (25 mg total) by mouth 3 (three) times daily as needed for dizziness. (Patient not taking: Reported on 09/07/2019) 60 tablet 3  . methylPREDNISolone (MEDROL) 4 MG TBPK tablet 4 tab by mouth x 3day,2tab x 3day,1tab x 3day (Patient not taking: Reported on 08/06/2019) 21 tablet 0  . predniSONE (DELTASONE) 10 MG tablet 3 tabs by mouth per day for 3 days,2tabs per day for 3 days,1tab per day for 3 days (Patient not taking: Reported on 08/06/2019) 18 tablet 0  . rosuvastatin (CRESTOR) 40 MG tablet Take 1 tablet (40 mg total) by mouth daily. (Patient not taking: Reported on 09/07/2019) 90 tablet 3  . tiZANidine (ZANAFLEX) 4 MG tablet Take 1 tablet (4 mg total) by mouth every 6 (six) hours as needed for muscle spasms. (Patient not taking: Reported on 08/06/2019) 30 tablet 1  . traMADol (ULTRAM) 50 MG tablet Take 1 tablet (50 mg total) by mouth every 6 (six) hours as needed. (Patient not taking: Reported on 09/07/2019) 30 tablet 0  . triamcinolone (NASACORT  AQ) 55 MCG/ACT AERO nasal inhaler Place 2 sprays into the nose daily. (Patient not taking: Reported on 09/07/2019) 3 Inhaler 3   No current facility-administered medications on file prior to visit.   Review of Systems All otherwise neg per pt     Objective:   Physical Exam BP 136/78   Pulse 82   Temp 98.2 F (36.8 C)   Ht 5\' 2"  (1.575 m)   Wt 195 lb 12.8 oz (88.8 kg)   SpO2 98%   BMI 35.81 kg/m  VS noted,  Constitutional: Pt appears in NAD HENT: Head: NCAT.  Right Ear: External ear normal.  Left Ear: External ear normal.  Eyes: . Pupils are equal, round, and reactive to light. Conjunctivae and EOM are  normal Nose: without d/c or deformity Neck: Neck supple. Gross normal ROM Cardiovascular: Normal rate and regular rhythm.   Pulmonary/Chest: Effort normal and breath sounds without rales or wheezing.  Abd:  Soft, NT, ND, + BS, no organomegaly Neurological: Pt is alert. At baseline orientation, motor grossly intact Skin: Skin is warm. No rashes, other new lesions, no LE edema Psychiatric: Pt behavior is normal without agitation  All otherwise neg per pt  Lab Results  Component Value Date   WBC 6.1 08/06/2019   HGB 13.6 08/06/2019   HCT 41.8 08/06/2019   PLT 328.0 08/06/2019   GLUCOSE 100 (H) 08/06/2019   CHOL 275 (H) 08/06/2019   TRIG 189.0 (H) 08/06/2019   HDL 48.60 08/06/2019   LDLDIRECT 189.0 05/08/2018   LDLCALC 188 (H) 08/06/2019   ALT 17 08/06/2019   AST 15 08/06/2019   NA 139 08/06/2019   K 4.2 08/06/2019   CL 103 08/06/2019   CREATININE 0.75 08/06/2019   BUN 14 08/06/2019   CO2 30 08/06/2019   TSH 2.44 08/06/2019   INR 0.92 05/24/2011   HGBA1C 6.1 08/06/2019   MICROALBUR 0.9 10/26/2015        Assessment & Plan:

## 2019-09-08 LAB — CBC WITH DIFFERENTIAL/PLATELET
Basophils Absolute: 0.1 10*3/uL (ref 0.0–0.1)
Basophils Relative: 0.9 % (ref 0.0–3.0)
Eosinophils Absolute: 0.1 10*3/uL (ref 0.0–0.7)
Eosinophils Relative: 1.1 % (ref 0.0–5.0)
HCT: 38.6 % (ref 36.0–46.0)
Hemoglobin: 12.8 g/dL (ref 12.0–15.0)
Lymphocytes Relative: 34.5 % (ref 12.0–46.0)
Lymphs Abs: 2.7 10*3/uL (ref 0.7–4.0)
MCHC: 33.1 g/dL (ref 30.0–36.0)
MCV: 84.6 fl (ref 78.0–100.0)
Monocytes Absolute: 0.8 10*3/uL (ref 0.1–1.0)
Monocytes Relative: 10 % (ref 3.0–12.0)
Neutro Abs: 4.2 10*3/uL (ref 1.4–7.7)
Neutrophils Relative %: 53.5 % (ref 43.0–77.0)
Platelets: 326 10*3/uL (ref 150.0–400.0)
RBC: 4.56 Mil/uL (ref 3.87–5.11)
RDW: 13.7 % (ref 11.5–15.5)
WBC: 7.9 10*3/uL (ref 4.0–10.5)

## 2019-09-08 LAB — URINE CULTURE

## 2019-09-08 LAB — URINALYSIS, ROUTINE W REFLEX MICROSCOPIC
Bilirubin Urine: NEGATIVE
Hgb urine dipstick: NEGATIVE
Leukocytes,Ua: NEGATIVE
Nitrite: NEGATIVE
RBC / HPF: NONE SEEN (ref 0–?)
Specific Gravity, Urine: 1.025 (ref 1.000–1.030)
Total Protein, Urine: NEGATIVE
Urine Glucose: NEGATIVE
Urobilinogen, UA: 0.2 (ref 0.0–1.0)
WBC, UA: NONE SEEN (ref 0–?)
pH: 6 (ref 5.0–8.0)

## 2019-09-08 LAB — HEPATIC FUNCTION PANEL
ALT: 20 U/L (ref 0–35)
AST: 16 U/L (ref 0–37)
Albumin: 3.9 g/dL (ref 3.5–5.2)
Alkaline Phosphatase: 79 U/L (ref 39–117)
Bilirubin, Direct: 0.1 mg/dL (ref 0.0–0.3)
Total Bilirubin: 0.4 mg/dL (ref 0.2–1.2)
Total Protein: 6.9 g/dL (ref 6.0–8.3)

## 2019-09-08 LAB — BASIC METABOLIC PANEL
BUN: 16 mg/dL (ref 6–23)
CO2: 32 mEq/L (ref 19–32)
Calcium: 9.2 mg/dL (ref 8.4–10.5)
Chloride: 105 mEq/L (ref 96–112)
Creatinine, Ser: 0.95 mg/dL (ref 0.40–1.20)
GFR: 57.58 mL/min — ABNORMAL LOW (ref >60.00)
Glucose, Bld: 104 mg/dL — ABNORMAL HIGH (ref 70–99)
Potassium: 4 mEq/L (ref 3.5–5.1)
Sodium: 141 mEq/L (ref 135–145)

## 2019-09-08 LAB — LIPASE: Lipase: 15 U/L (ref 11.0–59.0)

## 2019-09-09 ENCOUNTER — Ambulatory Visit (INDEPENDENT_AMBULATORY_CARE_PROVIDER_SITE_OTHER)
Admission: RE | Admit: 2019-09-09 | Discharge: 2019-09-09 | Disposition: A | Discharge status: Home / Self Care | Payer: Medicare HMO | Source: Ambulatory Visit | Attending: Internal Medicine | Admitting: Internal Medicine

## 2019-09-09 ENCOUNTER — Other Ambulatory Visit: Payer: Self-pay

## 2019-09-09 DIAGNOSIS — R1031 Right lower quadrant pain: Secondary | ICD-10-CM | POA: Diagnosis not present

## 2019-09-09 DIAGNOSIS — R109 Unspecified abdominal pain: Secondary | ICD-10-CM

## 2019-09-09 MED ORDER — IOHEXOL 300 MG/ML  SOLN
100.0000 mL | Freq: Once | INTRAMUSCULAR | Status: AC | PRN
  Administered 2019-09-09: 100 mL via INTRAVENOUS

## 2019-09-11 ENCOUNTER — Telehealth: Payer: Self-pay

## 2019-09-11 NOTE — Telephone Encounter (Signed)
New message    The patient calling has two questions.   1. Calling for test results.    2. C/o left knee pain happen late evening took mustard wanted the MD to know.

## 2019-09-15 NOTE — Telephone Encounter (Signed)
Notified pt w/Md response.Marland KitchenAndee Atkinson

## 2019-09-15 NOTE — Telephone Encounter (Signed)
3/8 urine culture was negative, thanks

## 2019-09-15 NOTE — Telephone Encounter (Signed)
F/u  The patient calling for test results.

## 2019-09-15 NOTE — Telephone Encounter (Signed)
Called Debra Atkinson gave her MD response concerning the CT scan. Debra Atkinson also stated she had labs. Gave her MD response on the labs. Wanting to know how urine/ cx came out since results was pending at that time. pls advise.Marland KitchenJohny Chess

## 2019-09-23 DIAGNOSIS — H5711 Ocular pain, right eye: Secondary | ICD-10-CM | POA: Diagnosis not present

## 2019-09-23 DIAGNOSIS — S00251A Superficial foreign body of right eyelid and periocular area, initial encounter: Secondary | ICD-10-CM | POA: Diagnosis not present

## 2019-09-25 ENCOUNTER — Ambulatory Visit (INDEPENDENT_AMBULATORY_CARE_PROVIDER_SITE_OTHER): Payer: Medicare HMO | Admitting: Internal Medicine

## 2019-09-25 ENCOUNTER — Other Ambulatory Visit: Payer: Self-pay

## 2019-09-25 ENCOUNTER — Telehealth: Payer: Self-pay | Admitting: Internal Medicine

## 2019-09-25 ENCOUNTER — Encounter: Payer: Self-pay | Admitting: Internal Medicine

## 2019-09-25 VITALS — BP 140/88 | HR 82 | Temp 98.2°F | Ht 62.0 in | Wt 192.4 lb

## 2019-09-25 DIAGNOSIS — F419 Anxiety disorder, unspecified: Secondary | ICD-10-CM | POA: Diagnosis not present

## 2019-09-25 DIAGNOSIS — R7303 Prediabetes: Secondary | ICD-10-CM

## 2019-09-25 DIAGNOSIS — J309 Allergic rhinitis, unspecified: Secondary | ICD-10-CM | POA: Diagnosis not present

## 2019-09-25 DIAGNOSIS — L03211 Cellulitis of face: Secondary | ICD-10-CM | POA: Insufficient documentation

## 2019-09-25 MED ORDER — SULFAMETHOXAZOLE-TRIMETHOPRIM 800-160 MG PO TABS: 1.0000 | ORAL_TABLET | Freq: Two times a day (BID) | ORAL | 0 refills | Status: DC

## 2019-09-25 NOTE — Telephone Encounter (Signed)
Called patient lvm explaining MRSA question

## 2019-09-25 NOTE — Progress Notes (Signed)
Subjective:    Patient ID: Debra Atkinson, female    DOB: 1945/07/28, 74 y.o.   MRN: [id number]  HPI  Here with acute onset red, tedner swelling to right nasal and right maxillary facial area after scratching and manipulating with the hand, without fever, chills or drainage x 1 wk,  Did also incidentally have a splinter removed from above the right eye per optho and tx with emycin ointment.  No hx of mRSA.  Did use peroxide - ? Made it worse.  Pt denies chest pain, increased sob or doe, wheezing, orthopnea, PND, increased LE swelling, palpitations, dizziness or syncope.   Pt denies polydipsia, polyuria Past Medical History:  Diagnosis Date  . Cerebrovascular disease 02/05/2019  . Concussion '06, '11  . Diabetes mellitus    diet management  . Family history of colon cancer 02/28/2016  . Fibromyalgia 10/08/2013  . Hyperlipidemia    diet managed  . Varicella    Past Surgical History:  Procedure Laterality Date  . APPENDECTOMY  1979  . [place]  . CHOLECYSTECTOMY  1979   laparotomy    reports that she has never smoked. She has never used smokeless tobacco. She reports that she does not drink alcohol or use drugs. family history includes COPD in her mother; Cancer (age of onset: 50) in her mother; Diabetes in her maternal grandfather and mother; Heart disease in her father and mother; Rheumatic fever in her father. Allergies  Allergen Reactions  . Methylprednisolone Itching   Current Outpatient Medications on File Prior to Visit  Medication Sig Dispense Refill  . cetirizine (ZYRTEC) 10 MG tablet Take 1 tablet by mouth once daily 30 tablet 0  . Cholecalciferol (CVS D3) 2000 units CAPS Take by mouth.    . Cyanocobalamin (CVS VITAMIN B-12 PO) Take 2,500 tablets by mouth daily.     . diazepam (VALIUM) 5 MG tablet Take 1 tablet (5 mg total) by mouth every 12 (twelve) hours as needed for anxiety. 10 tablet 2  . ibuprofen (ADVIL,MOTRIN) 200 MG tablet Take 200 mg by mouth every 6  (six) hours as needed.    . meclizine (ANTIVERT) 25 MG tablet Take 1 tablet (25 mg total) by mouth 3 (three) times daily as needed for dizziness. 60 tablet 3  . rosuvastatin (CRESTOR) 40 MG tablet Take 1 tablet (40 mg total) by mouth daily. 90 tablet 3  . tiZANidine (ZANAFLEX) 4 MG tablet Take 1 tablet (4 mg total) by mouth every 6 (six) hours as needed for muscle spasms. 30 tablet 1  . traMADol (ULTRAM) 50 MG tablet Take 1 tablet (50 mg total) by mouth every 6 (six) hours as needed. 30 tablet 0  . triamcinolone (NASACORT AQ) 55 MCG/ACT AERO nasal inhaler Place 2 sprays into the nose daily. 3 Inhaler 3  . methylPREDNISolone (MEDROL) 4 MG TBPK tablet 4 tab by mouth x 3day,2tab x 3day,1tab x 3day (Patient not taking: Reported on 09/25/2019) 21 tablet 0  . predniSONE (DELTASONE) 10 MG tablet 3 tabs by mouth per day for 3 days,2tabs per day for 3 days,1tab per day for 3 days (Patient not taking: Reported on 09/25/2019) 18 tablet 0   No current facility-administered medications on file prior to visit.   Review of Systems All otherwise neg per pt     Objective:   Physical Exam BP 140/88   Pulse 82   Temp 98.2 F (36.8 C)   Ht 5\' 2"  (1.575 m)   Wt 192 lb  6.4 oz (87.3 kg)   SpO2 99%   BMI 35.19 kg/m  VS noted, non toxic Constitutional: Pt appears in NAD HENT: Head: NCAT.  Right Ear: External ear normal.  Left Ear: External ear normal.  Eyes: . Pupils are equal, round, and reactive to light. Conjunctivae and EOM are normal Nose: without d/c or deformity Neck: Neck supple. Gross normal ROM Cardiovascular: Normal rate and regular rhythm.   Pulmonary/Chest: Effort normal and breath sounds without rales or wheezing.  Neurological: Pt is alert. At baseline orientation, motor grossly intact Skin, no LE edema but has right nasal and right maxillary area about 2 cm area total with red, tender swelling without overt wound or drainage Psychiatric: Pt behavior is normal without agitation  All  otherwise neg per pt Lab Results  Component Value Date   WBC 7.9 09/07/2019   HGB 12.8 09/07/2019   HCT 38.6 09/07/2019   PLT 326.0 09/07/2019   GLUCOSE 104 (H) 09/07/2019   CHOL 275 (H) 08/06/2019   TRIG 189.0 (H) 08/06/2019   HDL 48.60 08/06/2019   LDLDIRECT 189.0 05/08/2018   LDLCALC 188 (H) 08/06/2019   ALT 20 09/07/2019   AST 16 09/07/2019   NA 141 09/07/2019   K 4.0 09/07/2019   CL 105 09/07/2019   CREATININE 0.95 09/07/2019   BUN 16 09/07/2019   CO2 32 09/07/2019   TSH 2.44 08/06/2019   INR 0.92 05/24/2011   HGBA1C 6.1 08/06/2019   MICROALBUR 0.9 10/26/2015        Assessment & Plan:

## 2019-09-25 NOTE — Patient Instructions (Signed)
Please take all new medication as prescribed - the antibiotic (which you can quit aftr 7 days if the redness is gone)  Please continue all other medications as before, and refills have been done if requested.  Please have the pharmacy call with any other refills you may need.  Please continue your efforts at being more active, low cholesterol diet, and weight control.  Please keep your appointments with your specialists as you may have planned

## 2019-09-25 NOTE — Telephone Encounter (Signed)
Caller Name: Jeymi Phone: [phone]  Pt called stating she is being treated for MRSA and wants to know if she is contagious. Can she be near her husband? Can she go to church?  Please call back.

## 2019-09-25 NOTE — Telephone Encounter (Signed)
   Patient calling with follow up questions from today's visit about MRSA. She wants to know if she is contagious  Please call

## 2019-09-27 ENCOUNTER — Encounter: Payer: Self-pay | Admitting: Internal Medicine

## 2019-09-27 DIAGNOSIS — F419 Anxiety disorder, unspecified: Secondary | ICD-10-CM | POA: Insufficient documentation

## 2019-09-27 NOTE — Assessment & Plan Note (Signed)
stable overall by history and exam, recent data reviewed with pt, and pt to continue medical treatment as before,  to f/u any worsening symptoms or concerns, d/w pt - no relation to current infectious issue

## 2019-09-27 NOTE — Assessment & Plan Note (Signed)
Situaitonal, reassured,  to f/u any worsening symptoms or concerns

## 2019-09-27 NOTE — Assessment & Plan Note (Signed)
stable overall by history and exam, recent data reviewed with pt, and pt to continue medical treatment as before,  to f/u any worsening symptoms or concerns  

## 2019-09-27 NOTE — Assessment & Plan Note (Addendum)
Mild right nasal and facial, for antibx course,  to f/u any worsening symptoms or concerns; I mentioned coverage for MRSA and though never heard of this prior, is now extremely worried and anxious, tried to reassure with all questions answered though she appears difficult to comprehend with sudden high anxiety  I spent 32 minutes in preparing to see the patient by review of recent labs, imaging and procedures, obtaining and reviewing separately obtained history, communicating with the patient and family or caregiver, ordering medications, tests or procedures, and documenting clinical information in the EHR including the differential Dx, treatment, and any further evaluation and other management of cellulitis, predM, allergies, and anxiety

## 2019-09-28 ENCOUNTER — Telehealth: Payer: Self-pay

## 2019-09-28 NOTE — Telephone Encounter (Signed)
Ok to cont this med as this does not cause low back pain

## 2019-09-28 NOTE — Telephone Encounter (Signed)
New message   Pt c/o medication issue:  1. Name of Medication: sulfamethoxazole-trimethoprim (BACTRIM DS) 800-160 MG tablet  2. How are you currently taking this medication (dosage and times per day)? Twice a day   3. Are you having a reaction (difficulty breathing--STAT)? No   4. What is your medication issue? Patient voiced should she continue taken medication - lower back hurting

## 2019-09-29 NOTE — Telephone Encounter (Signed)
Notified pt w/MD respone. Pt states she did not take dose for yesterday, and she didi not have any (R) groin & back pain, but she will take MD advise and start back taking today.Debra KitchenJohny Chess

## 2019-10-02 ENCOUNTER — Encounter: Payer: Self-pay | Admitting: Internal Medicine

## 2019-10-05 ENCOUNTER — Telehealth: Payer: Self-pay

## 2019-10-05 NOTE — Telephone Encounter (Signed)
New message    The patient calling asking the nurse to call her back regarding placed on her nose prescribed by Dr. [name] Reichmann. My Chart messages were sent    sulfamethoxazole-trimethoprim (BACTRIM DS) 800-160 MG tablet

## 2019-10-07 NOTE — Telephone Encounter (Signed)
Pt contacted and informed that if the facial cellulitis doesn't clear up or if it looks worse or begins to swell, blister or is hot to the touch then call us and we will have her make an appointment.

## 2019-10-13 ENCOUNTER — Encounter: Payer: Self-pay | Admitting: Internal Medicine

## 2019-10-13 DIAGNOSIS — R21 Rash and other nonspecific skin eruption: Secondary | ICD-10-CM

## 2019-10-26 ENCOUNTER — Encounter: Payer: Self-pay | Admitting: Internal Medicine

## 2019-10-26 DIAGNOSIS — D1801 Hemangioma of skin and subcutaneous tissue: Secondary | ICD-10-CM | POA: Diagnosis not present

## 2019-10-26 DIAGNOSIS — L821 Other seborrheic keratosis: Secondary | ICD-10-CM | POA: Diagnosis not present

## 2019-10-26 DIAGNOSIS — L080 Pyoderma: Secondary | ICD-10-CM | POA: Diagnosis not present

## 2019-10-26 DIAGNOSIS — L738 Other specified follicular disorders: Secondary | ICD-10-CM | POA: Diagnosis not present

## 2019-11-02 ENCOUNTER — Encounter: Payer: Self-pay | Admitting: Internal Medicine

## 2019-11-07 ENCOUNTER — Other Ambulatory Visit: Payer: Self-pay | Admitting: Internal Medicine

## 2019-11-27 ENCOUNTER — Other Ambulatory Visit: Payer: Self-pay

## 2019-11-27 ENCOUNTER — Encounter: Payer: Self-pay | Admitting: Internal Medicine

## 2019-11-27 ENCOUNTER — Ambulatory Visit (INDEPENDENT_AMBULATORY_CARE_PROVIDER_SITE_OTHER): Payer: Medicare HMO

## 2019-11-27 ENCOUNTER — Ambulatory Visit (INDEPENDENT_AMBULATORY_CARE_PROVIDER_SITE_OTHER): Payer: Medicare HMO | Admitting: Internal Medicine

## 2019-11-27 VITALS — BP 140/80 | HR 82 | Temp 97.7°F | Ht 62.0 in | Wt 193.0 lb

## 2019-11-27 VITALS — BP 140/80 | HR 82 | Temp 97.7°F | Resp 16 | Ht 62.0 in | Wt 193.4 lb

## 2019-11-27 DIAGNOSIS — Z0000 Encounter for general adult medical examination without abnormal findings: Secondary | ICD-10-CM

## 2019-11-27 DIAGNOSIS — H6981 Other specified disorders of Eustachian tube, right ear: Secondary | ICD-10-CM

## 2019-11-27 DIAGNOSIS — R519 Headache, unspecified: Secondary | ICD-10-CM

## 2019-11-27 DIAGNOSIS — R7303 Prediabetes: Secondary | ICD-10-CM | POA: Diagnosis not present

## 2019-11-27 DIAGNOSIS — R42 Dizziness and giddiness: Secondary | ICD-10-CM

## 2019-11-27 DIAGNOSIS — R5383 Other fatigue: Secondary | ICD-10-CM

## 2019-11-27 DIAGNOSIS — H1013 Acute atopic conjunctivitis, bilateral: Secondary | ICD-10-CM | POA: Diagnosis not present

## 2019-11-27 DIAGNOSIS — F419 Anxiety disorder, unspecified: Secondary | ICD-10-CM

## 2019-11-27 DIAGNOSIS — J309 Allergic rhinitis, unspecified: Secondary | ICD-10-CM

## 2019-11-27 DIAGNOSIS — G8929 Other chronic pain: Secondary | ICD-10-CM

## 2019-11-27 DIAGNOSIS — H9192 Unspecified hearing loss, left ear: Secondary | ICD-10-CM | POA: Diagnosis not present

## 2019-11-27 MED ORDER — MECLIZINE HCL 25 MG PO TABS: 25.0000 mg | ORAL_TABLET | Freq: Three times a day (TID) | ORAL | 3 refills | Status: DC | PRN

## 2019-11-27 MED ORDER — PREDNISONE 10 MG PO TABS: ORAL_TABLET | ORAL | 0 refills | Status: DC

## 2019-11-27 MED ORDER — MUCINEX 600 MG PO TB12: 1200.0000 mg | ORAL_TABLET | Freq: Two times a day (BID) | ORAL | 1 refills | Status: DC | PRN

## 2019-11-27 MED ORDER — CETIRIZINE HCL 10 MG PO TABS: 10.0000 mg | ORAL_TABLET | Freq: Every day | ORAL | 3 refills | Status: DC

## 2019-11-27 MED ORDER — ROSUVASTATIN CALCIUM 40 MG PO TABS: 40.0000 mg | ORAL_TABLET | Freq: Every day | ORAL | 3 refills | Status: DC

## 2019-11-27 MED ORDER — TRIAMCINOLONE ACETONIDE 55 MCG/ACT NA AERO: 2.0000 | INHALATION_SPRAY | Freq: Every day | NASAL | 3 refills | Status: DC

## 2019-11-27 MED ORDER — METHYLPREDNISOLONE ACETATE 80 MG/ML IJ SUSP
80.0000 mg | Freq: Once | INTRAMUSCULAR | Status: AC
  Administered 2019-11-27: 80 mg via INTRAMUSCULAR

## 2019-11-27 NOTE — Progress Notes (Signed)
Subjective:    Patient ID: Debra Atkinson, female    DOB: [birth year], 74 y.o.   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  Also has intermittent vertigo for many years somewhat increased in freq recently, needs meclizine refill. Does c/o ongoing fatigue, but denies signficant daytime hypersomnolence. Also, Does have several wks ongoing nasal allergy symptoms with clearish congestion, itch and sneezing, without fever, pain, ST, cough, swelling or wheezing.  Also has left ear hearing reduced in the past wk and not sure if wax or not.  Also has some popping and crackling to the right ear worse in the past wk. Pt denies chest pain, increased sob or doe, wheezing, orthopnea, PND, increased LE swelling, palpitations, other dizziness or syncope.  Pt denies new neurological symptoms such as new head pain, facial or extremity weakness or numbness   Pt denies polydipsia, polyuria .  Denies worsening depressive symptoms, suicidal ideation, or panic; has ongoing anxiety Past Medical History:  Diagnosis Date  . Cerebrovascular disease 02/05/2019  . Concussion '06, '11  . Diabetes mellitus    diet management  . Family history of colon cancer 02/28/2016  . Fibromyalgia 10/08/2013  . Hyperlipidemia    diet managed  . Varicella    Past Surgical History:  Procedure Laterality Date  . APPENDECTOMY  1979  . [place]  . CHOLECYSTECTOMY  1979   laparotomy    reports that she has never smoked. She has never used smokeless tobacco. She reports that she does not drink alcohol or use drugs. family history includes COPD in her mother;  Cancer (age of onset: 65) in her mother; Diabetes in her maternal grandfather and mother; Heart disease in her father and mother; Rheumatic fever in her father. Allergies  Allergen Reactions  . Methylprednisolone Itching   Current Outpatient Medications on File Prior to Visit  Medication Sig Dispense Refill  . Cholecalciferol ([employer] D3) 50 MCG (2000 UT) CAPS Take by mouth.     . Cyanocobalamin ([employer] VITAMIN B-12 PO) Take 2,500 tablets by mouth daily.     . diazepam (VALIUM) 5 MG tablet Take 1 tablet (5 mg total) by mouth every 12 (twelve) hours as needed for anxiety. 10 tablet 2  . ibuprofen (ADVIL,MOTRIN) 200 MG tablet Take 200 mg by mouth every 6 (six) hours as needed.    . sulfamethoxazole-trimethoprim (BACTRIM DS) 800-160 MG tablet Take 1 tablet by mouth 2 (two) times daily. 20 tablet 0  . tiZANidine (ZANAFLEX) 4 MG tablet Take 1 tablet (4 mg total) by mouth every 6 (six) hours as needed for muscle spasms. 30 tablet 1  . traMADol (ULTRAM) 50 MG tablet Take 1 tablet (50 mg total) by mouth every 6 (six) hours as needed. 30 tablet 0   No current facility-administered medications on file prior to visit.   Review of Systems All otherwise neg per pt     Objective:   Physical Exam BP 140/80   Pulse 82   Temp 97.7 F (36.5 C) (Oral)   Ht 5\' 2"  (1.575 m)   Wt 193 lb (87.5 kg)   SpO2 97%  BMI 35.30 kg/m  VS noted,  Constitutional: Pt appears in NAD HENT: Head: NCAT.  Right Ear: External ear normal.  Left Ear: External ear normal.  left canal cleared of wax impaction with irrigation, and hearing improved.   Bilat tm's with mild erythema.  Max sinus areas non tender.  Pharynx with mild erythema, no exudate Eyes: . Pupils are equal, round, and reactive to light. Conjunctivae and EOM are normal Nose: without d/c or deformity Neck: Neck supple. Gross normal ROM Cardiovascular: Normal rate and regular rhythm.   Pulmonary/Chest: Effort normal and breath sounds without rales or wheezing.    Neurological: Pt is alert. At baseline orientation, motor grossly intact Skin: Skin is warm. No rashes, other new lesions, no LE edema Psychiatric: Pt behavior is normal without agitation  All otherwise neg per pt Lab Results  Component Value Date   WBC 7.9 09/07/2019   HGB 12.8 09/07/2019   HCT 38.6 09/07/2019   PLT 326.0 09/07/2019   GLUCOSE 104 (H) 09/07/2019   CHOL 275 (H) 08/06/2019   TRIG 189.0 (H) 08/06/2019   HDL 48.60 08/06/2019   LDLDIRECT 189.0 05/08/2018   LDLCALC 188 (H) 08/06/2019   ALT 20 09/07/2019   AST 16 09/07/2019   NA 141 09/07/2019   K 4.0 09/07/2019   CL 105 09/07/2019   CREATININE 0.95 09/07/2019   BUN 16 09/07/2019   CO2 32 09/07/2019   TSH 2.44 08/06/2019   INR 0.92 05/24/2011   HGBA1C 6.1 08/06/2019   MICROALBUR 0.9 10/26/2015      Assessment & Plan:

## 2019-11-27 NOTE — Patient Instructions (Addendum)
Your left ear was irrigated today  You had the steroid shot today  Please take all new medication as prescribed - the prednisone, and mucinex  Please continue all other medications as before, including the zyrtec and nasacort, and the meclizine  Please have the pharmacy call with any other refills you may need.  Please continue your efforts at being more active, low cholesterol diet, and weight control.  You are otherwise up to date with prevention measures today.  Please keep your appointments with your specialists as you may have planned

## 2019-11-27 NOTE — Patient Instructions (Signed)
Debra Atkinson , Thank you for taking time to come for your Medicare Wellness Visit. I appreciate your ongoing commitment to your health goals. Please review the following plan we discussed and let me know if I can assist you in the future.   Screening recommendations/referrals: Colonoscopy: 02/19/2017 Mammogram: 05/06/2019 Bone Density: not sure of date Recommended yearly ophthalmology/optometry visit for glaucoma screening and checkup Recommended yearly dental visit for hygiene and checkup  Vaccinations: Influenza vaccine: 05/07/2019 Pneumococcal vaccine: completed Tdap vaccine: 04/28/2012, due every 10 years Shingles vaccine: will check with local pharmacy   Covid-19: declined  Advanced directives: No; paperwork given to patient  Conditions/risks identified: Yes  Next appointment: Please schedule your 1 year Medicare Wellness Visit with your Health Coach.    Preventive Care 51 Years and Older, Female Preventive care refers to lifestyle choices and visits with your health care provider that can promote health and wellness. What does preventive care include?  A yearly physical exam. This is also called an annual well check.  Dental exams once or twice a year.  Routine eye exams. Ask your health care provider how often you should have your eyes checked.  Personal lifestyle choices, including:  Daily care of your teeth and gums.  Regular physical activity.  Eating a healthy diet.  Avoiding tobacco and drug use.  Limiting alcohol use.  Practicing safe sex.  Taking low-dose aspirin every day.  Taking vitamin and mineral supplements as recommended by your health care provider. What happens during an annual well check? The services and screenings done by your health care provider during your annual well check will depend on your age, overall health, lifestyle risk factors, and family history of disease. Counseling  Your health care provider may ask you questions about  your:  Alcohol use.  Tobacco use.  Drug use.  Emotional well-being.  Home and relationship well-being.  Sexual activity.  Eating habits.  History of falls.  Memory and ability to understand (cognition).  Work and work Statistician.  Reproductive health. Screening  You may have the following tests or measurements:  Height, weight, and BMI.  Blood pressure.  Lipid and cholesterol levels. These may be checked every 5 years, or more frequently if you are over 103 years old.  Skin check.  Lung cancer screening. You may have this screening every year starting at age 29 if you have a 30-pack-year history of smoking and currently smoke or have quit within the past 15 years.  Fecal occult blood test (FOBT) of the stool. You may have this test every year starting at age 47.  Flexible sigmoidoscopy or colonoscopy. You may have a sigmoidoscopy every 5 years or a colonoscopy every 10 years starting at age 79.  Hepatitis C blood test.  Hepatitis B blood test.  Sexually transmitted disease (STD) testing.  Diabetes screening. This is done by checking your blood sugar (glucose) after you have not eaten for a while (fasting). You may have this done every 1-3 years.  Bone density scan. This is done to screen for osteoporosis. You may have this done starting at age 30.  Mammogram. This may be done every 1-2 years. Talk to your health care provider about how often you should have regular mammograms. Talk with your health care provider about your test results, treatment options, and if necessary, the need for more tests. Vaccines  Your health care provider may recommend certain vaccines, such as:  Influenza vaccine. This is recommended every year.  Tetanus, diphtheria, and acellular pertussis (  Tdap, Td) vaccine. You may need a Td booster every 10 years.  Zoster vaccine. You may need this after age 28.  Pneumococcal 13-valent conjugate (PCV13) vaccine. One dose is recommended  after age 70.  Pneumococcal polysaccharide (PPSV23) vaccine. One dose is recommended after age 53. Talk to your health care provider about which screenings and vaccines you need and how often you need them. This information is not intended to replace advice given to you by your health care provider. Make sure you discuss any questions you have with your health care provider. Document Released: 07/15/2015 Document Revised: 03/07/2016 Document Reviewed: 04/19/2015 Elsevier Interactive Patient Education  2017 [place] Prevention in the Home Falls can cause injuries. They can happen to people of all ages. There are many things you can do to make your home safe and to help prevent falls. What can I do on the outside of my home?  Regularly fix the edges of walkways and driveways and fix any cracks.  Remove anything that might make you trip as you walk through a door, such as a raised step or threshold.  Trim any bushes or trees on the path to your home.  Use bright outdoor lighting.  Clear any walking paths of anything that might make someone trip, such as rocks or tools.  Regularly check to see if handrails are loose or broken. Make sure that both sides of any steps have handrails.  Any raised decks and porches should have guardrails on the edges.  Have any leaves, snow, or ice cleared regularly.  Use sand or salt on walking paths during winter.  Clean up any spills in your garage right away. This includes oil or grease spills. What can I do in the bathroom?  Use night lights.  Install grab bars by the toilet and in the tub and shower. Do not use towel bars as grab bars.  Use non-skid mats or decals in the tub or shower.  If you need to sit down in the shower, use a plastic, non-slip stool.  Keep the floor dry. Clean up any water that spills on the floor as soon as it happens.  Remove soap buildup in the tub or shower regularly.  Attach bath mats securely with  double-sided non-slip rug tape.  Do not have throw rugs and other things on the floor that can make you trip. What can I do in the bedroom?  Use night lights.  Make sure that you have a light by your bed that is easy to reach.  Do not use any sheets or blankets that are too big for your bed. They should not hang down onto the floor.  Have a firm chair that has side arms. You can use this for support while you get dressed.  Do not have throw rugs and other things on the floor that can make you trip. What can I do in the kitchen?  Clean up any spills right away.  Avoid walking on wet floors.  Keep items that you use a lot in easy-to-reach places.  If you need to reach something above you, use a strong step stool that has a grab bar.  Keep electrical cords out of the way.  Do not use floor polish or wax that makes floors slippery. If you must use wax, use non-skid floor wax.  Do not have throw rugs and other things on the floor that can make you trip. What can I do with my stairs?  Do  not leave any items on the stairs.  Make sure that there are handrails on both sides of the stairs and use them. Fix handrails that are broken or loose. Make sure that handrails are as long as the stairways.  Check any carpeting to make sure that it is firmly attached to the stairs. Fix any carpet that is loose or worn.  Avoid having throw rugs at the top or bottom of the stairs. If you do have throw rugs, attach them to the floor with carpet tape.  Make sure that you have a light switch at the top of the stairs and the bottom of the stairs. If you do not have them, ask someone to add them for you. What else can I do to help prevent falls?  Wear shoes that:  Do not have high heels.  Have rubber bottoms.  Are comfortable and fit you well.  Are closed at the toe. Do not wear sandals.  If you use a stepladder:  Make sure that it is fully opened. Do not climb a closed stepladder.  Make  sure that both sides of the stepladder are locked into place.  Ask someone to hold it for you, if possible.  Clearly mark and make sure that you can see:  Any grab bars or handrails.  First and last steps.  Where the edge of each step is.  Use tools that help you move around (mobility aids) if they are needed. These include:  Canes.  Walkers.  Scooters.  Crutches.  Turn on the lights when you go into a dark area. Replace any light bulbs as soon as they burn out.  Set up your furniture so you have a clear path. Avoid moving your furniture around.  If any of your floors are uneven, fix them.  If there are any pets around you, be aware of where they are.  Review your medicines with your doctor. Some medicines can make you feel dizzy. This can increase your chance of falling. Ask your doctor what other things that you can do to help prevent falls. This information is not intended to replace advice given to you by your health care provider. Make sure you discuss any questions you have with your health care provider. Document Released: 04/14/2009 Document Revised: 11/24/2015 Document Reviewed: 07/23/2014 Elsevier Interactive Patient Education  2017 [employer].
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
## 2019-11-28 ENCOUNTER — Encounter: Payer: Self-pay | Admitting: Internal Medicine

## 2019-11-28 DIAGNOSIS — R519 Headache, unspecified: Secondary | ICD-10-CM | POA: Insufficient documentation

## 2019-11-28 DIAGNOSIS — H9192 Unspecified hearing loss, left ear: Secondary | ICD-10-CM | POA: Insufficient documentation

## 2019-11-28 NOTE — Assessment & Plan Note (Signed)
With seasonal flare, for depomedrol im 80 mg,  to f/u any worsening symptoms or concerns

## 2019-11-28 NOTE — Assessment & Plan Note (Signed)
Chronic mild persistent, declines change in tx for now

## 2019-11-28 NOTE — Assessment & Plan Note (Signed)
Etiology unclear, Exam otherwise benign, to check labs as documented, follow with expectant management  

## 2019-11-28 NOTE — Assessment & Plan Note (Addendum)
Improved s/p irrigation left canal wax iompaction  I spent 41 minutes in preparing to see the patient by review of recent labs, imaging and procedures, obtaining and reviewing separately obtained history, communicating with the patient and family or caregiver, ordering medications, tests or procedures, and documenting clinical information in the EHR including the differential Dx, treatment, and any further evaluation and other management of acute left hearing loss, allergic rhinitis and conjunctivitis, chronic right sided HA, eustach tube dysfxn, fatigeu, preDM, vertigo, anxiety

## 2019-11-28 NOTE — Assessment & Plan Note (Signed)
Also to improve with steroid tx

## 2019-11-28 NOTE — Assessment & Plan Note (Signed)
Benign, for meclizine prn

## 2019-11-28 NOTE — Assessment & Plan Note (Signed)
stable overall by history and exam, recent data reviewed with pt, and pt to continue medical treatment as before,  to f/u any worsening symptoms or concerns  

## 2019-11-28 NOTE — Assessment & Plan Note (Signed)
Ok for mucinex bid prn °

## 2019-11-28 NOTE — Assessment & Plan Note (Signed)
Benign, ? Neuritic, consider gabapentin but declines,  to f/u any worsening symptoms or concerns

## 2019-12-07 ENCOUNTER — Telehealth: Payer: Self-pay | Admitting: Internal Medicine

## 2019-12-07 NOTE — Telephone Encounter (Signed)
° ° °  Patient calling to discuss taking OTC vitamins. Patient states she is taking several vitamins but wants to confirm if she is taking proper dosage

## 2019-12-11 NOTE — Telephone Encounter (Signed)
Spoke with pt and her concerns about her OTC meds were addressed. Pt has no further questions or concerns at this time.

## 2019-12-15 ENCOUNTER — Encounter: Payer: Self-pay | Admitting: Internal Medicine

## 2020-02-03 ENCOUNTER — Encounter: Payer: Self-pay | Admitting: Internal Medicine

## 2020-02-03 ENCOUNTER — Other Ambulatory Visit: Payer: Self-pay

## 2020-02-03 ENCOUNTER — Ambulatory Visit (INDEPENDENT_AMBULATORY_CARE_PROVIDER_SITE_OTHER): Payer: Medicare HMO | Admitting: Internal Medicine

## 2020-02-03 VITALS — BP 150/86 | HR 75 | Temp 98.1°F | Ht 62.0 in | Wt 196.0 lb

## 2020-02-03 DIAGNOSIS — E559 Vitamin D deficiency, unspecified: Secondary | ICD-10-CM | POA: Diagnosis not present

## 2020-02-03 DIAGNOSIS — E785 Hyperlipidemia, unspecified: Secondary | ICD-10-CM | POA: Diagnosis not present

## 2020-02-03 DIAGNOSIS — R21 Rash and other nonspecific skin eruption: Secondary | ICD-10-CM | POA: Diagnosis not present

## 2020-02-03 DIAGNOSIS — E538 Deficiency of other specified B group vitamins: Secondary | ICD-10-CM

## 2020-02-03 DIAGNOSIS — R42 Dizziness and giddiness: Secondary | ICD-10-CM

## 2020-02-03 DIAGNOSIS — R7303 Prediabetes: Secondary | ICD-10-CM

## 2020-02-03 DIAGNOSIS — E119 Type 2 diabetes mellitus without complications: Secondary | ICD-10-CM | POA: Diagnosis not present

## 2020-02-03 MED ORDER — MECLIZINE HCL 12.5 MG PO TABS: 12.5000 mg | ORAL_TABLET | Freq: Three times a day (TID) | ORAL | 1 refills | Status: AC | PRN

## 2020-02-03 MED ORDER — TRIAMCINOLONE ACETONIDE 0.1 % EX CREA: 1.0000 "application " | TOPICAL_CREAM | Freq: Two times a day (BID) | CUTANEOUS | 0 refills | Status: AC

## 2020-02-03 NOTE — Assessment & Plan Note (Signed)
stable overall by history and exam, recent data reviewed with pt, and pt to continue medical treatment as before,  to f/u any worsening symptoms or concerns  

## 2020-02-03 NOTE — Progress Notes (Signed)
Subjective:    Patient ID: Debra Atkinson, female    DOB: 12/29/1945, 74 y.o.   MRN: [id number]  HPI  Here to f/u; overall doing ok,  Pt denies chest pain, increasing sob or doe, wheezing, orthopnea, PND, increased LE swelling, palpitations, dizziness or syncope.  Pt denies new neurological symptoms such as new headache, or facial or extremity weakness or numbness.  Pt denies polydipsia, polyuria  Now unvaccinated for covid.  Also has ongoing tinnitus, and vertigo not improving.  Takes ibuprofen prn back and knee pain.  Also gets a rash red itchy non painful non raised to legs after wearing a deceased womans inherited pants of nylon/rayon/spandex that she really likes. Past Medical History:  Diagnosis Date  . Cerebrovascular disease 02/05/2019  . Concussion '06, '11  . Diabetes mellitus    diet management  . Family history of colon cancer 02/28/2016  . Fibromyalgia 10/08/2013  . Hyperlipidemia    diet managed  . Varicella    Past Surgical History:  Procedure Laterality Date  . APPENDECTOMY  1979  . [place]  . CHOLECYSTECTOMY  1979   laparotomy    reports that she has never smoked. She has never used smokeless tobacco. She reports that she does not drink alcohol and does not use drugs. family history includes COPD in her mother; Cancer (age of onset: 28) in her mother; Diabetes in her maternal grandfather and mother; Heart disease in her father and mother; Rheumatic fever in her father. Allergies  Allergen Reactions  . Methylprednisolone Itching   Current Outpatient Medications on File Prior to Visit  Medication Sig Dispense Refill  . cetirizine (ZYRTEC) 10 MG tablet Take 1 tablet (10 mg total) by mouth daily. 90 tablet 3  . Cholecalciferol ([employer] D3) 50 MCG (2000 UT) CAPS Take by mouth.     . Cyanocobalamin ([employer] VITAMIN B-12 PO) Take 2,500 tablets by mouth daily.     . diazepam (VALIUM) 5 MG tablet Take 1 tablet (5 mg total) by mouth every 12 (twelve) hours as needed  for anxiety. 10 tablet 2  . guaiFENesin (MUCINEX) 600 MG 12 hr tablet Take 2 tablets (1,200 mg total) by mouth 2 (two) times daily as needed. 60 tablet 1  . ibuprofen (ADVIL,MOTRIN) 200 MG tablet Take 200 mg by mouth every 6 (six) hours as needed.    . Multiple Vitamins-Minerals (MULTIVITAMIN WITH MINERALS) tablet Take 1 tablet by mouth daily.    [name] oxybutynin (DITROPAN-XL) 10 MG 24 hr tablet oxybutynin chloride ER 10 mg tablet,extended release 24 hr    . predniSONE (DELTASONE) 10 MG tablet 3 tabs by mouth per day for 3 days,2abs per day for 3 days,1tab per day for 3 days 18 tablet 0  . rosuvastatin (CRESTOR) 40 MG tablet Take 1 tablet (40 mg total) by mouth daily. 90 tablet 3  . sulfamethoxazole-trimethoprim (BACTRIM DS) 800-160 MG tablet Take 1 tablet by mouth 2 (two) times daily. 20 tablet 0  . tiZANidine (ZANAFLEX) 4 MG tablet Take 1 tablet (4 mg total) by mouth every 6 (six) hours as needed for muscle spasms. 30 tablet 1  . traMADol (ULTRAM) 50 MG tablet Take 1 tablet (50 mg total) by mouth every 6 (six) hours as needed. 30 tablet 0  . triamcinolone (NASACORT AQ) 55 MCG/ACT AERO nasal inhaler Place 2 sprays into the nose daily. 3 Inhaler 3   No current facility-administered medications on file prior to visit.   Review of Systems All otherwise neg  per pt    Objective:   Physical Exam BP (!) 150/86 (BP Location: Left Arm, Patient Position: Sitting, Cuff Size: Large)   Pulse 75   Temp 98.1 F (36.7 C) (Oral)   Ht 5\' 2"  (1.575 m)   Wt 196 lb (88.9 kg)   SpO2 97%   BMI 35.85 kg/m  VS noted,  Constitutional: Pt appears in NAD HENT: Head: NCAT.  Right Ear: External ear normal.  Left Ear: External ear normal.  Eyes: . Pupils are equal, round, and reactive to light. Conjunctivae and EOM are normal Nose: without d/c or deformity Neck: Neck supple. Gross normal ROM Cardiovascular: Normal rate and regular rhythm.   Pulmonary/Chest: Effort normal and breath sounds without rales or  wheezing.  Abd:  Soft, NT, ND, + BS, no organomegaly Neurological: Pt is alert. At baseline orientation, motor grossly intact Skin: Skin is warm. No current rashes, other new lesions, no LE edema Psychiatric: Pt behavior is normal without agitation , 1+ nervous All otherwise neg per pt Lab Results  Component Value Date   WBC 7.9 09/07/2019   HGB 12.8 09/07/2019   HCT 38.6 09/07/2019   PLT 326.0 09/07/2019   GLUCOSE 104 (H) 09/07/2019   CHOL 275 (H) 08/06/2019   TRIG 189.0 (H) 08/06/2019   HDL 48.60 08/06/2019   LDLDIRECT 189.0 05/08/2018   LDLCALC 188 (H) 08/06/2019   ALT 20 09/07/2019   AST 16 09/07/2019   NA 141 09/07/2019   K 4.0 09/07/2019   CL 105 09/07/2019   CREATININE 0.95 09/07/2019   BUN 16 09/07/2019   CO2 32 09/07/2019   TSH 2.44 08/06/2019   INR 0.92 05/24/2011   HGBA1C 6.1 08/06/2019   MICROALBUR 0.9 10/26/2015      Assessment & Plan:

## 2020-02-03 NOTE — Patient Instructions (Signed)
Please take all new medication as prescribed - the steroid cream  Ok to take OTC Benadryl in the future as well for rash and itching as needed  Please no longer wear the pants made of rayon, nylon or spandex due to allergy to this  You will be contacted regarding the referral for: Vestibular rehabilitation (a type of physical therapy for vertigo)  OK to take the reduced dose of antivert since the higher dose may make things worse as you said  OK to take the statin as this would not be related at all to the rash or tinnitus or vertigo  Please continue all other medications as before, and refills have been done if requested.  Please have the pharmacy call with any other refills you may need.  Please continue your efforts at being more active, low cholesterol diet, and weight control.  Please keep your appointments with your specialists as you may have planned  Please make an Appointment to return in 6 months, or sooner if needed, also with Lab Appointment for testing done 3-5 days before at the [facility] (so this is for TWO appointments - please see the scheduling desk as you leave)

## 2020-02-03 NOTE — Assessment & Plan Note (Signed)
Persistent despite meds, for vestibular rehab

## 2020-02-03 NOTE — Assessment & Plan Note (Addendum)
Urged pt to not wear the pants that cause the rash, for triam cr, benadry prn  I spent 31 minutes in preparing to see the patient by review of recent labs, imaging and procedures, obtaining and reviewing separately obtained history, communicating with the patient and family or caregiver, ordering medications, tests or procedures, and documenting clinical information in the EHR including the differential Dx, treatment, and any further evaluation and other management of rash, vertigo, hld, hyperglycemia, vit d and b12 deficiency

## 2020-02-03 NOTE — Assessment & Plan Note (Signed)
For oral replacement 

## 2020-02-08 NOTE — Telephone Encounter (Signed)
New Message:   Pt is calling and states she would like Dr. [name] Reichmann to respond to her MyChart message. Please advise.

## 2020-03-14 DIAGNOSIS — Z124 Encounter for screening for malignant neoplasm of cervix: Secondary | ICD-10-CM | POA: Diagnosis not present

## 2020-03-14 DIAGNOSIS — Z01419 Encounter for gynecological examination (general) (routine) without abnormal findings: Secondary | ICD-10-CM | POA: Diagnosis not present

## 2020-03-14 DIAGNOSIS — Z6836 Body mass index (BMI) 36.0-36.9, adult: Secondary | ICD-10-CM | POA: Diagnosis not present

## 2020-05-02 ENCOUNTER — Telehealth: Payer: Self-pay | Admitting: Internal Medicine

## 2020-05-03 NOTE — Telephone Encounter (Signed)
Error

## 2020-05-04 ENCOUNTER — Telehealth: Payer: Medicare HMO | Admitting: Internal Medicine

## 2020-05-04 ENCOUNTER — Encounter: Payer: Self-pay | Admitting: Internal Medicine

## 2020-05-04 NOTE — Patient Instructions (Signed)
none

## 2020-05-04 NOTE — Progress Notes (Signed)
Patient ID: Debra Atkinson, female   DOB: [birth year], 74 y.o.   MRN: [id number]   Pt not seen today

## 2020-05-05 ENCOUNTER — Telehealth (INDEPENDENT_AMBULATORY_CARE_PROVIDER_SITE_OTHER): Payer: Medicare HMO | Admitting: Adult Health

## 2020-05-05 DIAGNOSIS — J302 Other seasonal allergic rhinitis: Secondary | ICD-10-CM

## 2020-05-05 NOTE — Progress Notes (Signed)
Virtual Visit via Telephone Note  I connected with Debra Atkinson on 05/05/20 at 11:30 AM EDT by telephone and verified that I am speaking with the correct person using two identifiers.   I discussed the limitations, risks, security and privacy concerns of performing an evaluation and management service by telephone and the availability of in person appointments. I also discussed with the patient that there may be a patient responsible charge related to this service. The patient expressed understanding and agreed to proceed.  Location patient: home Location provider: work or home office Participants present for the call: patient, provider Patient did not have a visit in the prior 7 days to address this/these issue(s).   History of Present Illness: Debra Atkinson 74-year-old female who is being evaluated today for an acute issue.  Her symptoms have been present for 3 to 4 weeks.  Symptoms include rhinorrhea with clear discharge, postnasal drip, and intermittent cough.  She denies sinus pain or pressure, fevers, chills, or feeling acutely ill.  She has been using Flonase and takes Zyrtec intermittently.  She is wondering if she needs to be on antibiotic or if there is anything else that she can take to help with her symptoms   Observations/Objective: Patient sounds cheerful and well on the phone. I do not appreciate any SOB. Speech and thought processing are grossly intact. Patient reported vitals:  Assessment and Plan: 1. Seasonal allergies -Advised to start taking Zyrtec on a daily basis instead of just as needed.  Follow-up if no improvement.   Follow Up Instructions:  I did not refer this patient for an OV in the next 24 hours for this/these issue(s).  I discussed the assessment and treatment plan with the patient. The patient was provided an opportunity to ask questions and all were answered. The patient agreed with the plan and demonstrated an understanding of the instructions.   The  patient was advised to call back or seek an in-person evaluation if the symptoms worsen or if the condition fails to improve as anticipated.  I provided 11 minutes of non-face-to-face time during this encounter.   Dorothyann Peng, NP

## 2020-05-09 ENCOUNTER — Encounter: Payer: Self-pay | Admitting: Internal Medicine

## 2020-05-09 NOTE — Telephone Encounter (Signed)
Error no note needed °

## 2020-05-10 ENCOUNTER — Telehealth (INDEPENDENT_AMBULATORY_CARE_PROVIDER_SITE_OTHER): Payer: Medicare HMO | Admitting: Adult Health

## 2020-05-10 ENCOUNTER — Other Ambulatory Visit: Payer: Self-pay

## 2020-05-10 ENCOUNTER — Encounter: Payer: Self-pay | Admitting: Adult Health

## 2020-05-10 DIAGNOSIS — H9313 Tinnitus, bilateral: Secondary | ICD-10-CM | POA: Diagnosis not present

## 2020-05-10 DIAGNOSIS — B370 Candidal stomatitis: Secondary | ICD-10-CM | POA: Diagnosis not present

## 2020-05-10 MED ORDER — NYSTATIN 100000 UNIT/ML MT SUSP: 5.0000 mL | Freq: Three times a day (TID) | OROMUCOSAL | 0 refills | Status: DC

## 2020-05-10 NOTE — Progress Notes (Signed)
Virtual Visit via Telephone Note  I connected with Debra Atkinson on 05/10/20 at 11:00 AM EST by telephone and verified that I am speaking with the correct person using two identifiers.   I discussed the limitations, risks, security and privacy concerns of performing an evaluation and management service by telephone and the availability of in person appointments. I also discussed with the patient that there may be a patient responsible charge related to this service. The patient expressed understanding and agreed to proceed.  Location patient: home Location provider: work or home office Participants present for the call: patient, provider Patient did not have a visit in the prior 7 days to address this/these issue(s).   History of Present Illness: This is a 74 year old female evaluated 5 days ago for a virtual visit and was diagnosed with suspected seasonal allergies.  Her symptoms at this time included rhinorrhea with clear discharge, postnasal drip, and intermittent cough.  She denied sinus pain or pressure, fever, chills, or feeling acutely ill.  She had been using Flonase and taking Zyrtec intermittently.  She was advised at this time to continue with Flonase and start taking Zyrtec on a daily basis.  Today she reports that she continues to have clear rhinorrhea with postnasal drip and intermittent cough.  Unfortunately over the last few days she is also developed a white coating on her tongue and the back of her throat which is sore when she swallows.  She has stopped Flonase once this started.  She continues to deny fevers, chills, sinus pain and pressure, or feeling acutely ill  Additionally, she is also developed a ringing sensation in her ears over the last few days.  Feels as though her ears are stopped up.   Observations/Objective: Patient sounds cheerful and well on the phone. I do not appreciate any SOB. Speech and thought processing are grossly intact. Patient reported  vitals:  Assessment and Plan: 1. Oral thrush  - nystatin (MYCOSTATIN) 100000 UNIT/ML suspension; Take 5 mLs (500,000 Units total) by mouth in the morning, at noon, and at bedtime. Gargle, swish and spit  Dispense: 60 mL; Refill: 0  2. Tinnitus aurium, bilateral - Advised she can use Afrin as directed for no longer than three days  - Follow up PRN    Follow Up Instructions:  I did not refer this patient for an OV in the next 24 hours for this/these issue(s).  I discussed the assessment and treatment plan with the patient. The patient was provided an opportunity to ask questions and all were answered. The patient agreed with the plan and demonstrated an understanding of the instructions.   The patient was advised to call back or seek an in-person evaluation if the symptoms worsen or if the condition fails to improve as anticipated.  I provided 11 minutes of non-face-to-face time during this encounter.   Dorothyann Peng, NP

## 2020-05-11 DIAGNOSIS — Z1231 Encounter for screening mammogram for malignant neoplasm of breast: Secondary | ICD-10-CM | POA: Diagnosis not present

## 2020-06-21 ENCOUNTER — Encounter: Payer: Self-pay | Admitting: Family Medicine

## 2020-06-21 ENCOUNTER — Telehealth: Payer: Medicare HMO | Admitting: Family Medicine

## 2020-06-23 ENCOUNTER — Encounter: Payer: Self-pay | Admitting: Internal Medicine

## 2020-07-04 ENCOUNTER — Ambulatory Visit: Payer: Medicare HMO | Admitting: Internal Medicine

## 2020-07-04 ENCOUNTER — Encounter: Payer: Self-pay | Admitting: Internal Medicine

## 2020-07-04 ENCOUNTER — Telehealth: Payer: Medicare HMO | Admitting: Internal Medicine

## 2020-07-04 DIAGNOSIS — I679 Cerebrovascular disease, unspecified: Secondary | ICD-10-CM

## 2020-07-04 NOTE — Patient Instructions (Signed)
none

## 2020-07-04 NOTE — Progress Notes (Signed)
Pt not seen.

## 2020-07-13 ENCOUNTER — Other Ambulatory Visit: Payer: Self-pay

## 2020-07-13 ENCOUNTER — Telehealth: Payer: Self-pay | Admitting: Internal Medicine

## 2020-07-13 ENCOUNTER — Emergency Department (HOSPITAL_COMMUNITY)
Admission: EM | Admit: 2020-07-13 | Discharge: 2020-07-14 | Disposition: A | Discharge status: Home / Self Care | Payer: Medicare HMO | Attending: Emergency Medicine | Admitting: Emergency Medicine

## 2020-07-13 ENCOUNTER — Emergency Department (HOSPITAL_COMMUNITY): Payer: Medicare HMO

## 2020-07-13 ENCOUNTER — Encounter (HOSPITAL_COMMUNITY): Payer: Self-pay | Admitting: Emergency Medicine

## 2020-07-13 DIAGNOSIS — I700 Atherosclerosis of aorta: Secondary | ICD-10-CM | POA: Diagnosis not present

## 2020-07-13 DIAGNOSIS — E119 Type 2 diabetes mellitus without complications: Secondary | ICD-10-CM | POA: Diagnosis not present

## 2020-07-13 DIAGNOSIS — I10 Essential (primary) hypertension: Secondary | ICD-10-CM | POA: Diagnosis not present

## 2020-07-13 DIAGNOSIS — U071 COVID-19: Secondary | ICD-10-CM | POA: Diagnosis not present

## 2020-07-13 DIAGNOSIS — Z8673 Personal history of transient ischemic attack (TIA), and cerebral infarction without residual deficits: Secondary | ICD-10-CM | POA: Diagnosis not present

## 2020-07-13 DIAGNOSIS — R0981 Nasal congestion: Secondary | ICD-10-CM | POA: Diagnosis present

## 2020-07-13 LAB — CBC
HCT: 40.4 % (ref 36.0–46.0)
Hemoglobin: 13.1 g/dL (ref 12.0–15.0)
MCH: 27.9 pg (ref 26.0–34.0)
MCHC: 32.4 g/dL (ref 30.0–36.0)
MCV: 86.1 fL (ref 80.0–100.0)
Platelets: 263 10*3/uL (ref 150–400)
RBC: 4.69 MIL/uL (ref 3.87–5.11)
RDW: 13.5 % (ref 11.5–15.5)
WBC: 4 10*3/uL (ref 4.0–10.5)
nRBC: 0 % (ref 0.0–0.2)

## 2020-07-13 LAB — TROPONIN I (HIGH SENSITIVITY): Troponin I (High Sensitivity): 5 ng/L (ref <18)

## 2020-07-13 LAB — BASIC METABOLIC PANEL
Anion gap: 10 (ref 5–15)
BUN: 11 mg/dL (ref 8–23)
CO2: 27 mmol/L (ref 22–32)
Calcium: 9.3 mg/dL (ref 8.9–10.3)
Chloride: 102 mmol/L (ref 98–111)
Creatinine, Ser: 0.77 mg/dL (ref 0.44–1.00)
GFR, Estimated: >60 mL/min (ref >60)
Glucose, Bld: 117 mg/dL — ABNORMAL HIGH (ref 70–99)
Potassium: 3.7 mmol/L (ref 3.5–5.1)
Sodium: 139 mmol/L (ref 135–145)

## 2020-07-13 NOTE — ED Triage Notes (Signed)
Pt presents with multiple complaints. C/o back and chest pain, nasal congestion/runny nose, fevers at home, and an earache.

## 2020-07-13 NOTE — Telephone Encounter (Signed)
She said she is using Zyrtec, Flonase and Mucinex. She said that the drainage feels like it is in her chest. Do you want me to schedule her a virtual visit?

## 2020-07-13 NOTE — Telephone Encounter (Signed)
[place] for virtual, and ask pt to be covid testing if not done recently

## 2020-07-13 NOTE — Telephone Encounter (Signed)
Patient called and was wondering if something could be called in for her allergies. She said she is having some drainage. She said she does take Zyrtec and that is not helping her.

## 2020-07-13 NOTE — Telephone Encounter (Signed)
Actually one of the best things to add is also OTC - the nasacort 2 spray per side at least once per day, so ok to try this with the zyrtec

## 2020-07-14 ENCOUNTER — Telehealth: Payer: Self-pay | Admitting: Internal Medicine

## 2020-07-14 LAB — RESP PANEL BY RT-PCR (FLU A&B, COVID) ARPGX2
Influenza A by PCR: NEGATIVE
Influenza B by PCR: NEGATIVE
SARS Coronavirus 2 by RT PCR: POSITIVE — AB

## 2020-07-14 LAB — TROPONIN I (HIGH SENSITIVITY): Troponin I (High Sensitivity): 5 ng/L (ref <18)

## 2020-07-14 NOTE — Discharge Instructions (Signed)
It is most likely that your symptoms are all due to your COVID-19 infection.   Your EKG does not show any new changes that would indicate a problem with your heart and your blood tests correspond with no signs of damage to your heart.   Your chest xray did not have any signs of pneumonia.   Your COVID symptoms appear to be mild and appropriate for outpatient treatment.   Please continue the medications recommended by your primary doctor to help with your congestion and drainage.   We have referred you for outpatient treatment with Remdesivir for your COVID diagnosis. Please be on the lookout for a call from this group in order to schedule an appointment for your treatment.   If you experience chest pain, SOB, dizziness or become unable to drink fluids orally, you should seek medical attention urgently.

## 2020-07-14 NOTE — ED Provider Notes (Addendum)
[facility] EMERGENCY DEPARTMENT Provider Note   CSN: [id number] Arrival date & time: 07/13/20  2231     History Chief Complaint  Patient presents with  . Chest Pain    Debra Atkinson is a 75 y.o. female.  Patient is a 75 year old female presenting with congestion and back pain. Patient has not received any COVID-19 vaccinations. Patient states that 2 days ago she began to experience chills and decided to be tested for COVID-19. She reports going to church on Sunday and was later informed that several members were sick after attending a funeral. She states that she began to have some heaviness in the upper aspect message that was not and did not impact her ability to take a deep breath. She reports having mild regular cough. She denies any shortness of she denies any sore throat patient states that she does feel slightly tired not this is excessively fatigued. She denies any nausea vomiting or abdominal pain. She reports taking her temperature at home was 100 F. She denies presence of chest pain and denies any history of cardiac diseases, denies GERD symptoms as well as hx of HTN and reports only taking vitamins and medications for sinus/allergy symptoms.        Past Medical History:  Diagnosis Date  . Cerebrovascular disease 02/05/2019  . Concussion '06, '11  . Diabetes mellitus    diet management  . Family history of colon cancer 02/28/2016  . Fibromyalgia 10/08/2013  . Hyperlipidemia    diet managed  . Varicella     Patient Active Problem List   Diagnosis Date Noted  . Chronic right-sided headache 11/28/2019  . Acute hearing loss, left 11/28/2019  . Anxiety 09/27/2019  . Facial cellulitis 09/25/2019  . Abdominal pain, right lower quadrant 09/07/2019  . OAB (overactive bladder) 08/06/2019  . Right sided sciatica 08/06/2019  . Pain of left thumb 08/06/2019  . Cerebrovascular disease 02/05/2019  . Rash and nonspecific skin eruption 01/19/2019  . Right  knee pain 09/01/2018  . Great toe pain, right 09/01/2018  . Acute pharyngitis 02/24/2018  . Splinter in skin 01/22/2018  . Right serous otitis media 11/28/2017  . Vertigo 11/28/2017  . Bilateral leg pain 11/28/2017  . Dysuria 07/06/2017  . B12 deficiency 05/07/2017  . Vitamin D deficiency 05/07/2017  . Fatigue 02/13/2017  . Lateral epicondylitis of left elbow 10/31/2016  . Toe pain, left 10/31/2016  . Posterior neck pain 10/31/2016  . 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 Thyroid nodule 06/27/2016  . Pain in both knees 06/27/2016  . Mass of right side of neck 04/29/2016  . Dizziness 03/07/2016  . Family history of colon cancer 02/28/2016  . Constipation 02/28/2016  . UTI (urinary tract infection) 07/29/2015  . Cough 05/05/2015  . BPV (benign positional vertigo) 11/09/2014  . Atypical chest pain 10/08/2014  . Upper airway cough syndrome 06/08/2014  . Sinusitis, chronic 04/09/2014  . Allergic conjunctivitis 04/09/2014  . Urinary frequency 02/18/2014  . Eustachian tube dysfunction 12/04/2013  . Fibromyalgia 10/08/2013  . Low back pain 09/18/2012  . Allergic rhinitis 09/18/2012  . Need for prophylactic vaccination and inoculation against influenza 04/28/2012  . Encounter for well adult exam with abnormal findings 04/28/2012  . Pre-diabetes 04/25/2011  . Hyperlipidemia     Past Surgical History:  Procedure Laterality Date  . APPENDECTOMY  1979  . [place]  . CHOLECYSTECTOMY  1979   laparotomy     OB History   No  obstetric history on file.     Family History  Problem Relation Age of Onset  . Diabetes Mother   . Heart disease Mother        CHF  . Cancer Mother 44       colon cancer  . COPD Mother   . Heart disease Father        CAD/MI  . Rheumatic fever Father   . Diabetes Maternal Grandfather     Social History   Tobacco Use  . Smoking status: Never Smoker  . Smokeless tobacco: Never Used  Substance Use Topics  .  Alcohol use: No    Alcohol/week: 0.0 standard drinks  . Drug use: No    Home Medications Prior to Admission medications   Medication Sig Start Date End Date Taking? Authorizing Provider  cetirizine (ZYRTEC) 10 MG tablet Take 1 tablet (10 mg total) by mouth daily. 11/27/19   Biagio Borg, MD  Cholecalciferol ([employer] D3) 50 MCG (2000 UT) CAPS Take by mouth.     [provider]  Cyanocobalamin ([employer]S VITAMIN B-12 PO) Take 2,500 tablets by mouth daily.     [provider]  diazepam (VALIUM) 5 MG tablet Take 1 tablet (5 mg total) by mouth every 12 (twelve) hours as needed for anxiety. 05/07/19   Biagio Borg, MD  guaiFENesin (MUCINEX) 600 MG 12 hr tablet Take 2 tablets (1,200 mg total) by mouth 2 (two) times daily as needed. 11/27/19   Biagio Borg, MD  ibuprofen (ADVIL,MOTRIN) 200 MG tablet Take 200 mg by mouth every 6 (six) hours as needed.    [provider]  meclizine (ANTIVERT) 12.5 MG tablet Take 1 tablet (12.5 mg total) by mouth 3 (three) times daily as needed for dizziness. 02/03/20 02/02/21  Biagio Borg, MD  Multiple Vitamins-Minerals (MULTIVITAMIN WITH MINERALS) tablet Take 1 tablet by mouth daily.    [provider]  nystatin (MYCOSTATIN) 100000 UNIT/ML suspension Take 5 mLs (500,000 Units total) by mouth in the morning, at noon, and at bedtime. Gargle, swish and spit 05/10/20   Nafziger, Tommi Rumps, NP  oxybutynin (DITROPAN-XL) 10 MG 24 hr tablet oxybutynin chloride ER 10 mg tablet,extended release 24 hr    [provider]  rosuvastatin (CRESTOR) 40 MG tablet Take 1 tablet (40 mg total) by mouth daily. 11/27/19   Biagio Borg, MD  tiZANidine (ZANAFLEX) 4 MG tablet Take 1 tablet (4 mg total) by mouth every 6 (six) hours as needed for muscle spasms. 10/31/16   Biagio Borg, MD  traMADol (ULTRAM) 50 MG tablet Take 1 tablet (50 mg total) by mouth every 6 (six) hours as needed. 08/06/19   Biagio Borg, MD  triamcinolone (NASACORT AQ) 55 MCG/ACT AERO nasal inhaler  Place 2 sprays into the nose daily. 11/27/19   Biagio Borg, MD  triamcinolone cream (KENALOG) 0.1 % Apply 1 application topically 2 (two) times daily. 02/03/20 02/02/21  Biagio Borg, MD    Allergies    Methylprednisolone  Review of Systems   Review of Systems  Constitutional: Positive for chills.  HENT: Positive for congestion, postnasal drip and sneezing. Negative for ear pain, hearing loss, rhinorrhea and sore throat.   Eyes: Negative for pain.  Respiratory: Negative for shortness of breath and wheezing.   Cardiovascular: Negative for chest pain and palpitations.  Gastrointestinal: Negative for abdominal pain, diarrhea, nausea and vomiting.  Musculoskeletal: Positive for back pain.  Neurological: Negative for weakness.    Physical Exam Updated  Vital Signs BP (!) 141/99   Pulse (!) 117   Temp 98.7 F (37.1 C) (Oral)   Resp 18   SpO2 95%   Physical Exam Constitutional:      General: She is not in acute distress.    Appearance: She is obese. She is not toxic-appearing or diaphoretic.  HENT:     Head: Normocephalic and atraumatic.  Eyes:     Extraocular Movements: Extraocular movements intact.     Pupils: Pupils are equal, round, and reactive to light.  Cardiovascular:     Rate and Rhythm: Regular rhythm. Tachycardia present.     Heart sounds: No murmur heard.   Pulmonary:     Effort: Pulmonary effort is normal. No tachypnea, accessory muscle usage or respiratory distress.     Breath sounds: Normal breath sounds. No decreased breath sounds, wheezing, rhonchi or rales.  Chest:     Chest wall: No tenderness or crepitus.  Abdominal:     General: Bowel sounds are normal.     Tenderness: There is no abdominal tenderness. There is no rebound.  Musculoskeletal:     Cervical back: Neck supple.     Right lower leg: No edema.  Lymphadenopathy:     Cervical: No cervical adenopathy.  Skin:    Findings: No rash.  Neurological:     Mental Status: She is oriented to person,  place, and time.     ED Results / Procedures / Treatments   Labs (all labs ordered are listed, but only abnormal results are displayed) Labs Reviewed  RESP PANEL BY RT-PCR (FLU A&B, COVID) ARPGX2 - Abnormal; Notable for the following components:      Result Value   SARS Coronavirus 2 by RT PCR POSITIVE (*)    All other components within normal limits  BASIC METABOLIC PANEL - Abnormal; Notable for the following components:   Glucose, Bld 117 (*)    All other components within normal limits  CBC  TROPONIN I (HIGH SENSITIVITY)  TROPONIN I (HIGH SENSITIVITY)    EKG EKG Interpretation  Date/Time:  Wednesday July 13 2020 22:40:46 EST Ventricular Rate:  94 PR Interval:  166 QRS Duration: 72 QT Interval:  334 QTC Calculation: 417 R Axis:   13 Text Interpretation: Normal sinus rhythm Cannot rule out Anterior infarct , age undetermined Abnormal ECG When compared with ECG of 07/26/2015, No significant change was found Confirmed by Delora Fuel ([id number]) on 07/14/2020 12:44:56 AM   Radiology DG Chest Portable 1 View  Result Date: 07/13/2020 CLINICAL DATA:  COVID like symptoms. EXAM: PORTABLE CHEST 1 VIEW COMPARISON:  May 08, 2018. FINDINGS: The heart size and mediastinal contours are within normal limits. Both lungs are clear. The visualized skeletal structures are unremarkable. Aortic calcifications are noted. IMPRESSION: No active disease. Electronically Signed   By: Constance Holster M.D.   On: 07/13/2020 23:04   Procedures Procedures (including critical care time)  Medications Ordered in ED Medications - No data to display  ED Course  I have reviewed the triage vital signs and the nursing notes.  Pertinent labs & imaging results that were available during my care of the patient were reviewed by me and considered in my medical decision making (see chart for details).  Clinical Course as of 07/14/20 0840  Thu Jul 14, 2020  3149 EKG 12-Lead [MS]    Clinical Course User  Index [MS] Eulis Foster, MD   MDM Rules/Calculators/A&P  Debra Atkinson is a 75 y.o. female presenting with postnasal drip, nasal congestion with chest heaviness with +COVID status. Patient"s vital signs show oxygen saturations 93-95% on RA, normal respiratory rate with no abnormal EKG findings. She is mildly tachycardic with normal troponin levels of 5x2.  At the time of discharge, she was not complaining of chest pain or SOB and was breathing comfortably on RA with saturation as mentioned above. Patient's  Given patient's obesity, non-vaccinated status and mild covid symptoms, she is eligible for outpatient treatment with remdesivir and will be referred for outpatient follow up.  Debra Atkinson was evaluated in Emergency Department on 07/14/2020 for the symptoms described in the history of present illness. She was evaluated in the context of the global COVID-19 pandemic, which necessitated consideration that the patient might be at risk for infection with the SARS-CoV-2 virus that causes COVID-19. Institutional protocols and algorithms that pertain to the evaluation of patients at risk for COVID-19 are in a state of rapid change based on information released by regulatory bodies including the CDC and federal and state organizations. These policies and algorithms were followed during the patient's care in the ED.  Final Clinical Impression(s) / ED Diagnoses Final diagnoses:  [id number]    Rx / DC Orders ED Discharge Orders         Ordered    Ambulatory referral for Covid Treatment       Comments: Ananiah Bourdon, Aug 08, 1945, [id number], [phone]   07/14/20 0830           Simmons-Robinson, Riki Sheer, MD 07/14/20 0840    Eulis Foster, MD 07/14/20 [id number]    Merryl Hacker, MD 07/14/20 [phone]

## 2020-07-14 NOTE — Telephone Encounter (Signed)
I called the patient to set up a virtual appointment and she said she is in the ED and she does have Covid 19. I told her to call the office when she is discharged for a follow up visit.

## 2020-07-14 NOTE — Telephone Encounter (Signed)
[facility] FYI  Caller states she has cough, congestion, drainage; states she is having chest discomfort and back discomfort; symptoms worsened this week. Also states urine has an odor, however denies pain with urination.  [facility] advised: Call PCP now  Per on call, patient needs to go to the ED for evaluation and [facility] go [facility] or [facility]. Caller statesshe doesn't know which hospital she will be going to. States she doesn't like [facility]. Caller verbalizes understanding but is unsure which ED she is going to go to.   Patient went to ED on 1.13.2022

## 2020-07-14 NOTE — ED Notes (Addendum)
Pt's heart rhythm is going back and forth between NSR/ST to A-fib w/RVR.

## 2020-07-15 ENCOUNTER — Telehealth: Payer: Self-pay

## 2020-07-15 ENCOUNTER — Encounter: Payer: Self-pay | Admitting: Family

## 2020-07-15 ENCOUNTER — Telehealth (INDEPENDENT_AMBULATORY_CARE_PROVIDER_SITE_OTHER): Payer: Medicare HMO | Admitting: Internal Medicine

## 2020-07-15 ENCOUNTER — Encounter: Payer: Self-pay | Admitting: Internal Medicine

## 2020-07-15 ENCOUNTER — Other Ambulatory Visit: Payer: Self-pay | Admitting: Family

## 2020-07-15 DIAGNOSIS — R7303 Prediabetes: Secondary | ICD-10-CM

## 2020-07-15 DIAGNOSIS — F419 Anxiety disorder, unspecified: Secondary | ICD-10-CM

## 2020-07-15 DIAGNOSIS — U071 COVID-19: Secondary | ICD-10-CM | POA: Diagnosis not present

## 2020-07-15 MED ORDER — BENZONATATE 100 MG PO CAPS: 100.0000 mg | ORAL_CAPSULE | Freq: Two times a day (BID) | ORAL | 0 refills | Status: DC | PRN

## 2020-07-15 NOTE — Patient Instructions (Signed)
Please take all new medication as prescribed  Please call the monoclonal Ab hotline center to ask for treatment  Please continue all other medications as before, and refills have been done if requested.  Please have the pharmacy call with any other refills you may need.  Please continue your efforts at being more active, low cholesterol diet, and weight control.  Please keep your appointments with your specialists as you may have planned

## 2020-07-15 NOTE — Progress Notes (Signed)
Patient ID: Debra Atkinson, female   DOB: March 30, 1946, 75 y.o.   MRN: [id number]  Virtual Visit via Video Note  I connected with Debra Atkinson on 07/15/20 at  2:20 PM EST by a video enabled telemedicine application and verified that I am speaking with the correct person using two identifiers.  Location of all participant today Patient: at home Provider: at office   I discussed the limitations of evaluation and management by telemedicine and the availability of in person appointments. The patient expressed understanding and agreed to proceed.  History of Present Illness: Pt here to f/u recent diagnosis COVID + yesterday, cxr neg after seen at UC, symptoms ongoing for 3 days with nasal congestion, chills, non prod cough, and myalgias.  Ibuprofen helps.  Very small diarrhea.  No los of taste or smell and no n/v.  Unvaccinated.  Fatigued.  Has been referred to monoclonal Ab clinic and turned them down, wanting to d/w me first.  Asks for cough med.   Pt denies polydipsia, polyuria, Denies worsening depressive symptoms, suicidal ideation, or panic   Past Medical History:  Diagnosis Date  . Cerebrovascular disease 02/05/2019  . Concussion '06, '11  . Diabetes mellitus    diet management  . Family history of colon cancer 02/28/2016  . Fibromyalgia 10/08/2013  . Hyperlipidemia    diet managed  . Varicella    Past Surgical History:  Procedure Laterality Date  . APPENDECTOMY  1979  . [place]  . CHOLECYSTECTOMY  1979   laparotomy    reports that she has never smoked. She has never used smokeless tobacco. She reports that she does not drink alcohol and does not use drugs. family history includes COPD in her mother; Cancer (age of onset: 57) in her mother; Diabetes in her maternal grandfather and mother; Heart disease in her father and mother; Rheumatic fever in her father. Allergies  Allergen Reactions  . Methylprednisolone Itching   Current Outpatient Medications on File Prior  to Visit  Medication Sig Dispense Refill  . cetirizine (ZYRTEC) 10 MG tablet Take 1 tablet (10 mg total) by mouth daily. 90 tablet 3  . Cholecalciferol (CVS D3) 50 MCG (2000 UT) CAPS Take by mouth.     . Cyanocobalamin (CVS VITAMIN B-12 PO) Take 2,500 tablets by mouth daily.     . diazepam (VALIUM) 5 MG tablet Take 1 tablet (5 mg total) by mouth every 12 (twelve) hours as needed for anxiety. 10 tablet 2  . guaiFENesin (MUCINEX) 600 MG 12 hr tablet Take 2 tablets (1,200 mg total) by mouth 2 (two) times daily as needed. 60 tablet 1  . ibuprofen (ADVIL,MOTRIN) 200 MG tablet Take 200 mg by mouth every 6 (six) hours as needed.    . meclizine (ANTIVERT) 12.5 MG tablet Take 1 tablet (12.5 mg total) by mouth 3 (three) times daily as needed for dizziness. 30 tablet 1  . Multiple Vitamins-Minerals (MULTIVITAMIN WITH MINERALS) tablet Take 1 tablet by mouth daily.    [name] nystatin (MYCOSTATIN) 100000 UNIT/ML suspension Take 5 mLs (500,000 Units total) by mouth in the morning, at noon, and at bedtime. Gargle, swish and spit 60 mL 0  . oxybutynin (DITROPAN-XL) 10 MG 24 hr tablet oxybutynin chloride ER 10 mg tablet,extended release 24 hr    . rosuvastatin (CRESTOR) 40 MG tablet Take 1 tablet (40 mg total) by mouth daily. 90 tablet 3  . tiZANidine (ZANAFLEX) 4 MG tablet Take 1 tablet (4 mg total) by mouth every  6 (six) hours as needed for muscle spasms. 30 tablet 1  . traMADol (ULTRAM) 50 MG tablet Take 1 tablet (50 mg total) by mouth every 6 (six) hours as needed. 30 tablet 0  . triamcinolone (NASACORT AQ) 55 MCG/ACT AERO nasal inhaler Place 2 sprays into the nose daily. 3 Inhaler 3  . triamcinolone cream (KENALOG) 0.1 % Apply 1 application topically 2 (two) times daily. 30 g 0   No current facility-administered medications on file prior to visit.    Observations/Objective: Alert, NAD, appropriate mood and affect, resps normal, cn 2-12 intact, moves all 4s, no visible rash or swelling Lab Results  Component  Value Date   WBC 4.0 07/13/2020   HGB 13.1 07/13/2020   HCT 40.4 07/13/2020   PLT 263 07/13/2020   GLUCOSE 117 (H) 07/13/2020   CHOL 275 (H) 08/06/2019   TRIG 189.0 (H) 08/06/2019   HDL 48.60 08/06/2019   LDLDIRECT 189.0 05/08/2018   LDLCALC 188 (H) 08/06/2019   ALT 20 09/07/2019   AST 16 09/07/2019   NA 139 07/13/2020   K 3.7 07/13/2020   CL 102 07/13/2020   CREATININE 0.77 07/13/2020   BUN 11 07/13/2020   CO2 27 07/13/2020   TSH 2.44 08/06/2019   INR 0.92 05/24/2011   HGBA1C 6.1 08/06/2019   MICROALBUR 0.9 10/26/2015   Assessment and Plan: COVID infection - sats normal yesterday pere pt report, denies sob, unvaccinated, I urged pt to call back monoclonal Ab clinic for this tx, and for tessalon perle prn  Hyperglycemia -  Lab Results  Component Value Date   HGBA1C 6.1 08/06/2019  stable, cont diet control  Anxiety - counseled to f/u with monoclonal Ab treatment center, reassured  Follow Up Instructions: I discussed the assessment and treatment plan with the patient. The patient was provided an opportunity to ask questions and all were answered. The patient agreed with the plan and demonstrated an understanding of the instructions.   The patient was advised to call back or seek an in-person evaluation if the symptoms worsen or if the condition fails to improve as anticipated.  Cathlean Cower, MD

## 2020-07-15 NOTE — Telephone Encounter (Signed)
Called to discuss with patient about COVID-19 symptoms and the use of one of the available treatments for those with mild to moderate Covid symptoms and at a high risk of hospitalization.  Pt appears to qualify for outpatient treatment due to co-morbid conditions and/or a member of an at-risk group in accordance with the FDA Emergency Use Authorization.    Symptom onset: 07/13/20 Vaccinated: No Booster? No Immunocompromised? No Qualifiers: HTN  Unable to reach pt - Reached pt.  Debra Atkinson

## 2020-07-15 NOTE — Assessment & Plan Note (Signed)
See notes

## 2020-07-15 NOTE — Telephone Encounter (Signed)
I connected by phone with Tenna Child O'Daniel on 07/15/2020 at 1:14 PM to discuss the potential use of a new treatment for mild to moderate COVID-19 viral infection in non-hospitalized patients.  This patient is a 75 y.o. female that meets the FDA criteria for Emergency Use Authorization of COVID monoclonal antibody sotrovimab.  Has a (+) direct SARS-CoV-2 viral test result  Has mild or moderate COVID-19   Is NOT hospitalized due to COVID-19  Is within 10 days of symptom onset  Has at least one of the high risk factor(s) for progression to severe COVID-19 and/or hospitalization as defined in EUA.  Specific high risk criteria : Older age (>/= 75 yo) and BMI > 25   I have spoken and communicated the following to the patient or parent/caregiver regarding COVID monoclonal antibody treatment:  1. FDA has authorized the emergency use for the treatment of mild to moderate COVID-19 in adults and pediatric patients with positive results of direct SARS-CoV-2 viral testing who are 44 years of age and older weighing at least 40 kg, and who are at high risk for progressing to severe COVID-19 and/or hospitalization.  2. The significant known and potential risks and benefits of COVID monoclonal antibody, and the extent to which such potential risks and benefits are unknown.  3. Information on available alternative treatments and the risks and benefits of those alternatives, including clinical trials.  4. Patients treated with COVID monoclonal antibody should continue to self-isolate and use infection control measures (e.g., wear mask, isolate, social distance, avoid sharing personal items, clean and disinfect "high touch" surfaces, and frequent handwashing) according to CDC guidelines.   5. The patient or parent/caregiver has the option to accept or refuse COVID monoclonal antibody treatment.  After reviewing this information with the patient, she requests to think about it. Sent additional information to  MyChart. Plans to discuss with her primary care provider this afternoon.   [name], NP 07/15/2020 1:14 PM

## 2020-07-15 NOTE — Telephone Encounter (Signed)
I connected by phone with Debra Atkinson on 07/15/2020 at 6:01 PM to discuss the potential use of a new treatment for mild to moderate COVID-19 viral infection in non-hospitalized patients.  This patient is a 75 y.o. female that meets the FDA criteria for Emergency Use Authorization of COVID monoclonal antibody sotrovimab.  Has a (+) direct SARS-CoV-2 viral test result  Has mild or moderate COVID-19   Is NOT hospitalized due to COVID-19  Is within 10 days of symptom onset  Has at least one of the high risk factor(s) for progression to severe COVID-19 and/or hospitalization as defined in EUA.  Specific high risk criteria : Older age (>/= 75 yo) and BMI > 25   I have spoken and communicated the following to the patient or parent/caregiver regarding COVID monoclonal antibody treatment:  1. FDA has authorized the emergency use for the treatment of mild to moderate COVID-19 in adults and pediatric patients with positive results of direct SARS-CoV-2 viral testing who are 75 years of age and older weighing at least 40 kg, and who are at high risk for progressing to severe COVID-19 and/or hospitalization.  2. The significant known and potential risks and benefits of COVID monoclonal antibody, and the extent to which such potential risks and benefits are unknown.  3. Information on available alternative treatments and the risks and benefits of those alternatives, including clinical trials.  4. Patients treated with COVID monoclonal antibody should continue to self-isolate and use infection control measures (e.g., wear mask, isolate, social distance, avoid sharing personal items, clean and disinfect "high touch" surfaces, and frequent handwashing) according to CDC guidelines.   5. The patient or parent/caregiver has the option to accept or refuse COVID monoclonal antibody treatment.  After reviewing this information with the patient, the patient has agreed to receive one of the available covid 19  monoclonal antibodies and will be provided an appropriate fact sheet prior to infusion.   [name], NP 07/15/2020 6:01 PM

## 2020-07-16 ENCOUNTER — Ambulatory Visit (HOSPITAL_COMMUNITY)
Admission: RE | Admit: 2020-07-16 | Discharge: 2020-07-16 | Disposition: A | Discharge status: Home / Self Care | Payer: Medicare HMO | Source: Ambulatory Visit | Attending: Pulmonary Disease | Admitting: Pulmonary Disease

## 2020-07-16 DIAGNOSIS — U071 COVID-19: Secondary | ICD-10-CM | POA: Insufficient documentation

## 2020-07-16 MED ORDER — SOTROVIMAB 500 MG/8ML IV SOLN
500.0000 mg | Freq: Once | INTRAVENOUS | Status: AC
  Administered 2020-07-16: 500 mg via INTRAVENOUS

## 2020-07-16 MED ORDER — SODIUM CHLORIDE 0.9 % IV SOLN: INTRAVENOUS | Status: DC | PRN

## 2020-07-16 MED ORDER — ALBUTEROL SULFATE HFA 108 (90 BASE) MCG/ACT IN AERS: 2.0000 | INHALATION_SPRAY | Freq: Once | RESPIRATORY_TRACT | Status: DC | PRN

## 2020-07-16 MED ORDER — METHYLPREDNISOLONE SODIUM SUCC 125 MG IJ SOLR: 125.0000 mg | Freq: Once | INTRAMUSCULAR | Status: DC | PRN

## 2020-07-16 MED ORDER — EPINEPHRINE 0.3 MG/0.3ML IJ SOAJ: 0.3000 mg | Freq: Once | INTRAMUSCULAR | Status: DC | PRN

## 2020-07-16 MED ORDER — FAMOTIDINE IN NACL 20-0.9 MG/50ML-% IV SOLN: 20.0000 mg | Freq: Once | INTRAVENOUS | Status: DC | PRN

## 2020-07-16 MED ORDER — DIPHENHYDRAMINE HCL 50 MG/ML IJ SOLN: 50.0000 mg | Freq: Once | INTRAMUSCULAR | Status: DC | PRN

## 2020-07-16 NOTE — Discharge Instructions (Signed)

## 2020-07-16 NOTE — Progress Notes (Signed)
Diagnosis: COVID-19  Physician: Dr. Patrick Wright  Procedure: [facility] Infusion Clinic Med: Sotrovimab infusion - Provided patient with sotrovimab fact sheet for patients, parents, and caregivers prior to infusion.   Complications: No immediate complications noted  Discharge: Discharged home    

## 2020-07-16 NOTE — Progress Notes (Signed)
Patient reviewed Fact Sheet for Patients, Parents, and Caregivers for Emergency Use Authorization (EUA) of sotrovimab for the Treatment of Coronavirus. Patient also reviewed and is agreeable to the estimated cost of treatment. Patient is agreeable to proceed.   

## 2020-07-19 ENCOUNTER — Telehealth (INDEPENDENT_AMBULATORY_CARE_PROVIDER_SITE_OTHER): Payer: Medicare HMO | Admitting: Internal Medicine

## 2020-07-19 ENCOUNTER — Encounter: Payer: Self-pay | Admitting: Internal Medicine

## 2020-07-19 ENCOUNTER — Other Ambulatory Visit: Payer: Self-pay

## 2020-07-19 DIAGNOSIS — U071 COVID-19: Secondary | ICD-10-CM

## 2020-07-19 DIAGNOSIS — R7303 Prediabetes: Secondary | ICD-10-CM | POA: Diagnosis not present

## 2020-07-19 DIAGNOSIS — J309 Allergic rhinitis, unspecified: Secondary | ICD-10-CM

## 2020-07-19 MED ORDER — HYDROCODONE-HOMATROPINE 5-1.5 MG/5ML PO SYRP: 5.0000 mL | ORAL_SOLUTION | Freq: Four times a day (QID) | ORAL | 0 refills | Status: DC | PRN

## 2020-07-19 MED ORDER — PROMETHAZINE-CODEINE 6.25-10 MG/5ML PO SYRP: 5.0000 mL | ORAL_SOLUTION | Freq: Four times a day (QID) | ORAL | 0 refills | Status: DC | PRN

## 2020-07-19 NOTE — Progress Notes (Signed)
Patient ID: Debra Atkinson, female   DOB: [birth year], 75 y.o.   MRN: [id number]  Virtual Visit via Video Note  I connected with Kelsei Defino O'Daniel on 07/19/20 at  8:00 AM EST by a video enabled telemedicine application and verified that I am speaking with the correct person using two identifiers.  Location of all participants today Patient: at home Provider: at home   I discussed the limitations of evaluation and management by telemedicine and the availability of in person appointments. The patient expressed understanding and agreed to proceed.  History of Present Illness: Here to f/u with persistent cough and congestion with known covid infection; symptoms started 7 days ago, and 5 days ago tested +, seen in ED jan 13, referred for monoclonal AB and completed infusion jan 15; overall feels improved after the first few days were the worst, but still cough and sinus congestion persistent somewhat to disrupt sleep, hoping for symptom control.  Already taking flonase, mucinex, zyrtec and Vit D3, and also preservision with zinc and vit C.  Pt denies chest pain, increased sob or doe, wheezing, orthopnea, PND, increased LE swelling, palpitations, dizziness or syncope.  Pt denies new neurological symptoms such as new headache, or facial or extremity weakness or numbness  Pt denies polydipsia, polyuria Past Medical History:  Diagnosis Date  . Cerebrovascular disease 02/05/2019  . Concussion '06, '11  . Diabetes mellitus    diet management  . Family history of colon cancer 02/28/2016  . Fibromyalgia 10/08/2013  . Hyperlipidemia    diet managed  . Varicella    Past Surgical History:  Procedure Laterality Date  . APPENDECTOMY  1979  . [place]  . CHOLECYSTECTOMY  1979   laparotomy    reports that she has never smoked. She has never used smokeless tobacco. She reports that she does not drink alcohol and does not use drugs. family history includes COPD in her mother; Cancer (age of onset:  55) in her mother; Diabetes in her maternal grandfather and mother; Heart disease in her father and mother; Rheumatic fever in her father. Allergies  Allergen Reactions  . Methylprednisolone Itching   Current Outpatient Medications on File Prior to Visit  Medication Sig Dispense Refill  . benzonatate (TESSALON PERLES) 100 MG capsule Take 1 capsule (100 mg total) by mouth 2 (two) times daily as needed for cough. 60 capsule 0  . cetirizine (ZYRTEC) 10 MG tablet Take 1 tablet (10 mg total) by mouth daily. 90 tablet 3  . Cholecalciferol (CVS D3) 50 MCG (2000 UT) CAPS Take by mouth.     . Cyanocobalamin (CVS VITAMIN B-12 PO) Take 2,500 tablets by mouth daily.     . diazepam (VALIUM) 5 MG tablet Take 1 tablet (5 mg total) by mouth every 12 (twelve) hours as needed for anxiety. 10 tablet 2  . guaiFENesin (MUCINEX) 600 MG 12 hr tablet Take 2 tablets (1,200 mg total) by mouth 2 (two) times daily as needed. 60 tablet 1  . ibuprofen (ADVIL,MOTRIN) 200 MG tablet Take 200 mg by mouth every 6 (six) hours as needed.    . meclizine (ANTIVERT) 12.5 MG tablet Take 1 tablet (12.5 mg total) by mouth 3 (three) times daily as needed for dizziness. 30 tablet 1  . Multiple Vitamins-Minerals (MULTIVITAMIN WITH MINERALS) tablet Take 1 tablet by mouth daily.    [name] nystatin (MYCOSTATIN) 100000 UNIT/ML suspension Take 5 mLs (500,000 Units total) by mouth in the morning, at noon, and at bedtime. Gargle, swish  and spit 60 mL 0  . oxybutynin (DITROPAN-XL) 10 MG 24 hr tablet oxybutynin chloride ER 10 mg tablet,extended release 24 hr    . rosuvastatin (CRESTOR) 40 MG tablet Take 1 tablet (40 mg total) by mouth daily. 90 tablet 3  . tiZANidine (ZANAFLEX) 4 MG tablet Take 1 tablet (4 mg total) by mouth every 6 (six) hours as needed for muscle spasms. 30 tablet 1  . traMADol (ULTRAM) 50 MG tablet Take 1 tablet (50 mg total) by mouth every 6 (six) hours as needed. 30 tablet 0  . triamcinolone (NASACORT AQ) 55 MCG/ACT AERO nasal  inhaler Place 2 sprays into the nose daily. 3 Inhaler 3  . triamcinolone cream (KENALOG) 0.1 % Apply 1 application topically 2 (two) times daily. 30 g 0   No current facility-administered medications on file prior to visit.    Observations/Objective: Alert, NAD, appropriate mood and affect, resps normal, cn 2-12 intact, moves all 4s, no visible rash or swelling Lab Results  Component Value Date   WBC 4.0 07/13/2020   HGB 13.1 07/13/2020   HCT 40.4 07/13/2020   PLT 263 07/13/2020   GLUCOSE 117 (H) 07/13/2020   CHOL 275 (H) 08/06/2019   TRIG 189.0 (H) 08/06/2019   HDL 48.60 08/06/2019   LDLDIRECT 189.0 05/08/2018   LDLCALC 188 (H) 08/06/2019   ALT 20 09/07/2019   AST 16 09/07/2019   NA 139 07/13/2020   K 3.7 07/13/2020   CL 102 07/13/2020   CREATININE 0.77 07/13/2020   BUN 11 07/13/2020   CO2 27 07/13/2020   TSH 2.44 08/06/2019   INR 0.92 05/24/2011   HGBA1C 6.1 08/06/2019   MICROALBUR 0.9 10/26/2015   Assessment and Plan: See notes  Follow Up Instructions: See notes   I discussed the assessment and treatment plan with the patient. The patient was provided an opportunity to ask questions and all were answered. The patient agreed with the plan and demonstrated an understanding of the instructions.   The patient was advised to call back or seek an in-person evaluation if the symptoms worsen or if the condition fails to improve as anticipated.  Cathlean Cower, MD

## 2020-07-19 NOTE — Assessment & Plan Note (Addendum)
Overall improved, ok for phenergan/codeine prn cough, and pt advised to continue to follow for any worsening s/s such as weakness and dyspnea.  May want to borrow a oximeter to monitor over the next few days, and go to ED for worsening oxygenation, declines decadron course for now

## 2020-07-19 NOTE — Patient Instructions (Signed)
Please take all new medication as prescribed 

## 2020-07-19 NOTE — Assessment & Plan Note (Signed)
Lab Results  Component Value Date   HGBA1C 6.1 08/06/2019   Stable, pt to continue current medical treatment  - diet

## 2020-07-19 NOTE — Assessment & Plan Note (Signed)
Stable on flonase and zyrtec, cont current med tx

## 2020-07-19 NOTE — Addendum Note (Signed)
Addended by: Biagio Borg on: 07/19/2020 10:23 AM   Modules accepted: Orders

## 2020-07-22 ENCOUNTER — Other Ambulatory Visit: Payer: Self-pay | Admitting: Internal Medicine

## 2020-08-01 DIAGNOSIS — H2513 Age-related nuclear cataract, bilateral: Secondary | ICD-10-CM | POA: Diagnosis not present

## 2020-08-01 DIAGNOSIS — H353132 Nonexudative age-related macular degeneration, bilateral, intermediate dry stage: Secondary | ICD-10-CM | POA: Diagnosis not present

## 2020-08-02 ENCOUNTER — Other Ambulatory Visit (INDEPENDENT_AMBULATORY_CARE_PROVIDER_SITE_OTHER): Payer: Medicare HMO

## 2020-08-02 ENCOUNTER — Other Ambulatory Visit: Payer: Self-pay

## 2020-08-02 DIAGNOSIS — E785 Hyperlipidemia, unspecified: Secondary | ICD-10-CM | POA: Diagnosis not present

## 2020-08-02 DIAGNOSIS — E538 Deficiency of other specified B group vitamins: Secondary | ICD-10-CM | POA: Diagnosis not present

## 2020-08-02 DIAGNOSIS — R7303 Prediabetes: Secondary | ICD-10-CM | POA: Diagnosis not present

## 2020-08-02 DIAGNOSIS — E559 Vitamin D deficiency, unspecified: Secondary | ICD-10-CM | POA: Diagnosis not present

## 2020-08-02 LAB — LDL CHOLESTEROL, DIRECT: Direct LDL: 152 mg/dL

## 2020-08-02 LAB — CBC WITH DIFFERENTIAL/PLATELET
Basophils Absolute: 0 10*3/uL (ref 0.0–0.1)
Basophils Relative: 0.4 % (ref 0.0–3.0)
Eosinophils Absolute: 0.1 10*3/uL (ref 0.0–0.7)
Eosinophils Relative: 1.3 % (ref 0.0–5.0)
HCT: 38.9 % (ref 36.0–46.0)
Hemoglobin: 12.8 g/dL (ref 12.0–15.0)
Lymphocytes Relative: 36.5 % (ref 12.0–46.0)
Lymphs Abs: 2.4 10*3/uL (ref 0.7–4.0)
MCHC: 33 g/dL (ref 30.0–36.0)
MCV: 83.4 fl (ref 78.0–100.0)
Monocytes Absolute: 0.6 10*3/uL (ref 0.1–1.0)
Monocytes Relative: 9.1 % (ref 3.0–12.0)
Neutro Abs: 3.4 10*3/uL (ref 1.4–7.7)
Neutrophils Relative %: 52.7 % (ref 43.0–77.0)
Platelets: 353 10*3/uL (ref 150.0–400.0)
RBC: 4.67 Mil/uL (ref 3.87–5.11)
RDW: 13.8 % (ref 11.5–15.5)
WBC: 6.5 10*3/uL (ref 4.0–10.5)

## 2020-08-02 LAB — URINALYSIS, ROUTINE W REFLEX MICROSCOPIC
Bilirubin Urine: NEGATIVE
Hgb urine dipstick: NEGATIVE
Ketones, ur: NEGATIVE
Leukocytes,Ua: NEGATIVE
Nitrite: NEGATIVE
Specific Gravity, Urine: 1.01 (ref 1.000–1.030)
Total Protein, Urine: NEGATIVE
Urine Glucose: NEGATIVE
Urobilinogen, UA: 0.2 (ref 0.0–1.0)
pH: 6.5 (ref 5.0–8.0)

## 2020-08-02 LAB — COMPREHENSIVE METABOLIC PANEL
ALT: 18 U/L (ref 0–35)
AST: 14 U/L (ref 0–37)
Albumin: 4 g/dL (ref 3.5–5.2)
Alkaline Phosphatase: 77 U/L (ref 39–117)
BUN: 14 mg/dL (ref 6–23)
CO2: 30 mEq/L (ref 19–32)
Calcium: 9.9 mg/dL (ref 8.4–10.5)
Chloride: 105 mEq/L (ref 96–112)
Creatinine, Ser: 0.7 mg/dL (ref 0.40–1.20)
GFR: 85.19 mL/min (ref >60.00)
Glucose, Bld: 100 mg/dL — ABNORMAL HIGH (ref 70–99)
Potassium: 4.3 mEq/L (ref 3.5–5.1)
Sodium: 141 mEq/L (ref 135–145)
Total Bilirubin: 0.6 mg/dL (ref 0.2–1.2)
Total Protein: 7.1 g/dL (ref 6.0–8.3)

## 2020-08-02 LAB — HEMOGLOBIN A1C: Hgb A1c MFr Bld: 6.4 % (ref 4.6–6.5)

## 2020-08-02 LAB — LIPID PANEL
Cholesterol: 241 mg/dL — ABNORMAL HIGH (ref 0–200)
HDL: 39.4 mg/dL (ref >39.00)
NonHDL: 201.16
Total CHOL/HDL Ratio: 6
Triglycerides: 223 mg/dL — ABNORMAL HIGH (ref 0.0–149.0)
VLDL: 44.6 mg/dL — ABNORMAL HIGH (ref 0.0–40.0)

## 2020-08-02 LAB — VITAMIN D 25 HYDROXY (VIT D DEFICIENCY, FRACTURES): VITD: 33.64 ng/mL (ref 30.00–100.00)

## 2020-08-02 LAB — TSH: TSH: 2.6 u[IU]/mL (ref 0.35–4.50)

## 2020-08-02 LAB — VITAMIN B12: Vitamin B-12: >1526 pg/mL — ABNORMAL HIGH (ref 211–911)

## 2020-08-02 NOTE — Addendum Note (Signed)
Addended by: KING, Frandy Basnett M on: 08/02/2020 09:28 AM   Modules accepted: Orders  

## 2020-08-02 NOTE — Addendum Note (Signed)
Addended by: KING, Nasire Reali M on: 08/02/2020 09:28 AM   Modules accepted: Orders  

## 2020-08-02 NOTE — Addendum Note (Signed)
Addended by: KING, Sahmir Weatherbee M on: 08/02/2020 09:28 AM   Modules accepted: Orders  

## 2020-08-02 NOTE — Addendum Note (Signed)
Addended by: Raliegh Ip on: 08/02/2020 09:28 AM   Modules accepted: Orders

## 2020-08-04 ENCOUNTER — Other Ambulatory Visit: Payer: Self-pay

## 2020-08-05 ENCOUNTER — Telehealth: Payer: Self-pay | Admitting: Internal Medicine

## 2020-08-05 ENCOUNTER — Ambulatory Visit (INDEPENDENT_AMBULATORY_CARE_PROVIDER_SITE_OTHER): Payer: Medicare HMO | Admitting: Internal Medicine

## 2020-08-05 ENCOUNTER — Encounter: Payer: Self-pay | Admitting: Internal Medicine

## 2020-08-05 VITALS — BP 138/76 | HR 98 | Ht 62.0 in | Wt 183.0 lb

## 2020-08-05 DIAGNOSIS — U071 COVID-19: Secondary | ICD-10-CM | POA: Diagnosis not present

## 2020-08-05 DIAGNOSIS — E538 Deficiency of other specified B group vitamins: Secondary | ICD-10-CM | POA: Diagnosis not present

## 2020-08-05 DIAGNOSIS — F419 Anxiety disorder, unspecified: Secondary | ICD-10-CM | POA: Diagnosis not present

## 2020-08-05 DIAGNOSIS — Z23 Encounter for immunization: Secondary | ICD-10-CM

## 2020-08-05 DIAGNOSIS — L0591 Pilonidal cyst without abscess: Secondary | ICD-10-CM

## 2020-08-05 DIAGNOSIS — R5383 Other fatigue: Secondary | ICD-10-CM | POA: Diagnosis not present

## 2020-08-05 DIAGNOSIS — E7800 Pure hypercholesterolemia, unspecified: Secondary | ICD-10-CM

## 2020-08-05 DIAGNOSIS — E559 Vitamin D deficiency, unspecified: Secondary | ICD-10-CM

## 2020-08-05 DIAGNOSIS — Z0000 Encounter for general adult medical examination without abnormal findings: Secondary | ICD-10-CM

## 2020-08-05 DIAGNOSIS — Z0001 Encounter for general adult medical examination with abnormal findings: Secondary | ICD-10-CM

## 2020-08-05 DIAGNOSIS — R7303 Prediabetes: Secondary | ICD-10-CM | POA: Diagnosis not present

## 2020-08-05 DIAGNOSIS — H353190 Nonexudative age-related macular degeneration, unspecified eye, stage unspecified: Secondary | ICD-10-CM | POA: Insufficient documentation

## 2020-08-05 NOTE — Telephone Encounter (Signed)
Patient called and was wondering if she needed to take 20 mg or 40 mg of rosuvastatin (CRESTOR) 40 MG tablet. Please advise. Please call the patient [phone].

## 2020-08-05 NOTE — Patient Instructions (Signed)
You had the flu shot today  Ok to start the crestor as prescribed  Ok to increase the vit d3 to at least 2000 units per day  Ok to take only 1 B12 supplement per day  Please call if you would want a referral to general surgury for the pilonidal cyst  Please continue all other medications as before, and refills have been done if requested.  Please have the pharmacy call with any other refills you may need.  Please continue your efforts at being more active, low cholesterol diet, and weight control.  You are otherwise up to date with prevention measures today.  Please keep your appointments with your specialists as you may have planned  Please make an Appointment to return in 6 months, or sooner if needed, also with Lab Appointment for testing done 3-5 days before at the [facility] (so this is for TWO appointments - please see the scheduling desk as you leave)  Due to the ongoing Covid 19 pandemic, our lab now requires an appointment for any labs done at our office.  If you need labs done and do not have an appointment, please call our office ahead of time to schedule before presenting to the lab for your testing.

## 2020-08-05 NOTE — Telephone Encounter (Signed)
Patient had some questions that she would like to discuss. The first question was regarding the infusion for Covid, she had read that the infusion can lower your immunity and she wanted to discuss the risks of that and how that might affect her health. She also is still having drainage and is wanting to know if she needs to resume taking the cetirizine (ZYRTEC) 10 MG tablet, guaiFENesin (MUCINEX) 600 MG 12 hr tablet, and Flonase.   Please advise and call her back at [phone]

## 2020-08-05 NOTE — Progress Notes (Signed)
Established Patient Office Visit  Subjective:  Patient ID: Debra Atkinson, female    DOB: 1946/06/08  Age: 75 y.o. MRN: [id number]  S/p covid infection mid jan 2022  Has new macular degeneration bilat, and cataracts, now taking preservation.        Chief Complaint:: wellness exam and Follow-up  recent covid infection s/p monoclonal ab now convinced it is harming her immune system, hld, low vit d, low vit b12, and recurring pilonidal cyst       HPI:  Debra Atkinson is a 75 y.o. female here for wellness exam, due for flu shot, o/w up to date   Wt Readings from Last 3 Encounters:  08/05/20 183 lb (83 kg)  02/03/20 196 lb (88.9 kg)  11/27/19 193 lb (87.5 kg)   BP Readings from Last 3 Encounters:  08/05/20 138/76  07/16/20 132/72  07/14/20 117/76   Immunization History  Administered Date(s) Administered  . Fluad Quad(high Dose 65+) 05/07/2019  . Influenza Split 05/04/2011, 04/28/2012  . Influenza, High Dose Seasonal PF 03/28/2017, 05/08/2018  . Influenza,inj,Quad PF,6+ Mos 05/18/2013, 04/09/2014, 04/15/2015, 04/26/2016  . Influenza-Unspecified 04/04/2015  . Pneumococcal Conjugate-13 10/08/2014  . Pneumococcal Polysaccharide-23 05/04/2011  . Tdap 04/28/2012   Health Maintenance Due  Topic Date Due  . URINE MICROALBUMIN  10/25/2016  . INFLUENZA VACCINE  01/31/2020        Also had long long discourse about her recent covid infection with lots of concern now about maybe being harmed by the monoclonal ab infusion that may have decreased the immune system since it says that on the information sheet but has fortunately only fatigue post infection and  Pt denies fever, wt loss, night sweats, loss of appetite, or other constitutional symptoms  Has not yet started her crestor but willing to do since she he is here today it seems.  Taking a lower 1000 u vit d, but daily b12 and tolerating well.  Also c/o a recurring what sounds like pilonidal cyst to the natal cleft area with  intermittent drainage, none now but seems to happen every few months, and has several phone pictures someone took of the area standing while she hold her buttocks aside but area is dark and hard to see in photos.  Has no current problem now, nothing seems to make better or worse but ongoing for may be a year.   Past Medical History:  Diagnosis Date  . Cerebrovascular disease 02/05/2019  . Concussion '06, '11  . Diabetes mellitus    diet management  . Family history of colon cancer 02/28/2016  . Fibromyalgia 10/08/2013  . Hyperlipidemia    diet managed  . Varicella    Past Surgical History:  Procedure Laterality Date  . APPENDECTOMY  1979  . [place]  . CHOLECYSTECTOMY  1979   laparotomy    reports that she has never smoked. She has never used smokeless tobacco. She reports that she does not drink alcohol and does not use drugs. family history includes COPD in her mother; Cancer (age of onset: 16) in her mother; Diabetes in her maternal grandfather and mother; Heart disease in her father and mother; Rheumatic fever in her father. Allergies  Allergen Reactions  . Methylprednisolone Itching   Current Outpatient Medications on File Prior to Visit  Medication Sig Dispense Refill  . benzonatate (TESSALON PERLES) 100 MG capsule Take 1 capsule (100 mg total) by mouth 2 (two) times daily as needed for cough. 60 capsule 0  .  cetirizine (ZYRTEC) 10 MG tablet Take 1 tablet (10 mg total) by mouth daily. 90 tablet 3  . Cholecalciferol (CVS D3) 50 MCG (2000 UT) CAPS Take by mouth.     . Cyanocobalamin (CVS VITAMIN B-12 PO) Take 2,500 tablets by mouth daily.     . diazepam (VALIUM) 5 MG tablet Take 1 tablet (5 mg total) by mouth every 12 (twelve) hours as needed for anxiety. 10 tablet 2  . guaiFENesin (MUCINEX) 600 MG 12 hr tablet Take 2 tablets (1,200 mg total) by mouth 2 (two) times daily as needed. 60 tablet 1  . ibuprofen (ADVIL,MOTRIN) 200 MG tablet Take 200 mg by mouth every 6 (six)  hours as needed.    . meclizine (ANTIVERT) 12.5 MG tablet Take 1 tablet (12.5 mg total) by mouth 3 (three) times daily as needed for dizziness. 30 tablet 1  . Multiple Vitamins-Minerals (MULTIVITAMIN WITH MINERALS) tablet Take 1 tablet by mouth daily.    [name] nystatin (MYCOSTATIN) 100000 UNIT/ML suspension Take 5 mLs (500,000 Units total) by mouth in the morning, at noon, and at bedtime. Gargle, swish and spit 60 mL 0  . oxybutynin (DITROPAN-XL) 10 MG 24 hr tablet oxybutynin chloride ER 10 mg tablet,extended release 24 hr    . promethazine-codeine (PHENERGAN WITH CODEINE) 6.25-10 MG/5ML syrup Take 5 mLs by mouth every 6 (six) hours as needed for cough. 180 mL 0  . rosuvastatin (CRESTOR) 40 MG tablet Take 1 tablet (40 mg total) by mouth daily. 90 tablet 3  . tiZANidine (ZANAFLEX) 4 MG tablet Take 1 tablet (4 mg total) by mouth every 6 (six) hours as needed for muscle spasms. 30 tablet 1  . traMADol (ULTRAM) 50 MG tablet Take 1 tablet (50 mg total) by mouth every 6 (six) hours as needed. 30 tablet 0  . triamcinolone (NASACORT AQ) 55 MCG/ACT AERO nasal inhaler Place 2 sprays into the nose daily. 3 Inhaler 3  . triamcinolone cream (KENALOG) 0.1 % Apply 1 application topically 2 (two) times daily. 30 g 0   No current facility-administered medications on file prior to visit.        ROS:  All others reviewed and negative.  Objective        PE:  BP 138/76   Pulse 98   Ht 5\' 2"  (1.575 m)   Wt 183 lb (83 kg)   SpO2 96%   BMI 33.47 kg/m                 Constitutional: Pt appears in NAD               HENT: Head: NCAT.                Right Ear: External ear normal.                 Left Ear: External ear normal.                Eyes: . Pupils are equal, round, and reactive to light. Conjunctivae and EOM are normal               Nose: without d/c or deformity               Neck: Neck supple. Gross normal ROM               Cardiovascular: Normal rate and regular rhythm.                 Pulmonary/Chest:  Effort normal  and breath sounds without rales or wheezing.                Abd:  Soft, NT, ND, + BS, no organomegaly               Neurological: Pt is alert. At baseline orientation, motor grossly intact               Skin: Skin is warm. No rashes, no other new lesions, LE edema - trace bilat               Psychiatric: Pt behavior is normal without agitation , nervous  Assessment/Plan:  Markeesha Char Atkinson is a 75 y.o. White or Caucasian [1] female with  has a past medical history of Cerebrovascular disease (02/05/2019), Concussion ('06, '11), Diabetes mellitus, Family history of colon cancer (02/28/2016), Fibromyalgia (10/08/2013), Hyperlipidemia, and Varicella.  Micro: none  Cardiac tracings I have personally interpreted today:  none  Pertinent Radiological findings (summarize): none   Lab Results  Component Value Date   WBC 6.5 08/02/2020   HGB 12.8 08/02/2020   HCT 38.9 08/02/2020   PLT 353.0 08/02/2020   GLUCOSE 100 (H) 08/02/2020   CHOL 241 (H) 08/02/2020   TRIG 223.0 (H) 08/02/2020   HDL 39.40 08/02/2020   LDLDIRECT 152.0 08/02/2020   LDLCALC 188 (H) 08/06/2019   ALT 18 08/02/2020   AST 14 08/02/2020   NA 141 08/02/2020   K 4.3 08/02/2020   CL 105 08/02/2020   CREATININE 0.70 08/02/2020   BUN 14 08/02/2020   CO2 30 08/02/2020   TSH 2.60 08/02/2020   INR 0.92 05/24/2011   HGBA1C 6.4 08/02/2020   MICROALBUR 0.9 10/26/2015     Assessment & Plan:   Problem List Items Addressed This Visit      High   Encounter for well adult exam with abnormal findings - Primary    Age and sex appropriate education and counseling updated with regular exercise and diet Referrals for preventative services - none needed Immunizations addressed - for flu shot today Smoking counseling  - none needed Evidence for depression or other mood disorder - none significant Most recent labs reviewed. I have personally reviewed and have noted: 1) the patient's medical and social history 2) The  patient's current medications and supplements 3) The patient's height, weight, and BMI have been recorded in the chart         Medium   Vitamin D deficiency    Last vitamin D Lab Results  Component Value Date   VD25OH 33.64 08/02/2020   Stable but low normal, cont oral replacement with increased to 2000 u qd       Pre-diabetes    Lab Results  Component Value Date   HGBA1C 6.4 08/02/2020   Stable, pt to continue current medical treatment  - diet       Hyperlipidemia    Lab Results  Component Value Date   LDLCALC 188 (H) 08/06/2019   Severe uncontrolled, pt to start statin crestor 40   Current Outpatient Medications (Cardiovascular):  .  rosuvastatin (CRESTOR) 40 MG tablet, Take 1 tablet (40 mg total) by mouth daily.  Current Outpatient Medications (Respiratory):  .  benzonatate (TESSALON PERLES) 100 MG capsule, Take 1 capsule (100 mg total) by mouth 2 (two) times daily as needed for cough. .  cetirizine (ZYRTEC) 10 MG tablet, Take 1 tablet (10 mg total) by mouth daily. [name]  guaiFENesin (MUCINEX) 600 MG 12 hr tablet, Take 2 tablets (  1,200 mg total) by mouth 2 (two) times daily as needed. .  promethazine-codeine (PHENERGAN WITH CODEINE) 6.25-10 MG/5ML syrup, Take 5 mLs by mouth every 6 (six) hours as needed for cough. .  triamcinolone (NASACORT AQ) 55 MCG/ACT AERO nasal inhaler, Place 2 sprays into the nose daily.  Current Outpatient Medications (Analgesics):  .  ibuprofen (ADVIL,MOTRIN) 200 MG tablet, Take 200 mg by mouth every 6 (six) hours as needed. .  traMADol (ULTRAM) 50 MG tablet, Take 1 tablet (50 mg total) by mouth every 6 (six) hours as needed.  Current Outpatient Medications (Hematological):  [name]  Cyanocobalamin (CVS VITAMIN B-12 PO), Take 2,500 tablets by mouth daily.   Current Outpatient Medications (Other):  [name]  Cholecalciferol (CVS D3) 50 MCG (2000 UT) CAPS, Take by mouth.  .  diazepam (VALIUM) 5 MG tablet, Take 1 tablet (5 mg total) by mouth every 12  (twelve) hours as needed for anxiety. .  meclizine (ANTIVERT) 12.5 MG tablet, Take 1 tablet (12.5 mg total) by mouth 3 (three) times daily as needed for dizziness. .  Multiple Vitamins-Minerals (MULTIVITAMIN WITH MINERALS) tablet, Take 1 tablet by mouth daily. [name]  nystatin (MYCOSTATIN) 100000 UNIT/ML suspension, Take 5 mLs (500,000 Units total) by mouth in the morning, at noon, and at bedtime. Gargle, swish and spit .  oxybutynin (DITROPAN-XL) 10 MG 24 hr tablet, oxybutynin chloride ER 10 mg tablet,extended release 24 hr .  tiZANidine (ZANAFLEX) 4 MG tablet, Take 1 tablet (4 mg total) by mouth every 6 (six) hours as needed for muscle spasms. [name]  triamcinolone cream (KENALOG) 0.1 %, Apply 1 application topically 2 (two) times daily.       Fatigue    Post covid, possilble long covid symptom, but was present to some degree prior to infection as well, encouraged good nutrition and regular exercise and avoid stressors as possible      COVID-19 virus infection    Long d/w pt regarding her recent infection s/p monoclonal ab with post covid fatigue but o/w exam is benign, and it seems unlikely she has been harmed by the ab tx.      B12 deficiency    Lab Results  Component Value Date   VITAMINB12 >1526 (H) 08/02/2020   High overcontrolled, cont oral replacement at qod only - b12 1000 mcg qd       Anxiety    Chronic persistent, o/w stable, to cont current tx - valium  Bid prn (though really doesn't take much)         Low   Pilonidal cyst    Ok to follow for now, return when symptomatic for exam, and consider possible referral to gen surgury       Other Visit Diagnoses    Needs flu shot       Relevant Orders   Flu Vaccine QUAD High Dose(Fluad)      No orders of the defined types were placed in this encounter.   Follow-up: Return in about 6 months (around 02/02/2021).   Cathlean Cower, MD 08/06/2020 4:53 PM [facility]

## 2020-08-06 ENCOUNTER — Encounter: Payer: Self-pay | Admitting: Internal Medicine

## 2020-08-06 DIAGNOSIS — L0591 Pilonidal cyst without abscess: Secondary | ICD-10-CM | POA: Insufficient documentation

## 2020-08-06 NOTE — Telephone Encounter (Signed)
It should be on the AVS, but it is the 40 mg, thanks

## 2020-08-06 NOTE — Assessment & Plan Note (Signed)
Lab Results  Component Value Date   VITAMINB12 >1526 (H) 08/02/2020   High overcontrolled, cont oral replacement at qod only - b12 1000 mcg qd

## 2020-08-06 NOTE — Assessment & Plan Note (Signed)
Post covid, possilble long covid symptom, but was present to some degree prior to infection as well, encouraged good nutrition and regular exercise and avoid stressors as possible

## 2020-08-06 NOTE — Assessment & Plan Note (Signed)
Age and sex appropriate education and counseling updated with regular exercise and diet Referrals for preventative services - none needed Immunizations addressed - for flu shot today Smoking counseling  - none needed Evidence for depression or other mood disorder - none significant Most recent labs reviewed. I have personally reviewed and have noted: 1) the patient's medical and social history 2) The patient's current medications and supplements 3) The patient's height, weight, and BMI have been recorded in the chart  

## 2020-08-06 NOTE — Assessment & Plan Note (Signed)
Long d/w pt regarding her recent infection s/p monoclonal ab with post covid fatigue but o/w exam is benign, and it seems unlikely she has been harmed by the ab tx.

## 2020-08-06 NOTE — Assessment & Plan Note (Signed)
Lab Results  Component Value Date   HGBA1C 6.4 08/02/2020   Stable, pt to continue current medical treatment  - diet

## 2020-08-06 NOTE — Assessment & Plan Note (Addendum)
Last vitamin D Lab Results  Component Value Date   VD25OH 33.64 08/02/2020   Stable but low normal, cont oral replacement with increased to 2000 u qd

## 2020-08-06 NOTE — Assessment & Plan Note (Signed)
Lab Results  Component Value Date   LDLCALC 188 (H) 08/06/2019   Severe uncontrolled, pt to start statin crestor 40   Current Outpatient Medications (Cardiovascular):  .  rosuvastatin (CRESTOR) 40 MG tablet, Take 1 tablet (40 mg total) by mouth daily.  Current Outpatient Medications (Respiratory):  .  benzonatate (TESSALON PERLES) 100 MG capsule, Take 1 capsule (100 mg total) by mouth 2 (two) times daily as needed for cough. .  cetirizine (ZYRTEC) 10 MG tablet, Take 1 tablet (10 mg total) by mouth daily. [name]  guaiFENesin (MUCINEX) 600 MG 12 hr tablet, Take 2 tablets (1,200 mg total) by mouth 2 (two) times daily as needed. .  promethazine-codeine (PHENERGAN WITH CODEINE) 6.25-10 MG/5ML syrup, Take 5 mLs by mouth every 6 (six) hours as needed for cough. .  triamcinolone (NASACORT AQ) 55 MCG/ACT AERO nasal inhaler, Place 2 sprays into the nose daily.  Current Outpatient Medications (Analgesics):  .  ibuprofen (ADVIL,MOTRIN) 200 MG tablet, Take 200 mg by mouth every 6 (six) hours as needed. .  traMADol (ULTRAM) 50 MG tablet, Take 1 tablet (50 mg total) by mouth every 6 (six) hours as needed.  Current Outpatient Medications (Hematological):  [name]  Cyanocobalamin ([employer] VITAMIN B-12 PO), Take 2,500 tablets by mouth daily.   Current Outpatient Medications (Other):  [name]  Cholecalciferol ([employer] D3) 50 MCG (2000 UT) CAPS, Take by mouth.  .  diazepam (VALIUM) 5 MG tablet, Take 1 tablet (5 mg total) by mouth every 12 (twelve) hours as needed for anxiety. .  meclizine (ANTIVERT) 12.5 MG tablet, Take 1 tablet (12.5 mg total) by mouth 3 (three) times daily as needed for dizziness. .  Multiple Vitamins-Minerals (MULTIVITAMIN WITH MINERALS) tablet, Take 1 tablet by mouth daily. [name]  nystatin (MYCOSTATIN) 100000 UNIT/ML suspension, Take 5 mLs (500,000 Units total) by mouth in the morning, at noon, and at bedtime. Gargle, swish and spit .  oxybutynin (DITROPAN-XL) 10 MG 24 hr tablet, oxybutynin chloride ER 10 mg  tablet,extended release 24 hr .  tiZANidine (ZANAFLEX) 4 MG tablet, Take 1 tablet (4 mg total) by mouth every 6 (six) hours as needed for muscle spasms. [name]  triamcinolone cream (KENALOG) 0.1 %, Apply 1 application topically 2 (two) times daily.

## 2020-08-06 NOTE — Assessment & Plan Note (Signed)
Ok to follow for now, return when symptomatic for exam, and consider possible referral to gen surgury

## 2020-08-06 NOTE — Telephone Encounter (Signed)
I already answered her question at the visit, though briefly.  There is no significant concern about lowering the immune system, despite what she reads in the fine print on the drug information sheet.  The whole point of the infusion was to build her immune system up.   OK to resume her medications as she mentioned for possible allergies,  These are OTC meds that are often taken for her mild allergy symptoms, and should be fine for her as well.

## 2020-08-06 NOTE — Assessment & Plan Note (Signed)
Chronic persistent, o/w stable, to cont current tx - valium  Bid prn (though really doesn't take much)

## 2020-08-08 NOTE — Telephone Encounter (Signed)
Patient has been notified

## 2020-08-08 NOTE — Telephone Encounter (Signed)
Pt.notified

## 2020-10-31 ENCOUNTER — Other Ambulatory Visit: Payer: Self-pay

## 2020-11-01 ENCOUNTER — Ambulatory Visit (INDEPENDENT_AMBULATORY_CARE_PROVIDER_SITE_OTHER): Payer: Medicare HMO | Admitting: Internal Medicine

## 2020-11-01 ENCOUNTER — Encounter: Payer: Self-pay | Admitting: Internal Medicine

## 2020-11-01 ENCOUNTER — Ambulatory Visit (INDEPENDENT_AMBULATORY_CARE_PROVIDER_SITE_OTHER): Payer: Medicare HMO

## 2020-11-01 VITALS — BP 130/76 | HR 75 | Temp 99.5°F | Ht 62.0 in | Wt 198.0 lb

## 2020-11-01 DIAGNOSIS — R5383 Other fatigue: Secondary | ICD-10-CM

## 2020-11-01 DIAGNOSIS — R000 Tachycardia, unspecified: Secondary | ICD-10-CM | POA: Diagnosis not present

## 2020-11-01 DIAGNOSIS — R7303 Prediabetes: Secondary | ICD-10-CM | POA: Diagnosis not present

## 2020-11-01 DIAGNOSIS — E7800 Pure hypercholesterolemia, unspecified: Secondary | ICD-10-CM

## 2020-11-01 DIAGNOSIS — R079 Chest pain, unspecified: Secondary | ICD-10-CM | POA: Diagnosis not present

## 2020-11-01 DIAGNOSIS — R32 Unspecified urinary incontinence: Secondary | ICD-10-CM | POA: Diagnosis not present

## 2020-11-01 DIAGNOSIS — R0600 Dyspnea, unspecified: Secondary | ICD-10-CM

## 2020-11-01 DIAGNOSIS — J069 Acute upper respiratory infection, unspecified: Secondary | ICD-10-CM

## 2020-11-01 LAB — HEPATIC FUNCTION PANEL
ALT: 18 U/L (ref 0–35)
AST: 17 U/L (ref 0–37)
Albumin: 4.2 g/dL (ref 3.5–5.2)
Alkaline Phosphatase: 80 U/L (ref 39–117)
Bilirubin, Direct: 0 mg/dL (ref 0.0–0.3)
Total Bilirubin: 0.4 mg/dL (ref 0.2–1.2)
Total Protein: 7.3 g/dL (ref 6.0–8.3)

## 2020-11-01 LAB — BASIC METABOLIC PANEL
BUN: 18 mg/dL (ref 6–23)
CO2: 30 mEq/L (ref 19–32)
Calcium: 10.1 mg/dL (ref 8.4–10.5)
Chloride: 102 mEq/L (ref 96–112)
Creatinine, Ser: 0.76 mg/dL (ref 0.40–1.20)
GFR: 77.05 mL/min (ref >60.00)
Glucose, Bld: 100 mg/dL — ABNORMAL HIGH (ref 70–99)
Potassium: 3.9 mEq/L (ref 3.5–5.1)
Sodium: 138 mEq/L (ref 135–145)

## 2020-11-01 LAB — HEMOGLOBIN A1C: Hgb A1c MFr Bld: 6.2 % (ref 4.6–6.5)

## 2020-11-01 LAB — LIPID PANEL
Cholesterol: 156 mg/dL (ref 0–200)
HDL: 50.9 mg/dL (ref >39.00)
LDL Cholesterol: 67 mg/dL (ref 0–99)
NonHDL: 105.54
Total CHOL/HDL Ratio: 3
Triglycerides: 193 mg/dL — ABNORMAL HIGH (ref 0.0–149.0)
VLDL: 38.6 mg/dL (ref 0.0–40.0)

## 2020-11-01 MED ORDER — DOXYCYCLINE HYCLATE 100 MG PO TABS: 100.0000 mg | ORAL_TABLET | Freq: Two times a day (BID) | ORAL | 0 refills | Status: DC

## 2020-11-01 NOTE — Patient Instructions (Signed)
Please take all new medication as prescribed - the antibiotic  Please continue all other medications as before, and refills have been done if requested.  Please have the pharmacy call with any other refills you may need.  Please keep your appointments with your specialists as you may have planned  Please go to the XRAY Department in the first floor for the x-ray testing  Please go to the LAB at the blood drawing area for the tests to be done  You will be contacted by phone if any changes need to be made immediately.  Otherwise, you will receive a letter about your results with an explanation, but please check with MyChart first.  Please remember to sign up for MyChart if you have not done so, as this will be important to you in the future with finding out test results, communicating by private email, and scheduling acute appointments online when needed.    [name]

## 2020-11-01 NOTE — Progress Notes (Signed)
Patient ID: Debra Atkinson, female   DOB: 02-Feb-1946, 75 y.o.   MRN: [id number]        Chief Complaint: dyspnea, uri, fatigue, hld, urine incontinene, hyperglycemia       HPI:  Debra Atkinson is a 75 y.o. female here after gained 20 lbs with covid period; with c/o sob and doe with usual activities but can still do all ADLs easily, Has more difficulty time to walkin from parking lot.  Pt denies chest pain, wheezing, orthopnea, PND, increased LE swelling, palpitations, dizziness or syncope.   Pt denies polydipsia, polyuria, or focal neuro s/s.   Pt denies fever, wt loss, night sweats, loss of appetite, or other constitutional symptoms  No other new complaints except for mild acute worsening urinary incontinence, but Denies urinary symptoms such as dysuria, frequency, urgency, flank pain, hematuria or n/v, fever, chills. Tolerting statin well but only taking 20 mg.  Also  Here with 2-3 days acute onset fever, facial pain, pressure, headache, general weakness and malaise, and greenish d/c, with mild ST and cough,       Wt Readings from Last 3 Encounters:  11/01/20 198 lb (89.8 kg)  08/05/20 183 lb (83 kg)  02/03/20 196 lb (88.9 kg)   BP Readings from Last 3 Encounters:  11/01/20 130/76  08/05/20 138/76  07/16/20 132/72         Past Medical History:  Diagnosis Date  . Cerebrovascular disease 02/05/2019  . Concussion '06, '11  . Diabetes mellitus    diet management  . Family history of colon cancer 02/28/2016  . Fibromyalgia 10/08/2013  . Hyperlipidemia    diet managed  . Varicella    Past Surgical History:  Procedure Laterality Date  . APPENDECTOMY  1979  . [place]  . CHOLECYSTECTOMY  1979   laparotomy    reports that she has never smoked. She has never used smokeless tobacco. She reports that she does not drink alcohol and does not use drugs. family history includes COPD in her mother; Cancer (age of onset: 10) in her mother; Diabetes in her maternal grandfather and  mother; Heart disease in her father and mother; Rheumatic fever in her father. Allergies  Allergen Reactions  . Methylprednisolone Itching   Current Outpatient Medications on File Prior to Visit  Medication Sig Dispense Refill  . benzonatate (TESSALON PERLES) 100 MG capsule Take 1 capsule (100 mg total) by mouth 2 (two) times daily as needed for cough. 60 capsule 0  . cetirizine (ZYRTEC) 10 MG tablet Take 1 tablet (10 mg total) by mouth daily. 90 tablet 3  . Cholecalciferol (CVS D3) 50 MCG (2000 UT) CAPS Take by mouth.     . Cyanocobalamin (CVS VITAMIN B-12 PO) Take 2,500 tablets by mouth every other day.    . diazepam (VALIUM) 5 MG tablet Take 1 tablet (5 mg total) by mouth every 12 (twelve) hours as needed for anxiety. 10 tablet 2  . guaiFENesin (MUCINEX) 600 MG 12 hr tablet Take 2 tablets (1,200 mg total) by mouth 2 (two) times daily as needed. 60 tablet 1  . ibuprofen (ADVIL,MOTRIN) 200 MG tablet Take 200 mg by mouth every 6 (six) hours as needed.    . meclizine (ANTIVERT) 12.5 MG tablet Take 1 tablet (12.5 mg total) by mouth 3 (three) times daily as needed for dizziness. 30 tablet 1  . Multiple Vitamins-Minerals (MULTIVITAMIN WITH MINERALS) tablet Take 1 tablet by mouth daily.    . Multiple Vitamins-Minerals (PRESERVISION AREDS  2 PO) Take by mouth.    . promethazine-codeine (PHENERGAN WITH CODEINE) 6.25-10 MG/5ML syrup Take 5 mLs by mouth every 6 (six) hours as needed for cough. 180 mL 0  . rosuvastatin (CRESTOR) 40 MG tablet Take 1 tablet (40 mg total) by mouth daily. 90 tablet 3  . tiZANidine (ZANAFLEX) 4 MG tablet Take 1 tablet (4 mg total) by mouth every 6 (six) hours as needed for muscle spasms. 30 tablet 1  . traMADol (ULTRAM) 50 MG tablet Take 1 tablet (50 mg total) by mouth every 6 (six) hours as needed. 30 tablet 0  . triamcinolone (NASACORT AQ) 55 MCG/ACT AERO nasal inhaler Place 2 sprays into the nose daily. 3 Inhaler 3  . triamcinolone cream (KENALOG) 0.1 % Apply 1 application  topically 2 (two) times daily. 30 g 0  . nystatin (MYCOSTATIN) 100000 UNIT/ML suspension Take 5 mLs (500,000 Units total) by mouth in the morning, at noon, and at bedtime. Gargle, swish and spit (Patient not taking: Reported on 11/01/2020) 60 mL 0  . oxybutynin (DITROPAN-XL) 10 MG 24 hr tablet oxybutynin chloride ER 10 mg tablet,extended release 24 hr (Patient not taking: Reported on 11/01/2020)     No current facility-administered medications on file prior to visit.        ROS:  All others reviewed and negative.  Objective        PE:  BP 130/76 (BP Location: Right Arm, Patient Position: Sitting, Cuff Size: Large)   Pulse 75   Temp 99.5 F (37.5 C) (Oral)   Ht 5\' 2"  (1.575 m)   Wt 198 lb (89.8 kg)   SpO2 97%   BMI 36.21 kg/m                 Constitutional: Pt appears in NAD               HENT: Head: NCAT.                Right Ear: External ear normal.                 Left Ear: External ear normal.                Eyes: . Pupils are equal, round, and reactive to light. Conjunctivae and EOM are normal; Bilat tm's with mild erythema.  Max sinus areas mild tender.  Pharynx with mild erythema, no exudate               Nose: without d/c or deformity               Neck: Neck supple. Gross normal ROM               Cardiovascular: Normal rate and regular rhythm.                 Pulmonary/Chest: Effort normal and breath sounds without rales or wheezing.                Abd:  Soft, NT, ND, + BS, no organomegaly               Neurological: Pt is alert. At baseline orientation, motor grossly intact               Skin: Skin is warm. No rashes, no other new lesions, LE edema - none               Psychiatric: Pt behavior is normal without agitation   Micro: none  Cardiac tracings I have personally interpreted today:  none  Pertinent Radiological findings (summarize): none   Lab Results  Component Value Date   WBC 6.5 08/02/2020   HGB 12.8 08/02/2020   HCT 38.9 08/02/2020   PLT 353.0 08/02/2020    GLUCOSE 100 (H) 11/01/2020   CHOL 156 11/01/2020   TRIG 193.0 (H) 11/01/2020   HDL 50.90 11/01/2020   LDLDIRECT 152.0 08/02/2020   LDLCALC 67 11/01/2020   ALT 18 11/01/2020   AST 17 11/01/2020   NA 138 11/01/2020   K 3.9 11/01/2020   CL 102 11/01/2020   CREATININE 0.76 11/01/2020   BUN 18 11/01/2020   CO2 30 11/01/2020   TSH 2.60 08/02/2020   INR 0.92 05/24/2011   HGBA1C 6.2 11/01/2020   MICROALBUR 0.9 10/26/2015   Assessment/Plan:  Debra Atkinson is a 75 y.o. White or Caucasian [1] female with  has a past medical history of Cerebrovascular disease (02/05/2019), Concussion ('06, '11), Diabetes mellitus, Family history of colon cancer (02/28/2016), Fibromyalgia (10/08/2013), Hyperlipidemia, and Varicella.  Pre-diabetes Lab Results  Component Value Date   HGBA1C 6.2 11/01/2020   Stable, pt to continue current medical treatment  - diet   Hyperlipidemia Lab Results  Component Value Date   LDLCALC 67 11/01/2020   Stable, pt to continue current statin  - crestor 20   Fatigue Etiology unclear, Exam otherwise benign, to check labs as documented, follow with expectant management   Dyspnea Etiology unclear, exam benign, also for cxr  Urinary incontinence Ok for urine culture,  to f/u any worsening symptoms or concerns  URI (upper respiratory infection) Mild to mod, for antibx course,  to f/u any worsening symptoms or concerns  Followup: Return in about 3 months (around 02/03/2021).  Cathlean Cower, MD 11/08/2020 10:04 PM [facility]

## 2020-11-02 ENCOUNTER — Telehealth: Payer: Self-pay | Admitting: Internal Medicine

## 2020-11-02 ENCOUNTER — Encounter: Payer: Self-pay | Admitting: Internal Medicine

## 2020-11-02 LAB — URINE CULTURE

## 2020-11-02 LAB — URINALYSIS, ROUTINE W REFLEX MICROSCOPIC
Bilirubin Urine: NEGATIVE
Hgb urine dipstick: NEGATIVE
Ketones, ur: NEGATIVE
Nitrite: NEGATIVE
Specific Gravity, Urine: 1.015 (ref 1.000–1.030)
Total Protein, Urine: NEGATIVE
Urine Glucose: NEGATIVE
Urobilinogen, UA: 0.2 (ref 0.0–1.0)
pH: 7 (ref 5.0–8.0)

## 2020-11-02 NOTE — Telephone Encounter (Signed)
   Please call patient to discuss lab and imaging results

## 2020-11-03 NOTE — Telephone Encounter (Signed)
Results reviewed in detail with patient. Advised patient that once chest x-ray was reviewed, someone would notify her.

## 2020-11-04 ENCOUNTER — Encounter: Payer: Self-pay | Admitting: Internal Medicine

## 2020-11-08 ENCOUNTER — Encounter: Payer: Self-pay | Admitting: Internal Medicine

## 2020-11-08 DIAGNOSIS — R0600 Dyspnea, unspecified: Secondary | ICD-10-CM | POA: Insufficient documentation

## 2020-11-08 DIAGNOSIS — R32 Unspecified urinary incontinence: Secondary | ICD-10-CM | POA: Insufficient documentation

## 2020-11-08 DIAGNOSIS — J069 Acute upper respiratory infection, unspecified: Secondary | ICD-10-CM | POA: Insufficient documentation

## 2020-11-08 NOTE — Assessment & Plan Note (Signed)
Lab Results  Component Value Date   LDLCALC 67 11/01/2020   Stable, pt to continue current statin  - crestor 20

## 2020-11-08 NOTE — Assessment & Plan Note (Signed)
Mild to mod, for antibx course,  to f/u any worsening symptoms or concerns 

## 2020-11-08 NOTE — Assessment & Plan Note (Signed)
Etiology unclear, exam benign, also for cxr

## 2020-11-08 NOTE — Assessment & Plan Note (Signed)
Lab Results  Component Value Date   HGBA1C 6.2 11/01/2020   Stable, pt to continue current medical treatment  - diet

## 2020-11-08 NOTE — Assessment & Plan Note (Signed)
Etiology unclear, Exam otherwise benign, to check labs as documented, follow with expectant management  

## 2020-11-08 NOTE — Assessment & Plan Note (Signed)
Ok for urine culture,  to f/u any worsening symptoms or concerns

## 2020-12-06 ENCOUNTER — Other Ambulatory Visit: Payer: Self-pay

## 2020-12-06 ENCOUNTER — Ambulatory Visit: Payer: Medicare HMO

## 2020-12-07 ENCOUNTER — Encounter: Payer: Self-pay | Admitting: Internal Medicine

## 2020-12-07 ENCOUNTER — Ambulatory Visit (INDEPENDENT_AMBULATORY_CARE_PROVIDER_SITE_OTHER): Payer: Medicare HMO

## 2020-12-07 ENCOUNTER — Other Ambulatory Visit: Payer: Self-pay

## 2020-12-07 ENCOUNTER — Ambulatory Visit (INDEPENDENT_AMBULATORY_CARE_PROVIDER_SITE_OTHER): Payer: Medicare HMO | Admitting: Internal Medicine

## 2020-12-07 VITALS — BP 138/82 | HR 80 | Ht 62.0 in | Wt 198.8 lb

## 2020-12-07 VITALS — BP 138/82 | HR 80 | Ht 62.0 in | Wt 198.9 lb

## 2020-12-07 DIAGNOSIS — M25562 Pain in left knee: Secondary | ICD-10-CM | POA: Diagnosis not present

## 2020-12-07 DIAGNOSIS — Z0000 Encounter for general adult medical examination without abnormal findings: Secondary | ICD-10-CM

## 2020-12-07 DIAGNOSIS — M1712 Unilateral primary osteoarthritis, left knee: Secondary | ICD-10-CM | POA: Diagnosis not present

## 2020-12-07 DIAGNOSIS — S40811A Abrasion of right upper arm, initial encounter: Secondary | ICD-10-CM

## 2020-12-07 DIAGNOSIS — Z23 Encounter for immunization: Secondary | ICD-10-CM

## 2020-12-07 DIAGNOSIS — T148XXA Other injury of unspecified body region, initial encounter: Secondary | ICD-10-CM

## 2020-12-07 MED ORDER — TRAMADOL HCL 50 MG PO TABS: 50.0000 mg | ORAL_TABLET | Freq: Four times a day (QID) | ORAL | 0 refills | Status: DC | PRN
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
## 2020-12-07 NOTE — Progress Notes (Signed)
Patient ID: Debra Atkinson, female   DOB: Dec 28, 1945, 75 y.o.   MRN: [id number]        Chief Complaint: leg pain after a fall       HPI:  Debra Atkinson is a 75 y.o. female here with c/o left knee and leg pain, swelling and large contusion after a fall though weak boards in the mobile home flooring x 5 days; happened very quickly, but what seemed to happen was left through went through the flooring without scraping or abrasions but did end up strking left patella to the flooring primarily with large swelling to the left anterior knee and bruising that has since tracked to below the knees and distally; fortunately no significant other foot or toe injury, but did also notice later a very sore 2 cm area to the left post upper right leg where she struck that, as well as an abrasoin area to the medial right mid arm.  Has some right knee stiffness and overall sore, but has felt better overall with lots of ice to different areas of the left leg.  No falls since then, denies significant other calf or leg pain below the knee, and Pt denies chest pain, increased sob or doe, wheezing, orthopnea, PND, palpitations, dizziness or syncope.    Pt denies fever, wt loss, night sweats, loss of appetite, or other constitutional symptoms   Out of tramadol but asks for refill        Wt Readings from Last 3 Encounters:  12/07/20 198 lb 12.8 oz (90.2 kg)  11/01/20 198 lb (89.8 kg)  08/05/20 183 lb (83 kg)   BP Readings from Last 3 Encounters:  12/07/20 138/82  11/01/20 130/76  08/05/20 138/76         Past Medical History:  Diagnosis Date  . Cerebrovascular disease 02/05/2019  . Concussion '06, '11  . Diabetes mellitus    diet management  . Family history of colon cancer 02/28/2016  . Fibromyalgia 10/08/2013  . Hyperlipidemia    diet managed  . Varicella    Past Surgical History:  Procedure Laterality Date  . APPENDECTOMY  1979  . [place]  . CHOLECYSTECTOMY  1979   laparotomy    reports that  she has never smoked. She has never used smokeless tobacco. She reports that she does not drink alcohol and does not use drugs. family history includes COPD in her mother; Cancer (age of onset: 28) in her mother; Diabetes in her maternal grandfather and mother; Heart disease in her father and mother; Rheumatic fever in her father. Allergies  Allergen Reactions  . Methylprednisolone Itching   Current Outpatient Medications on File Prior to Visit  Medication Sig Dispense Refill  . cetirizine (ZYRTEC) 10 MG tablet Take 1 tablet (10 mg total) by mouth daily. 90 tablet 3  . Cholecalciferol (CVS D3) 50 MCG (2000 UT) CAPS Take by mouth.     . Cyanocobalamin (CVS VITAMIN B-12 PO) Take 2,500 tablets by mouth every other day.    . diazepam (VALIUM) 5 MG tablet Take 1 tablet (5 mg total) by mouth every 12 (twelve) hours as needed for anxiety. 10 tablet 2  . ibuprofen (ADVIL,MOTRIN) 200 MG tablet Take 200 mg by mouth every 6 (six) hours as needed.    . meclizine (ANTIVERT) 12.5 MG tablet Take 1 tablet (12.5 mg total) by mouth 3 (three) times daily as needed for dizziness. 30 tablet 1  . Multiple Vitamins-Minerals (MULTIVITAMIN WITH MINERALS) tablet Take  1 tablet by mouth daily.    . Multiple Vitamins-Minerals (PRESERVISION AREDS 2 PO) Take by mouth.    . rosuvastatin (CRESTOR) 40 MG tablet Take 1 tablet (40 mg total) by mouth daily. 90 tablet 3  . tiZANidine (ZANAFLEX) 4 MG tablet Take 1 tablet (4 mg total) by mouth every 6 (six) hours as needed for muscle spasms. 30 tablet 1  . triamcinolone (NASACORT AQ) 55 MCG/ACT AERO nasal inhaler Place 2 sprays into the nose daily. 3 Inhaler 3  . triamcinolone cream (KENALOG) 0.1 % Apply 1 application topically 2 (two) times daily. 30 g 0  . benzonatate (TESSALON PERLES) 100 MG capsule Take 1 capsule (100 mg total) by mouth 2 (two) times daily as needed for cough. (Patient not taking: Reported on 12/07/2020) 60 capsule 0  . doxycycline (VIBRA-TABS) 100 MG tablet Take 1  tablet (100 mg total) by mouth 2 (two) times daily. (Patient not taking: Reported on 12/07/2020) 20 tablet 0  . guaiFENesin (MUCINEX) 600 MG 12 hr tablet Take 2 tablets (1,200 mg total) by mouth 2 (two) times daily as needed. (Patient not taking: Reported on 12/07/2020) 60 tablet 1  . nystatin (MYCOSTATIN) 100000 UNIT/ML suspension Take 5 mLs (500,000 Units total) by mouth in the morning, at noon, and at bedtime. Gargle, swish and spit (Patient not taking: No sig reported) 60 mL 0  . oxybutynin (DITROPAN-XL) 10 MG 24 hr tablet oxybutynin chloride ER 10 mg tablet,extended release 24 hr (Patient not taking: No sig reported)    . promethazine-codeine (PHENERGAN WITH CODEINE) 6.25-10 MG/5ML syrup Take 5 mLs by mouth every 6 (six) hours as needed for cough. (Patient not taking: Reported on 12/07/2020) 180 mL 0  . tolterodine (DETROL LA) 2 MG 24 hr capsule tolterodine ER 2 mg capsule,extended release 24 hr  Take 1 capsule every day by oral route in the morning. (Patient not taking: Reported on 12/07/2020)     No current facility-administered medications on file prior to visit.        ROS:  All others reviewed and negative.  Objective        PE:  BP 138/82 (BP Location: Left Arm, Patient Position: Sitting, Cuff Size: Large)   Pulse 80   Ht 5\' 2"  (1.575 m)   Wt 198 lb 12.8 oz (90.2 kg)   SpO2 97%   BMI 36.36 kg/m                 Constitutional: Pt appears in NAD               HENT: Head: NCAT.                Right Ear: External ear normal.                 Left Ear: External ear normal.                Eyes: . Pupils are equal, round, and reactive to light. Conjunctivae and EOM are normal               Nose: without d/c or deformity               Neck: Neck supple. Gross normal ROM               Cardiovascular: Normal rate and regular rhythm.                 Pulmonary/Chest: Effort normal and breath sounds without rales or wheezing.  Abd:  Soft, NT, ND, + BS, no organomegaly                Neurological: Pt is alert. At baseline orientation, motor grossly intact               Skin: Skin is warm. No rashes, no other new lesions, LLE with large anterior  Knee contusion and swelling o/w no instability, and brusing tracking distal to right mid leg, o/w neurovasc intact; also noted is right post thigh 2 cm area subq hematoma like mass without skin bruising just distal to the gluteus maximus, and right mid medial upper arm with 1 cm are abrasion only               Psychiatric: Pt behavior is normal without agitation   Micro: none  Cardiac tracings I have personally interpreted today:  none  Pertinent Radiological findings (summarize): none   Lab Results  Component Value Date   WBC 6.5 08/02/2020   HGB 12.8 08/02/2020   HCT 38.9 08/02/2020   PLT 353.0 08/02/2020   GLUCOSE 100 (H) 11/01/2020   CHOL 156 11/01/2020   TRIG 193.0 (H) 11/01/2020   HDL 50.90 11/01/2020   LDLDIRECT 152.0 08/02/2020   LDLCALC 67 11/01/2020   ALT 18 11/01/2020   AST 17 11/01/2020   NA 138 11/01/2020   K 3.9 11/01/2020   CL 102 11/01/2020   CREATININE 0.76 11/01/2020   BUN 18 11/01/2020   CO2 30 11/01/2020   TSH 2.60 08/02/2020   INR 0.92 05/24/2011   HGBA1C 6.2 11/01/2020   MICROALBUR 0.9 10/26/2015   Assessment/Plan:  Debra Atkinson is a 75 y.o. White or Caucasian [1] female with  has a past medical history of Cerebrovascular disease (02/05/2019), Concussion ('06, '11), Diabetes mellitus, Family history of colon cancer (02/28/2016), Fibromyalgia (10/08/2013), Hyperlipidemia, and Varicella.  Acute pain of left knee Cant r/o patellar fx, for left knee xray, tramadol prn, leg elevated, tylenol, rest, and gradual increase ambulation as tolerated, consider f/u with sports medicine for any persistent pain or swelling at 3 wks or worsening , also for Tdap  Hematoma 2 cm area to right upper post upper leg without bruising, d/w pt - no need venous doppler for this, and expect natural healing without other  specific tx  Abrasion of right arm Mild, due to trauma as above, d/w pt natural hx and should heal soon, no evidence for cellulitis, cont to follow for any worsening s/s.  Followup: Return if symptoms worsen or fail to improve.  Cathlean Cower, MD 12/07/2020 9:38 AM [facility]\ov

## 2020-12-07 NOTE — Assessment & Plan Note (Addendum)
Cant r/o patellar fx, for left knee xray, tramadol prn, leg elevated, tylenol, rest, and gradual increase ambulation as tolerated, consider f/u with sports medicine for any persistent pain or swelling at 3 wks or worsening , also for Tdap

## 2020-12-07 NOTE — Patient Instructions (Signed)
You had the Tdap tetanus shot today  Please continue all other medications as before, including the tramadol refill for pain  Please have the pharmacy call with any other refills you may need.  Please keep your appointments with your specialists as you may have planned  Please go to the XRAY Department in the first floor for the x-ray testing  You will be contacted by phone if any changes need to be made immediately.  Otherwise, you will receive a letter about your results with an explanation, but please check with MyChart first.  Please remember to sign up for MyChart if you have not done so, as this will be important to you in the future with finding out test results, communicating by private email, and scheduling acute appointments online when needed.
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
## 2020-12-07 NOTE — Assessment & Plan Note (Signed)
2 cm area to right upper post upper leg without bruising, d/w pt - no need venous doppler for this, and expect natural healing without other specific tx

## 2020-12-07 NOTE — Patient Instructions (Signed)
Debra Atkinson , Thank you for taking time to come for your Medicare Wellness Visit. I appreciate your ongoing commitment to your health goals. Please review the following plan we discussed and let me know if I can assist you in the future.   Screening recommendations/referrals: Colonoscopy: 02/19/2017; due every 5 years Mammogram: 05/11/2020; due every 2 years Bone Density: 02/02/2015; due every 2 years Recommended yearly ophthalmology/optometry visit for glaucoma screening and checkup Recommended yearly dental visit for hygiene and checkup  Vaccinations: Influenza vaccine: 06/2020 Pneumococcal vaccine: 05/04/2011, 10/08/2014 Tdap vaccine: 12/07/2020 Shingles vaccine: never done   Covid-19: never done  Advanced directives: Advance directive discussed with you today. Even though you declined this today please call our office should you change your mind and we can give you the proper paperwork for you to fill out.  Conditions/risks identified: Yes; Reviewed health maintenance screenings with patient today and relevant education, vaccines, and/or referrals were provided. Please continue to do your personal lifestyle choices by: daily care of teeth and gums, regular physical activity (goal should be 5 days a week for 30 minutes), eat a healthy diet, avoid tobacco and drug use, limiting any alcohol intake, taking a low-dose aspirin (if not allergic or have been advised by your provider otherwise) and taking vitamins and minerals as recommended by your provider. Continue doing brain stimulating activities (puzzles, reading, adult coloring books, staying active) to keep memory sharp. Continue to eat heart healthy diet (full of fruits, vegetables, whole grains, lean protein, water--limit salt, fat, and sugar intake) and increase physical activity as tolerated.  Next appointment: Please schedule your next Medicare Wellness Visit with your Nurse Health Advisor in 1 year by calling [phone].  Preventive Care  110 Years and Older, Female Preventive care refers to lifestyle choices and visits with your health care provider that can promote health and wellness. What does preventive care include?  A yearly physical exam. This is also called an annual well check.  Dental exams once or twice a year.  Routine eye exams. Ask your health care provider how often you should have your eyes checked.  Personal lifestyle choices, including:  Daily care of your teeth and gums.  Regular physical activity.  Eating a healthy diet.  Avoiding tobacco and drug use.  Limiting alcohol use.  Practicing safe sex.  Taking low-dose aspirin every day.  Taking vitamin and mineral supplements as recommended by your health care provider. What happens during an annual well check? The services and screenings done by your health care provider during your annual well check will depend on your age, overall health, lifestyle risk factors, and family history of disease. Counseling  Your health care provider may ask you questions about your:  Alcohol use.  Tobacco use.  Drug use.  Emotional well-being.  Home and relationship well-being.  Sexual activity.  Eating habits.  History of falls.  Memory and ability to understand (cognition).  Work and work Statistician.  Reproductive health. Screening  You may have the following tests or measurements:  Height, weight, and BMI.  Blood pressure.  Lipid and cholesterol levels. These may be checked every 5 years, or more frequently if you are over 86 years old.  Skin check.  Lung cancer screening. You may have this screening every year starting at age 64 if you have a 30-pack-year history of smoking and currently smoke or have quit within the past 15 years.  Fecal occult blood test (FOBT) of the stool. You may have this test every year starting  at age 58.  Flexible sigmoidoscopy or colonoscopy. You may have a sigmoidoscopy every 5 years or a colonoscopy  every 10 years starting at age 53.  Hepatitis C blood test.  Hepatitis B blood test.  Sexually transmitted disease (STD) testing.  Diabetes screening. This is done by checking your blood sugar (glucose) after you have not eaten for a while (fasting). You may have this done every 1-3 years.  Bone density scan. This is done to screen for osteoporosis. You may have this done starting at age 21.  Mammogram. This may be done every 1-2 years. Talk to your health care provider about how often you should have regular mammograms. Talk with your health care provider about your test results, treatment options, and if necessary, the need for more tests. Vaccines  Your health care provider may recommend certain vaccines, such as:  Influenza vaccine. This is recommended every year.  Tetanus, diphtheria, and acellular pertussis (Tdap, Td) vaccine. You may need a Td booster every 10 years.  Zoster vaccine. You may need this after age 63.  Pneumococcal 13-valent conjugate (PCV13) vaccine. One dose is recommended after age 80.  Pneumococcal polysaccharide (PPSV23) vaccine. One dose is recommended after age 62. Talk to your health care provider about which screenings and vaccines you need and how often you need them. This information is not intended to replace advice given to you by your health care provider. Make sure you discuss any questions you have with your health care provider. Document Released: 07/15/2015 Document Revised: 03/07/2016 Document Reviewed: 04/19/2015 Elsevier Interactive Patient Education  2017 [place] Prevention in the Home Falls can cause injuries. They can happen to people of all ages. There are many things you can do to make your home safe and to help prevent falls. What can I do on the outside of my home?  Regularly fix the edges of walkways and driveways and fix any cracks.  Remove anything that might make you trip as you walk through a door, such as a  raised step or threshold.  Trim any bushes or trees on the path to your home.  Use bright outdoor lighting.  Clear any walking paths of anything that might make someone trip, such as rocks or tools.  Regularly check to see if handrails are loose or broken. Make sure that both sides of any steps have handrails.  Any raised decks and porches should have guardrails on the edges.  Have any leaves, snow, or ice cleared regularly.  Use sand or salt on walking paths during winter.  Clean up any spills in your garage right away. This includes oil or grease spills. What can I do in the bathroom?  Use night lights.  Install grab bars by the toilet and in the tub and shower. Do not use towel bars as grab bars.  Use non-skid mats or decals in the tub or shower.  If you need to sit down in the shower, use a plastic, non-slip stool.  Keep the floor dry. Clean up any water that spills on the floor as soon as it happens.  Remove soap buildup in the tub or shower regularly.  Attach bath mats securely with double-sided non-slip rug tape.  Do not have throw rugs and other things on the floor that can make you trip. What can I do in the bedroom?  Use night lights.  Make sure that you have a light by your bed that is easy to reach.  Do not use  any sheets or blankets that are too big for your bed. They should not hang down onto the floor.  Have a firm chair that has side arms. You can use this for support while you get dressed.  Do not have throw rugs and other things on the floor that can make you trip. What can I do in the kitchen?  Clean up any spills right away.  Avoid walking on wet floors.  Keep items that you use a lot in easy-to-reach places.  If you need to reach something above you, use a strong step stool that has a grab bar.  Keep electrical cords out of the way.  Do not use floor polish or wax that makes floors slippery. If you must use wax, use non-skid floor  wax.  Do not have throw rugs and other things on the floor that can make you trip. What can I do with my stairs?  Do not leave any items on the stairs.  Make sure that there are handrails on both sides of the stairs and use them. Fix handrails that are broken or loose. Make sure that handrails are as long as the stairways.  Check any carpeting to make sure that it is firmly attached to the stairs. Fix any carpet that is loose or worn.  Avoid having throw rugs at the top or bottom of the stairs. If you do have throw rugs, attach them to the floor with carpet tape.  Make sure that you have a light switch at the top of the stairs and the bottom of the stairs. If you do not have them, ask someone to add them for you. What else can I do to help prevent falls?  Wear shoes that:  Do not have high heels.  Have rubber bottoms.  Are comfortable and fit you well.  Are closed at the toe. Do not wear sandals.  If you use a stepladder:  Make sure that it is fully opened. Do not climb a closed stepladder.  Make sure that both sides of the stepladder are locked into place.  Ask someone to hold it for you, if possible.  Clearly mark and make sure that you can see:  Any grab bars or handrails.  First and last steps.  Where the edge of each step is.  Use tools that help you move around (mobility aids) if they are needed. These include:  Canes.  Walkers.  Scooters.  Crutches.  Turn on the lights when you go into a dark area. Replace any light bulbs as soon as they burn out.  Set up your furniture so you have a clear path. Avoid moving your furniture around.  If any of your floors are uneven, fix them.  If there are any pets around you, be aware of where they are.  Review your medicines with your doctor. Some medicines can make you feel dizzy. This can increase your chance of falling. Ask your doctor what other things that you can do to help prevent falls. This information is  not intended to replace advice given to you by your health care provider. Make sure you discuss any questions you have with your health care provider. Document Released: 04/14/2009 Document Revised: 11/24/2015 Document Reviewed: 07/23/2014 Elsevier Interactive Patient Education  2017 [employer].

## 2020-12-09 ENCOUNTER — Encounter: Payer: Self-pay | Admitting: Internal Medicine

## 2020-12-26 ENCOUNTER — Other Ambulatory Visit: Payer: Self-pay | Admitting: Internal Medicine

## 2020-12-28 DIAGNOSIS — D1801 Hemangioma of skin and subcutaneous tissue: Secondary | ICD-10-CM | POA: Diagnosis not present

## 2020-12-28 DIAGNOSIS — L738 Other specified follicular disorders: Secondary | ICD-10-CM | POA: Diagnosis not present

## 2020-12-28 DIAGNOSIS — L239 Allergic contact dermatitis, unspecified cause: Secondary | ICD-10-CM | POA: Diagnosis not present
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
## 2020-12-30 ENCOUNTER — Other Ambulatory Visit: Payer: Self-pay

## 2020-12-30 ENCOUNTER — Encounter: Payer: Self-pay | Admitting: Internal Medicine

## 2020-12-30 ENCOUNTER — Ambulatory Visit (INDEPENDENT_AMBULATORY_CARE_PROVIDER_SITE_OTHER): Payer: Medicare HMO | Admitting: Internal Medicine

## 2020-12-30 VITALS — BP 142/78 | HR 94 | Temp 99.1°F | Ht 62.0 in | Wt 201.0 lb

## 2020-12-30 DIAGNOSIS — R21 Rash and other nonspecific skin eruption: Secondary | ICD-10-CM

## 2020-12-30 NOTE — Patient Instructions (Signed)
    Continue the steroid cream prescribed by your dermatologist.

## 2021-01-16 DIAGNOSIS — H2513 Age-related nuclear cataract, bilateral: Secondary | ICD-10-CM | POA: Diagnosis not present

## 2021-01-16 DIAGNOSIS — H353132 Nonexudative age-related macular degeneration, bilateral, intermediate dry stage: Secondary | ICD-10-CM | POA: Diagnosis not present

## 2021-01-16 DIAGNOSIS — E119 Type 2 diabetes mellitus without complications: Secondary | ICD-10-CM | POA: Diagnosis not present

## 2021-01-18 ENCOUNTER — Telehealth: Payer: Self-pay | Admitting: Internal Medicine

## 2021-01-18 DIAGNOSIS — E559 Vitamin D deficiency, unspecified: Secondary | ICD-10-CM

## 2021-01-18 DIAGNOSIS — R7303 Prediabetes: Secondary | ICD-10-CM

## 2021-01-18 DIAGNOSIS — E538 Deficiency of other specified B group vitamins: Secondary | ICD-10-CM

## 2021-01-18 NOTE — Telephone Encounter (Signed)
OV scheduled 8/5, lab appt scheduled 8/1-no lab orders found.

## 2021-01-18 NOTE — Telephone Encounter (Signed)
Ok labs done 

## 2021-01-30 ENCOUNTER — Other Ambulatory Visit (INDEPENDENT_AMBULATORY_CARE_PROVIDER_SITE_OTHER): Payer: Medicare HMO

## 2021-01-30 ENCOUNTER — Other Ambulatory Visit: Payer: Self-pay

## 2021-01-30 DIAGNOSIS — E538 Deficiency of other specified B group vitamins: Secondary | ICD-10-CM | POA: Diagnosis not present

## 2021-01-30 DIAGNOSIS — E559 Vitamin D deficiency, unspecified: Secondary | ICD-10-CM

## 2021-01-30 DIAGNOSIS — R7303 Prediabetes: Secondary | ICD-10-CM | POA: Diagnosis not present

## 2021-01-30 LAB — LIPID PANEL
Cholesterol: 166 mg/dL (ref 0–200)
HDL: 53.5 mg/dL (ref >39.00)
LDL Cholesterol: 80 mg/dL (ref 0–99)
NonHDL: 112.74
Total CHOL/HDL Ratio: 3
Triglycerides: 163 mg/dL — ABNORMAL HIGH (ref 0.0–149.0)
VLDL: 32.6 mg/dL (ref 0.0–40.0)

## 2021-01-30 LAB — BASIC METABOLIC PANEL
BUN: 14 mg/dL (ref 6–23)
CO2: 30 mEq/L (ref 19–32)
Calcium: 9.6 mg/dL (ref 8.4–10.5)
Chloride: 105 mEq/L (ref 96–112)
Creatinine, Ser: 0.79 mg/dL (ref 0.40–1.20)
GFR: 73.42 mL/min (ref >60.00)
Glucose, Bld: 107 mg/dL — ABNORMAL HIGH (ref 70–99)
Potassium: 4.5 mEq/L (ref 3.5–5.1)
Sodium: 141 mEq/L (ref 135–145)

## 2021-01-30 LAB — HEPATIC FUNCTION PANEL
ALT: 20 U/L (ref 0–35)
AST: 16 U/L (ref 0–37)
Albumin: 3.9 g/dL (ref 3.5–5.2)
Alkaline Phosphatase: 79 U/L (ref 39–117)
Bilirubin, Direct: 0.1 mg/dL (ref 0.0–0.3)
Total Bilirubin: 0.5 mg/dL (ref 0.2–1.2)
Total Protein: 6.9 g/dL (ref 6.0–8.3)

## 2021-01-30 LAB — VITAMIN D 25 HYDROXY (VIT D DEFICIENCY, FRACTURES): VITD: 38.69 ng/mL (ref 30.00–100.00)

## 2021-01-30 LAB — HEMOGLOBIN A1C: Hgb A1c MFr Bld: 6.3 % (ref 4.6–6.5)

## 2021-01-30 LAB — VITAMIN B12: Vitamin B-12: 739 pg/mL (ref 211–911)

## 2021-02-03 ENCOUNTER — Ambulatory Visit: Payer: Medicare HMO | Admitting: Internal Medicine

## 2021-02-07 ENCOUNTER — Other Ambulatory Visit: Payer: Self-pay

## 2021-02-07 ENCOUNTER — Ambulatory Visit (INDEPENDENT_AMBULATORY_CARE_PROVIDER_SITE_OTHER): Payer: Medicare HMO | Admitting: Internal Medicine

## 2021-02-07 ENCOUNTER — Encounter: Payer: Self-pay | Admitting: Internal Medicine

## 2021-02-07 VITALS — BP 138/80 | HR 78 | Temp 98.2°F | Ht 62.0 in | Wt 201.0 lb

## 2021-02-07 DIAGNOSIS — E7800 Pure hypercholesterolemia, unspecified: Secondary | ICD-10-CM

## 2021-02-07 DIAGNOSIS — E538 Deficiency of other specified B group vitamins: Secondary | ICD-10-CM | POA: Diagnosis not present

## 2021-02-07 DIAGNOSIS — E559 Vitamin D deficiency, unspecified: Secondary | ICD-10-CM

## 2021-02-07 DIAGNOSIS — J309 Allergic rhinitis, unspecified: Secondary | ICD-10-CM | POA: Diagnosis not present

## 2021-02-07 DIAGNOSIS — R7303 Prediabetes: Secondary | ICD-10-CM

## 2021-02-07 NOTE — Progress Notes (Signed)
Patient ID: Debra Atkinson, female   DOB: 29-Aug-1945, 75 y.o.   MRN: [id number]        Chief Complaint: follow up HLD and hyperglycemia , low vit d and b12, allergies       HPI:  Debra Atkinson is a 75 y.o. female here overall doing well,, Pt denies chest pain, increased sob or doe, wheezing, orthopnea, PND, increased LE swelling, palpitations, dizziness or syncope.   Pt denies polydipsia, polyuria, or new focal neuro s/s.  [name] Pt denies fever, wt loss, night sweats, loss of appetite, or other constitutional symptoms  Does have several wks ongoing nasal allergy symptoms with clearish congestion, itch and sneezing, without fever, pain, ST, cough, swelling or wheezing.       Wt Readings from Last 3 Encounters:  02/07/21 201 lb (91.2 kg)  12/30/20 201 lb (91.2 kg)  12/07/20 198 lb 13.7 oz (90.2 kg)   BP Readings from Last 3 Encounters:  02/07/21 138/80  12/30/20 (!) 142/78  12/07/20 138/82         Past Medical History:  Diagnosis Date   Cerebrovascular disease 02/05/2019   Concussion '06, '11   Diabetes mellitus    diet management   Family history of colon cancer 02/28/2016   Fibromyalgia 10/08/2013   Hyperlipidemia    diet managed   Varicella    Past Surgical History:  Procedure Laterality Date   [place]   laparotomy    reports that she has never smoked. She has never used smokeless tobacco. She reports that she does not drink alcohol and does not use drugs. family history includes COPD in her mother; Cancer (age of onset: 43) in her mother; Diabetes in her maternal grandfather and mother; Heart disease in her father and mother; Rheumatic fever in her father. Allergies  Allergen Reactions   Methylprednisolone Itching   Current Outpatient Medications on File Prior to Visit  Medication Sig Dispense Refill   benzonatate (TESSALON PERLES) 100 MG capsule Take 1 capsule (100 mg total) by mouth 2 (two) times daily as needed  for cough. 60 capsule 0   cetirizine (ZYRTEC) 10 MG tablet Take 1 tablet (10 mg total) by mouth daily. 90 tablet 3   Cholecalciferol ([employer]S D3) 50 MCG (2000 UT) CAPS Take by mouth.      Cyanocobalamin (CVS VITAMIN B-12 PO) Take 2,500 tablets by mouth every other day.     diazepam (VALIUM) 5 MG tablet Take 1 tablet (5 mg total) by mouth every 12 (twelve) hours as needed for anxiety. 10 tablet 2   fluocinonide cream (LIDEX) [id number] % Apply 1 application topically 2 (two) times daily.     ibuprofen (ADVIL,MOTRIN) 200 MG tablet Take 200 mg by mouth every 6 (six) hours as needed.     Multiple Vitamins-Minerals (MULTIVITAMIN WITH MINERALS) tablet Take 1 tablet by mouth daily.     Multiple Vitamins-Minerals (PRESERVISION AREDS 2 PO) Take by mouth.     rosuvastatin (CRESTOR) 40 MG tablet Take 1 tablet by mouth once daily (Patient taking differently: 20 mg.) 90 tablet 3   tiZANidine (ZANAFLEX) 4 MG tablet Take 1 tablet (4 mg total) by mouth every 6 (six) hours as needed for muscle spasms. 30 tablet 1   traMADol (ULTRAM) 50 MG tablet Take 1 tablet (50 mg total) by mouth every 6 (six) hours as needed. 30 tablet 0   triamcinolone (NASACORT AQ) 55 MCG/ACT AERO nasal inhaler  Place 2 sprays into the nose daily. 3 Inhaler 3   No current facility-administered medications on file prior to visit.        ROS:  All others reviewed and negative.  Objective        PE:  BP 138/80 (BP Location: Left Arm, Patient Position: Sitting, Cuff Size: Large)   Pulse 78   Temp 98.2 F (36.8 C) (Oral)   Ht '5\' 2"'$  (1.575 m)   Wt 201 lb (91.2 kg)   SpO2 97%   BMI 36.76 kg/m                 Constitutional: Pt appears in NAD               HENT: Head: NCAT.                Right Ear: External ear normal.                 Left Ear: External ear normal.                Eyes: . Pupils are equal, round, and reactive to light. Conjunctivae and EOM are normal; Bilat tm's with mild erythema.  Max sinus areas non tender.  Pharynx with mild  erythema, no exudate                Nose: without d/c or deformity               Neck: Neck supple. Gross normal ROM               Cardiovascular: Normal rate and regular rhythm.                 Pulmonary/Chest: Effort normal and breath sounds without rales or wheezing.                Abd:  Soft, NT, ND, + BS, no organomegaly               Neurological: Pt is alert. At baseline orientation, motor grossly intact               Skin: Skin is warm. No rashes, no other new lesions, LE edema - none               Psychiatric: Pt behavior is normal without agitation   Micro: none  Cardiac tracings I have personally interpreted today:  none  Pertinent Radiological findings (summarize): none   Lab Results  Component Value Date   WBC 6.5 08/02/2020   HGB 12.8 08/02/2020   HCT 38.9 08/02/2020   PLT 353.0 08/02/2020   GLUCOSE 107 (H) 01/30/2021   CHOL 166 01/30/2021   TRIG 163.0 (H) 01/30/2021   HDL 53.50 01/30/2021   LDLDIRECT 152.0 08/02/2020   LDLCALC 80 01/30/2021   ALT 20 01/30/2021   AST 16 01/30/2021   NA 141 01/30/2021   K 4.5 01/30/2021   CL 105 01/30/2021   CREATININE 0.79 01/30/2021   BUN 14 01/30/2021   CO2 30 01/30/2021   TSH 2.60 08/02/2020   INR 0.92 05/24/2011   HGBA1C 6.3 01/30/2021   MICROALBUR 0.9 10/26/2015   Assessment/Plan:  Debra Atkinson is a 75 y.o. White or Caucasian [1] female with  has a past medical history of Cerebrovascular disease (02/05/2019), Concussion ('06, '11), Diabetes mellitus, Family history of colon cancer (02/28/2016), Fibromyalgia (10/08/2013), Hyperlipidemia, and Varicella.  Vitamin D deficiency Last vitamin D Lab Results  Component Value Date  VD25OH 38.69 01/30/2021   Stable, cont oral replacement   Pre-diabetes Lab Results  Component Value Date   HGBA1C 6.3 01/30/2021   Stable, pt to continue current medical treatment  - diet   Hyperlipidemia Lab Results  Component Value Date   [place] 80 01/30/2021   Uncontrolled,  goal ld l < 70, , pt to increased current crestor 40 mg   B12 deficiency Lab Results  Component Value Date   [id number] 01/30/2021   Stable, cont oral replacement - b12 1000 mcg qd   Allergic rhinitis Mild uncontrolled, to add nasacort,  to f/u any worsening symptoms or concerns  Followup: No follow-ups on file.  Cathlean Cower, MD 02/11/2021 3:43 PM [facility]

## 2021-02-07 NOTE — Patient Instructions (Signed)
Ok to take the OTC Nasacort for allergies in addition to the cetirizine  Ok to increase the crestor to 40 mg per day (the whole pill)  Please continue all other medications as before, and refills have been done if requested.  Please have the pharmacy call with any other refills you may need.  Please continue your efforts at being more active, low cholesterol diet, and weight control.  Please keep your appointments with your specialists as you may have planned  Please make an Appointment to return in 6 months, or sooner if needed, also with Lab Appointment for testing done 3-5 days before at the [facility] (so this is for TWO appointments - please see the scheduling desk as you leave)  Due to the ongoing Covid 19 pandemic, our lab now requires an appointment for any labs done at our office.  If you need labs done and do not have an appointment, please call our office ahead of time to schedule before presenting to the lab for your testing.

## 2021-02-11 NOTE — Assessment & Plan Note (Signed)
Last vitamin D Lab Results  Component Value Date   VD25OH 38.69 01/30/2021   Stable, cont oral replacement

## 2021-02-11 NOTE — Assessment & Plan Note (Signed)
Lab Results  Component Value Date   HGBA1C 6.3 01/30/2021   Stable, pt to continue current medical treatment  - diet

## 2021-02-11 NOTE — Assessment & Plan Note (Signed)
Mild uncontrolled, to add nasacort,  to f/u any worsening symptoms or concerns

## 2021-02-11 NOTE — Assessment & Plan Note (Signed)
Lab Results  Component Value Date   LDLCALC 80 01/30/2021   Uncontrolled, goal ld l < 70, , pt to increased current crestor 40 mg

## 2021-02-11 NOTE — Addendum Note (Signed)
Addended by: Biagio Borg on: 02/11/2021 03:44 PM   Modules accepted: Orders

## 2021-02-11 NOTE — Assessment & Plan Note (Signed)
Lab Results  Component Value Date   [id number] 01/30/2021   Stable, cont oral replacement - b12 1000 mcg qd

## 2021-03-07 ENCOUNTER — Ambulatory Visit
Admission: EM | Admit: 2021-03-07 | Discharge: 2021-03-07 | Disposition: A | Discharge status: Home / Self Care | Payer: Medicare HMO | Attending: Family Medicine | Admitting: Family Medicine

## 2021-03-07 ENCOUNTER — Other Ambulatory Visit: Payer: Self-pay

## 2021-03-07 ENCOUNTER — Encounter: Payer: Self-pay | Admitting: Emergency Medicine

## 2021-03-07 DIAGNOSIS — L255 Unspecified contact dermatitis due to plants, except food: Secondary | ICD-10-CM

## 2021-03-07 MED ORDER — PREDNISONE 10 MG (21) PO TBPK: ORAL_TABLET | Freq: Every day | ORAL | 0 refills | Status: DC

## 2021-03-07 NOTE — ED Triage Notes (Signed)
Itchy Rash to chest, under arms and bottom of LT leg that started today. Pt states she has been doing a lot of outdoor work.
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
## 2021-03-28 DIAGNOSIS — L821 Other seborrheic keratosis: Secondary | ICD-10-CM | POA: Diagnosis not present

## 2021-03-28 DIAGNOSIS — S40861A Insect bite (nonvenomous) of right upper arm, initial encounter: Secondary | ICD-10-CM | POA: Diagnosis not present

## 2021-04-16 ENCOUNTER — Ambulatory Visit (HOSPITAL_COMMUNITY)
Admission: EM | Admit: 2021-04-16 | Discharge: 2021-04-16 | Disposition: A | Discharge status: Home / Self Care | Payer: Medicare HMO | Attending: Urgent Care | Admitting: Urgent Care

## 2021-04-16 ENCOUNTER — Other Ambulatory Visit: Payer: Self-pay

## 2021-04-16 ENCOUNTER — Encounter (HOSPITAL_COMMUNITY): Payer: Self-pay | Admitting: Emergency Medicine

## 2021-04-16 ENCOUNTER — Ambulatory Visit (INDEPENDENT_AMBULATORY_CARE_PROVIDER_SITE_OTHER): Payer: Medicare HMO

## 2021-04-16 DIAGNOSIS — Z9049 Acquired absence of other specified parts of digestive tract: Secondary | ICD-10-CM | POA: Insufficient documentation

## 2021-04-16 DIAGNOSIS — Z833 Family history of diabetes mellitus: Secondary | ICD-10-CM | POA: Diagnosis not present

## 2021-04-16 DIAGNOSIS — Z888 Allergy status to other drugs, medicaments and biological substances status: Secondary | ICD-10-CM | POA: Diagnosis not present

## 2021-04-16 DIAGNOSIS — R509 Fever, unspecified: Secondary | ICD-10-CM

## 2021-04-16 DIAGNOSIS — J069 Acute upper respiratory infection, unspecified: Secondary | ICD-10-CM | POA: Diagnosis not present

## 2021-04-16 DIAGNOSIS — Z79899 Other long term (current) drug therapy: Secondary | ICD-10-CM | POA: Diagnosis not present

## 2021-04-16 DIAGNOSIS — U071 COVID-19: Secondary | ICD-10-CM | POA: Insufficient documentation

## 2021-04-16 DIAGNOSIS — R059 Cough, unspecified: Secondary | ICD-10-CM | POA: Diagnosis not present

## 2021-04-16 DIAGNOSIS — E119 Type 2 diabetes mellitus without complications: Secondary | ICD-10-CM | POA: Diagnosis not present

## 2021-04-16 MED ORDER — BENZONATATE 100 MG PO CAPS: 100.0000 mg | ORAL_CAPSULE | Freq: Three times a day (TID) | ORAL | 0 refills | Status: DC | PRN

## 2021-04-16 MED ORDER — PROMETHAZINE-DM 6.25-15 MG/5ML PO SYRP: 5.0000 mL | ORAL_SOLUTION | Freq: Every evening | ORAL | 0 refills | Status: DC | PRN

## 2021-04-16 MED ORDER — ACETAMINOPHEN 325 MG PO TABS
975.0000 mg | ORAL_TABLET | Freq: Once | ORAL | Status: AC
  Administered 2021-04-16: 975 mg via ORAL

## 2021-04-16 MED ORDER — ACETAMINOPHEN 325 MG PO TABS
ORAL_TABLET | ORAL | Status: AC
  Filled 2021-04-16: qty 2

## 2021-04-16 NOTE — ED Triage Notes (Signed)
Cough and sore throat started Friday. Reports fever today.

## 2021-04-16 NOTE — ED Triage Notes (Signed)
Home covid test negative today.

## 2021-04-16 NOTE — ED Provider Notes (Signed)
Debra Atkinson   MRN: [id number] DOB: 1946/01/05  Subjective:   Debra Atkinson is a 75 y.o. female presenting for 3 day history of acute onset persistent bothersome dry hacking cough, throat pain, sinus drainage. Started having fevers today. No chest pain, shob, wheezing. Did an at home COVID test and was negative. No history of respiratory disorders. She is not a smoker.   No current facility-administered medications for this encounter.  Current Outpatient Medications:    benzonatate (TESSALON PERLES) 100 MG capsule, Take 1 capsule (100 mg total) by mouth 2 (two) times daily as needed for cough., Disp: 60 capsule, Rfl: 0   cetirizine (ZYRTEC) 10 MG tablet, Take 1 tablet (10 mg total) by mouth daily., Disp: 90 tablet, Rfl: 3   Cholecalciferol (CVS D3) 50 MCG (2000 UT) CAPS, Take by mouth. , Disp: , Rfl:    Cyanocobalamin (CVS VITAMIN B-12 PO), Take 2,500 tablets by mouth every other day., Disp: , Rfl:    diazepam (VALIUM) 5 MG tablet, Take 1 tablet (5 mg total) by mouth every 12 (twelve) hours as needed for anxiety., Disp: 10 tablet, Rfl: 2   fluocinonide cream (LIDEX) 0.62 %, Apply 1 application topically 2 (two) times daily., Disp: , Rfl:    ibuprofen (ADVIL,MOTRIN) 200 MG tablet, Take 200 mg by mouth every 6 (six) hours as needed., Disp: , Rfl:    Multiple Vitamins-Minerals (MULTIVITAMIN WITH MINERALS) tablet, Take 1 tablet by mouth daily., Disp: , Rfl:    Multiple Vitamins-Minerals (PRESERVISION AREDS 2 PO), Take by mouth., Disp: , Rfl:    predniSONE (STERAPRED UNI-PAK 21 TAB) 10 MG (21) TBPK tablet, Take by mouth daily. Take as directed., Disp: 21 tablet, Rfl: 0   rosuvastatin (CRESTOR) 40 MG tablet, Take 1 tablet by mouth once daily (Patient taking differently: 20 mg.), Disp: 90 tablet, Rfl: 3   tiZANidine (ZANAFLEX) 4 MG tablet, Take 1 tablet (4 mg total) by mouth every 6 (six) hours as needed for muscle spasms., Disp: 30 tablet, Rfl: 1   traMADol (ULTRAM) 50 MG  tablet, Take 1 tablet (50 mg total) by mouth every 6 (six) hours as needed., Disp: 30 tablet, Rfl: 0   triamcinolone (NASACORT AQ) 55 MCG/ACT AERO nasal inhaler, Place 2 sprays into the nose daily., Disp: 3 Inhaler, Rfl: 3   Allergies  Allergen Reactions   Methylprednisolone Itching    Past Medical History:  Diagnosis Date   Cerebrovascular disease 02/05/2019   Concussion '06, '11   Diabetes mellitus    diet management   Family history of colon cancer 02/28/2016   Fibromyalgia 10/08/2013   Hyperlipidemia    diet managed   Varicella      Past Surgical History:  Procedure Laterality Date   [place]   laparotomy    Family History  Problem Relation Age of Onset   Diabetes Mother    Heart disease Mother        CHF   Cancer Mother 77       colon cancer   COPD Mother    Heart disease Father        CAD/MI   Rheumatic fever Father    Diabetes Maternal Grandfather     Social History   Tobacco Use   Smoking status: Never   Smokeless tobacco: Never  Substance Use Topics   Alcohol use: No    Alcohol/week: 0.0 standard drinks   Drug  use: No    ROS   Objective:   Vitals: BP (!) 151/83   Pulse (!) 111   Temp (!) 101.3 F (38.5 C) (Oral)   Resp 16   SpO2 94%   Physical Exam Constitutional:      General: She is not in acute distress.    Appearance: Normal appearance. She is well-developed. She is not ill-appearing, toxic-appearing or diaphoretic.  HENT:     Head: Normocephalic and atraumatic.     Nose: Nose normal.     Mouth/Throat:     Mouth: Mucous membranes are moist.  Eyes:     Extraocular Movements: Extraocular movements intact.     Pupils: Pupils are equal, round, and reactive to light.  Cardiovascular:     Rate and Rhythm: Normal rate and regular rhythm.     Pulses: Normal pulses.     Heart sounds: Normal heart sounds. No murmur heard.   No friction rub. No gallop.  Pulmonary:     Effort:  Pulmonary effort is normal. No respiratory distress.     Breath sounds: Normal breath sounds. No stridor. No wheezing, rhonchi or rales.  Skin:    General: Skin is warm and dry.     Findings: No rash.  Neurological:     Mental Status: She is alert and oriented to person, place, and time.  Psychiatric:        Mood and Affect: Mood normal.        Behavior: Behavior normal.        Thought Content: Thought content normal.    DG Chest 2 View  Result Date: 04/16/2021 CLINICAL DATA:  Cough, fever EXAM: CHEST - 2 VIEW COMPARISON:  11/01/2020 FINDINGS: The heart size and mediastinal contours are within normal limits. Both lungs are clear. The visualized skeletal structures are unremarkable. IMPRESSION: No active cardiopulmonary disease. Electronically Signed   By: Rolm Baptise M.D.   On: 04/16/2021 18:55     Assessment and Plan :   PDMP not reviewed this encounter.  1. Viral URI with cough   2. Fever, unspecified    Will manage for viral illness such as viral URI, viral syndrome, viral rhinitis, COVID-19. Counseled patient on nature of COVID-19 including modes of transmission, diagnostic testing, management and supportive care.  Offered scripts for symptomatic relief. COVID 19 testing is pending. Counseled patient on potential for adverse effects with medications prescribed/recommended today, ER and return-to-clinic precautions discussed, patient verbalized understanding.     Jaynee Eagles, PA-C 04/17/21 1601

## 2021-04-16 NOTE — Discharge Instructions (Addendum)
We will notify you of your COVID-19 test results as they arrive and may take between 48-72 hours.  I encourage you to sign up for MyChart if you have not already done so as this can be the easiest way for [place] to communicate results to you online or through a phone app.  Generally, we only contact you if it is a positive COVID result.  In the meantime, if you develop worsening symptoms including fever, chest pain, shortness of breath despite our current treatment plan then please report to the emergency room as this may be a sign of worsening status from possible COVID-19 infection.  Otherwise, we will manage this as a viral syndrome. For sore throat or cough try using a honey-based tea. Use 3 teaspoons of honey with juice squeezed from half lemon. Place shaved pieces of ginger into 1/2-1 cup of water and warm over stove top. Then mix the ingredients and repeat every 4 hours as needed. Please take Tylenol 500mg -650mg  every 6 hours for aches and pains, fevers. Hydrate very well with at least 2 liters of water. Eat light meals such as soups to replenish electrolytes and soft fruits, veggies. Start an antihistamine like Zyrtec for postnasal drainage, sinus congestion.  You can take this together with pseudoephedrine (Sudafed) at a dose of 30 mg 2-3 times a day as needed for the same kind of congestion.

## 2021-04-17 ENCOUNTER — Telehealth: Payer: Self-pay | Admitting: Internal Medicine

## 2021-04-17 LAB — SARS CORONAVIRUS 2 (TAT 6-24 HRS): SARS Coronavirus 2: POSITIVE — AB

## 2021-04-17 NOTE — Telephone Encounter (Signed)
[facility] FYI 04/16/2021  Pt is having congestion and cough and HA (not severe) and sore throat onset Friday night. Neg for covid today. Denies close contact with covid in the last 2 wks. 101.2 fever orally now and she doesnt when fever started.  Advised to be seen within 4 hrs. Patient understood and choose to go to UC.

## 2021-04-19 ENCOUNTER — Other Ambulatory Visit: Payer: Self-pay | Admitting: Internal Medicine

## 2021-05-22 ENCOUNTER — Ambulatory Visit (INDEPENDENT_AMBULATORY_CARE_PROVIDER_SITE_OTHER): Payer: Medicare HMO | Admitting: Family Medicine

## 2021-05-22 ENCOUNTER — Encounter: Payer: Self-pay | Admitting: Family Medicine

## 2021-05-22 ENCOUNTER — Other Ambulatory Visit: Payer: Self-pay

## 2021-05-22 VITALS — BP 140/80 | HR 82 | Temp 98.3°F | Ht 62.0 in | Wt 202.0 lb

## 2021-05-22 DIAGNOSIS — J329 Chronic sinusitis, unspecified: Secondary | ICD-10-CM | POA: Diagnosis not present

## 2021-05-22 DIAGNOSIS — B9689 Other specified bacterial agents as the cause of diseases classified elsewhere: Secondary | ICD-10-CM | POA: Diagnosis not present

## 2021-05-22 DIAGNOSIS — R0981 Nasal congestion: Secondary | ICD-10-CM | POA: Diagnosis not present

## 2021-05-22 DIAGNOSIS — Z8616 Personal history of COVID-19: Secondary | ICD-10-CM

## 2021-05-22 MED ORDER — AMOXICILLIN-POT CLAVULANATE 875-125 MG PO TABS: 1.0000 | ORAL_TABLET | Freq: Two times a day (BID) | ORAL | 0 refills | Status: AC

## 2021-05-22 NOTE — Progress Notes (Signed)
Phone [phone] In person visit   Subjective:   Debra Atkinson is a 75 y.o. year old very pleasant female patient who presents for/with See problem oriented charting Chief Complaint  Patient presents with   sinus congestion   bilateral ear pain    Pt states she has pressure in her ears and they feel stopped up.    nasal drainage    Pt c/o thick beige mucous when blows nose.    This visit occurred during the SARS-CoV-2 public health emergency.  Safety protocols were in place, including screening questions prior to the visit, additional usage of staff PPE, and extensive cleaning of exam room while observing appropriate contact time as indicated for disinfecting solutions.   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   COVID-19 virus infection 07/15/2020   Chronic right-sided headache 11/28/2019   Acute hearing loss, left 11/28/2019   Anxiety 09/27/2019   Facial cellulitis 09/25/2019   Abdominal pain, right lower quadrant 09/07/2019   OAB (overactive bladder) 08/06/2019   Right sided sciatica 08/06/2019   Pain of left thumb 08/06/2019   Cerebrovascular disease 02/05/2019   Rash and nonspecific skin eruption 01/19/2019   Right knee pain 09/01/2018   Great toe pain, right 09/01/2018   Acute pharyngitis 02/24/2018   Splinter in skin 01/22/2018   Right serous otitis media 11/28/2017   Vertigo 11/28/2017   Bilateral leg pain 11/28/2017   Dysuria 07/06/2017   B12 deficiency 05/07/2017   Vitamin D deficiency 05/07/2017   Fatigue 02/13/2017   Lateral epicondylitis of left elbow 10/31/2016   Toe pain, left 10/31/2016   Posterior neck pain 10/31/2016   Degenerative arthritis of  knee, bilateral 07/30/2016   Thyroid nodule 06/27/2016   Pain in both knees 06/27/2016   Mass of right side of neck 04/29/2016   Dizziness 03/07/2016   Family history of colon cancer 02/28/2016   Constipation 02/28/2016   UTI (urinary tract infection) 07/29/2015   Cough 05/05/2015   BPV (benign positional vertigo) 11/09/2014   Atypical chest pain 10/08/2014   Upper airway cough syndrome 06/08/2014   Sinusitis, chronic 04/09/2014   Allergic conjunctivitis 04/09/2014   Urinary frequency 02/18/2014   Eustachian tube dysfunction 12/04/2013   Fibromyalgia 10/08/2013   Low back pain 09/18/2012   Allergic rhinitis 09/18/2012   Need for prophylactic vaccination and inoculation against influenza 04/28/2012   Encounter for well adult exam with abnormal findings 04/28/2012   Pre-diabetes 04/25/2011   Hyperlipidemia     Medications- reviewed and updated Current Outpatient Medications  Medication Sig Dispense Refill   amoxicillin-clavulanate (AUGMENTIN) 875-125 MG tablet Take 1 tablet by mouth 2 (two) times daily for 7 days. 14 tablet 0   benzonatate (TESSALON) 100 MG capsule Take 1-2 capsules (100-200 mg total) by mouth 3 (three) times daily as needed for cough. 60 capsule 0   Cholecalciferol (CVS D3) 50 MCG (2000 UT) CAPS Take by mouth.      Cyanocobalamin (CVS VITAMIN B-12 PO) Take 2,500 tablets by mouth every other day.     diazepam (VALIUM) 5 MG tablet Take 1 tablet (5 mg total) by mouth every 12 (twelve) hours as needed for anxiety. 10 tablet 2  EQ ALLERGY RELIEF, CETIRIZINE, 10 MG tablet Take 1 tablet by mouth once daily 90 tablet 0   fluocinonide cream (LIDEX) 9.48 % Apply 1 application topically 2 (two) times daily.     ibuprofen (ADVIL,MOTRIN) 200 MG tablet Take 200 mg by mouth every 6 (six) hours as needed.     Multiple Vitamins-Minerals (MULTIVITAMIN WITH MINERALS) tablet Take 1 tablet by mouth daily.     Multiple Vitamins-Minerals (PRESERVISION AREDS 2 PO) Take by mouth.      promethazine-dextromethorphan (PROMETHAZINE-DM) 6.25-15 MG/5ML syrup Take 5 mLs by mouth at bedtime as needed for cough. 100 mL 0   rosuvastatin (CRESTOR) 40 MG tablet Take 1 tablet by mouth once daily (Patient taking differently: 20 mg.) 90 tablet 3   tiZANidine (ZANAFLEX) 4 MG tablet Take 1 tablet (4 mg total) by mouth every 6 (six) hours as needed for muscle spasms. 30 tablet 1   traMADol (ULTRAM) 50 MG tablet Take 1 tablet (50 mg total) by mouth every 6 (six) hours as needed. 30 tablet 0   triamcinolone (NASACORT AQ) 55 MCG/ACT AERO nasal inhaler Place 2 sprays into the nose daily. 3 Inhaler 3   No current facility-administered medications for this visit.     Objective:  BP 140/80   Pulse 82   Temp 98.3 F (36.8 C)   Ht 5\' 2"  (1.575 m)   Wt 202 lb (91.6 kg)   SpO2 98%   BMI 36.95 kg/m  Gen: NAD, resting comfortably Tympanic membrane's normal bilaterally, pharynx with mild erythema/drainage, narrow nasal turbinates-swollen and erythematous with clear discharge today CV: RRR no murmurs rubs or gallops Lungs: CTAB no crackles, wheeze, rhonchi Abdomen: soft/nontender/nondistended/normal bowel sounds.  Ext: no edema Skin: warm, dry    Assessment and Plan   #Nasal congestion and pressure/cough/sore throat S:Pt was seen in urgent care on 04/16/2021 for an evaluation of URI symptoms. She reported she started experiencing a 3 day hx of acute onset persistent bothersome dry hacking cough, throat pain, and sinus drainage. Included to have fevers the day of visit. Denied any chest pains, shortness of breath or wheezing. Did at home covid testing and was negative and no hx of respiratory disorders and nonsmoker.  -CLINICAL DATA:  Cough, fever EXAM: CHEST - 2 VIEW COMPARISON:  11/01/2020 FINDINGS: The heart size and mediastinal contours are within normal limits. Both lungs are clear. The visualized skeletal structures are unremarkable. IMPRESSION: No active cardiopulmonary disease.  Electronically Signed   By: Rolm Baptise M.D.   On: 04/16/2021 18:55   -tessalon 100 mg as needed and promethazine-dextromethorphan 6.25-15 ml as needed -She did test positive for COVID at that time-she was not treated with antiviral   She reflects back and feels like has had symptoms perhaps even earlier in the fall- tends to get sinus infections and throat irritation with some allergies. Has mild sore throat as well as drainage she feels liek making her cough. Takes cetirizine plus nasacort but only intermittently.   She complains of intermittent ear discomfort, sore throat, drainage, blowing nose even with thin/clear or thick yellow discharge occasionally with blood (nasacort does increase risk).  Occasional shortness of breath but no wheezing (but this is more not being a mouth breather and nose getting stopped up) . No chest pain.   Has seen Dr. Melvyn Novas years ago for congestion issues- diagnosed with upper airway cough syndrome. Also has seen Dr. 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 Melvyn Novas presenting with persistent nasal congestion and cough after COVID approximately a month ago - Concern for bacterial sinusitis bacterial superinfection-we will treat with Augmentin for 7 days - May certainly have an element of allergies in addition to this-recommended restarting cetirizine and after 5 to 7 days could restart Nasacort - We discussed if not improving in the next 7 to 10 days to return to care with either me or Dr. [name] Reichmann ideally -No shortness of breath and we opted to hold off on chest x-ray but if cough fails to improve could certainly consider chest x-ray (no true shortness of breath but just that she does not perform breathing through her mouth  and having nasal congestion so having to do mouth breathing) -I certainly have seen some lingering cough/congestion after COVID as well and this could be contributing to symptoms as well  Recommended follow up: Return for as needed for new, worsening, persistent symptoms. Future Appointments  Date Time Provider [place]  08/10/2021  9:40 AM Biagio Borg, MD [id number] None    Lab/Order associations:   ICD-10-CM   1. History of COVID-19  Z86.16     2. Bacterial sinusitis  J32.9    B96.89     3. Nasal congestion  R09.81 amoxicillin-clavulanate (AUGMENTIN) 875-125 MG tablet     Meds ordered this encounter  Medications   amoxicillin-clavulanate (AUGMENTIN) 875-125 MG tablet    Sig: Take 1 tablet by mouth 2 (two) times daily for 7 days.    Dispense:  14 tablet    Refill:  0    Time Spent: 32 minutes of total time (3:36 PM- 4:08 PM) was spent on the date of the encounter performing the following actions: chart review prior to seeing the patient, obtaining history, performing a medically necessary exam, counseling on the treatment plan, placing orders, and documenting in our EHR.   I,Jada Bradford,acting as a scribe for Garret Reddish, MD.,have documented all relevant documentation on the behalf of Garret Reddish, MD,as directed by  Garret Reddish, MD while in the presence of Garret Reddish, MD.   I, Garret Reddish, MD, have reviewed all documentation for this visit. The documentation on 05/22/21 for the exam, diagnosis, procedures, and orders are all accurate and complete.   Return precautions advised.  Garret Reddish, MD

## 2021-05-22 NOTE — Patient Instructions (Addendum)
Please trial Augmentin twice daily for 7 days. (Please take with food) Please update [place] on any new, worsening or persistent symptoms.   Start cetirizine as well and take consistently for next 2-3 weeks. Could add in nasacort and take consistently for 2-3 weeks but wait at least 5 days after antibiotics are started.   Recommended follow up: As needed. If not improving, schedule a follow-up with either [place] or Dr.John.   I hope you feel better and please have a Happy Thanksgiving!

## 2021-05-23 ENCOUNTER — Encounter: Payer: Self-pay | Admitting: Internal Medicine

## 2021-05-23 DIAGNOSIS — Z1231 Encounter for screening mammogram for malignant neoplasm of breast: Secondary | ICD-10-CM | POA: Diagnosis not present

## 2021-05-29 DIAGNOSIS — M8588 Other specified disorders of bone density and structure, other site: Secondary | ICD-10-CM | POA: Diagnosis not present

## 2021-05-29 DIAGNOSIS — Z780 Asymptomatic menopausal state: Secondary | ICD-10-CM | POA: Diagnosis not present

## 2021-05-29 LAB — HM DEXA SCAN

## 2021-06-08 ENCOUNTER — Encounter: Payer: Self-pay | Admitting: Internal Medicine

## 2021-06-08 ENCOUNTER — Other Ambulatory Visit: Payer: Self-pay

## 2021-06-08 ENCOUNTER — Ambulatory Visit (INDEPENDENT_AMBULATORY_CARE_PROVIDER_SITE_OTHER): Payer: Medicare HMO | Admitting: Internal Medicine

## 2021-06-08 VITALS — BP 159/83 | HR 66 | Temp 98.4°F | Ht 62.0 in | Wt 202.0 lb

## 2021-06-08 DIAGNOSIS — R7303 Prediabetes: Secondary | ICD-10-CM | POA: Diagnosis not present

## 2021-06-08 DIAGNOSIS — R350 Frequency of micturition: Secondary | ICD-10-CM

## 2021-06-08 DIAGNOSIS — J309 Allergic rhinitis, unspecified: Secondary | ICD-10-CM

## 2021-06-08 DIAGNOSIS — R059 Cough, unspecified: Secondary | ICD-10-CM | POA: Diagnosis not present

## 2021-06-08 DIAGNOSIS — J069 Acute upper respiratory infection, unspecified: Secondary | ICD-10-CM

## 2021-06-08 LAB — URINALYSIS, ROUTINE W REFLEX MICROSCOPIC
Bilirubin Urine: NEGATIVE
Hgb urine dipstick: NEGATIVE
Ketones, ur: NEGATIVE
Nitrite: NEGATIVE
Specific Gravity, Urine: 1.025 (ref 1.000–1.030)
Total Protein, Urine: NEGATIVE
Urine Glucose: NEGATIVE
Urobilinogen, UA: 0.2 (ref 0.0–1.0)
pH: 6 (ref 5.0–8.0)

## 2021-06-08 NOTE — Patient Instructions (Signed)
Your Flu and Covid testing was negative today  Please continue all other medications as before, and refills have been done if requested.  Please have the pharmacy call with any other refills you may need.  Please continue your efforts at being more active, low cholesterol diet, and weight control.  Please keep your appointments with your specialists as you may have planned  Please go to the LAB at the blood drawing area for the tests to be done - just the urine testing today  You will be contacted by phone if any changes need to be made immediately.  Otherwise, you will receive a letter about your results with an explanation, but please check with MyChart first.  Please remember to sign up for MyChart if you have not done so, as this will be important to you in the future with finding out test results, communicating by private email, and scheduling acute appointments online when needed.

## 2021-06-08 NOTE — Progress Notes (Addendum)
Patient ID: Debra Atkinson, female   DOB: 12-May-1946, 75 y.o.   MRN: [id number]        Chief Complaint: follow up sinus symptoms, urinary frequency, hx of allergies       HPI:  Debra Atkinson is a 75 y.o. female here with 2-3 days acute onset fever, facial pain, pressure, headache, general weakness and malaise, and greenish d/c, with mild ST and cough, but pt denies chest pain, wheezing, increased sob or doe, orthopnea, PND, increased LE swelling, palpitations, dizziness or syncope.   Has "small sinuses" per ENT; did have reent augmentin short course aug 26.  Covid neg x 2 in last 2 days.  Pt is s/p covid infeciton jan and oct 2022, this does not seem similar.  Does have several wks ongoing nasal allergy symptoms with clearish congestion, itch and sneezing, without fever, pain, ST, cough, swelling or wheezing.  Also with c/o urinary frequency for 2 days but Denies urinary symptoms such as dysuria, urgency, flank pain, hematuria or n/v, fever, chills.       Wt Readings from Last 3 Encounters:  06/08/21 202 lb (91.6 kg)  05/22/21 202 lb (91.6 kg)  02/07/21 201 lb (91.2 kg)   BP Readings from Last 3 Encounters:  06/08/21 (!) 159/83  05/22/21 140/80  04/16/21 (!) 151/83         Past Medical History:  Diagnosis Date   Cerebrovascular disease 02/05/2019   Concussion '06, '11   Diabetes mellitus    diet management   Family history of colon cancer 02/28/2016   Fibromyalgia 10/08/2013   Hyperlipidemia    diet managed   Varicella    Past Surgical History:  Procedure Laterality Date   [place]   laparotomy    reports that she has never smoked. She has never used smokeless tobacco. She reports that she does not drink alcohol and does not use drugs. family history includes COPD in her mother; Cancer (age of onset: 7) in her mother; Diabetes in her maternal grandfather and mother; Heart disease in her father and mother; Rheumatic fever  in her father. Allergies  Allergen Reactions   Methylprednisolone Itching   Current Outpatient Medications on File Prior to Visit  Medication Sig Dispense Refill   benzonatate (TESSALON) 100 MG capsule Take 1-2 capsules (100-200 mg total) by mouth 3 (three) times daily as needed for cough. 60 capsule 0   Cholecalciferol (CVS D3) 50 MCG (2000 UT) CAPS Take by mouth.      Cyanocobalamin (CVS VITAMIN B-12 PO) Take 2,500 tablets by mouth every other day.     diazepam (VALIUM) 5 MG tablet Take 1 tablet (5 mg total) by mouth every 12 (twelve) hours as needed for anxiety. 10 tablet 2   EQ ALLERGY RELIEF, CETIRIZINE, 10 MG tablet Take 1 tablet by mouth once daily 90 tablet 0   fluocinonide cream (LIDEX) 5.36 % Apply 1 application topically 2 (two) times daily.     ibuprofen (ADVIL,MOTRIN) 200 MG tablet Take 200 mg by mouth every 6 (six) hours as needed.     Multiple Vitamins-Minerals (MULTIVITAMIN WITH MINERALS) tablet Take 1 tablet by mouth daily.     Multiple Vitamins-Minerals (PRESERVISION AREDS 2 PO) Take by mouth.     promethazine-dextromethorphan (PROMETHAZINE-DM) 6.25-15 MG/5ML syrup Take 5 mLs by mouth at bedtime as needed for cough. 100 mL 0   rosuvastatin (CRESTOR) 40 MG tablet Take 1 tablet by  mouth once daily (Patient taking differently: 20 mg.) 90 tablet 3   tiZANidine (ZANAFLEX) 4 MG tablet Take 1 tablet (4 mg total) by mouth every 6 (six) hours as needed for muscle spasms. 30 tablet 1   traMADol (ULTRAM) 50 MG tablet Take 1 tablet (50 mg total) by mouth every 6 (six) hours as needed. 30 tablet 0   triamcinolone (NASACORT AQ) 55 MCG/ACT AERO nasal inhaler Place 2 sprays into the nose daily. 3 Inhaler 3   No current facility-administered medications on file prior to visit.        ROS:  All others reviewed and negative.  Objective        PE:  BP (!) 159/83 (BP Location: Right Arm, Patient Position: Sitting, Cuff Size: Large)   Pulse 66   Temp 98.4 F (36.9 C) (Oral)   Ht 5\' 2"   (1.575 m)   Wt 202 lb (91.6 kg)   SpO2 96%   BMI 36.95 kg/m                 Constitutional: Pt appears in NAD, mild ill               HENT: Head: NCAT.                Right Ear: External ear normal.                 Left Ear: External ear normal. Bilat tm's with mild erythema.  Max sinus areas mild tender.  Pharynx with mild erythema, no exudate               Eyes: . Pupils are equal, round, and reactive to light. Conjunctivae and EOM are normal               Nose: without d/c or deformity               Neck: Neck supple. Gross normal ROM               Cardiovascular: Normal rate and regular rhythm.                 Pulmonary/Chest: Effort normal and breath sounds without rales or wheezing.                Abd:  Soft, NT, ND, + BS, no organomegaly               Neurological: Pt is alert. At baseline orientation, motor grossly intact               Skin: Skin is warm. No rashes, no other new lesions, LE edema - none               Psychiatric: Pt behavior is normal without agitation   Micro: none  Cardiac tracings I have personally interpreted today:  none  Pertinent Radiological findings (summarize): none   Lab Results  Component Value Date   WBC 6.5 08/02/2020   HGB 12.8 08/02/2020   HCT 38.9 08/02/2020   PLT 353.0 08/02/2020   GLUCOSE 107 (H) 01/30/2021   CHOL 166 01/30/2021   TRIG 163.0 (H) 01/30/2021   HDL 53.50 01/30/2021   LDLDIRECT 152.0 08/02/2020   LDLCALC 80 01/30/2021   ALT 20 01/30/2021   AST 16 01/30/2021   NA 141 01/30/2021   K 4.5 01/30/2021   CL 105 01/30/2021   CREATININE 0.79 01/30/2021   BUN 14 01/30/2021   CO2 30 01/30/2021   TSH  2.60 08/02/2020   INR 0.92 05/24/2011   HGBA1C 6.3 01/30/2021   MICROALBUR 0.9 10/26/2015   Flu /covid POCT testing today - neg  SARS Coronavirus 2 Ag Negative Negative    Assessment/Plan:  Debra Atkinson is a 75 y.o. White or Caucasian [1] female with  has a past medical history of Cerebrovascular disease (02/05/2019),  Concussion ('06, '11), Diabetes mellitus, Family history of colon cancer (02/28/2016), Fibromyalgia (10/08/2013), Hyperlipidemia, and Varicella.  Pre-diabetes Lab Results  Component Value Date   HGBA1C 6.3 01/30/2021   Stable, pt to continue current medical treatment  - diet   Allergic rhinitis Ok for add nasacort asd,  to f/u any worsening symptoms or concerns  URI (upper respiratory infection) Mild to mod, for antibx course,  to f/u any worsening symptoms or concerns  Urinary frequency Also for UA and cx today,  to f/u any worsening symptoms or concerns  Followup: Return if symptoms worsen or fail to improve.  Cathlean Cower, MD 06/19/2021 11:45 AM [facility]

## 2021-06-09 ENCOUNTER — Encounter: Payer: Self-pay | Admitting: Internal Medicine

## 2021-06-09 LAB — URINE CULTURE

## 2021-06-13 ENCOUNTER — Encounter: Payer: Self-pay | Admitting: Internal Medicine

## 2021-06-13 NOTE — Assessment & Plan Note (Signed)
Lab Results  Component Value Date   HGBA1C 6.3 01/30/2021   Stable, pt to continue current medical treatment  - diet

## 2021-06-13 NOTE — Assessment & Plan Note (Signed)
Also for UA and cx today,  to f/u any worsening symptoms or concerns

## 2021-06-13 NOTE — Assessment & Plan Note (Signed)
Mild to mod, for antibx course,  to f/u any worsening symptoms or concerns 

## 2021-06-13 NOTE — Assessment & Plan Note (Signed)
[place] for add nasacort asd,  to f/u any worsening symptoms or concerns

## 2021-06-19 LAB — POC COVID19 BINAXNOW: SARS Coronavirus 2 Ag: NEGATIVE

## 2021-06-19 LAB — POCT INFLUENZA A/B
Influenza A, POC: NEGATIVE
Influenza B, POC: NEGATIVE

## 2021-07-17 ENCOUNTER — Encounter: Payer: Self-pay | Admitting: Internal Medicine

## 2021-07-17 ENCOUNTER — Ambulatory Visit (INDEPENDENT_AMBULATORY_CARE_PROVIDER_SITE_OTHER): Payer: Medicare HMO | Admitting: Internal Medicine

## 2021-07-17 ENCOUNTER — Other Ambulatory Visit: Payer: Self-pay

## 2021-07-17 VITALS — BP 142/80 | HR 83 | Temp 99.4°F | Ht 62.0 in | Wt 202.0 lb

## 2021-07-17 DIAGNOSIS — E041 Nontoxic single thyroid nodule: Secondary | ICD-10-CM

## 2021-07-17 DIAGNOSIS — E7800 Pure hypercholesterolemia, unspecified: Secondary | ICD-10-CM | POA: Diagnosis not present

## 2021-07-17 DIAGNOSIS — E559 Vitamin D deficiency, unspecified: Secondary | ICD-10-CM

## 2021-07-17 DIAGNOSIS — R7303 Prediabetes: Secondary | ICD-10-CM

## 2021-07-17 DIAGNOSIS — E538 Deficiency of other specified B group vitamins: Secondary | ICD-10-CM | POA: Diagnosis not present

## 2021-07-17 DIAGNOSIS — R21 Rash and other nonspecific skin eruption: Secondary | ICD-10-CM | POA: Diagnosis not present

## 2021-07-17 LAB — LIPID PANEL
Cholesterol: 156 mg/dL (ref 0–200)
HDL: 53.6 mg/dL (ref >39.00)
NonHDL: 102.88
Total CHOL/HDL Ratio: 3
Triglycerides: 208 mg/dL — ABNORMAL HIGH (ref 0.0–149.0)
VLDL: 41.6 mg/dL — ABNORMAL HIGH (ref 0.0–40.0)

## 2021-07-17 LAB — URINALYSIS, ROUTINE W REFLEX MICROSCOPIC
Bilirubin Urine: NEGATIVE
Hgb urine dipstick: NEGATIVE
Ketones, ur: NEGATIVE
Nitrite: NEGATIVE
Specific Gravity, Urine: 1.02 (ref 1.000–1.030)
Total Protein, Urine: NEGATIVE
Urine Glucose: NEGATIVE
Urobilinogen, UA: 0.2 (ref 0.0–1.0)
pH: 6 (ref 5.0–8.0)

## 2021-07-17 LAB — CBC WITH DIFFERENTIAL/PLATELET
Basophils Absolute: 0.1 10*3/uL (ref 0.0–0.1)
Basophils Relative: 0.6 % (ref 0.0–3.0)
Eosinophils Absolute: 0.1 10*3/uL (ref 0.0–0.7)
Eosinophils Relative: 1 % (ref 0.0–5.0)
HCT: 41.1 % (ref 36.0–46.0)
Hemoglobin: 12.9 g/dL (ref 12.0–15.0)
Lymphocytes Relative: 42.7 % (ref 12.0–46.0)
Lymphs Abs: 4 10*3/uL (ref 0.7–4.0)
MCHC: 31.4 g/dL (ref 30.0–36.0)
MCV: 83.5 fl (ref 78.0–100.0)
Monocytes Absolute: 0.8 10*3/uL (ref 0.1–1.0)
Monocytes Relative: 8.4 % (ref 3.0–12.0)
Neutro Abs: 4.5 10*3/uL (ref 1.4–7.7)
Neutrophils Relative %: 47.3 % (ref 43.0–77.0)
Platelets: 324 10*3/uL (ref 150.0–400.0)
RBC: 4.92 Mil/uL (ref 3.87–5.11)
RDW: 14.2 % (ref 11.5–15.5)
WBC: 9.5 10*3/uL (ref 4.0–10.5)

## 2021-07-17 LAB — BASIC METABOLIC PANEL
BUN: 17 mg/dL (ref 6–23)
CO2: 30 mEq/L (ref 19–32)
Calcium: 9.8 mg/dL (ref 8.4–10.5)
Chloride: 103 mEq/L (ref 96–112)
Creatinine, Ser: 0.78 mg/dL (ref 0.40–1.20)
GFR: 74.31 mL/min (ref >60.00)
Glucose, Bld: 109 mg/dL — ABNORMAL HIGH (ref 70–99)
Potassium: 3.9 mEq/L (ref 3.5–5.1)
Sodium: 140 mEq/L (ref 135–145)

## 2021-07-17 LAB — HEPATIC FUNCTION PANEL
ALT: 25 U/L (ref 0–35)
AST: 20 U/L (ref 0–37)
Albumin: 4.2 g/dL (ref 3.5–5.2)
Alkaline Phosphatase: 86 U/L (ref 39–117)
Bilirubin, Direct: 0 mg/dL (ref 0.0–0.3)
Total Bilirubin: 0.3 mg/dL (ref 0.2–1.2)
Total Protein: 7.3 g/dL (ref 6.0–8.3)

## 2021-07-17 LAB — VITAMIN D 25 HYDROXY (VIT D DEFICIENCY, FRACTURES): VITD: 40.95 ng/mL (ref 30.00–100.00)

## 2021-07-17 LAB — VITAMIN B12: Vitamin B-12: 812 pg/mL (ref 211–911)

## 2021-07-17 LAB — TSH: TSH: 2.16 u[IU]/mL (ref 0.35–5.50)

## 2021-07-17 LAB — HEMOGLOBIN A1C: Hgb A1c MFr Bld: 6.4 % (ref 4.6–6.5)

## 2021-07-17 MED ORDER — TRIAMCINOLONE ACETONIDE 0.1 % EX CREA: 1.0000 "application " | TOPICAL_CREAM | Freq: Two times a day (BID) | CUTANEOUS | 1 refills | Status: AC | PRN

## 2021-07-17 NOTE — Progress Notes (Signed)
Patient ID: Debra Atkinson, female   DOB: [birth year], 76 y.o.   MRN: [id number]        Chief Complaint: follow up enlarging thyroid nodule, htn, rash       HPI:  Debra Atkinson is a 76 y.o. female here with c/o noticeable papable enlarging right thyroid nodule, and asking for f/u u/s.  Denies hyper or hypo thyroid symptoms such as voice, skin or hair change  Pt denies chest pain, increased sob or doe, wheezing, orthopnea, PND, increased LE swelling, palpitations, dizziness or syncope. Pt mild nervous today, BP at home < 140/90 per pt.  Also has 1 wk onset itchy red bumps to right lower lateral back, and post right tricep are for unclear reason, mild, constant, itche and scratching, no hx of this in past.   Pt denies fever, wt loss, night sweats, loss of appetite, or other constitutional symptoms        Wt Readings from Last 3 Encounters:  07/17/21 202 lb (91.6 kg)  06/08/21 202 lb (91.6 kg)  05/22/21 202 lb (91.6 kg)   BP Readings from Last 3 Encounters:  07/17/21 (!) 142/80  06/08/21 (!) 159/83  05/22/21 140/80         Past Medical History:  Diagnosis Date   Cerebrovascular disease 02/05/2019   Concussion '06, '11   Diabetes mellitus    diet management   Family history of colon cancer 02/28/2016   Fibromyalgia 10/08/2013   Hyperlipidemia    diet managed   Varicella    Past Surgical History:  Procedure Laterality Date   [place]   laparotomy    reports that she has never smoked. She has never used smokeless tobacco. She reports that she does not drink alcohol and does not use drugs. family history includes COPD in her mother; Cancer (age of onset: 83) in her mother; Diabetes in her maternal grandfather and mother; Heart disease in her father and mother; Rheumatic fever in her father. Allergies  Allergen Reactions   Methylprednisolone Itching   Current Outpatient Medications on File Prior to Visit  Medication Sig  Dispense Refill   Cholecalciferol (CVS D3) 50 MCG (2000 UT) CAPS Take by mouth.      Cyanocobalamin (CVS VITAMIN B-12 PO) Take 2,500 tablets by mouth every other day.     diazepam (VALIUM) 5 MG tablet Take 1 tablet (5 mg total) by mouth every 12 (twelve) hours as needed for anxiety. 10 tablet 2   EQ ALLERGY RELIEF, CETIRIZINE, 10 MG tablet Take 1 tablet by mouth once daily 90 tablet 0   fluocinonide cream (LIDEX) 0.76 % Apply 1 application topically 2 (two) times daily.     ibuprofen (ADVIL,MOTRIN) 200 MG tablet Take 200 mg by mouth every 6 (six) hours as needed.     Multiple Vitamins-Minerals (MULTIVITAMIN WITH MINERALS) tablet Take 1 tablet by mouth daily.     Multiple Vitamins-Minerals (PRESERVISION AREDS 2 PO) Take by mouth.     rosuvastatin (CRESTOR) 40 MG tablet Take 1 tablet by mouth once daily (Patient taking differently: 20 mg.) 90 tablet 3   tiZANidine (ZANAFLEX) 4 MG tablet Take 1 tablet (4 mg total) by mouth every 6 (six) hours as needed for muscle spasms. 30 tablet 1   traMADol (ULTRAM) 50 MG tablet Take 1 tablet (50 mg total) by mouth every 6 (six) hours as needed. 30 tablet 0   triamcinolone (NASACORT AQ) 55 MCG/ACT  AERO nasal inhaler Place 2 sprays into the nose daily. 3 Inhaler 3   benzonatate (TESSALON) 100 MG capsule Take 1-2 capsules (100-200 mg total) by mouth 3 (three) times daily as needed for cough. (Patient not taking: Reported on 07/17/2021) 60 capsule 0   promethazine-dextromethorphan (PROMETHAZINE-DM) 6.25-15 MG/5ML syrup Take 5 mLs by mouth at bedtime as needed for cough. (Patient not taking: Reported on 07/17/2021) 100 mL 0   No current facility-administered medications on file prior to visit.        ROS:  All others reviewed and negative.  Objective        PE:  BP (!) 142/80 (BP Location: Right Arm, Patient Position: Sitting, Cuff Size: Large)    Pulse 83    Temp 99.4 F (37.4 C) (Oral)    Ht 5\' 2"  (1.575 m)    Wt 202 lb (91.6 kg)    SpO2 97%    BMI 36.95 kg/m                  Constitutional: Pt appears in NAD               HENT: Head: NCAT.                Right Ear: External ear normal.                 Left Ear: External ear normal.                Eyes: . Pupils are equal, round, and reactive to light. Conjunctivae and EOM are normal               Nose: without d/c or deformity               Neck: Neck supple. Gross normal ROM               Cardiovascular: Normal rate and regular rhythm.                 Pulmonary/Chest: Effort normal and breath sounds without rales or wheezing.                Abd:  Soft, NT, ND, + BS, no organomegaly               Neurological: Pt is alert. At baseline orientation, motor grossly intact               Skin:  LE edema - none, right lower back and post right arm tricep area with faint erythem rash small numerous nontender with excoriations               Psychiatric: Pt behavior is normal without agitation , mild nervous  Micro: none  Cardiac tracings I have personally interpreted today:  none  Pertinent Radiological findings (summarize): none   Lab Results  Component Value Date   WBC 9.5 07/17/2021   HGB 12.9 07/17/2021   HCT 41.1 07/17/2021   PLT 324.0 07/17/2021   GLUCOSE 109 (H) 07/17/2021   CHOL 156 07/17/2021   TRIG 208.0 (H) 07/17/2021   HDL 53.60 07/17/2021   LDLDIRECT 80.0 07/17/2021   LDLCALC 80 01/30/2021   ALT 25 07/17/2021   AST 20 07/17/2021   NA 140 07/17/2021   K 3.9 07/17/2021   CL 103 07/17/2021   CREATININE 0.78 07/17/2021   BUN 17 07/17/2021   CO2 30 07/17/2021   TSH 2.16 07/17/2021   INR 0.92 05/24/2011   HGBA1C 6.4  07/17/2021   MICROALBUR 0.9 10/26/2015   Assessment/Plan:  Debra Atkinson is a 76 y.o. White or Caucasian [1] female with  has a past medical history of Cerebrovascular disease (02/05/2019), Concussion ('06, '11), Diabetes mellitus, Family history of colon cancer (02/28/2016), Fibromyalgia (10/08/2013), Hyperlipidemia, and Varicella.  B12 deficiency Lab Results   Component Value Date   VITAMINB12 812 07/17/2021   Stable, cont oral replacement - b12 1000 mcg qd   Hyperlipidemia Lab Results  Component Value Date   LDLCALC 80 01/30/2021   Mild uncontrolled, goal ldl < 70, pt to continue current statin crestor 40, declines add zetia   Pre-diabetes Lab Results  Component Value Date   HGBA1C 6.4 07/17/2021   Stable, pt to continue current medical treatment  - diet   Thyroid nodule ? Enlarging  - for f/u thyroid u/s  Vitamin D deficiency Last vitamin D Lab Results  Component Value Date   VD25OH 40.95 07/17/2021   Stable, cont oral replacement   Rash and nonspecific skin eruption New onset, mild, etiology unclear - for trial triam cr prn Followup: No follow-ups on file.  Cathlean Cower, MD 07/22/2021 6:17 PM [facility]

## 2021-07-17 NOTE — Patient Instructions (Signed)
Please take all new medication as prescribed - the cream  You will be contacted regarding the referral for: thyroid ultrasound  Please be sure to follow your BP closely at home, with goal to be less than 140/90  Please continue all other medications as before, and refills have been done if requested.  Please have the pharmacy call with any other refills you may need.  Please continue your efforts at being more active, low cholesterol diet, and weight control.  You are otherwise up to date with prevention measures today.  Please keep your appointments with your specialists as you may have planned  [facility] to have the lab testing done today for your next visit  Please make an Appointment to return Aug 10, 2021, or sooner if needed

## 2021-07-18 ENCOUNTER — Encounter: Payer: Self-pay | Admitting: Internal Medicine

## 2021-07-18 LAB — LDL CHOLESTEROL, DIRECT: Direct LDL: 80 mg/dL

## 2021-07-22 NOTE — Assessment & Plan Note (Signed)
Lab Results  Component Value Date   LDLCALC 80 01/30/2021   Mild uncontrolled, goal ldl < 70, pt to continue current statin crestor 40, declines add zetia

## 2021-07-22 NOTE — Assessment & Plan Note (Signed)
Last vitamin D Lab Results  Component Value Date   VD25OH 40.95 07/17/2021   Stable, cont oral replacement  

## 2021-07-22 NOTE — Assessment & Plan Note (Signed)
?   Enlarging  - for f/u thyroid u/s

## 2021-07-22 NOTE — Assessment & Plan Note (Signed)
Lab Results  Component Value Date   VITAMINB12 812 07/17/2021   Stable, cont oral replacement - b12 1000 mcg qd 

## 2021-07-22 NOTE — Assessment & Plan Note (Signed)
New onset, mild, etiology unclear - for trial triam cr prn

## 2021-07-22 NOTE — Assessment & Plan Note (Signed)
Lab Results  Component Value Date   HGBA1C 6.4 07/17/2021   Stable, pt to continue current medical treatment  - diet

## 2021-07-27 ENCOUNTER — Other Ambulatory Visit: Payer: Self-pay | Admitting: Internal Medicine

## 2021-07-27 ENCOUNTER — Ambulatory Visit
Admission: RE | Admit: 2021-07-27 | Discharge: 2021-07-27 | Disposition: A | Discharge status: Home / Self Care | Payer: Medicare HMO | Source: Ambulatory Visit | Attending: Internal Medicine | Admitting: Internal Medicine

## 2021-07-27 DIAGNOSIS — E041 Nontoxic single thyroid nodule: Secondary | ICD-10-CM

## 2021-07-27 DIAGNOSIS — E042 Nontoxic multinodular goiter: Secondary | ICD-10-CM | POA: Diagnosis not present

## 2021-07-28 ENCOUNTER — Ambulatory Visit
Admission: EM | Admit: 2021-07-28 | Discharge: 2021-07-28 | Payer: Medicare HMO | Attending: Emergency Medicine | Admitting: Emergency Medicine

## 2021-07-28 ENCOUNTER — Other Ambulatory Visit: Payer: Self-pay

## 2021-07-28 ENCOUNTER — Emergency Department (HOSPITAL_COMMUNITY): Payer: Medicare HMO

## 2021-07-28 ENCOUNTER — Encounter (HOSPITAL_COMMUNITY): Payer: Self-pay | Admitting: Emergency Medicine

## 2021-07-28 ENCOUNTER — Encounter: Payer: Self-pay | Admitting: Emergency Medicine

## 2021-07-28 ENCOUNTER — Emergency Department (HOSPITAL_COMMUNITY)
Admission: EM | Admit: 2021-07-28 | Discharge: 2021-07-28 | Disposition: A | Discharge status: Home / Self Care | Payer: Medicare HMO | Attending: Student | Admitting: Student

## 2021-07-28 DIAGNOSIS — R109 Unspecified abdominal pain: Secondary | ICD-10-CM | POA: Diagnosis not present

## 2021-07-28 DIAGNOSIS — R0689 Other abnormalities of breathing: Secondary | ICD-10-CM | POA: Diagnosis not present

## 2021-07-28 DIAGNOSIS — R079 Chest pain, unspecified: Secondary | ICD-10-CM

## 2021-07-28 DIAGNOSIS — R072 Precordial pain: Secondary | ICD-10-CM | POA: Insufficient documentation

## 2021-07-28 DIAGNOSIS — R61 Generalized hyperhidrosis: Secondary | ICD-10-CM | POA: Insufficient documentation

## 2021-07-28 DIAGNOSIS — I499 Cardiac arrhythmia, unspecified: Secondary | ICD-10-CM | POA: Diagnosis not present

## 2021-07-28 DIAGNOSIS — D72829 Elevated white blood cell count, unspecified: Secondary | ICD-10-CM | POA: Diagnosis not present

## 2021-07-28 DIAGNOSIS — R0789 Other chest pain: Secondary | ICD-10-CM | POA: Diagnosis not present

## 2021-07-28 DIAGNOSIS — I10 Essential (primary) hypertension: Secondary | ICD-10-CM | POA: Diagnosis not present

## 2021-07-28 LAB — CBC WITH DIFFERENTIAL/PLATELET
Abs Immature Granulocytes: 0.09 10*3/uL — ABNORMAL HIGH (ref 0.00–0.07)
Basophils Absolute: 0.1 10*3/uL (ref 0.0–0.1)
Basophils Relative: 0 %
Eosinophils Absolute: 0 10*3/uL (ref 0.0–0.5)
Eosinophils Relative: 0 %
HCT: 43 % (ref 36.0–46.0)
Hemoglobin: 13.4 g/dL (ref 12.0–15.0)
Immature Granulocytes: 1 %
Lymphocytes Relative: 16 %
Lymphs Abs: 2.1 10*3/uL (ref 0.7–4.0)
MCH: 26.8 pg (ref 26.0–34.0)
MCHC: 31.2 g/dL (ref 30.0–36.0)
MCV: 86 fL (ref 80.0–100.0)
Monocytes Absolute: 1 10*3/uL (ref 0.1–1.0)
Monocytes Relative: 8 %
Neutro Abs: 10.2 10*3/uL — ABNORMAL HIGH (ref 1.7–7.7)
Neutrophils Relative %: 75 %
Platelets: 291 10*3/uL (ref 150–400)
RBC: 5 MIL/uL (ref 3.87–5.11)
RDW: 13.6 % (ref 11.5–15.5)
WBC: 13.5 10*3/uL — ABNORMAL HIGH (ref 4.0–10.5)
nRBC: 0 % (ref 0.0–0.2)

## 2021-07-28 LAB — COMPREHENSIVE METABOLIC PANEL
ALT: 26 U/L (ref 0–44)
AST: 20 U/L (ref 15–41)
Albumin: 4.3 g/dL (ref 3.5–5.0)
Alkaline Phosphatase: 92 U/L (ref 38–126)
Anion gap: 10 (ref 5–15)
BUN: 14 mg/dL (ref 8–23)
CO2: 26 mmol/L (ref 22–32)
Calcium: 9.2 mg/dL (ref 8.9–10.3)
Chloride: 100 mmol/L (ref 98–111)
Creatinine, Ser: 0.67 mg/dL (ref 0.44–1.00)
GFR, Estimated: >60 mL/min (ref >60)
Glucose, Bld: 125 mg/dL — ABNORMAL HIGH (ref 70–99)
Potassium: 3.7 mmol/L (ref 3.5–5.1)
Sodium: 136 mmol/L (ref 135–145)
Total Bilirubin: 0.6 mg/dL (ref 0.3–1.2)
Total Protein: 7.5 g/dL (ref 6.5–8.1)

## 2021-07-28 LAB — TROPONIN I (HIGH SENSITIVITY)
Troponin I (High Sensitivity): 3 ng/L (ref <18)
Troponin I (High Sensitivity): 3 ng/L (ref <18)

## 2021-07-28 LAB — LIPASE, BLOOD: Lipase: 34 U/L (ref 11–51)

## 2021-07-28 MED ORDER — KETOROLAC TROMETHAMINE 30 MG/ML IJ SOLN
30.0000 mg | Freq: Once | INTRAMUSCULAR | Status: AC
  Administered 2021-07-28: 30 mg via INTRAVENOUS
  Filled 2021-07-28: qty 1

## 2021-07-28 MED ORDER — ASPIRIN 81 MG PO CHEW
324.0000 mg | CHEWABLE_TABLET | Freq: Once | ORAL | Status: AC
  Administered 2021-07-28: 324 mg via ORAL

## 2021-07-28 MED ORDER — NAPROXEN 500 MG PO TABS: 500.0000 mg | ORAL_TABLET | Freq: Two times a day (BID) | ORAL | 0 refills | Status: DC

## 2021-07-28 MED ORDER — LIDOCAINE 5 % EX PTCH: 1.0000 | MEDICATED_PATCH | CUTANEOUS | 0 refills | Status: DC

## 2021-07-28 NOTE — ED Triage Notes (Signed)
Chest pain that started this morning in mid chest area that radiates to her back.

## 2021-07-28 NOTE — ED Notes (Signed)
Patient continues to c/o pain at center of chest radiating to back.  [facility] was called for transport.

## 2021-07-28 NOTE — ED Provider Notes (Signed)
Care assumed from previous provider, plan to follow-up on delta troponin, if negative DC home, previous provider without musculoskeletal etiology of pain Physical Exam  BP 131/73    Pulse 79    Temp 99.2 F (37.3 C)    Resp (!) 21    Ht 5\' 2"  (1.575 m)    Wt 90.7 kg    SpO2 96%    BMI 36.58 kg/m   Physical Exam Vitals and nursing note reviewed.  Constitutional:      General: She is not in acute distress.    Appearance: She is well-developed. She is not ill-appearing.  HENT:     Head: Atraumatic.  Eyes:     Pupils: Pupils are equal, round, and reactive to light.  Cardiovascular:     Rate and Rhythm: Normal rate.  Pulmonary:     Effort: No respiratory distress.  Abdominal:     General: There is no distension.  Musculoskeletal:        General: Normal range of motion.     Cervical back: Normal range of motion.  Skin:    General: Skin is warm and dry.  Neurological:     General: No focal deficit present.     Mental Status: She is alert.  Psychiatric:        Mood and Affect: Mood normal.    Procedures  Procedures Labs Reviewed  COMPREHENSIVE METABOLIC PANEL - Abnormal; Notable for the following components:      Result Value   Glucose, Bld 125 (*)    All other components within normal limits  CBC WITH DIFFERENTIAL/PLATELET - Abnormal; Notable for the following components:   WBC 13.5 (*)    Neutro Abs 10.2 (*)    Abs Immature Granulocytes 0.09 (*)    All other components within normal limits  LIPASE, BLOOD  TROPONIN I (HIGH SENSITIVITY)  TROPONIN I (HIGH SENSITIVITY)   DG Chest Port 1 View  Result Date: 07/28/2021 CLINICAL DATA:  cp EXAM: PORTABLE CHEST 1 VIEW COMPARISON:  Chest x-ray 04/16/2021, CT chest 05/24/2011 FINDINGS: The heart and mediastinal contours are unchanged. Aortic calcification. Biapical pleural/pulmonary scarring. No focal consolidation. No pulmonary edema. No pleural effusion. No pneumothorax. No acute osseous abnormality. IMPRESSION: 1. No active  disease. 2.  Aortic Atherosclerosis (ICD10-I70.0). Electronically Signed   By: Iven Finn M.D.   On: 07/28/2021 17:49   US THYROID  Result Date: 07/27/2021 CLINICAL DATA:  Prior ultrasound follow-up. EXAM: THYROID ULTRASOUND TECHNIQUE: Ultrasound examination of the thyroid gland and adjacent soft tissues was performed. COMPARISON:  December 2017, May 2019 FINDINGS: Parenchymal Echotexture: Moderately heterogenous Isthmus: 1.0 cm Right lobe: 5.0 x 2.2 x 1.5 cm Left lobe: 5.2 x 1.9 x 1.8 cm _________________________________________________________ Estimated total number of nodules >/= 1 cm: 2 Number of spongiform nodules >/=  2 cm not described below (TR1): 0 Number of mixed cystic and solid nodules >/= 1.5 cm not described below (TR2): 0 _________________________________________________________ Nodule labeled 1 is a solid isoechoic nodule (TR 3) in the thyroid isthmus measuring 2.0 x 1.6 x 1.5 cm, previously measuring up to 1.8 cm. *Given size (>/= 1.5 - 2.4 cm) and appearance, a follow-up ultrasound in 1 year should be considered based on TI-RADS criteria. Nodule labeled 2 is a solid isoechoic nodule (TR 3) in the superior right thyroid lobe measuring 2.4 x 2.3 x 2.0 cm, previously 1.9 cm in 2019 and 1.7 cm 2017. Given interval growth, biopsy of this nodule is now recommended. Nodule labeled 3 is a small  subcentimeter nodule which does not meet criteria for further dedicated follow-up or biopsy. IMPRESSION: 1. Borderline enlarged multinodular thyroid gland. 2. Nodule labeled 2 in the superior right thyroid lobe demonstrates interval growth now measuring 2.4 cm (previously 1.7 cm in 2017), and now meets criteria for biopsy. 3. Nodule labeled 1 in the thyroid isthmus continues to meet criteria for follow-up ultrasound in 1 year. This exam marks 3.5 year stability since it was first described in May 2019. Follow-up interval of 5 years is recommended. The above is in keeping with the ACR TI-RADS recommendations  - J Am Coll Radiol 2017;14:587-595. Electronically Signed   By: Albin Felling M.D.   On: 07/27/2021 13:22    ED Course / MDM   Clinical Course as of 07/28/21 2128  Fri Jul 28, 2021  1910 FU on repeat trop, pain reproducible on palpation. Suspect  MSK. Dc home if repeat trop neg [BH]    Clinical Course User Index [BH] Samah Lapiana A, PA-C   Patient reassessed.  Pain improved with Toradol.  Delta troponin flat, low suspicion for acute ACS, PE, dissection, DC home with symptomatic management.  Medical Decision Making Amount and/or Complexity of Data Reviewed Independent Historian: spouse External Data Reviewed: labs, radiology, ECG and notes. Labs: ordered. Decision-making details documented in ED Course. Radiology: ordered and independent interpretation performed. Decision-making details documented in ED Course. ECG/medicine tests: ordered and independent interpretation performed. Decision-making details documented in ED Course.  Risk Prescription drug management.          Colbie Danner A, PA-C 07/28/21 2129    Teressa Lower, MD 07/29/21 [phone]

## 2021-07-28 NOTE — ED Triage Notes (Signed)
Pt arrived via [facility] c/o substernal chest pain x 1 day. Pain radiates through her back to her shoulders.

## 2021-07-28 NOTE — ED Notes (Signed)
Patient is being discharged from the Urgent Care and sent to the Emergency Department via [facility] . Per Dr. Alphonzo Cruise, patient is in need of higher level of care due to chest pain. Patient is aware and verbalizes understanding of plan of care.  Vitals:   07/28/21 1447 07/28/21 1542  BP: (!) 180/83 (!) 172/81  Pulse: 84 87  Resp: 18 18  Temp: 99.2 F (37.3 C)   SpO2: 95% 95%

## 2021-07-28 NOTE — ED Notes (Signed)
[facility] here to transport pt.

## 2021-07-28 NOTE — ED Provider Notes (Addendum)
HPI  SUBJECTIVE:  Debra Atkinson is a 76 y.o. female who presents with the acute onset of sharp, constant substernal chest pain radiating through to her back and to her shoulders.  She describes it as heaviness, burning, worse with exertion and bending forward.  She also reports increased belching.  She denies chest pressure, nausea, diaphoresis, radiation of this pain up her neck, down her arm.  No ripping or tearing pain.  No waterbrash, coughing, wheezing or shortness of breath, abdominal pain, palpitations, lightheadedness, syncope.  It is not associated with arm movement or torso rotation.  No recent change in her physical activity, although she states that she leaned over the corner of a washing machine yesterday and thinks that she may have hurt a right lower rib/right upper quadrant.  She has not tried anything for this.  Symptoms are better with resting, worse with exertion, bending forward, getting up and down and with deep inspiration.  She has never had symptoms like this before.  She has a past medical history of hypertension, diet-controlled diabetes, hypercholesterolemia, fibromyalgia.  No history of coronary artery disease, MI, CVA, PAD/PVD, smoking.  Family history negative for early MI.     Past Medical History:  Diagnosis Date   Cerebrovascular disease 02/05/2019   Concussion '06, '11   Diabetes mellitus    diet management   Family history of colon cancer 02/28/2016   Fibromyalgia 10/08/2013   Hyperlipidemia    diet managed   Varicella     Past Surgical History:  Procedure Laterality Date   [place]   laparotomy    Family History  Problem Relation Age of Onset   Diabetes Mother    Heart disease Mother        CHF   Cancer Mother 87       colon cancer   COPD Mother    Heart disease Father        CAD/MI   Rheumatic fever Father    Diabetes Maternal Grandfather     Social History   Tobacco Use    Smoking status: Never   Smokeless tobacco: Never  Substance Use Topics   Alcohol use: No    Alcohol/week: 0.0 standard drinks   Drug use: No    No current facility-administered medications for this encounter.  Current Outpatient Medications:    Cholecalciferol ([employer]S D3) 50 MCG (2000 UT) CAPS, Take by mouth. , Disp: , Rfl:    Cyanocobalamin (CVS VITAMIN B-12 PO), Take 2,500 tablets by mouth every other day., Disp: , Rfl:    diazepam (VALIUM) 5 MG tablet, Take 1 tablet (5 mg total) by mouth every 12 (twelve) hours as needed for anxiety., Disp: 10 tablet, Rfl: 2   EQ ALLERGY RELIEF, CETIRIZINE, 10 MG tablet, Take 1 tablet by mouth once daily, Disp: 90 tablet, Rfl: 0   fluocinonide cream (LIDEX) 2.95 %, Apply 1 application topically 2 (two) times daily., Disp: , Rfl:    ibuprofen (ADVIL,MOTRIN) 200 MG tablet, Take 200 mg by mouth every 6 (six) hours as needed., Disp: , Rfl:    Multiple Vitamins-Minerals (MULTIVITAMIN WITH MINERALS) tablet, Take 1 tablet by mouth daily., Disp: , Rfl:    Multiple Vitamins-Minerals (PRESERVISION AREDS 2 PO), Take by mouth., Disp: , Rfl:    promethazine-dextromethorphan (PROMETHAZINE-DM) 6.25-15 MG/5ML syrup, Take 5 mLs by mouth at bedtime as needed for cough. (Patient not taking: Reported on 07/17/2021), Disp: 100 mL, Rfl:  0   rosuvastatin (CRESTOR) 40 MG tablet, Take 1 tablet by mouth once daily (Patient taking differently: 20 mg.), Disp: 90 tablet, Rfl: 3   tiZANidine (ZANAFLEX) 4 MG tablet, Take 1 tablet (4 mg total) by mouth every 6 (six) hours as needed for muscle spasms., Disp: 30 tablet, Rfl: 1   traMADol (ULTRAM) 50 MG tablet, Take 1 tablet (50 mg total) by mouth every 6 (six) hours as needed., Disp: 30 tablet, Rfl: 0   triamcinolone (NASACORT AQ) 55 MCG/ACT AERO nasal inhaler, Place 2 sprays into the nose daily., Disp: 3 Inhaler, Rfl: 3   triamcinolone cream (KENALOG) 0.1 %, Apply 1 application topically 2 (two) times daily as needed., Disp: 30 g, Rfl:  1  Allergies  Allergen Reactions   Methylprednisolone Itching     ROS  As noted in HPI.   Physical Exam  BP (!) 172/81 (BP Location: Right Arm)    Pulse 87    Temp 99.2 F (37.3 C) (Oral)    Resp 18    SpO2 95%   Constitutional: Well developed, well nourished, appears uncomfortable Eyes:  EOMI, conjunctiva normal bilaterally HENT: Normocephalic, atraumatic,mucus membranes moist Respiratory: Normal inspiratory effort, lungs clear bilaterally.  Positive lower sternal and right lower rib tenderness.  Patient states this does not reproduce her chest pain.  No other chest wall tenderness.  RP 2+ and equal bilaterally Cardiovascular: Normal rate, regular rhythm, no murmurs, rubs, gallop GI: nondistended skin: No rash, skin intact Musculoskeletal: no deformities Neurologic: Alert & oriented x 3, no focal neuro deficits Psychiatric: Speech and behavior appropriate   ED Course   Medications  aspirin chewable tablet 324 mg (324 mg Oral Given 07/28/21 1542)    Orders Placed This Encounter  Procedures   EKG 12-Lead    Standing Status:   Standing    Number of Occurrences:   1   ED EKG    Standing Status:   Standing    Number of Occurrences:   1    Order Specific Question:   Reason for Exam    Answer:   Chest Pain   ED Clinical Impression  1. Chest pain, unspecified type      ED Assessment/Plan  EKG: Normal sinus rhythm, rate 88.  Normal axis, normal intervals.  No hypertrophy.  Cannot rule out inferior infarct per computer.  No acute ST T wave changes.  No change compared to EKG from 07/13/2020  Patient with chest pain. HEART score:  History: Slightly suspicious 0 EKG: Normal 0 Age: Above 65+2 Risk factors: More than 3 risk factors +2 Troponin: Not available  Total heart score: 4.  Feel that patient needs further evaluation to make sure that this is not her heart.  While she does have some chest tenderness, she states that the chest pain is not reproducible.  Giving  aspirin 324 mg here.  Sending to the ED via EMS for cardiac monitoring on route.  She drove herself here.  I do not think that she is safe to drive herself.  Gave report to EMS  Meds ordered this encounter  Medications   aspirin chewable tablet 324 mg      *This clinic note was created using [employer]. Therefore, there may be occasional mistakes despite careful proofreading.  ?    Melynda Ripple, MD 07/28/21 [place], Terrelle Ruffolo, MD 07/28/21 [place], MD 07/28/21 1601

## 2021-07-28 NOTE — ED Provider Notes (Signed)
[facility] EMERGENCY DEPARTMENT Provider Note   CSN: [id number] Arrival date & time: 07/28/21  1623     History  Chief Complaint  Patient presents with   Chest Pain    Debra Atkinson is a 76 y.o. female who presents to the ED from UC with complaint of gradual onset, constant, sharp, substernal chest pain that began this morning.  Patient reports history of cholecystectomy with right upper quadrant scar.  She states that she walked into a piece of furniture last night and hit her scar on the furniture.  She states that she was having some abdominal pain since that time.  She woke up this morning with substernal chest pain that has not gone away.  She denies any diaphoresis, shortness of breath, nausea, vomiting.  She states she went to urgent care who then sent her here for further evaluation.  She has not tried taking anything for her pain.  She does report the pain is worse with sitting upright.  Patient is a never smoker.  She does endorse family history of CAD.  She denies any history of DVT or PE.  No recent prolonged travel or immobilization.  No hemoptysis.  No active malignancy.  No exogenous hormone use.   The history is provided by the patient and medical records.      Home Medications Prior to Admission medications   Medication Sig Start Date End Date Taking? Authorizing Provider  Cholecalciferol (CVS D3) 50 MCG (2000 UT) CAPS Take by mouth.     [provider]  Cyanocobalamin (CVS VITAMIN B-12 PO) Take 2,500 tablets by mouth every other day.    [provider]  diazepam (VALIUM) 5 MG tablet Take 1 tablet (5 mg total) by mouth every 12 (twelve) hours as needed for anxiety. 05/07/19   Biagio Borg, MD  EQ ALLERGY RELIEF, CETIRIZINE, 10 MG tablet Take 1 tablet by mouth once daily 04/19/21   Biagio Borg, MD  fluocinonide cream (LIDEX) 0.97 % Apply 1 application topically 2 (two) times daily.    [provider]  ibuprofen (ADVIL,MOTRIN) 200 MG tablet  Take 200 mg by mouth every 6 (six) hours as needed.    [provider]  Multiple Vitamins-Minerals (MULTIVITAMIN WITH MINERALS) tablet Take 1 tablet by mouth daily.    [provider]  Multiple Vitamins-Minerals (PRESERVISION AREDS 2 PO) Take by mouth.    [provider]  promethazine-dextromethorphan (PROMETHAZINE-DM) 6.25-15 MG/5ML syrup Take 5 mLs by mouth at bedtime as needed for cough. Patient not taking: Reported on 07/17/2021 04/16/21   Jaynee Eagles, PA-C  rosuvastatin (CRESTOR) 40 MG tablet Take 1 tablet by mouth once daily Patient taking differently: 20 mg. 12/26/20   Biagio Borg, MD  tiZANidine (ZANAFLEX) 4 MG tablet Take 1 tablet (4 mg total) by mouth every 6 (six) hours as needed for muscle spasms. 10/31/16   Biagio Borg, MD  traMADol (ULTRAM) 50 MG tablet Take 1 tablet (50 mg total) by mouth every 6 (six) hours as needed. 12/07/20   Biagio Borg, MD  triamcinolone (NASACORT AQ) 55 MCG/ACT AERO nasal inhaler Place 2 sprays into the nose daily. 11/27/19   Biagio Borg, MD  triamcinolone cream (KENALOG) 0.1 % Apply 1 application topically 2 (two) times daily as needed. 07/17/21 07/17/22  Biagio Borg, MD      Allergies    Methylprednisolone    Review of Systems   Review of Systems  Constitutional:  Negative for chills,  diaphoresis and fever.  Respiratory:  Negative for cough and shortness of breath.   Cardiovascular:  Positive for chest pain.  Gastrointestinal:  Negative for diarrhea, nausea and vomiting.  All other systems reviewed and are negative.  Physical Exam Updated Vital Signs BP (!) 160/85    Pulse 82    Temp 99.2 F (37.3 C)    Resp 19    Ht 5\' 2"  (1.575 m)    Wt 90.7 kg    SpO2 97%    BMI 36.58 kg/m  Physical Exam Vitals and nursing note reviewed.  Constitutional:      Appearance: She is diaphoretic. She is not ill-appearing.  HENT:     Head: Normocephalic and atraumatic.  Eyes:     Conjunctiva/sclera: Conjunctivae normal.   Cardiovascular:     Rate and Rhythm: Normal rate and regular rhythm.     Pulses:          Radial pulses are 2+ on the right side and 2+ on the left side.     Heart sounds: Normal heart sounds.  Pulmonary:     Effort: Pulmonary effort is normal.     Breath sounds: Normal breath sounds. No decreased breath sounds, wheezing, rhonchi or rales.  Chest:     Chest wall: Tenderness present.  Abdominal:     Palpations: Abdomen is soft.     Tenderness: There is no abdominal tenderness.  Musculoskeletal:     Cervical back: Neck supple.     Right lower leg: No edema.     Left lower leg: No edema.  Skin:    General: Skin is warm.  Neurological:     Mental Status: She is alert.    ED Results / Procedures / Treatments   Labs (all labs ordered are listed, but only abnormal results are displayed) Labs Reviewed  COMPREHENSIVE METABOLIC PANEL - Abnormal; Notable for the following components:      Result Value   Glucose, Bld 125 (*)    All other components within normal limits  CBC WITH DIFFERENTIAL/PLATELET - Abnormal; Notable for the following components:   WBC 13.5 (*)    Neutro Abs 10.2 (*)    Abs Immature Granulocytes 0.09 (*)    All other components within normal limits  LIPASE, BLOOD  TROPONIN I (HIGH SENSITIVITY)  TROPONIN I (HIGH SENSITIVITY)    EKG None  Radiology DG Chest Port 1 View  Result Date: 07/28/2021 CLINICAL DATA:  cp EXAM: PORTABLE CHEST 1 VIEW COMPARISON:  Chest x-ray 04/16/2021, CT chest 05/24/2011 FINDINGS: The heart and mediastinal contours are unchanged. Aortic calcification. Biapical pleural/pulmonary scarring. No focal consolidation. No pulmonary edema. No pleural effusion. No pneumothorax. No acute osseous abnormality. IMPRESSION: 1. No active disease. 2.  Aortic Atherosclerosis (ICD10-I70.0). Electronically Signed   By: Iven Finn M.D.   On: 07/28/2021 17:49   US THYROID  Result Date: 07/27/2021 CLINICAL DATA:  Prior ultrasound follow-up. EXAM:  THYROID ULTRASOUND TECHNIQUE: Ultrasound examination of the thyroid gland and adjacent soft tissues was performed. COMPARISON:  December 2017, May 2019 FINDINGS: Parenchymal Echotexture: Moderately heterogenous Isthmus: 1.0 cm Right lobe: 5.0 x 2.2 x 1.5 cm Left lobe: 5.2 x 1.9 x 1.8 cm _________________________________________________________ Estimated total number of nodules >/= 1 cm: 2 Number of spongiform nodules >/=  2 cm not described below (TR1): 0 Number of mixed cystic and solid nodules >/= 1.5 cm not described below (TR2): 0 _________________________________________________________ Nodule labeled 1 is a solid isoechoic nodule (TR 3) in the thyroid isthmus  measuring 2.0 x 1.6 x 1.5 cm, previously measuring up to 1.8 cm. *Given size (>/= 1.5 - 2.4 cm) and appearance, a follow-up ultrasound in 1 year should be considered based on TI-RADS criteria. Nodule labeled 2 is a solid isoechoic nodule (TR 3) in the superior right thyroid lobe measuring 2.4 x 2.3 x 2.0 cm, previously 1.9 cm in 2019 and 1.7 cm 2017. Given interval growth, biopsy of this nodule is now recommended. Nodule labeled 3 is a small subcentimeter nodule which does not meet criteria for further dedicated follow-up or biopsy. IMPRESSION: 1. Borderline enlarged multinodular thyroid gland. 2. Nodule labeled 2 in the superior right thyroid lobe demonstrates interval growth now measuring 2.4 cm (previously 1.7 cm in 2017), and now meets criteria for biopsy. 3. Nodule labeled 1 in the thyroid isthmus continues to meet criteria for follow-up ultrasound in 1 year. This exam marks 3.5 year stability since it was first described in May 2019. Follow-up interval of 5 years is recommended. The above is in keeping with the ACR TI-RADS recommendations - J Am Coll Radiol 2017;14:587-595. Electronically Signed   By: Albin Felling M.D.   On: 07/27/2021 13:22    Procedures Procedures    Medications Ordered in ED Medications - No data to display  ED Course/  Medical Decision Making/ A&P Clinical Course as of 07/28/21 1910  Fri Jul 28, 2021  1910 FU on repeat trop, pain reproducible on palpation. Suspect  MSK. Dc home if repeat trop neg [BH]    Clinical Course User Index [BH] Henderly, Britni A, PA-C                           Medical Decision Making 76 year old female who presents to the ED today from urgent care with complaint of chest pain that started this morning.  No other associated symptoms.  On arrival to the ED vitals are stable.  EKG without acute ischemic changes.  On my exam she is tender to palpation throughout her chest wall.  Abdomen is soft and nontender.  Will work-up for ACS at this time however lower suspicion for same given chest wall tenderness palpation.  She denies any risk factors for PE at this time given she is nontachycardic and nonhypoxic I have lower suspicion for same.  Denies any recent viral illness to suggest myocarditis or pericarditis.  Lower suspicion for dissection.  Amount and/or Complexity of Data Reviewed Labs: ordered.    Details: CBC with mild leukocytosis 13,500. Pt without infectious type symptoms.  CMP with glucose 125. No other electrolyte abnormalities.  Troponin of 3. Given pain started today will repeat. Radiology: ordered.    Details: CXR clear ECG/medicine tests: ordered.    Details: EKG without acute ischemic changes   At shift change case signed out to [facility], PA-C, who will dispo patient accordingly.         Final Clinical Impression(s) / ED Diagnoses Final diagnoses:  None    Rx / DC Orders ED Discharge Orders     None         Eustaquio Maize, PA-C 07/28/21 1912    Kommor, Wilmot, MD 07/29/21 [phone]

## 2021-07-28 NOTE — Discharge Instructions (Signed)
Return for any new or worsening symptoms.

## 2021-08-01 DIAGNOSIS — H5203 Hypermetropia, bilateral: Secondary | ICD-10-CM | POA: Diagnosis not present

## 2021-08-02 ENCOUNTER — Encounter: Payer: Self-pay | Admitting: Internal Medicine

## 2021-08-02 ENCOUNTER — Other Ambulatory Visit: Payer: Self-pay

## 2021-08-02 ENCOUNTER — Ambulatory Visit (INDEPENDENT_AMBULATORY_CARE_PROVIDER_SITE_OTHER): Payer: Medicare HMO | Admitting: Internal Medicine

## 2021-08-02 VITALS — BP 132/78 | HR 85 | Temp 98.7°F | Ht 62.0 in | Wt 201.6 lb

## 2021-08-02 DIAGNOSIS — M79644 Pain in right finger(s): Secondary | ICD-10-CM

## 2021-08-02 DIAGNOSIS — J329 Chronic sinusitis, unspecified: Secondary | ICD-10-CM | POA: Diagnosis not present

## 2021-08-02 DIAGNOSIS — R21 Rash and other nonspecific skin eruption: Secondary | ICD-10-CM | POA: Diagnosis not present

## 2021-08-02 DIAGNOSIS — R079 Chest pain, unspecified: Secondary | ICD-10-CM | POA: Diagnosis not present

## 2021-08-02 MED ORDER — PREDNISONE 10 MG PO TABS: ORAL_TABLET | ORAL | 0 refills | Status: DC

## 2021-08-02 NOTE — Patient Instructions (Signed)
Please take all new medication as prescribed - the prednisone  Please continue all other medications as before, and refills have been done if requested.  Please have the pharmacy call with any other refills you may need.  Please keep your appointments with your specialists as you may have planned  You will be contacted regarding the referral for: ENT

## 2021-08-02 NOTE — Progress Notes (Signed)
Patient ID: Debra Atkinson, female   DOB: 1946/05/20, 76 y.o.   MRN: [id number]        Chief Complaint: follow up recent ED visit with chest pain       HPI:  Debra Atkinson is a 76 y.o. female here overall doing well with resolved CP that began at the right lower anterior chest/ribs after leaning on a wash machine, then later next day seemed to involve soreness of most of the anterior central chest wall;   currently Pt denies chest pain, increased sob or doe, wheezing, orthopnea, PND, increased LE swelling, palpitations, dizziness or syncope.  Still does have chronic pain to right > left upper back are no change, and also right index finger DIP pain and swelling.   Pt denies polydipsia, polyuria, or new focal neuro s/s.   Pt denies fever, wt loss, night sweats, loss of appetite, or other constitutional symptoms  Has chronic sinus symptoms and recurring pain, asks for ENT referral.  Also itchy red spots have recurred to torso similar to previous, sometimes leads to excoriatins with scratching, asks for repeat prednisone.   Wt Readings from Last 3 Encounters:  08/02/21 201 lb 9.6 oz (91.4 kg)  07/28/21 200 lb (90.7 kg)  07/17/21 202 lb (91.6 kg)   BP Readings from Last 3 Encounters:  08/02/21 132/78  07/28/21 132/68  07/28/21 (!) 172/81         Past Medical History:  Diagnosis Date   Cerebrovascular disease 02/05/2019   Concussion '06, '11   Diabetes mellitus    diet management   Family history of colon cancer 02/28/2016   Fibromyalgia 10/08/2013   Hyperlipidemia    diet managed   Varicella    Past Surgical History:  Procedure Laterality Date   [place]   laparotomy    reports that she has never smoked. She has never used smokeless tobacco. She reports that she does not drink alcohol and does not use drugs. family history includes COPD in her mother; Cancer (age of onset: 83) in her mother; Diabetes in her maternal  grandfather and mother; Heart disease in her father and mother; Rheumatic fever in her father. Allergies  Allergen Reactions   Methylprednisolone Itching   Current Outpatient Medications on File Prior to Visit  Medication Sig Dispense Refill   Cholecalciferol ([employer]S D3) 50 MCG (2000 UT) CAPS Take by mouth.      ibuprofen (ADVIL,MOTRIN) 200 MG tablet Take 200 mg by mouth every 6 (six) hours as needed.     Multiple Vitamins-Minerals (MULTIVITAMIN WITH MINERALS) tablet Take 1 tablet by mouth daily.     Multiple Vitamins-Minerals (PRESERVISION AREDS 2 PO) Take by mouth.     rosuvastatin (CRESTOR) 40 MG tablet Take 1 tablet by mouth once daily 90 tablet 3   Cyanocobalamin (CVS VITAMIN B-12 PO) Take 2,500 tablets by mouth every other day. (Patient not taking: Reported on 08/02/2021)     diazepam (VALIUM) 5 MG tablet Take 1 tablet (5 mg total) by mouth every 12 (twelve) hours as needed for anxiety. (Patient not taking: Reported on 08/02/2021) 10 tablet 2   EQ ALLERGY RELIEF, CETIRIZINE, 10 MG tablet Take 1 tablet by mouth once daily (Patient not taking: Reported on 08/02/2021) 90 tablet 0   fluocinonide cream (LIDEX) 1.30 % Apply 1 application topically 2 (two) times daily. (Patient not taking: Reported on 08/02/2021)     lidocaine (LIDODERM) 5 %  Place 1 patch onto the skin daily. Remove & Discard patch within 12 hours or as directed by MD (Patient not taking: Reported on 08/02/2021) 30 patch 0   naproxen (NAPROSYN) 500 MG tablet Take 1 tablet (500 mg total) by mouth 2 (two) times daily. (Patient not taking: Reported on 08/02/2021) 30 tablet 0   promethazine-dextromethorphan (PROMETHAZINE-DM) 6.25-15 MG/5ML syrup Take 5 mLs by mouth at bedtime as needed for cough. (Patient not taking: Reported on 07/17/2021) 100 mL 0   tiZANidine (ZANAFLEX) 4 MG tablet Take 1 tablet (4 mg total) by mouth every 6 (six) hours as needed for muscle spasms. (Patient not taking: Reported on 08/02/2021) 30 tablet 1   traMADol (ULTRAM) 50 MG  tablet Take 1 tablet (50 mg total) by mouth every 6 (six) hours as needed. (Patient not taking: Reported on 08/02/2021) 30 tablet 0   triamcinolone (NASACORT AQ) 55 MCG/ACT AERO nasal inhaler Place 2 sprays into the nose daily. (Patient not taking: Reported on 08/02/2021) 3 Inhaler 3   triamcinolone cream (KENALOG) 0.1 % Apply 1 application topically 2 (two) times daily as needed. (Patient not taking: Reported on 08/02/2021) 30 g 1   No current facility-administered medications on file prior to visit.        ROS:  All others reviewed and negative.  Objective        PE:  BP 132/78    Pulse 85    Temp 98.7 F (37.1 C) (Oral)    Ht 5\' 2"  (1.575 m)    Wt 201 lb 9.6 oz (91.4 kg)    SpO2 98%    BMI 36.87 kg/m                 Constitutional: Pt appears in NAD               HENT: Head: NCAT.                Right Ear: External ear normal.                 Left Ear: External ear normal.                Eyes: . Pupils are equal, round, and reactive to light. Conjunctivae and EOM are normal               Nose: without d/c or deformity               Neck: Neck supple. Gross normal ROM               Cardiovascular: Normal rate and regular rhythm.                 Pulmonary/Chest: Effort normal and breath sounds without rales or wheezing.                Abd:  Soft, NT, ND, + BS, no organomegaly               Neurological: Pt is alert. At baseline orientation, motor grossly intact               Skin: Skin is warm. + erythem areas rashes to multiple areas torso with excoriations, no other new lesions, LE edema - none               Right indx finger DIP with 1+ tender, swelling redness               Psychiatric: Pt behavior is normal  without agitation   Micro: none  Cardiac tracings I have personally interpreted today:  none  Pertinent Radiological findings (summarize): none   Lab Results  Component Value Date   WBC 13.5 (H) 07/28/2021   HGB 13.4 07/28/2021   HCT 43.0 07/28/2021   PLT 291 07/28/2021    GLUCOSE 125 (H) 07/28/2021   CHOL 156 07/17/2021   TRIG 208.0 (H) 07/17/2021   HDL 53.60 07/17/2021   LDLDIRECT 80.0 07/17/2021   LDLCALC 80 01/30/2021   ALT 26 07/28/2021   AST 20 07/28/2021   NA 136 07/28/2021   K 3.7 07/28/2021   CL 100 07/28/2021   CREATININE 0.67 07/28/2021   BUN 14 07/28/2021   CO2 26 07/28/2021   TSH 2.16 07/17/2021   INR 0.92 05/24/2011   HGBA1C 6.4 07/17/2021   MICROALBUR 0.9 10/26/2015   Assessment/Plan:  Debra Atkinson is a 76 y.o. White or Caucasian [1] female with  has a past medical history of Cerebrovascular disease (02/05/2019), Concussion ('06, '11), Diabetes mellitus, Family history of colon cancer (02/28/2016), Fibromyalgia (10/08/2013), Hyperlipidemia, and Varicella.  Finger pain, right Index finger DIP c/w OA flare - for volt gel prn  Chest pain C/w msk strain now rapidly improved, cont to follow  Rash and nonspecific skin eruption Mild to mod, for predpac,  to f/u any worsening symptoms or concerns  Sinusitis, chronic Ok for refer ENT  Followup: Return in about 26 weeks (around 01/31/2022), or if symptoms worsen or fail to improve.  Cathlean Cower, MD 08/06/2021 7:04 AM [facility]

## 2021-08-06 ENCOUNTER — Encounter: Payer: Self-pay | Admitting: Internal Medicine

## 2021-08-06 DIAGNOSIS — M79644 Pain in right finger(s): Secondary | ICD-10-CM | POA: Insufficient documentation

## 2021-08-06 DIAGNOSIS — R079 Chest pain, unspecified: Secondary | ICD-10-CM | POA: Insufficient documentation

## 2021-08-06 NOTE — Assessment & Plan Note (Signed)
C/w msk strain now rapidly improved, cont to follow

## 2021-08-06 NOTE — Assessment & Plan Note (Signed)
Mild to mod, for predpac,  to f/u any worsening symptoms or concerns

## 2021-08-06 NOTE — Assessment & Plan Note (Signed)
[place] for refer ENT

## 2021-08-06 NOTE — Assessment & Plan Note (Signed)
Index finger DIP c/w OA flare - for volt gel prn

## 2021-08-10 ENCOUNTER — Ambulatory Visit: Payer: Medicare HMO | Admitting: Internal Medicine

## 2021-08-10 ENCOUNTER — Ambulatory Visit
Admission: RE | Admit: 2021-08-10 | Discharge: 2021-08-10 | Disposition: A | Discharge status: Home / Self Care | Payer: Medicare HMO | Source: Ambulatory Visit | Attending: Internal Medicine | Admitting: Internal Medicine

## 2021-08-10 ENCOUNTER — Other Ambulatory Visit: Payer: Self-pay

## 2021-08-10 ENCOUNTER — Other Ambulatory Visit (HOSPITAL_COMMUNITY)
Admission: RE | Admit: 2021-08-10 | Discharge: 2021-08-10 | Disposition: A | Discharge status: Home / Self Care | Payer: Medicare HMO | Source: Ambulatory Visit | Attending: Internal Medicine | Admitting: Internal Medicine

## 2021-08-10 DIAGNOSIS — D440 Neoplasm of uncertain behavior of thyroid gland: Secondary | ICD-10-CM | POA: Diagnosis not present

## 2021-08-10 DIAGNOSIS — E041 Nontoxic single thyroid nodule: Secondary | ICD-10-CM | POA: Diagnosis not present

## 2021-08-11 LAB — CYTOLOGY - NON PAP

## 2021-08-25 ENCOUNTER — Encounter (HOSPITAL_COMMUNITY): Payer: Self-pay

## 2021-10-11 DIAGNOSIS — H9313 Tinnitus, bilateral: Secondary | ICD-10-CM | POA: Diagnosis not present

## 2021-10-11 DIAGNOSIS — J310 Chronic rhinitis: Secondary | ICD-10-CM | POA: Diagnosis not present

## 2021-10-11 DIAGNOSIS — H93293 Other abnormal auditory perceptions, bilateral: Secondary | ICD-10-CM | POA: Diagnosis not present

## 2021-10-11 DIAGNOSIS — J343 Hypertrophy of nasal turbinates: Secondary | ICD-10-CM | POA: Diagnosis not present

## 2021-10-11 DIAGNOSIS — J342 Deviated nasal septum: Secondary | ICD-10-CM | POA: Diagnosis not present

## 2021-11-09 IMAGING — DX DG CHEST 1V PORT
1 series · 1 of 1 positions shown · non-contrast
Comparison: May 08, 2018.

CLINICAL DATA: COVID like symptoms.

EXAM:
PORTABLE CHEST 1 VIEW

[chest]
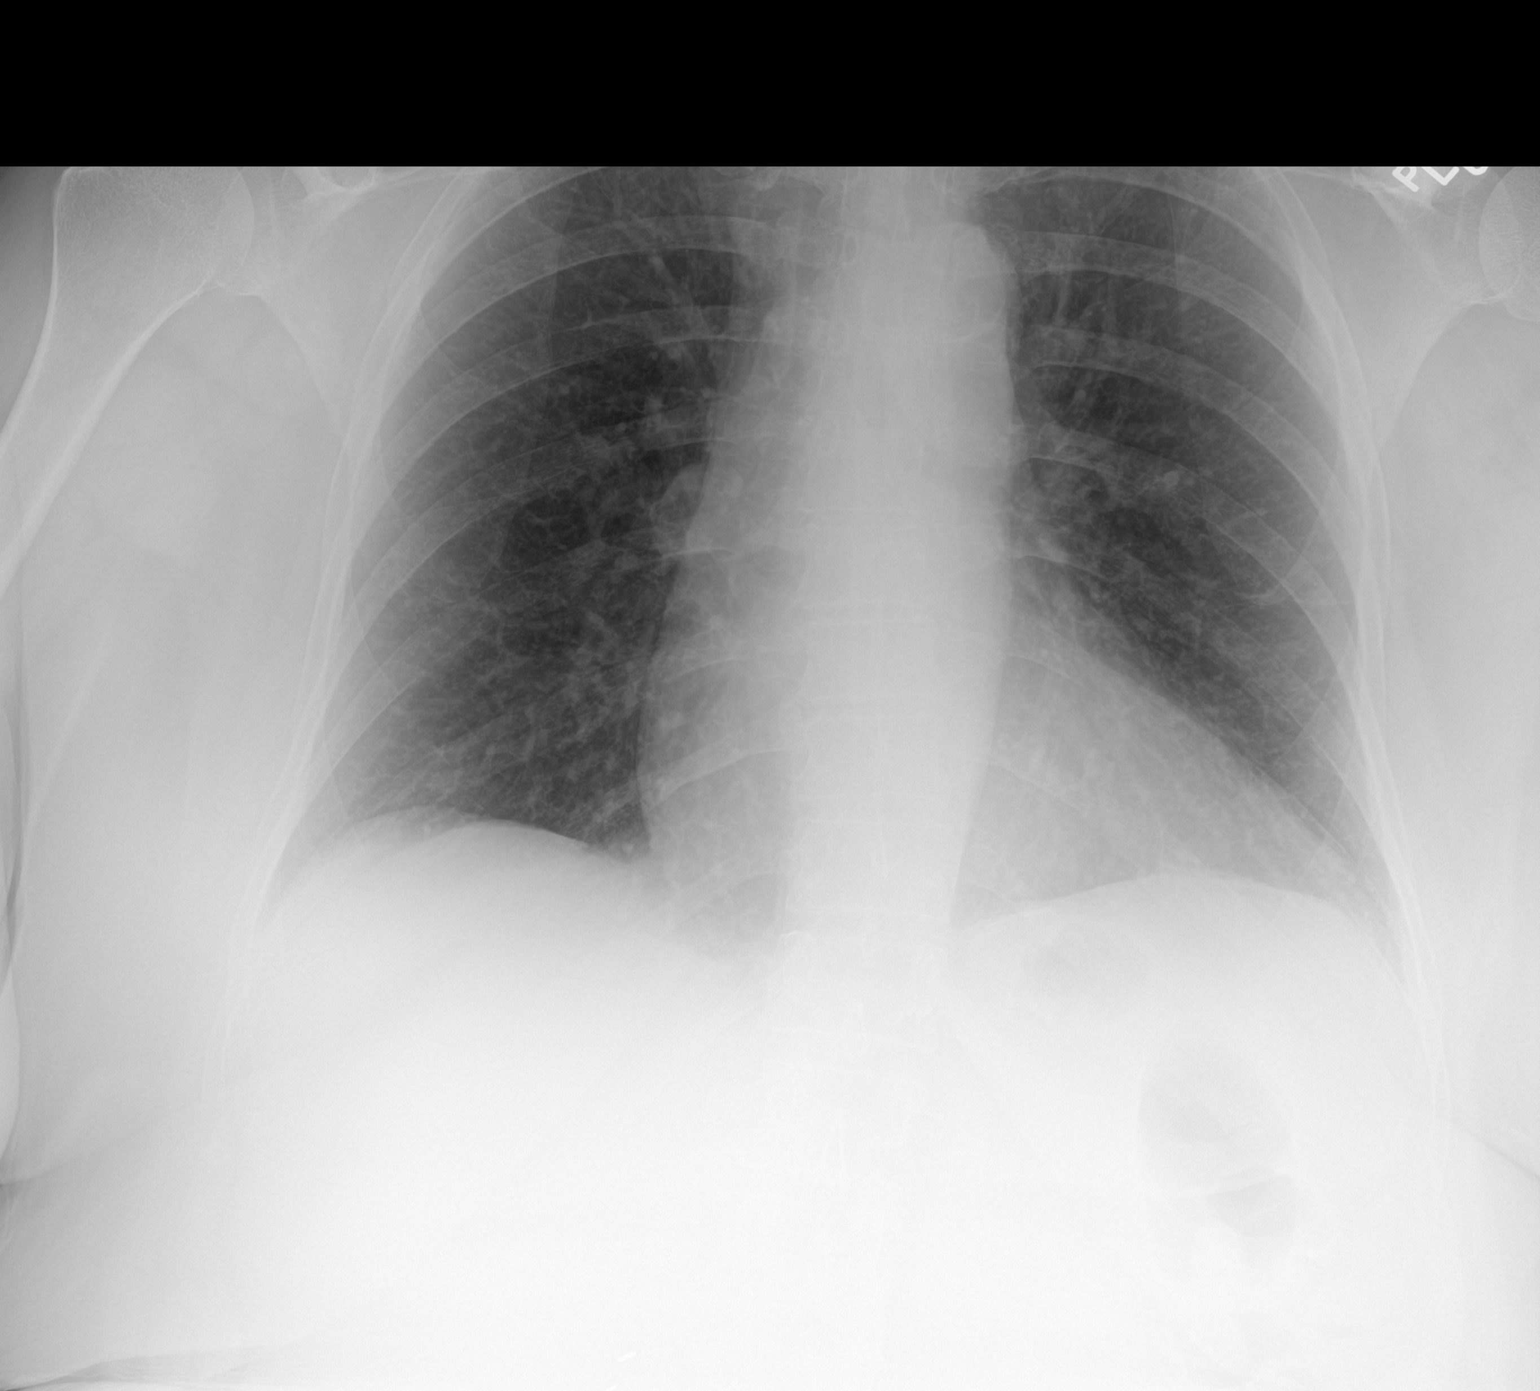

[1 of 1 positions shown; findings below may reference images not displayed]

FINDINGS: The heart size and mediastinal contours are within normal limits.
Both lungs are clear. The visualized skeletal structures are
unremarkable. Aortic calcifications are noted.
IMPRESSION: No active disease.

## 2021-12-06 DIAGNOSIS — H5712 Ocular pain, left eye: Secondary | ICD-10-CM | POA: Diagnosis not present

## 2021-12-06 DIAGNOSIS — T1512XA Foreign body in conjunctival sac, left eye, initial encounter: Secondary | ICD-10-CM | POA: Diagnosis not present

## 2021-12-07 DIAGNOSIS — T1512XD Foreign body in conjunctival sac, left eye, subsequent encounter: Secondary | ICD-10-CM | POA: Diagnosis not present

## 2021-12-07 DIAGNOSIS — H5712 Ocular pain, left eye: Secondary | ICD-10-CM | POA: Diagnosis not present

## 2021-12-18 ENCOUNTER — Ambulatory Visit (INDEPENDENT_AMBULATORY_CARE_PROVIDER_SITE_OTHER): Payer: Medicare HMO

## 2021-12-18 DIAGNOSIS — Z0000 Encounter for general adult medical examination without abnormal findings: Secondary | ICD-10-CM
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
## 2021-12-18 NOTE — Patient Instructions (Signed)
Debra Atkinson , Thank you for taking time to come for your Medicare Wellness Visit. I appreciate your ongoing commitment to your health goals. Please review the following plan we discussed and let me know if I can assist you in the future.   Screening recommendations/referrals: Colonoscopy: 02/19/2022   Mammogram: 05/23/2021 Bone Density: 02/05/2017 Recommended yearly ophthalmology/optometry visit for glaucoma screening and checkup Recommended yearly dental visit for hygiene and checkup  Vaccinations: Influenza vaccine: declined  Pneumococcal vaccine: completed  Tdap vaccine: 12/07/2020 Shingles vaccine: will consider     Advanced directives: none   Conditions/risks identified: none   Next appointment: none    Preventive Care 76 Years and Older, Female Preventive care refers to lifestyle choices and visits with your health care provider that can promote health and wellness. What does preventive care include? A yearly physical exam. This is also called an annual well check. Dental exams once or twice a year. Routine eye exams. Ask your health care provider how often you should have your eyes checked. Personal lifestyle choices, including: Daily care of your teeth and gums. Regular physical activity. Eating a healthy diet. Avoiding tobacco and drug use. Limiting alcohol use. Practicing safe sex. Taking low-dose aspirin every day. Taking vitamin and mineral supplements as recommended by your health care provider. What happens during an annual well check? The services and screenings done by your health care provider during your annual well check will depend on your age, overall health, lifestyle risk factors, and family history of disease. Counseling  Your health care provider may ask you questions about your: Alcohol use. Tobacco use. Drug use. Emotional well-being. Home and relationship well-being. Sexual activity. Eating habits. History of falls. Memory and ability to  understand (cognition). Work and work Statistician. Reproductive health. Screening  You may have the following tests or measurements: Height, weight, and BMI. Blood pressure. Lipid and cholesterol levels. These may be checked every 5 years, or more frequently if you are over 40 years old. Skin check. Lung cancer screening. You may have this screening every year starting at age 76 if you have a 30-pack-year history of smoking and currently smoke or have quit within the past 15 years. Fecal occult blood test (FOBT) of the stool. You may have this test every year starting at age 76. Flexible sigmoidoscopy or colonoscopy. You may have a sigmoidoscopy every 5 years or a colonoscopy every 10 years starting at age 76. Hepatitis C blood test. Hepatitis B blood test. Sexually transmitted disease (STD) testing. Diabetes screening. This is done by checking your blood sugar (glucose) after you have not eaten for a while (fasting). You may have this done every 1-3 years. Bone density scan. This is done to screen for osteoporosis. You may have this done starting at age 76. Mammogram. This may be done every 1-2 years. Talk to your health care provider about how often you should have regular mammograms. Talk with your health care provider about your test results, treatment options, and if necessary, the need for more tests. Vaccines  Your health care provider may recommend certain vaccines, such as: Influenza vaccine. This is recommended every year. Tetanus, diphtheria, and acellular pertussis (Tdap, Td) vaccine. You may need a Td booster every 10 years. Zoster vaccine. You may need this after age 7. Pneumococcal 13-valent conjugate (PCV13) vaccine. One dose is recommended after age 6. Pneumococcal polysaccharide (PPSV23) vaccine. One dose is recommended after age 35. Talk to your health care provider about which screenings and vaccines you need and  how often you need them. This information is not  intended to replace advice given to you by your health care provider. Make sure you discuss any questions you have with your health care provider. Document Released: 07/15/2015 Document Revised: 03/07/2016 Document Reviewed: 04/19/2015 Elsevier Interactive Patient Education  2017 [place] Prevention in the Home Falls can cause injuries. They can happen to people of all ages. There are many things you can do to make your home safe and to help prevent falls. What can I do on the outside of my home? Regularly fix the edges of walkways and driveways and fix any cracks. Remove anything that might make you trip as you walk through a door, such as a raised step or threshold. Trim any bushes or trees on the path to your home. Use bright outdoor lighting. Clear any walking paths of anything that might make someone trip, such as rocks or tools. Regularly check to see if handrails are loose or broken. Make sure that both sides of any steps have handrails. Any raised decks and porches should have guardrails on the edges. Have any leaves, snow, or ice cleared regularly. Use sand or salt on walking paths during winter. Clean up any spills in your garage right away. This includes oil or grease spills. What can I do in the bathroom? Use night lights. Install grab bars by the toilet and in the tub and shower. Do not use towel bars as grab bars. Use non-skid mats or decals in the tub or shower. If you need to sit down in the shower, use a plastic, non-slip stool. Keep the floor dry. Clean up any water that spills on the floor as soon as it happens. Remove soap buildup in the tub or shower regularly. Attach bath mats securely with double-sided non-slip rug tape. Do not have throw rugs and other things on the floor that can make you trip. What can I do in the bedroom? Use night lights. Make sure that you have a light by your bed that is easy to reach. Do not use any sheets or blankets that are  too big for your bed. They should not hang down onto the floor. Have a firm chair that has side arms. You can use this for support while you get dressed. Do not have throw rugs and other things on the floor that can make you trip. What can I do in the kitchen? Clean up any spills right away. Avoid walking on wet floors. Keep items that you use a lot in easy-to-reach places. If you need to reach something above you, use a strong step stool that has a grab bar. Keep electrical cords out of the way. Do not use floor polish or wax that makes floors slippery. If you must use wax, use non-skid floor wax. Do not have throw rugs and other things on the floor that can make you trip. What can I do with my stairs? Do not leave any items on the stairs. Make sure that there are handrails on both sides of the stairs and use them. Fix handrails that are broken or loose. Make sure that handrails are as long as the stairways. Check any carpeting to make sure that it is firmly attached to the stairs. Fix any carpet that is loose or worn. Avoid having throw rugs at the top or bottom of the stairs. If you do have throw rugs, attach them to the floor with carpet tape. Make sure that you have  a light switch at the top of the stairs and the bottom of the stairs. If you do not have them, ask someone to add them for you. What else can I do to help prevent falls? Wear shoes that: Do not have high heels. Have rubber bottoms. Are comfortable and fit you well. Are closed at the toe. Do not wear sandals. If you use a stepladder: Make sure that it is fully opened. Do not climb a closed stepladder. Make sure that both sides of the stepladder are locked into place. Ask someone to hold it for you, if possible. Clearly mark and make sure that you can see: Any grab bars or handrails. First and last steps. Where the edge of each step is. Use tools that help you move around (mobility aids) if they are needed. These  include: Canes. Walkers. Scooters. Crutches. Turn on the lights when you go into a dark area. Replace any light bulbs as soon as they burn out. Set up your furniture so you have a clear path. Avoid moving your furniture around. If any of your floors are uneven, fix them. If there are any pets around you, be aware of where they are. Review your medicines with your doctor. Some medicines can make you feel dizzy. This can increase your chance of falling. Ask your doctor what other things that you can do to help prevent falls. This information is not intended to replace advice given to you by your health care provider. Make sure you discuss any questions you have with your health care provider. Document Released: 04/14/2009 Document Revised: 11/24/2015 Document Reviewed: 07/23/2014 Elsevier Interactive Patient Education  2017 [employer].

## 2022-01-16 ENCOUNTER — Ambulatory Visit (INDEPENDENT_AMBULATORY_CARE_PROVIDER_SITE_OTHER): Payer: Medicare HMO | Admitting: Internal Medicine

## 2022-01-16 ENCOUNTER — Encounter: Payer: Self-pay | Admitting: Internal Medicine

## 2022-01-16 VITALS — BP 128/70 | HR 85 | Temp 98.8°F | Ht 62.0 in | Wt 203.0 lb

## 2022-01-16 DIAGNOSIS — E7800 Pure hypercholesterolemia, unspecified: Secondary | ICD-10-CM | POA: Diagnosis not present

## 2022-01-16 DIAGNOSIS — E538 Deficiency of other specified B group vitamins: Secondary | ICD-10-CM | POA: Diagnosis not present

## 2022-01-16 DIAGNOSIS — T07XXXA Unspecified multiple injuries, initial encounter: Secondary | ICD-10-CM | POA: Diagnosis not present

## 2022-01-16 DIAGNOSIS — E559 Vitamin D deficiency, unspecified: Secondary | ICD-10-CM

## 2022-01-16 DIAGNOSIS — R7303 Prediabetes: Secondary | ICD-10-CM | POA: Diagnosis not present

## 2022-01-16 DIAGNOSIS — Z0001 Encounter for general adult medical examination with abnormal findings: Secondary | ICD-10-CM

## 2022-01-16 DIAGNOSIS — Z0000 Encounter for general adult medical examination without abnormal findings: Secondary | ICD-10-CM | POA: Diagnosis not present

## 2022-01-16 LAB — BASIC METABOLIC PANEL
BUN: 18 mg/dL (ref 6–23)
CO2: 29 mEq/L (ref 19–32)
Calcium: 9.5 mg/dL (ref 8.4–10.5)
Chloride: 104 mEq/L (ref 96–112)
Creatinine, Ser: 0.77 mg/dL (ref 0.40–1.20)
GFR: 75.21 mL/min (ref >60.00)
Glucose, Bld: 112 mg/dL — ABNORMAL HIGH (ref 70–99)
Potassium: 4.6 mEq/L (ref 3.5–5.1)
Sodium: 140 mEq/L (ref 135–145)

## 2022-01-16 LAB — URINALYSIS, ROUTINE W REFLEX MICROSCOPIC
Bilirubin Urine: NEGATIVE
Hgb urine dipstick: NEGATIVE
Ketones, ur: NEGATIVE
Leukocytes,Ua: NEGATIVE
Nitrite: NEGATIVE
Specific Gravity, Urine: 1.02 (ref 1.000–1.030)
Total Protein, Urine: NEGATIVE
Urine Glucose: NEGATIVE
Urobilinogen, UA: 0.2 (ref 0.0–1.0)
pH: 6.5 (ref 5.0–8.0)

## 2022-01-16 LAB — CBC WITH DIFFERENTIAL/PLATELET
Basophils Absolute: 0 10*3/uL (ref 0.0–0.1)
Basophils Relative: 0.7 % (ref 0.0–3.0)
Eosinophils Absolute: 0.1 10*3/uL (ref 0.0–0.7)
Eosinophils Relative: 1.3 % (ref 0.0–5.0)
HCT: 38.7 % (ref 36.0–46.0)
Hemoglobin: 12.8 g/dL (ref 12.0–15.0)
Lymphocytes Relative: 38.2 % (ref 12.0–46.0)
Lymphs Abs: 2.5 10*3/uL (ref 0.7–4.0)
MCHC: 33.1 g/dL (ref 30.0–36.0)
MCV: 83.4 fl (ref 78.0–100.0)
Monocytes Absolute: 0.6 10*3/uL (ref 0.1–1.0)
Monocytes Relative: 9.4 % (ref 3.0–12.0)
Neutro Abs: 3.3 10*3/uL (ref 1.4–7.7)
Neutrophils Relative %: 50.4 % (ref 43.0–77.0)
Platelets: 314 10*3/uL (ref 150.0–400.0)
RBC: 4.64 Mil/uL (ref 3.87–5.11)
RDW: 14.2 % (ref 11.5–15.5)
WBC: 6.6 10*3/uL (ref 4.0–10.5)

## 2022-01-16 LAB — LIPID PANEL
Cholesterol: 151 mg/dL (ref 0–200)
HDL: 55.7 mg/dL (ref >39.00)
LDL Cholesterol: 78 mg/dL (ref 0–99)
NonHDL: 95.57
Total CHOL/HDL Ratio: 3
Triglycerides: 87 mg/dL (ref 0.0–149.0)
VLDL: 17.4 mg/dL (ref 0.0–40.0)

## 2022-01-16 LAB — HEPATIC FUNCTION PANEL
ALT: 26 U/L (ref 0–35)
AST: 19 U/L (ref 0–37)
Albumin: 4.3 g/dL (ref 3.5–5.2)
Alkaline Phosphatase: 95 U/L (ref 39–117)
Bilirubin, Direct: 0.1 mg/dL (ref 0.0–0.3)
Total Bilirubin: 0.5 mg/dL (ref 0.2–1.2)
Total Protein: 7.2 g/dL (ref 6.0–8.3)

## 2022-01-16 LAB — MICROALBUMIN / CREATININE URINE RATIO
Creatinine,U: 83.8 mg/dL
Microalb Creat Ratio: 0.9 mg/g (ref 0.0–30.0)
Microalb, Ur: 0.8 mg/dL (ref 0.0–1.9)

## 2022-01-16 LAB — HEMOGLOBIN A1C: Hgb A1c MFr Bld: 6.6 % — ABNORMAL HIGH (ref 4.6–6.5)

## 2022-01-16 LAB — TSH: TSH: 2.1 u[IU]/mL (ref 0.35–5.50)

## 2022-01-16 LAB — VITAMIN B12: Vitamin B-12: 592 pg/mL (ref 211–911)

## 2022-01-16 LAB — VITAMIN D 25 HYDROXY (VIT D DEFICIENCY, FRACTURES): VITD: 41.54 ng/mL (ref 30.00–100.00)

## 2022-01-16 NOTE — Progress Notes (Signed)
Patient ID: Debra Atkinson, female   DOB: 01/26/1946, 76 y.o.   MRN: [id number]         Chief Complaint:: wellness exam and Follow-up (6 month f/u and patient fell 01/13/22)  , hyperglycemia, low vit d, hld       HPI:  Debra Atkinson is a 76 y.o. female here for wellness exam; declines shingrix, o/w up to date                        Also c/o persistent ringing bilateral in the ears more or less mild constant with occasional vertigo.  Also fell x 1 wk with multiple bruising and tender soreness withou other injury to left lateral hip and distal mid anterior leg.  Pt denies chest pain, increased sob or doe, wheezing, orthopnea, PND, increased LE swelling, palpitations, dizziness or syncope.   Pt denies polydipsia, polyuria, or new focal neuro s/s.    Pt denies fever, wt loss, night sweats, loss of appetite, or other constitutional symptoms     Wt Readings from Last 3 Encounters:  01/16/22 203 lb (92.1 kg)  08/02/21 201 lb 9.6 oz (91.4 kg)  07/28/21 200 lb (90.7 kg)   BP Readings from Last 3 Encounters:  01/16/22 128/70  08/02/21 132/78  07/28/21 132/68   Immunization History  Administered Date(s) Administered   Fluad Quad(high Dose 65+) 05/07/2019   Influenza Split 05/04/2011, 04/28/2012   Influenza, High Dose Seasonal PF 03/28/2017, 05/08/2018   Influenza,inj,Quad PF,6+ Mos 05/18/2013, 04/09/2014, 04/15/2015, 04/26/2016   Influenza-Unspecified 04/04/2015   Pneumococcal Conjugate-13 10/08/2014   Pneumococcal Polysaccharide-23 05/04/2011   Tdap 04/28/2012, 12/07/2020   There are no preventive care reminders to display for this patient.     Past Medical History:  Diagnosis Date   Cerebrovascular disease 02/05/2019   Concussion '06, '11   Diabetes mellitus    diet management   Family history of colon cancer 02/28/2016   Fibromyalgia 10/08/2013   Hyperlipidemia    diet managed   Varicella    Past Surgical History:  Procedure Laterality Date   [place]   laparotomy    reports that she has never smoked. She has never used smokeless tobacco. She reports that she does not drink alcohol and does not use drugs. family history includes COPD in her mother; Cancer (age of onset: 44) in her mother; Diabetes in her maternal grandfather and mother; Heart disease in her father and mother; Rheumatic fever in her father. Allergies  Allergen Reactions   Methylprednisolone Itching   Current Outpatient Medications on File Prior to Visit  Medication Sig Dispense Refill   Cholecalciferol ([employer] D3) 50 MCG (2000 UT) CAPS Take by mouth.      EQ ALLERGY RELIEF, CETIRIZINE, 10 MG tablet Take 1 tablet by mouth once daily 90 tablet 0   ibuprofen (ADVIL,MOTRIN) 200 MG tablet Take 200 mg by mouth every 6 (six) hours as needed.     Multiple Vitamins-Minerals (MULTIVITAMIN WITH MINERALS) tablet Take 1 tablet by mouth daily.     Multiple Vitamins-Minerals (PRESERVISION AREDS 2 PO) Take by mouth.     rosuvastatin (CRESTOR) 40 MG tablet Take 1 tablet by mouth once daily 90 tablet 3   triamcinolone (NASACORT AQ) 55 MCG/ACT AERO nasal inhaler Place 2 sprays into the nose daily. 3 Inhaler 3   Cyanocobalamin ([employer] VITAMIN B-12 PO) Take 2,500 tablets by mouth every other  day. (Patient not taking: Reported on 01/16/2022)     diazepam (VALIUM) 5 MG tablet Take 1 tablet (5 mg total) by mouth every 12 (twelve) hours as needed for anxiety. (Patient not taking: Reported on 08/02/2021) 10 tablet 2   fluocinonide cream (LIDEX) 1.19 % Apply 1 application topically 2 (two) times daily. (Patient not taking: Reported on 01/16/2022)     lidocaine (LIDODERM) 5 % Place 1 patch onto the skin daily. Remove & Discard patch within 12 hours or as directed by MD (Patient not taking: Reported on 12/18/2021) 30 patch 0   tiZANidine (ZANAFLEX) 4 MG tablet Take 1 tablet (4 mg total) by mouth every 6 (six) hours as needed for muscle spasms. (Patient not taking: Reported on  08/02/2021) 30 tablet 1   traMADol (ULTRAM) 50 MG tablet Take 1 tablet (50 mg total) by mouth every 6 (six) hours as needed. (Patient not taking: Reported on 08/02/2021) 30 tablet 0   triamcinolone cream (KENALOG) 0.1 % Apply 1 application topically 2 (two) times daily as needed. (Patient not taking: Reported on 01/16/2022) 30 g 1   No current facility-administered medications on file prior to visit.        ROS:  All others reviewed and negative.  Objective        PE:  BP 128/70 (BP Location: Right Arm, Patient Position: Sitting, Cuff Size: Large)   Pulse 85   Temp 98.8 F (37.1 C) (Oral)   Ht '5\' 2"'$  (1.575 m)   Wt 203 lb (92.1 kg)   SpO2 97%   BMI 37.13 kg/m                 Constitutional: Pt appears in NAD               HENT: Head: NCAT.                Right Ear: External ear normal.                 Left Ear: External ear normal.                Eyes: . Pupils are equal, round, and reactive to light. Conjunctivae and EOM are normal               Nose: without d/c or deformity               Neck: Neck supple. Gross normal ROM               Cardiovascular: Normal rate and regular rhythm.                 Pulmonary/Chest: Effort normal and breath sounds without rales or wheezing.                Abd:  Soft, NT, ND, + BS, no organomegaly               Neurological: Pt is alert. At baseline orientation, motor grossly intact               Skin: Skin is warm.  LE edema - none, has large greenish yellow bruising to left lateral hip and left distal anterior leg               Psychiatric: Pt behavior is normal without agitation   Micro: none  Cardiac tracings I have personally interpreted today:  none  Pertinent Radiological findings (summarize): none   Lab Results  Component Value Date  WBC 6.6 01/16/2022   HGB 12.8 01/16/2022   HCT 38.7 01/16/2022   PLT 314.0 01/16/2022   GLUCOSE 112 (H) 01/16/2022   CHOL 151 01/16/2022   TRIG 87.0 01/16/2022   HDL 55.70 01/16/2022   LDLDIRECT  80.0 07/17/2021   LDLCALC 78 01/16/2022   ALT 26 01/16/2022   AST 19 01/16/2022   NA 140 01/16/2022   K 4.6 01/16/2022   CL 104 01/16/2022   CREATININE 0.77 01/16/2022   BUN 18 01/16/2022   CO2 29 01/16/2022   TSH 2.10 01/16/2022   INR 0.92 05/24/2011   HGBA1C 6.6 (H) 01/16/2022   MICROALBUR 0.8 01/16/2022   Assessment/Plan:  Debra Atkinson is a 76 y.o. White or Caucasian [1] female with  has a past medical history of Cerebrovascular disease (02/05/2019), Concussion ('06, '11), Diabetes mellitus, Family history of colon cancer (02/28/2016), Fibromyalgia (10/08/2013), Hyperlipidemia, and Varicella.  Vitamin D deficiency Last vitamin D Lab Results  Component Value Date   VD25OH 40.95 07/17/2021   Stable, cont oral replacement   B12 deficiency Lab Results  Component Value Date   VITAMINB12 812 07/17/2021   Stable, cont oral replacement - b12 1000 mcg qd  Encounter for well adult exam with abnormal findings Age and sex appropriate education and counseling updated with regular exercise and diet Referrals for preventative services - none needed Immunizations addressed - declines shingrix - will do at local pharmacy Smoking counseling  - none needed Evidence for depression or other mood disorder - chronic anxiety stable Most recent labs reviewed. I have personally reviewed and have noted: 1) the patient's medical and social history 2) The patient's current medications and supplements 3) The patient's height, weight, and BMI have been recorded in the chart   Pre-diabetes Lab Results  Component Value Date   HGBA1C 6.6 (H) 01/16/2022   Stable, pt to continue current medical treatment  - diet, wt control, excercise   Multiple contusions Large but benign, healing, no imaging needed, pt reassured  Hyperlipidemia Lab Results  Component Value Date   [place] 78 01/16/2022   Uncontrolled, goal ldl < 70,, pt to continue current statin crestor 40 mg qd as declines change for  now, will work further on lower chol diet  Followup: No follow-ups on file.  Cathlean Cower, MD 01/20/2022 3:59 PM [facility]

## 2022-01-16 NOTE — Assessment & Plan Note (Signed)
Last vitamin D Lab Results  Component Value Date   VD25OH 40.95 07/17/2021   Stable, cont oral replacement

## 2022-01-16 NOTE — Assessment & Plan Note (Signed)
Lab Results  Component Value Date   VITAMINB12 812 07/17/2021   Stable, cont oral replacement - b12 1000 mcg qd

## 2022-01-16 NOTE — Patient Instructions (Signed)
Please consider having your Shingles shot at the local pharmacy as it may be free  Please continue all other medications as before, and refills have been done if requested.  Please have the pharmacy call with any other refills you may need.  Please continue your efforts at being more active, low cholesterol diet, and weight control.  You are otherwise up to date with prevention measures today.  Please keep your appointments with your specialists as you may have planned  Please go to the LAB at the blood drawing area for the tests to be done.  You will be contacted by phone if any changes need to be made immediately.  Otherwise, you will receive a letter about your results with an explanation, but please check with MyChart first.  Please remember to sign up for MyChart if you have not done so, as this will be important to you in the future with finding out test results, communicating by private email, and scheduling acute appointments online when needed.  Please make an Appointment to return in 6 months, or sooner if needed

## 2022-01-18 ENCOUNTER — Ambulatory Visit: Payer: Medicare HMO | Admitting: Internal Medicine

## 2022-01-20 ENCOUNTER — Encounter: Payer: Self-pay | Admitting: Internal Medicine

## 2022-01-20 NOTE — Assessment & Plan Note (Signed)
Lab Results  Component Value Date   [place] 78 01/16/2022   Uncontrolled, goal ldl < 70,, pt to continue current statin crestor 40 mg qd as declines change for now, will work further on lower chol diet

## 2022-01-20 NOTE — Assessment & Plan Note (Signed)
Age and sex appropriate education and counseling updated with regular exercise and diet Referrals for preventative services - none needed Immunizations addressed - declines shingrix - will do at local pharmacy Smoking counseling  - none needed Evidence for depression or other mood disorder - chronic anxiety stable Most recent labs reviewed. I have personally reviewed and have noted: 1) the patient's medical and social history 2) The patient's current medications and supplements 3) The patient's height, weight, and BMI have been recorded in the chart

## 2022-01-20 NOTE — Assessment & Plan Note (Signed)
Lab Results  Component Value Date   HGBA1C 6.6 (H) 01/16/2022   Stable, pt to continue current medical treatment  - diet, wt control, excercise

## 2022-01-20 NOTE — Assessment & Plan Note (Signed)
Large but benign, healing, no imaging needed, pt reassured

## 2022-01-31 ENCOUNTER — Ambulatory Visit: Payer: Medicare HMO | Admitting: Internal Medicine

## 2022-01-31 DIAGNOSIS — E119 Type 2 diabetes mellitus without complications: Secondary | ICD-10-CM | POA: Diagnosis not present

## 2022-01-31 DIAGNOSIS — H353131 Nonexudative age-related macular degeneration, bilateral, early dry stage: Secondary | ICD-10-CM | POA: Diagnosis not present

## 2022-01-31 DIAGNOSIS — H2513 Age-related nuclear cataract, bilateral: Secondary | ICD-10-CM | POA: Diagnosis not present

## 2022-02-19 DIAGNOSIS — R109 Unspecified abdominal pain: Secondary | ICD-10-CM | POA: Diagnosis not present

## 2022-02-19 DIAGNOSIS — Z86010 Personal history of colonic polyps: Secondary | ICD-10-CM | POA: Diagnosis not present

## 2022-02-22 DIAGNOSIS — Z800 Family history of malignant neoplasm of digestive organs: Secondary | ICD-10-CM | POA: Diagnosis not present

## 2022-02-22 DIAGNOSIS — Z09 Encounter for follow-up examination after completed treatment for conditions other than malignant neoplasm: Secondary | ICD-10-CM | POA: Diagnosis not present

## 2022-02-22 DIAGNOSIS — K5730 Diverticulosis of large intestine without perforation or abscess without bleeding: Secondary | ICD-10-CM | POA: Diagnosis not present

## 2022-02-22 DIAGNOSIS — D120 Benign neoplasm of cecum: Secondary | ICD-10-CM | POA: Diagnosis not present

## 2022-02-22 DIAGNOSIS — D124 Benign neoplasm of descending colon: Secondary | ICD-10-CM | POA: Diagnosis not present

## 2022-02-22 DIAGNOSIS — D123 Benign neoplasm of transverse colon: Secondary | ICD-10-CM | POA: Diagnosis not present

## 2022-02-22 DIAGNOSIS — Z86010 Personal history of colonic polyps: Secondary | ICD-10-CM | POA: Diagnosis not present

## 2022-02-22 DIAGNOSIS — K649 Unspecified hemorrhoids: Secondary | ICD-10-CM | POA: Diagnosis not present

## 2022-02-23 ENCOUNTER — Other Ambulatory Visit: Payer: Self-pay | Admitting: Internal Medicine

## 2022-02-23 NOTE — Telephone Encounter (Signed)
Please refill as per office routine med refill policy (all routine meds to be refilled for 3 mo or monthly (per pt preference) up to one year from last visit, then month to month grace period for 3 mo, then further med refills will have to be denied) ? ?

## 2022-02-26 DIAGNOSIS — D120 Benign neoplasm of cecum: Secondary | ICD-10-CM | POA: Diagnosis not present

## 2022-02-26 DIAGNOSIS — D123 Benign neoplasm of transverse colon: Secondary | ICD-10-CM | POA: Diagnosis not present

## 2022-02-26 DIAGNOSIS — D124 Benign neoplasm of descending colon: Secondary | ICD-10-CM | POA: Diagnosis not present

## 2022-02-26 NOTE — Telephone Encounter (Signed)
Pt is calling for an update on her Refill request.

## 2022-03-19 DIAGNOSIS — Z6838 Body mass index (BMI) 38.0-38.9, adult: Secondary | ICD-10-CM | POA: Diagnosis not present

## 2022-03-19 DIAGNOSIS — Z01419 Encounter for gynecological examination (general) (routine) without abnormal findings: Secondary | ICD-10-CM | POA: Diagnosis not present

## 2022-03-19 DIAGNOSIS — Z124 Encounter for screening for malignant neoplasm of cervix: Secondary | ICD-10-CM | POA: Diagnosis not present

## 2022-05-04 ENCOUNTER — Telehealth: Payer: Self-pay | Admitting: Internal Medicine

## 2022-05-04 NOTE — Telephone Encounter (Signed)
Pt called to report symptoms of what she thinks is a sinus infection. Son went to urgent care and was given steroid.  Nasal drainage down throat. Chest feels slightly heavy. Vertigo. Blood on tissue when blowing nose.  Scheduled pt for Monday 11/6 at 3 pm. Please advise if there are any other recommendations.

## 2022-05-07 ENCOUNTER — Ambulatory Visit (INDEPENDENT_AMBULATORY_CARE_PROVIDER_SITE_OTHER): Payer: Medicare HMO

## 2022-05-07 ENCOUNTER — Ambulatory Visit (INDEPENDENT_AMBULATORY_CARE_PROVIDER_SITE_OTHER): Payer: Medicare HMO | Admitting: Internal Medicine

## 2022-05-07 ENCOUNTER — Encounter: Payer: Self-pay | Admitting: Internal Medicine

## 2022-05-07 VITALS — BP 134/72 | HR 83 | Temp 98.3°F | Ht 62.0 in | Wt 203.5 lb

## 2022-05-07 DIAGNOSIS — M545 Low back pain, unspecified: Secondary | ICD-10-CM

## 2022-05-07 DIAGNOSIS — G8929 Other chronic pain: Secondary | ICD-10-CM

## 2022-05-07 DIAGNOSIS — R040 Epistaxis: Secondary | ICD-10-CM

## 2022-05-07 DIAGNOSIS — J069 Acute upper respiratory infection, unspecified: Secondary | ICD-10-CM | POA: Diagnosis not present

## 2022-05-07 DIAGNOSIS — R079 Chest pain, unspecified: Secondary | ICD-10-CM

## 2022-05-07 DIAGNOSIS — J309 Allergic rhinitis, unspecified: Secondary | ICD-10-CM | POA: Diagnosis not present

## 2022-05-07 MED ORDER — PREDNISONE 10 MG PO TABS: ORAL_TABLET | ORAL | 0 refills | Status: DC

## 2022-05-07 MED ORDER — AZITHROMYCIN 250 MG PO TABS: ORAL_TABLET | ORAL | 1 refills | Status: AC

## 2022-05-07 NOTE — Assessment & Plan Note (Signed)
Atpyical, exam benign, etiology unclear, very low suspicion for cardiac, for cxr

## 2022-05-07 NOTE — Patient Instructions (Signed)
Please take all new medication as prescribed - the antibiotic, and prednisone  Ok to change the flonase to the OTC Nasacort  Please continue all other medications as before, and refills have been done if requested.  Please have the pharmacy call with any other refills you may need.  Please continue your efforts at being more active, low cholesterol diet, and weight control.  You are otherwise up to date with prevention measures today.  Please keep your appointments with your specialists as you may have planned  Please go to the XRAY Department in the first floor for the x-ray testing - for the chest , and lower back  You will be contacted by phone if any changes need to be made immediately.  Otherwise, you will receive a letter about your results with an explanation, but please check with MyChart first.  Please remember to sign up for MyChart if you have not done so, as this will be important to you in the future with finding out test results, communicating by private email, and scheduling acute appointments online when needed.

## 2022-05-07 NOTE — Assessment & Plan Note (Signed)
Mild to mod, to continue zyrtec, start nasacort asd prn,  to f/u any worsening symptoms or concerns

## 2022-05-07 NOTE — Assessment & Plan Note (Signed)
One episode yesterday, for change flonase to nasacort asd,  to f/u any worsening symptoms or concerns

## 2022-05-07 NOTE — Progress Notes (Addendum)
Patient ID: Debra Atkinson, female   DOB: [birth year], 76 y.o.   MRN: [id number]        Chief Complaint: follow up URI, allergies, nosebleed x 1, chest pain, low back pain        HPI:  Debra Atkinson is a 76 y.o. female  Here with 2-3 days acute onset fever, facial pain, pressure, headache, general weakness and malaise, and greenish d/c, with mild ST and cough, but pt denies wheezing, increased sob or doe, orthopnea, PND, increased LE swelling, palpitations, dizziness or syncope. Does have vague hx 1 wk fleeting sharp CP in different frontal locations and sometimes radiation to the back.  Does have several wks ongoing nasal allergy symptoms with clearish congestion, itch and sneezing, without fever, pain, ST, cough, swelling or wheezing.  Did have a nosebleed x 1 mild yesterday, has been using flonase.  Also incidentally, Pt continues to have recurring LBP worse in last 3 days, but no bowel or bladder change, fever, wt loss,  worsening LE pain/numbness/weakness, gait change or falls.       Wt Readings from Last 3 Encounters:  05/07/22 203 lb 8 oz (92.3 kg)  01/16/22 203 lb (92.1 kg)  08/02/21 201 lb 9.6 oz (91.4 kg)   BP Readings from Last 3 Encounters:  05/07/22 134/72  01/16/22 128/70  08/02/21 132/78         Past Medical History:  Diagnosis Date   Cerebrovascular disease 02/05/2019   Concussion '06, '11   Diabetes mellitus    diet management   Family history of colon cancer 02/28/2016   Fibromyalgia 10/08/2013   Hyperlipidemia    diet managed   Varicella    Past Surgical History:  Procedure Laterality Date   [place]   laparotomy    reports that she has never smoked. She has never used smokeless tobacco. She reports that she does not drink alcohol and does not use drugs. family history includes COPD in her mother; Cancer (age of onset: 29) in her mother; Diabetes in her maternal grandfather and mother; Heart disease in  her father and mother; Rheumatic fever in her father. Allergies  Allergen Reactions   Codeine Other (See Comments)   Methylprednisolone Itching   Current Outpatient Medications on File Prior to Visit  Medication Sig Dispense Refill   Cholecalciferol ([employer] D3) 50 MCG (2000 UT) CAPS Take by mouth.      Cyanocobalamin (CVS VITAMIN B-12 PO) Take 2,500 tablets by mouth every other day.     EQ ALLERGY RELIEF, CETIRIZINE, 10 MG tablet Take 1 tablet by mouth once daily 90 tablet 0   fluocinonide cream (LIDEX) 6.62 % Apply 1 application  topically 2 (two) times daily.     ibuprofen (ADVIL,MOTRIN) 200 MG tablet Take 200 mg by mouth every 6 (six) hours as needed.     Multiple Vitamins-Minerals (MULTIVITAMIN WITH MINERALS) tablet Take 1 tablet by mouth daily.     Multiple Vitamins-Minerals (PRESERVISION AREDS 2 PO) Take by mouth.     rosuvastatin (CRESTOR) 40 MG tablet Take 1 tablet by mouth once daily 90 tablet 0   tiZANidine (ZANAFLEX) 4 MG tablet Take 1 tablet (4 mg total) by mouth every 6 (six) hours as needed for muscle spasms. 30 tablet 1   traMADol (ULTRAM) 50 MG tablet Take 1 tablet (50 mg total) by mouth every 6 (six) hours as needed. 30 tablet 0   triamcinolone cream (  KENALOG) 0.1 % Apply 1 application topically 2 (two) times daily as needed. 30 g 1   diazepam (VALIUM) 5 MG tablet Take 1 tablet (5 mg total) by mouth every 12 (twelve) hours as needed for anxiety. (Patient not taking: Reported on 08/02/2021) 10 tablet 2   lidocaine (LIDODERM) 5 % Place 1 patch onto the skin daily. Remove & Discard patch within 12 hours or as directed by MD (Patient not taking: Reported on 12/18/2021) 30 patch 0   triamcinolone (NASACORT AQ) 55 MCG/ACT AERO nasal inhaler Place 2 sprays into the nose daily. (Patient not taking: Reported on 05/07/2022) 3 Inhaler 3   No current facility-administered medications on file prior to visit.        ROS:  All others reviewed and negative.  Objective        PE:  BP 134/72    Pulse 83   Temp 98.3 F (36.8 C) (Oral)   Ht '5\' 2"'$  (1.575 m)   Wt 203 lb 8 oz (92.3 kg)   SpO2 95%   BMI 37.22 kg/m                 Constitutional: Pt appears in NAD               HENT: Head: NCAT.                Right Ear: External ear normal.                 Left Ear: External ear normal. Bilat tm's with mild erythema.  Max sinus areas mild tender.  Pharynx with mild erythema, no exudate                Eyes: . Pupils are equal, round, and reactive to light. Conjunctivae and EOM are normal               Nose: without d/c or deformity               Neck: Neck supple. Gross normal ROM               Cardiovascular: Normal rate and regular rhythm.                 Pulmonary/Chest: Effort normal and breath sounds without rales or wheezing.                Abd:  Soft, NT, ND, + BS, no organomegaly               Lumbar spine nontender, no swelling or rash               Neurological: Pt is alert. At baseline orientation, motor grossly intact               Skin: Skin is warm. No rashes, no other new lesions, LE edema - none               Psychiatric: Pt behavior is normal without agitation   Micro: none  Cardiac tracings I have personally interpreted today:  none  Pertinent Radiological findings (summarize): none   Lab Results  Component Value Date   WBC 6.6 01/16/2022   HGB 12.8 01/16/2022   HCT 38.7 01/16/2022   PLT 314.0 01/16/2022   GLUCOSE 112 (H) 01/16/2022   CHOL 151 01/16/2022   TRIG 87.0 01/16/2022   HDL 55.70 01/16/2022   LDLDIRECT 80.0 07/17/2021   LDLCALC 78 01/16/2022   ALT 26 01/16/2022  AST 19 01/16/2022   NA 140 01/16/2022   K 4.6 01/16/2022   CL 104 01/16/2022   CREATININE 0.77 01/16/2022   BUN 18 01/16/2022   CO2 29 01/16/2022   TSH 2.10 01/16/2022   INR 0.92 05/24/2011   HGBA1C 6.6 (H) 01/16/2022   MICROALBUR 0.8 01/16/2022   Assessment/Plan:  Debra Atkinson is a 76 y.o. White or Caucasian [1] female with  has a past medical history of  Cerebrovascular disease (02/05/2019), Concussion ('06, '11), Diabetes mellitus, Family history of colon cancer (02/28/2016), Fibromyalgia (10/08/2013), Hyperlipidemia, and Varicella.  URI (upper respiratory infection) Mild to mod, for antibx course,  to f/u any worsening symptoms or concerns  Allergic rhinitis Mild to mod, to continue zyrtec, start nasacort asd prn,  to f/u any worsening symptoms or concerns   Chest pain Atpyical, exam benign, etiology unclear, very low suspicion for cardiac, for cxr  Low back pain I suspect underlying lumbar djd, ddd - for ls spine xray  Nosebleed One episode yesterday, for change flonase to nasacort asd,  to f/u any worsening symptoms or concerns  Followup: Return in about 2 months (around 07/19/2022).  Cathlean Cower, MD 05/07/2022 4:46 PM [facility]

## 2022-05-07 NOTE — Assessment & Plan Note (Signed)
Mild to mod, for antibx course,  to f/u any worsening symptoms or concerns 

## 2022-05-07 NOTE — Assessment & Plan Note (Signed)
I suspect underlying lumbar djd, ddd - for ls spine xray

## 2022-05-08 NOTE — Telephone Encounter (Signed)
RX sent by provider

## 2022-05-29 ENCOUNTER — Other Ambulatory Visit: Payer: Self-pay | Admitting: Internal Medicine

## 2022-05-29 DIAGNOSIS — Z1231 Encounter for screening mammogram for malignant neoplasm of breast: Secondary | ICD-10-CM | POA: Diagnosis not present

## 2022-05-29 LAB — HM MAMMOGRAPHY

## 2022-05-29 NOTE — Telephone Encounter (Signed)
Patient called back and need this medication today - she only has 1 pill left

## 2022-05-29 NOTE — Telephone Encounter (Signed)
Please refill as per office routine med refill policy (all routine meds to be refilled for 3 mo or monthly (per pt preference) up to one year from last visit, then month to month grace period for 3 mo, then further med refills will have to be denied) ? ?

## 2022-06-04 NOTE — Progress Notes (Signed)
05/29/22.

## 2022-06-14 ENCOUNTER — Ambulatory Visit (INDEPENDENT_AMBULATORY_CARE_PROVIDER_SITE_OTHER): Payer: Medicare HMO | Admitting: Family Medicine

## 2022-06-14 ENCOUNTER — Encounter: Payer: Self-pay | Admitting: Family Medicine

## 2022-06-14 VITALS — BP 124/84 | HR 101 | Temp 97.7°F | Wt 203.4 lb

## 2022-06-14 DIAGNOSIS — R0789 Other chest pain: Secondary | ICD-10-CM | POA: Diagnosis not present

## 2022-06-14 DIAGNOSIS — R059 Cough, unspecified: Secondary | ICD-10-CM | POA: Diagnosis not present

## 2022-06-14 DIAGNOSIS — R0689 Other abnormalities of breathing: Secondary | ICD-10-CM

## 2022-06-14 DIAGNOSIS — R062 Wheezing: Secondary | ICD-10-CM

## 2022-06-14 DIAGNOSIS — J849 Interstitial pulmonary disease, unspecified: Secondary | ICD-10-CM

## 2022-06-14 DIAGNOSIS — J22 Unspecified acute lower respiratory infection: Secondary | ICD-10-CM | POA: Diagnosis not present

## 2022-06-14 LAB — POC COVID19 BINAXNOW: SARS Coronavirus 2 Ag: NEGATIVE

## 2022-06-14 LAB — POCT INFLUENZA A/B
Influenza A, POC: NEGATIVE
Influenza B, POC: NEGATIVE

## 2022-06-14 MED ORDER — AZITHROMYCIN 250 MG PO TABS: ORAL_TABLET | ORAL | 0 refills | Status: DC

## 2022-06-14 MED ORDER — PREDNISONE 50 MG PO TABS: ORAL_TABLET | ORAL | 0 refills | Status: DC

## 2022-06-14 MED ORDER — ALBUTEROL SULFATE HFA 108 (90 BASE) MCG/ACT IN AERS: 2.0000 | INHALATION_SPRAY | Freq: Four times a day (QID) | RESPIRATORY_TRACT | 0 refills | Status: DC | PRN
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
## 2022-06-14 NOTE — Patient Instructions (Signed)
It was a pleasure to see you today! Thank you for choosing [facility] for your primary care. Debra Atkinson was seen for cough. You likely have a viral respiratory infection, but I am unsure if you have any underlying lung disease. We are treating with steroids and azithromycin. You may also use an albuterol inhaler. Please follow up if no improvement.   Go to ED for severe chest pain or shortness of breath or any other worrisome symptom.   Please follow up with Biagio Borg, MD after you have improved to disucss further assessment.    Best,  Marny Lowenstein, MD, MS FAMILY MEDICINE RESIDENT - PGY1 06/14/2022 11:41 AM

## 2022-06-15 ENCOUNTER — Telehealth: Payer: Self-pay | Admitting: Internal Medicine

## 2022-06-15 NOTE — Telephone Encounter (Signed)
Patient is aware of annotation below and verbalized understanding. She states that her cough is still pretty bad. I advised her that if it gets unbearable and her symptoms worsen with sob or chest pain over the weekend to visit her nearest ED. She verbalized understanding

## 2022-06-15 NOTE — Telephone Encounter (Signed)
Dr Grandville Silos saw pt yesterday 06/14/22 and prescribed her  albuterol (VENTOLIN HFA) 108 (90 Base) MCG/ACT inhaler [[id number]]  azithromycin (ZITHROMAX) 250 MG tablet [[id number]]  predniSONE (DELTASONE) 50 MG tablet [[id number]].   She is wanting to know if she can also take Ibuprofen along with these. Please advise pt @ [phone]

## 2022-06-19 ENCOUNTER — Ambulatory Visit (INDEPENDENT_AMBULATORY_CARE_PROVIDER_SITE_OTHER): Payer: Medicare HMO

## 2022-06-19 ENCOUNTER — Ambulatory Visit (INDEPENDENT_AMBULATORY_CARE_PROVIDER_SITE_OTHER): Payer: Medicare HMO | Admitting: Internal Medicine

## 2022-06-19 ENCOUNTER — Encounter: Payer: Self-pay | Admitting: Internal Medicine

## 2022-06-19 VITALS — BP 126/78 | HR 82 | Temp 98.6°F | Ht 62.0 in | Wt 203.0 lb

## 2022-06-19 DIAGNOSIS — R059 Cough, unspecified: Secondary | ICD-10-CM | POA: Diagnosis not present

## 2022-06-19 DIAGNOSIS — R052 Subacute cough: Secondary | ICD-10-CM | POA: Diagnosis not present

## 2022-06-19 DIAGNOSIS — J069 Acute upper respiratory infection, unspecified: Secondary | ICD-10-CM

## 2022-06-19 DIAGNOSIS — J328 Other chronic sinusitis: Secondary | ICD-10-CM

## 2022-06-19 MED ORDER — OXYMETAZOLINE HCL 0.05 % NA SOLN: 1.0000 | Freq: Two times a day (BID) | NASAL | 0 refills | Status: AC

## 2022-06-19 MED ORDER — HYDROCODONE BIT-HOMATROP MBR 5-1.5 MG/5ML PO SOLN: 5.0000 mL | Freq: Three times a day (TID) | ORAL | 0 refills | Status: DC | PRN

## 2022-06-19 MED ORDER — LEVOFLOXACIN 500 MG PO TABS: 500.0000 mg | ORAL_TABLET | Freq: Every day | ORAL | 0 refills | Status: DC

## 2022-06-19 NOTE — Assessment & Plan Note (Signed)
Worse - possible L CAP Start Ventolin MDI Hycodan prn Use Afrin nasal spray

## 2022-06-19 NOTE — Progress Notes (Signed)
Subjective:  Patient ID: Debra Atkinson, female    DOB: August 05, 1945  Age: 76 y.o. MRN: [id number]  CC: Nasal Congestion (And cough, feeling pain in side when coughing , tightness in throat , stuffy nose )   HPI Debra Atkinson presents for severe cough, congestion, congested nasal breathing  Outpatient Medications Prior to Visit  Medication Sig Dispense Refill   albuterol (VENTOLIN HFA) 108 (90 Base) MCG/ACT inhaler Inhale 2 puffs into the lungs every 6 (six) hours as needed for wheezing or shortness of breath. 8 g 0   Cholecalciferol (CVS D3) 50 MCG (2000 UT) CAPS Take by mouth.      Cyanocobalamin (CVS VITAMIN B-12 PO) Take 2,500 tablets by mouth every other day.     EQ ALLERGY RELIEF, CETIRIZINE, 10 MG tablet Take 1 tablet by mouth once daily 90 tablet 0   fluocinonide cream (LIDEX) 9.17 % Apply 1 application  topically 2 (two) times daily.     ibuprofen (ADVIL,MOTRIN) 200 MG tablet Take 200 mg by mouth every 6 (six) hours as needed.     lidocaine (LIDODERM) 5 % Place 1 patch onto the skin daily. Remove & Discard patch within 12 hours or as directed by MD 30 patch 0   Multiple Vitamins-Minerals (MULTIVITAMIN WITH MINERALS) tablet Take 1 tablet by mouth daily.     Multiple Vitamins-Minerals (PRESERVISION AREDS 2 PO) Take by mouth.     rosuvastatin (CRESTOR) 40 MG tablet Take 1 tablet by mouth once daily 90 tablet 0   triamcinolone (NASACORT AQ) 55 MCG/ACT AERO nasal inhaler Place 2 sprays into the nose daily. 3 Inhaler 3   triamcinolone cream (KENALOG) 0.1 % Apply 1 application topically 2 (two) times daily as needed. 30 g 1   azithromycin (ZITHROMAX) 250 MG tablet Take 2 tablets on day 1, then 1 tablet daily on days 2 through 5 6 tablet 0   diazepam (VALIUM) 5 MG tablet Take 1 tablet (5 mg total) by mouth every 12 (twelve) hours as needed for anxiety. 10 tablet 2   predniSONE (DELTASONE) 50 MG tablet Take 1 tablet daily for 5 days. 5 tablet 0   tiZANidine (ZANAFLEX) 4 MG tablet Take  1 tablet (4 mg total) by mouth every 6 (six) hours as needed for muscle spasms. 30 tablet 1   traMADol (ULTRAM) 50 MG tablet Take 1 tablet (50 mg total) by mouth every 6 (six) hours as needed. 30 tablet 0   No facility-administered medications prior to visit.    ROS: Review of Systems  Constitutional:  Negative for activity change, appetite change, chills, fatigue, fever and unexpected weight change.  HENT:  Positive for congestion. Negative for mouth sores and sinus pressure.   Eyes:  Negative for visual disturbance.  Respiratory:  Positive for cough. Negative for chest tightness.   Gastrointestinal:  Negative for abdominal pain and nausea.  Genitourinary:  Negative for difficulty urinating, frequency and vaginal pain.  Musculoskeletal:  Negative for back pain and gait problem.  Skin:  Negative for pallor and rash.  Neurological:  Negative for dizziness, tremors, weakness, numbness and headaches.  Psychiatric/Behavioral:  Negative for confusion and sleep disturbance.     Objective:  BP 126/78 (BP Location: Left Arm, Patient Position: Sitting, Cuff Size: Normal)   Pulse 82   Temp 98.6 F (37 C) (Oral)   Ht '5\' 2"'$  (1.575 m)   Wt 203 lb (92.1 kg)   SpO2 98%   BMI 37.13 kg/m   BP Readings from Last  3 Encounters:  06/19/22 126/78  06/14/22 124/84  05/07/22 134/72    Wt Readings from Last 3 Encounters:  06/19/22 203 lb (92.1 kg)  06/14/22 203 lb 6.4 oz (92.3 kg)  05/07/22 203 lb 8 oz (92.3 kg)    Physical Exam Constitutional:      General: She is not in acute distress.    Appearance: She is well-developed.  HENT:     Head: Normocephalic.     Right Ear: External ear normal.     Left Ear: External ear normal.     Nose: Nose normal.  Eyes:     General:        Right eye: No discharge.        Left eye: No discharge.     Conjunctiva/sclera: Conjunctivae normal.     Pupils: Pupils are equal, round, and reactive to light.  Neck:     Thyroid: No thyromegaly.     Vascular:  No JVD.     Trachea: No tracheal deviation.  Cardiovascular:     Rate and Rhythm: Normal rate and regular rhythm.     Heart sounds: Normal heart sounds.  Pulmonary:     Effort: No respiratory distress.     Breath sounds: No stridor. No wheezing.  Abdominal:     General: Bowel sounds are normal. There is no distension.     Palpations: Abdomen is soft. There is no mass.     Tenderness: There is no abdominal tenderness. There is no guarding or rebound.  Musculoskeletal:        General: No tenderness.     Cervical back: Normal range of motion and neck supple. No rigidity.  Lymphadenopathy:     Cervical: No cervical adenopathy.  Skin:    Findings: No erythema or rash.  Neurological:     Cranial Nerves: No cranial nerve deficit.     Motor: No abnormal muscle tone.     Coordination: Coordination normal.     Deep Tendon Reflexes: Reflexes normal.  Psychiatric:        Behavior: Behavior normal.        Thought Content: Thought content normal.        Judgment: Judgment normal.   Rhonchi at the left base Lab Results  Component Value Date   WBC 6.6 01/16/2022   HGB 12.8 01/16/2022   HCT 38.7 01/16/2022   PLT 314.0 01/16/2022   GLUCOSE 112 (H) 01/16/2022   CHOL 151 01/16/2022   TRIG 87.0 01/16/2022   HDL 55.70 01/16/2022   LDLDIRECT 80.0 07/17/2021   LDLCALC 78 01/16/2022   ALT 26 01/16/2022   AST 19 01/16/2022   NA 140 01/16/2022   K 4.6 01/16/2022   CL 104 01/16/2022   CREATININE 0.77 01/16/2022   BUN 18 01/16/2022   CO2 29 01/16/2022   TSH 2.10 01/16/2022   INR 0.92 05/24/2011   HGBA1C 6.6 (H) 01/16/2022   MICROALBUR 0.8 01/16/2022    [place] FNA BX THYROID 1ST LESION AFIRMA  Result Date: 08/10/2021 INDICATION: Indeterminate thyroid nodule EXAM: ULTRASOUND GUIDED FINE NEEDLE ASPIRATION OF INDETERMINATE THYROID NODULE COMPARISON:  07/27/21 MEDICATIONS: None COMPLICATIONS: None immediate. TECHNIQUE: Informed written consent was obtained from the patient after a discussion of the  risks, benefits and alternatives to treatment. Questions regarding the procedure were encouraged and answered. A timeout was performed prior to the initiation of the procedure. Pre-procedural ultrasound scanning demonstrated unchanged size and appearance of the indeterminate nodule within the right superior lobe The procedure was planned.  The neck was prepped in the usual sterile fashion, and a sterile drape was applied covering the operative field. A timeout was performed prior to the initiation of the procedure. Local anesthesia was provided with 1% lidocaine. Under direct ultrasound guidance, 5 FNA biopsies were performed of the nodule with a 27 gauge needle. Multiple ultrasound images were saved for procedural documentation purposes. The samples were prepared and submitted to pathology. Two of these specimens were reserved for Afirma testing. Limited post procedural scanning was negative for hematoma or additional complication. Dressings were placed. The patient tolerated the above procedures procedure well without immediate postprocedural complication. FINDINGS: Nodule reference number based on prior diagnostic ultrasound: 2 Maximum size: 2.4cm Location: Right; Mid ACR TI-RADS risk category: TR3 (3 points) Reason for biopsy: meets ACR TI-RADS criteria Ultrasound imaging confirms appropriate placement of the needles within the thyroid nodule. IMPRESSION: Technically successful ultrasound guided fine needle aspiration of thyroid nodule as described above Performed and read by Pasty Spillers, PA-C Electronically Signed   By: Jerilynn Mages.  Shick M.D.   On: 08/10/2021 16:11    Assessment & Plan:   Problem List Items Addressed This Visit     URI (upper respiratory infection)    Not better.  Clinically a bronchial pneumonia.  Start Levaquin.  Obtain chest x-ray.  Hycodan cough syrup. Follow-up with Dr. [name] Reichmann      Sinusitis, chronic    Worse.  Start Levaquin      Relevant Medications   levofloxacin (LEVAQUIN)  500 MG tablet   HYDROcodone bit-homatropine (HYCODAN) 5-1.5 MG/5ML syrup   oxymetazoline (AFRIN NASAL SPRAY) 0.05 % nasal spray   Cough - Primary    Worse - possible L CAP Start Ventolin MDI Hycodan prn Use Afrin nasal spray        Relevant Orders   DG Chest 2 View (Completed)      Meds ordered this encounter  Medications   levofloxacin (LEVAQUIN) 500 MG tablet    Sig: Take 1 tablet (500 mg total) by mouth daily.    Dispense:  10 tablet    Refill:  0   HYDROcodone bit-homatropine (HYCODAN) 5-1.5 MG/5ML syrup    Sig: Take 5 mLs by mouth every 8 (eight) hours as needed for cough.    Dispense:  240 mL    Refill:  0   oxymetazoline (AFRIN NASAL SPRAY) 0.05 % nasal spray    Sig: Place 1 spray into both nostrils 2 (two) times daily for 7 days.    Dispense:  30 mL    Refill:  0      Follow-up: Return in about 2 weeks (around 07/03/2022) for a follow-up visit.  Walker Kehr, MD

## 2022-06-26 NOTE — Assessment & Plan Note (Signed)
Worse.  Start Levaquin

## 2022-06-26 NOTE — Assessment & Plan Note (Addendum)
Not better.  Clinically a bronchial pneumonia.  Start Levaquin.  Obtain chest x-ray.  Hycodan cough syrup. Follow-up with Dr. [name] Reichmann

## 2022-06-29 ENCOUNTER — Telehealth: Payer: Self-pay

## 2022-06-29 ENCOUNTER — Ambulatory Visit (INDEPENDENT_AMBULATORY_CARE_PROVIDER_SITE_OTHER): Payer: Medicare HMO | Admitting: Internal Medicine

## 2022-06-29 ENCOUNTER — Encounter: Payer: Self-pay | Admitting: Internal Medicine

## 2022-06-29 VITALS — BP 124/72 | HR 95 | Temp 98.2°F | Ht 62.0 in | Wt 202.0 lb

## 2022-06-29 DIAGNOSIS — J069 Acute upper respiratory infection, unspecified: Secondary | ICD-10-CM | POA: Diagnosis not present

## 2022-06-29 DIAGNOSIS — R058 Other specified cough: Secondary | ICD-10-CM | POA: Diagnosis not present

## 2022-06-29 MED ORDER — HYDROCODONE BIT-HOMATROP MBR 5-1.5 MG/5ML PO SOLN: 5.0000 mL | Freq: Three times a day (TID) | ORAL | 0 refills | Status: DC | PRN

## 2022-06-29 MED ORDER — PSEUDOEPHEDRINE HCL ER 120 MG PO TB12: 120.0000 mg | ORAL_TABLET | Freq: Two times a day (BID) | ORAL | 0 refills | Status: DC | PRN

## 2022-06-29 MED ORDER — TRIAMCINOLONE ACETONIDE 55 MCG/ACT NA AERO: 2.0000 | INHALATION_SPRAY | Freq: Every day | NASAL | 2 refills | Status: DC

## 2022-06-29 NOTE — Assessment & Plan Note (Signed)
Improving slowly Hycodan prn renewed Sudafed Rx for ongoing congestion Use Afrin, start Nasacort

## 2022-06-29 NOTE — Assessment & Plan Note (Signed)
Improving slowly Hycodan prn renewed  Sudafed Rx for ongoing congestion Use Afrin, start Nasacort F/u w/Dr [name] Reichmann

## 2022-06-29 NOTE — Telephone Encounter (Signed)
Pt has stated she is till having nasal congestion. Pt states when she coughs she still feels like its mucus in her chest that is not coming up. I was able to get the pt a same day apptmnt as she wanted a message sent then asked for an apptmnt instead for today.

## 2022-06-29 NOTE — Progress Notes (Signed)
Subjective:  Patient ID: Debra Atkinson, female    DOB: [birth year]  Age: 76 y.o. MRN: [id number]  CC: Nasal Congestion (Congestion in chest and can't cough it out, feels like drainage also having right foot pain. Would like to look at gash between eyes)   HPI Debra Atkinson presents for congested nose, nasal d/c,spastic cough, L ear noise  Outpatient Medications Prior to Visit  Medication Sig Dispense Refill   albuterol (VENTOLIN HFA) 108 (90 Base) MCG/ACT inhaler Inhale 2 puffs into the lungs every 6 (six) hours as needed for wheezing or shortness of breath. 8 g 0   Cholecalciferol (CVS D3) 50 MCG (2000 UT) CAPS Take by mouth.      Cyanocobalamin (CVS VITAMIN B-12 PO) Take 2,500 tablets by mouth every other day.     EQ ALLERGY RELIEF, CETIRIZINE, 10 MG tablet Take 1 tablet by mouth once daily 90 tablet 0   ibuprofen (ADVIL,MOTRIN) 200 MG tablet Take 200 mg by mouth every 6 (six) hours as needed.     Multiple Vitamins-Minerals (MULTIVITAMIN WITH MINERALS) tablet Take 1 tablet by mouth daily.     Multiple Vitamins-Minerals (PRESERVISION AREDS 2 PO) Take by mouth.     rosuvastatin (CRESTOR) 40 MG tablet Take 1 tablet by mouth once daily 90 tablet 0   triamcinolone cream (KENALOG) 0.1 % Apply 1 application topically 2 (two) times daily as needed. 30 g 1   HYDROcodone bit-homatropine (HYCODAN) 5-1.5 MG/5ML syrup Take 5 mLs by mouth every 8 (eight) hours as needed for cough. 240 mL 0   fluocinonide cream (LIDEX) 3.82 % Apply 1 application  topically 2 (two) times daily. (Patient not taking: Reported on 06/29/2022)     levofloxacin (LEVAQUIN) 500 MG tablet Take 1 tablet (500 mg total) by mouth daily. (Patient not taking: Reported on 06/29/2022) 10 tablet 0   lidocaine (LIDODERM) 5 % Place 1 patch onto the skin daily. Remove & Discard patch within 12 hours or as directed by MD (Patient not taking: Reported on 06/29/2022) 30 patch 0   triamcinolone (NASACORT AQ) 55 MCG/ACT AERO nasal inhaler  Place 2 sprays into the nose daily. (Patient not taking: Reported on 06/29/2022) 3 Inhaler 3   No facility-administered medications prior to visit.    ROS: Review of Systems  Objective:  BP 124/72 (BP Location: Left Arm, Patient Position: Sitting, Cuff Size: Normal)   Pulse 95   Temp 98.2 F (36.8 C) (Oral)   Ht '5\' 2"'$  (1.575 m)   Wt 202 lb (91.6 kg)   SpO2 98%   BMI 36.95 kg/m   BP Readings from Last 3 Encounters:  06/29/22 124/72  06/19/22 126/78  06/14/22 124/84    Wt Readings from Last 3 Encounters:  06/29/22 202 lb (91.6 kg)  06/19/22 203 lb (92.1 kg)  06/14/22 203 lb 6.4 oz (92.3 kg)    Physical Exam  Lab Results  Component Value Date   WBC 6.6 01/16/2022   HGB 12.8 01/16/2022   HCT 38.7 01/16/2022   PLT 314.0 01/16/2022   GLUCOSE 112 (H) 01/16/2022   CHOL 151 01/16/2022   TRIG 87.0 01/16/2022   HDL 55.70 01/16/2022   LDLDIRECT 80.0 07/17/2021   LDLCALC 78 01/16/2022   ALT 26 01/16/2022   AST 19 01/16/2022   NA 140 01/16/2022   K 4.6 01/16/2022   CL 104 01/16/2022   CREATININE 0.77 01/16/2022   BUN 18 01/16/2022   CO2 29 01/16/2022   TSH 2.10 01/16/2022   INR  0.92 05/24/2011   HGBA1C 6.6 (H) 01/16/2022   MICROALBUR 0.8 01/16/2022    [place] FNA BX THYROID 1ST LESION AFIRMA  Result Date: 08/10/2021 INDICATION: Indeterminate thyroid nodule EXAM: ULTRASOUND GUIDED FINE NEEDLE ASPIRATION OF INDETERMINATE THYROID NODULE COMPARISON:  07/27/21 MEDICATIONS: None COMPLICATIONS: None immediate. TECHNIQUE: Informed written consent was obtained from the patient after a discussion of the risks, benefits and alternatives to treatment. Questions regarding the procedure were encouraged and answered. A timeout was performed prior to the initiation of the procedure. Pre-procedural ultrasound scanning demonstrated unchanged size and appearance of the indeterminate nodule within the right superior lobe The procedure was planned. The neck was prepped in the usual sterile  fashion, and a sterile drape was applied covering the operative field. A timeout was performed prior to the initiation of the procedure. Local anesthesia was provided with 1% lidocaine. Under direct ultrasound guidance, 5 FNA biopsies were performed of the nodule with a 27 gauge needle. Multiple ultrasound images were saved for procedural documentation purposes. The samples were prepared and submitted to pathology. Two of these specimens were reserved for Afirma testing. Limited post procedural scanning was negative for hematoma or additional complication. Dressings were placed. The patient tolerated the above procedures procedure well without immediate postprocedural complication. FINDINGS: Nodule reference number based on prior diagnostic ultrasound: 2 Maximum size: 2.4cm Location: Right; Mid ACR TI-RADS risk category: TR3 (3 points) Reason for biopsy: meets ACR TI-RADS criteria Ultrasound imaging confirms appropriate placement of the needles within the thyroid nodule. IMPRESSION: Technically successful ultrasound guided fine needle aspiration of thyroid nodule as described above Performed and read by Pasty Spillers, PA-C Electronically Signed   By: Jerilynn Mages.  Shick M.D.   On: 08/10/2021 16:11    Assessment & Plan:   Problem List Items Addressed This Visit     URI (upper respiratory infection) - Primary    Improving slowly Hycodan prn renewed Sudafed Rx for ongoing congestion Use Afrin, start Nasacort        Upper airway cough syndrome    Improving slowly Hycodan prn renewed  Sudafed Rx for ongoing congestion Use Afrin, start Nasacort F/u w/Dr [name] Reichmann         Meds ordered this encounter  Medications   pseudoephedrine (SUDAFED) 120 MG 12 hr tablet    Sig: Take 1 tablet (120 mg total) by mouth 2 (two) times daily as needed for congestion.    Dispense:  60 tablet    Refill:  0   triamcinolone (NASACORT) 55 MCG/ACT AERO nasal inhaler    Sig: Place 2 sprays into the nose daily.    Dispense:  1  each    Refill:  2   HYDROcodone bit-homatropine (HYCODAN) 5-1.5 MG/5ML syrup    Sig: Take 5 mLs by mouth every 8 (eight) hours as needed for cough.    Dispense:  240 mL    Refill:  0      Follow-up: Return in about 6 weeks (around 08/10/2022) for a follow-up visit.  Walker Kehr, MD

## 2022-07-04 ENCOUNTER — Ambulatory Visit (INDEPENDENT_AMBULATORY_CARE_PROVIDER_SITE_OTHER): Payer: Medicare HMO | Admitting: Internal Medicine

## 2022-07-04 VITALS — BP 120/68 | HR 78 | Temp 98.9°F | Ht 62.0 in | Wt 198.1 lb

## 2022-07-04 DIAGNOSIS — M79604 Pain in right leg: Secondary | ICD-10-CM | POA: Diagnosis not present

## 2022-07-04 DIAGNOSIS — R7303 Prediabetes: Secondary | ICD-10-CM

## 2022-07-04 DIAGNOSIS — E559 Vitamin D deficiency, unspecified: Secondary | ICD-10-CM

## 2022-07-04 DIAGNOSIS — R052 Subacute cough: Secondary | ICD-10-CM | POA: Diagnosis not present

## 2022-07-04 DIAGNOSIS — M79671 Pain in right foot: Secondary | ICD-10-CM | POA: Diagnosis not present

## 2022-07-04 DIAGNOSIS — E538 Deficiency of other specified B group vitamins: Secondary | ICD-10-CM | POA: Diagnosis not present

## 2022-07-04 DIAGNOSIS — E7800 Pure hypercholesterolemia, unspecified: Secondary | ICD-10-CM | POA: Diagnosis not present

## 2022-07-04 LAB — CBC WITH DIFFERENTIAL/PLATELET
Basophils Absolute: 0 10*3/uL (ref 0.0–0.1)
Basophils Relative: 0.6 % (ref 0.0–3.0)
Eosinophils Absolute: 0.1 10*3/uL (ref 0.0–0.7)
Eosinophils Relative: 1.9 % (ref 0.0–5.0)
HCT: 39.6 % (ref 36.0–46.0)
Hemoglobin: 13.1 g/dL (ref 12.0–15.0)
Lymphocytes Relative: 44.2 % (ref 12.0–46.0)
Lymphs Abs: 2.6 10*3/uL (ref 0.7–4.0)
MCHC: 33 g/dL (ref 30.0–36.0)
MCV: 82.5 fl (ref 78.0–100.0)
Monocytes Absolute: 0.6 10*3/uL (ref 0.1–1.0)
Monocytes Relative: 10.4 % (ref 3.0–12.0)
Neutro Abs: 2.5 10*3/uL (ref 1.4–7.7)
Neutrophils Relative %: 42.9 % — ABNORMAL LOW (ref 43.0–77.0)
Platelets: 289 10*3/uL (ref 150.0–400.0)
RBC: 4.79 Mil/uL (ref 3.87–5.11)
RDW: 14.3 % (ref 11.5–15.5)
WBC: 5.9 10*3/uL (ref 4.0–10.5)

## 2022-07-04 LAB — BASIC METABOLIC PANEL
BUN: 16 mg/dL (ref 6–23)
CO2: 29 mEq/L (ref 19–32)
Calcium: 9.8 mg/dL (ref 8.4–10.5)
Chloride: 103 mEq/L (ref 96–112)
Creatinine, Ser: 0.79 mg/dL (ref 0.40–1.20)
GFR: 72.69 mL/min (ref >60.00)
Glucose, Bld: 113 mg/dL — ABNORMAL HIGH (ref 70–99)
Potassium: 4.1 mEq/L (ref 3.5–5.1)
Sodium: 141 mEq/L (ref 135–145)

## 2022-07-04 LAB — URINALYSIS, ROUTINE W REFLEX MICROSCOPIC
Bilirubin Urine: NEGATIVE
Hgb urine dipstick: NEGATIVE
Ketones, ur: NEGATIVE
Leukocytes,Ua: NEGATIVE
Nitrite: NEGATIVE
Specific Gravity, Urine: 1.025 (ref 1.000–1.030)
Total Protein, Urine: NEGATIVE
Urine Glucose: NEGATIVE
Urobilinogen, UA: 0.2 (ref 0.0–1.0)
pH: 6 (ref 5.0–8.0)

## 2022-07-04 LAB — HEPATIC FUNCTION PANEL
ALT: 20 U/L (ref 0–35)
AST: 19 U/L (ref 0–37)
Albumin: 4.2 g/dL (ref 3.5–5.2)
Alkaline Phosphatase: 78 U/L (ref 39–117)
Bilirubin, Direct: 0.2 mg/dL (ref 0.0–0.3)
Total Bilirubin: 0.6 mg/dL (ref 0.2–1.2)
Total Protein: 7.1 g/dL (ref 6.0–8.3)

## 2022-07-04 LAB — VITAMIN B12: Vitamin B-12: 779 pg/mL (ref 211–911)

## 2022-07-04 LAB — MICROALBUMIN / CREATININE URINE RATIO
Creatinine,U: 144.8 mg/dL
Microalb Creat Ratio: 1.5 mg/g (ref 0.0–30.0)
Microalb, Ur: 2.2 mg/dL — ABNORMAL HIGH (ref 0.0–1.9)

## 2022-07-04 LAB — VITAMIN D 25 HYDROXY (VIT D DEFICIENCY, FRACTURES): VITD: 49.01 ng/mL (ref 30.00–100.00)

## 2022-07-04 LAB — LIPID PANEL
Cholesterol: 137 mg/dL (ref 0–200)
HDL: 49.6 mg/dL (ref >39.00)
LDL Cholesterol: 60 mg/dL (ref 0–99)
NonHDL: 87.51
Total CHOL/HDL Ratio: 3
Triglycerides: 136 mg/dL (ref 0.0–149.0)
VLDL: 27.2 mg/dL (ref 0.0–40.0)

## 2022-07-04 LAB — TSH: TSH: 2.22 u[IU]/mL (ref 0.35–5.50)

## 2022-07-04 LAB — HEMOGLOBIN A1C: Hgb A1c MFr Bld: 6.7 % — ABNORMAL HIGH (ref 4.6–6.5)

## 2022-07-04 MED ORDER — CELECOXIB 200 MG PO CAPS: 200.0000 mg | ORAL_CAPSULE | Freq: Two times a day (BID) | ORAL | 1 refills | Status: DC | PRN

## 2022-07-04 MED ORDER — HYDROCODONE-ACETAMINOPHEN 5-325 MG PO TABS: 1.0000 | ORAL_TABLET | Freq: Four times a day (QID) | ORAL | 0 refills | Status: DC | PRN

## 2022-07-04 NOTE — Patient Instructions (Addendum)
Please take all new medication as prescribed - the celebrex (anti inflammatory) as needed, and hydrocodone for severe breakthrough pain  Please continue all other medications as before, and refills have been done if requested.  Please have the pharmacy call with any other refills you may need.  Please continue your efforts at being more active, low cholesterol diet, and weight control.  Please keep your appointments with your specialists as you may have planned  You will be contacted regarding the referral for: Sports Medicine  Please go to the LAB at the blood drawing area for the tests to be done  You will be contacted by phone if any changes need to be made immediately.  Otherwise, you will receive a letter about your results with an explanation, but please check with MyChart first.  Please remember to sign up for MyChart if you have not done so, as this will be important to you in the future with finding out test results, communicating by private email, and scheduling acute appointments online when needed.  Please make an Appointment to return in Jan 18, or sooner if needed

## 2022-07-04 NOTE — Progress Notes (Signed)
Patient ID: Debra Atkinson, female   DOB: [birth year], 77 y.o.   MRN: [id number]        Chief Complaint: follow up recent sinus infection, allergies, right knee and leg pain, low b12       HPI:  Debra Atkinson is a 77 y.o. female here with c/o above, with 1 wk onset unusual pain and tender soreness to the right posteromedial knee but no other knee swelling, pain ro calf pain or swelling.  Worse to stand and walk, better to sit. Now mild to mod, limps somewhat to walk, which has been more lately due to wrosening right foot plantar pain, worse in the AM for the last few wks.  No worsening lower back pain,  Pt denies chest pain, increased sob or doe, wheezing, orthopnea, PND, increased LE swelling, palpitations, dizziness or syncope.   Pt denies polydipsia, polyuria, or new focal neuro s/s. Has some persistent non prod cough but seems to be improving, has hycodan for home prn use.    Wt Readings from Last 3 Encounters:  07/04/22 198 lb 2 oz (89.9 kg)  06/29/22 202 lb (91.6 kg)  06/19/22 203 lb (92.1 kg)   BP Readings from Last 3 Encounters:  07/04/22 120/68  06/29/22 124/72  06/19/22 126/78         Past Medical History:  Diagnosis Date   Cerebrovascular disease 02/05/2019   Concussion '06, '11   Diabetes mellitus    diet management   Family history of colon cancer 02/28/2016   Fibromyalgia 10/08/2013   Hyperlipidemia    diet managed   Varicella    Past Surgical History:  Procedure Laterality Date   [place]   laparotomy    reports that she has never smoked. She has never used smokeless tobacco. She reports that she does not drink alcohol and does not use drugs. family history includes COPD in her mother; Cancer (age of onset: 72) in her mother; Diabetes in her maternal grandfather and mother; Heart disease in her father and mother; Rheumatic fever in her father. Allergies  Allergen Reactions   Codeine Other (See Comments)    Methylprednisolone Itching   Current Outpatient Medications on File Prior to Visit  Medication Sig Dispense Refill   albuterol (VENTOLIN HFA) 108 (90 Base) MCG/ACT inhaler Inhale 2 puffs into the lungs every 6 (six) hours as needed for wheezing or shortness of breath. 8 g 0   Cholecalciferol (CVS D3) 50 MCG (2000 UT) CAPS Take by mouth.      Cyanocobalamin (CVS VITAMIN B-12 PO) Take 2,500 tablets by mouth every other day.     EQ ALLERGY RELIEF, CETIRIZINE, 10 MG tablet Take 1 tablet by mouth once daily 90 tablet 0   fluocinonide cream (LIDEX) 7.82 % Apply 1 application  topically 2 (two) times daily.     HYDROcodone bit-homatropine (HYCODAN) 5-1.5 MG/5ML syrup Take 5 mLs by mouth every 8 (eight) hours as needed for cough. 240 mL 0   levofloxacin (LEVAQUIN) 500 MG tablet Take 1 tablet (500 mg total) by mouth daily. 10 tablet 0   lidocaine (LIDODERM) 5 % Place 1 patch onto the skin daily. Remove & Discard patch within 12 hours or as directed by MD 30 patch 0   Multiple Vitamins-Minerals (MULTIVITAMIN WITH MINERALS) tablet Take 1 tablet by mouth daily.     Multiple Vitamins-Minerals (PRESERVISION AREDS 2 PO) Take by mouth.  pseudoephedrine (SUDAFED) 120 MG 12 hr tablet Take 1 tablet (120 mg total) by mouth 2 (two) times daily as needed for congestion. 60 tablet 0   rosuvastatin (CRESTOR) 40 MG tablet Take 1 tablet by mouth once daily 90 tablet 0   triamcinolone (NASACORT) 55 MCG/ACT AERO nasal inhaler Place 2 sprays into the nose daily. 1 each 2   triamcinolone cream (KENALOG) 0.1 % Apply 1 application topically 2 (two) times daily as needed. 30 g 1   No current facility-administered medications on file prior to visit.        ROS:  All others reviewed and negative.  Objective        PE:  BP 120/68   Pulse 78   Temp 98.9 F (37.2 C) (Oral)   Ht '5\' 2"'$  (1.575 m)   Wt 198 lb 2 oz (89.9 kg)   SpO2 93%   BMI 36.24 kg/m                 Constitutional: Pt appears in NAD                HENT: Head: NCAT.                Right Ear: External ear normal.                 Left Ear: External ear normal.                Eyes: . Pupils are equal, round, and reactive to light. Conjunctivae and EOM are normal               Nose: without d/c or deformity               Neck: Neck supple. Gross normal ROM               Cardiovascular: Normal rate and regular rhythm.                 Pulmonary/Chest: Effort normal and breath sounds without rales or wheezing.                Abd:  Soft, NT, ND, + BS, no organomegaly               Neurological: Pt is alert. At baseline orientation, motor grossly intact               Skin: Skin is warm. No rashes, no other new lesions, LE edema - none, has tender right distal plantar foot and instep without swelling or redness or ulcer; also tender right medial gastroc tendon insertion site               Psychiatric: Pt behavior is normal without agitation   Micro: none  Cardiac tracings I have personally interpreted today:  none  Pertinent Radiological findings (summarize): none   Lab Results  Component Value Date   WBC 5.9 07/04/2022   HGB 13.1 07/04/2022   HCT 39.6 07/04/2022   PLT 289.0 07/04/2022   GLUCOSE 113 (H) 07/04/2022   CHOL 137 07/04/2022   TRIG 136.0 07/04/2022   HDL 49.60 07/04/2022   LDLDIRECT 80.0 07/17/2021   LDLCALC 60 07/04/2022   ALT 20 07/04/2022   AST 19 07/04/2022   NA 141 07/04/2022   K 4.1 07/04/2022   CL 103 07/04/2022   CREATININE 0.79 07/04/2022   BUN 16 07/04/2022   CO2 29 07/04/2022   TSH 2.22 07/04/2022   INR 0.92 05/24/2011  HGBA1C 6.7 (H) 07/04/2022   MICROALBUR 2.2 (H) 07/04/2022   Assessment/Plan:  Debra Atkinson is a 77 y.o. White or Caucasian [1] female with  has a past medical history of Cerebrovascular disease (02/05/2019), Concussion ('06, '11), Diabetes mellitus, Family history of colon cancer (02/28/2016), Fibromyalgia (10/08/2013), Hyperlipidemia, and Varicella.  B12 deficiency Lab Results   Component Value Date   [id number] 07/04/2022   Stable, cont oral replacement - b12 1000 mcg qd   Hyperlipidemia Lab Results  Component Value Date   LDLCALC 60 07/04/2022   Stable, pt to continue current statin crestor 40 mg qd   Pre-diabetes Lab Results  Component Value Date   HGBA1C 6.7 (H) 07/04/2022   Stable, pt to continue current medical treatment  - diet, wt control, declines OHA for now   Vitamin D deficiency Last vitamin D Lab Results  Component Value Date   VD25OH 49.01 07/04/2022   Stable, cont oral replacement   Cough Etiology unclear, possibly improving recent viral illness, cont hycodan prn  Right foot pain C/w plantar fasciitis, for celebrex bid prn, refer sports medicine  Right leg pain C/w likely medial gastroc tendonitis, mod to occas severe, for limited vicodin prn breakthrough pain, f/u sport medicine  Followup: Return in about 15 days (around 07/19/2022).  Cathlean Cower, MD 07/07/2022 7:51 PM [facility]

## 2022-07-07 ENCOUNTER — Encounter: Payer: Self-pay | Admitting: Internal Medicine

## 2022-07-07 NOTE — Assessment & Plan Note (Signed)
C/w likely medial gastroc tendonitis, mod to occas severe, for limited vicodin prn breakthrough pain, f/u sport medicine

## 2022-07-07 NOTE — Assessment & Plan Note (Signed)
Lab Results  Component Value Date   HGBA1C 6.7 (H) 07/04/2022   Stable, pt to continue current medical treatment  - diet, wt control, declines OHA for now

## 2022-07-07 NOTE — Assessment & Plan Note (Signed)
Lab Results  Component Value Date   [id number] 07/04/2022   Stable, cont oral replacement - b12 1000 mcg qd

## 2022-07-07 NOTE — Assessment & Plan Note (Signed)
Last vitamin D Lab Results  Component Value Date   VD25OH 49.01 07/04/2022   Stable, cont oral replacement

## 2022-07-07 NOTE — Assessment & Plan Note (Signed)
Etiology unclear, possibly improving recent viral illness, cont hycodan prn

## 2022-07-07 NOTE — Assessment & Plan Note (Signed)
Lab Results  Component Value Date   LDLCALC 60 07/04/2022   Stable, pt to continue current statin crestor 40 mg qd

## 2022-07-07 NOTE — Assessment & Plan Note (Signed)
C/w plantar fasciitis, for celebrex bid prn, refer sports medicine

## 2022-07-10 ENCOUNTER — Ambulatory Visit: Payer: Medicare HMO | Admitting: Sports Medicine

## 2022-07-11 NOTE — Progress Notes (Unsigned)
    Benito Mccreedy D.[facility] Phone: [phone]   Assessment and Plan:     There are no diagnoses linked to this encounter.  ***   Pertinent previous records reviewed include ***   Follow Up: ***     Subjective:   I, Artis Beggs, am serving as a Education administrator for Doctor Glennon Mac  Chief Complaint: right leg and foot pain   HPI:   07/12/2022 Patient is 77 year old female complaining of right leg and foot pain . Patient states   Relevant Historical Information: ***  Additional pertinent review of systems negative.   Current Outpatient Medications:    albuterol (VENTOLIN HFA) 108 (90 Base) MCG/ACT inhaler, Inhale 2 puffs into the lungs every 6 (six) hours as needed for wheezing or shortness of breath., Disp: 8 g, Rfl: 0   celecoxib (CELEBREX) 200 MG capsule, Take 1 capsule (200 mg total) by mouth 2 (two) times daily as needed., Disp: 180 capsule, Rfl: 1   Cholecalciferol (CVS D3) 80 MCG (2000 UT) CAPS, Take by mouth. , Disp: , Rfl:    Cyanocobalamin (CVS VITAMIN B-12 PO), Take 2,500 tablets by mouth every other day., Disp: , Rfl:    EQ ALLERGY RELIEF, CETIRIZINE, 10 MG tablet, Take 1 tablet by mouth once daily, Disp: 90 tablet, Rfl: 0   fluocinonide cream (LIDEX) 0.25 %, Apply 1 application  topically 2 (two) times daily., Disp: , Rfl:    HYDROcodone bit-homatropine (HYCODAN) 5-1.5 MG/5ML syrup, Take 5 mLs by mouth every 8 (eight) hours as needed for cough., Disp: 240 mL, Rfl: 0   HYDROcodone-acetaminophen (NORCO/VICODIN) 5-325 MG tablet, Take 1 tablet by mouth every 6 (six) hours as needed., Disp: 30 tablet, Rfl: 0   levofloxacin (LEVAQUIN) 500 MG tablet, Take 1 tablet (500 mg total) by mouth daily., Disp: 10 tablet, Rfl: 0   lidocaine (LIDODERM) 5 %, Place 1 patch onto the skin daily. Remove & Discard patch within 12 hours or as directed by MD, Disp: 30 patch, Rfl: 0   Multiple Vitamins-Minerals  (MULTIVITAMIN WITH MINERALS) tablet, Take 1 tablet by mouth daily., Disp: , Rfl:    Multiple Vitamins-Minerals (PRESERVISION AREDS 2 PO), Take by mouth., Disp: , Rfl:    pseudoephedrine (SUDAFED) 120 MG 12 hr tablet, Take 1 tablet (120 mg total) by mouth 2 (two) times daily as needed for congestion., Disp: 60 tablet, Rfl: 0   rosuvastatin (CRESTOR) 40 MG tablet, Take 1 tablet by mouth once daily, Disp: 90 tablet, Rfl: 0   triamcinolone (NASACORT) 55 MCG/ACT AERO nasal inhaler, Place 2 sprays into the nose daily., Disp: 1 each, Rfl: 2   triamcinolone cream (KENALOG) 0.1 %, Apply 1 application topically 2 (two) times daily as needed., Disp: 30 g, Rfl: 1   Objective:     There were no vitals filed for this visit.    There is no height or weight on file to calculate BMI.    Physical Exam:    ***   Electronically signed by:  Benito Mccreedy D.[facility] 4:23 PM 07/11/22

## 2022-07-12 ENCOUNTER — Ambulatory Visit (INDEPENDENT_AMBULATORY_CARE_PROVIDER_SITE_OTHER): Payer: Medicare HMO | Admitting: Sports Medicine

## 2022-07-12 VITALS — BP 132/78 | HR 84 | Ht 62.0 in | Wt 198.0 lb

## 2022-07-12 DIAGNOSIS — M79671 Pain in right foot: Secondary | ICD-10-CM

## 2022-07-12 DIAGNOSIS — M25561 Pain in right knee: Secondary | ICD-10-CM

## 2022-07-12 NOTE — Patient Instructions (Addendum)
Good to see you  Continue Celebrex 200 mg 2 x time a day two weeks and then stop  Tylenol breakthrough pain  Use heat for breakthrough pain  Knee and ankle HEP  3-4 week follow up

## 2022-07-19 ENCOUNTER — Ambulatory Visit (INDEPENDENT_AMBULATORY_CARE_PROVIDER_SITE_OTHER): Payer: Medicare HMO | Admitting: Internal Medicine

## 2022-07-19 VITALS — BP 122/70 | HR 70 | Temp 98.1°F | Ht 62.0 in | Wt 199.0 lb

## 2022-07-19 DIAGNOSIS — Z0001 Encounter for general adult medical examination with abnormal findings: Secondary | ICD-10-CM

## 2022-07-19 DIAGNOSIS — E041 Nontoxic single thyroid nodule: Secondary | ICD-10-CM

## 2022-07-19 DIAGNOSIS — J309 Allergic rhinitis, unspecified: Secondary | ICD-10-CM

## 2022-07-19 DIAGNOSIS — M779 Enthesopathy, unspecified: Secondary | ICD-10-CM | POA: Diagnosis not present

## 2022-07-19 DIAGNOSIS — R7303 Prediabetes: Secondary | ICD-10-CM | POA: Diagnosis not present

## 2022-07-19 DIAGNOSIS — E7800 Pure hypercholesterolemia, unspecified: Secondary | ICD-10-CM | POA: Diagnosis not present

## 2022-07-19 DIAGNOSIS — E559 Vitamin D deficiency, unspecified: Secondary | ICD-10-CM

## 2022-07-19 NOTE — Patient Instructions (Signed)
Ok to take the OTC Nasacort for the allergies and congestion  Ok to continue the Sudafed if your BP is ok  Please continue all other medications as before, and refills have been done if requested.  Please have the pharmacy call with any other refills you may need.  Please continue your efforts at being more active, low cholesterol diet, and weight control.  You are otherwise up to date with prevention measures today.  Please keep your appointments with your specialists as you may have planned  You will be contacted regarding the referral for: thyroid ultrasound  Please make an Appointment to return in 6 months, or sooner if needed, also with Lab Appointment for testing done 3-5 days before at the [facility] (so this is for TWO appointments - please see the scheduling desk as you leave)

## 2022-07-19 NOTE — Progress Notes (Signed)
Patient ID: Debra Atkinson, female   DOB: [birth year], 77 y.o.   MRN: [id number]         Chief Complaint:: wellness exam and 63mofollow up (Knot on palm of hand possible on both hands), ringing of both ears, and Possible knot in throat  , allergies, hyperglycemia       HPI:  Debra Atkinson is a 77y.o. female here for wellness exam; for shingrix at pharmacy, declines flu shot, o/w up to date                        Also c/o new osnet 2 mo tender knot to palm of hand, uses hands a lot every day for her ADLs and other activities; Has had several rounds of prednisone taper in the past 2 months, wt fortunately not sigficantly gained.  Pt denies chest pain, increased sob or doe, wheezing, orthopnea, PND, increased LE swelling, palpitations, dizziness or syncope.   Pt denies polydipsia, polyuria, or new focal neuro s/s.   Does have possible right thyroid/isthmus nodule enlarging due for f/u u/s.  Denies hyper or hypo thyroid symptoms such as voice, skin or hair change.  Does have several wks ongoing nasal allergy symptoms with clearish congestion, itch and sneezing, without fever, pain, ST, cough, swelling or wheezing.     Wt Readings from Last 3 Encounters:  07/19/22 199 lb (90.3 kg)  07/12/22 198 lb (89.8 kg)  07/04/22 198 lb 2 oz (89.9 kg)   BP Readings from Last 3 Encounters:  07/19/22 122/70  07/12/22 132/78  07/04/22 120/68   Immunization History  Administered Date(s) Administered   Fluad Quad(high Dose 65+) 05/07/2019   Influenza Split 05/04/2011, 04/28/2012   Influenza, High Dose Seasonal PF 03/28/2017, 05/08/2018   Influenza,inj,Quad PF,6+ Mos 05/18/2013, 04/09/2014, 04/15/2015, 04/26/2016   Influenza-Unspecified 04/04/2015   Pneumococcal Conjugate-13 10/08/2014   Pneumococcal Polysaccharide-23 05/04/2011   Tdap 04/28/2012, 12/07/2020  There are no preventive care reminders to display for this patient.    Past Medical History:  Diagnosis Date   Cerebrovascular disease 02/05/2019    Concussion '06, '11   Diabetes mellitus    diet management   Family history of colon cancer 02/28/2016   Fibromyalgia 10/08/2013   Hyperlipidemia    diet managed   Varicella    Past Surgical History:  Procedure Laterality Date   APotter  laparotomy    reports that she has never smoked. She has never used smokeless tobacco. She reports that she does not drink alcohol and does not use drugs. family history includes COPD in her mother; Cancer (age of onset: 813 in her mother; Diabetes in her maternal grandfather and mother; Heart disease in her father and mother; Rheumatic fever in her father. Allergies  Allergen Reactions   Codeine Other (See Comments)   Methylprednisolone Itching   Current Outpatient Medications on File Prior to Visit  Medication Sig Dispense Refill   albuterol (VENTOLIN HFA) 108 (90 Base) MCG/ACT inhaler Inhale 2 puffs into the lungs every 6 (six) hours as needed for wheezing or shortness of breath. 8 g 0   celecoxib (CELEBREX) 200 MG capsule Take 1 capsule (200 mg total) by mouth 2 (two) times daily as needed. 180 capsule 1   Cholecalciferol (CVS D3) 50 MCG (2000 UT) CAPS Take by mouth.      Cyanocobalamin (CVS VITAMIN B-12 PO) Take 2,500 tablets by  mouth every other day.     EQ ALLERGY RELIEF, CETIRIZINE, 10 MG tablet Take 1 tablet by mouth once daily 90 tablet 0   fluocinonide cream (LIDEX) 0.03 % Apply 1 application  topically 2 (two) times daily.     HYDROcodone bit-homatropine (HYCODAN) 5-1.5 MG/5ML syrup Take 5 mLs by mouth every 8 (eight) hours as needed for cough. 240 mL 0   HYDROcodone-acetaminophen (NORCO/VICODIN) 5-325 MG tablet Take 1 tablet by mouth every 6 (six) hours as needed. 30 tablet 0   levofloxacin (LEVAQUIN) 500 MG tablet Take 1 tablet (500 mg total) by mouth daily. 10 tablet 0   lidocaine (LIDODERM) 5 % Place 1 patch onto the skin daily. Remove & Discard patch within 12 hours or as  directed by MD 30 patch 0   Multiple Vitamins-Minerals (MULTIVITAMIN WITH MINERALS) tablet Take 1 tablet by mouth daily.     Multiple Vitamins-Minerals (PRESERVISION AREDS 2 PO) Take by mouth.     pseudoephedrine (SUDAFED) 120 MG 12 hr tablet Take 1 tablet (120 mg total) by mouth 2 (two) times daily as needed for congestion. 60 tablet 0   rosuvastatin (CRESTOR) 40 MG tablet Take 1 tablet by mouth once daily 90 tablet 0   triamcinolone (NASACORT) 55 MCG/ACT AERO nasal inhaler Place 2 sprays into the nose daily. 1 each 2   No current facility-administered medications on file prior to visit.        ROS:  All others reviewed and negative.  Objective        PE:  BP 122/70 (BP Location: Right Arm, Patient Position: Sitting, Cuff Size: Large)   Pulse 70   Temp 98.1 F (36.7 C) (Oral)   Ht '5\' 2"'$  (1.575 m)   Wt 199 lb (90.3 kg)   SpO2 96%   BMI 36.40 kg/m                 Constitutional: Pt appears in NAD               HENT: Head: NCAT.                Right Ear: External ear normal.                 Left Ear: External ear normal. Bilat tm's with mild erythema.  Max sinus areas non tender.  Pharynx with mild erythema, no exudate               Eyes: . Pupils are equal, round, and reactive to light. Conjunctivae and EOM are normal               Nose: without d/c or deformity               Neck: Neck supple. Gross normal ROM, thryoid with firm nodule right side / isthmus without other mass or LA note               Cardiovascular: Normal rate and regular rhythm.                 Pulmonary/Chest: Effort normal and breath sounds without rales or wheezing.                Abd:  Soft, NT, ND, + BS, no organomegaly; right palm with 3rd finger tendon mid palmar firm lesion, no contrature               Neurological: Pt is alert. At baseline orientation, motor grossly intact  Skin: Skin is warm. No rashes, no other new lesions, LE edema - trace pedal bialteral               Psychiatric: Pt  behavior is normal without agitation   Micro: none  Cardiac tracings I have personally interpreted today:  none  Pertinent Radiological findings (summarize): none   Lab Results  Component Value Date   WBC 5.9 07/04/2022   HGB 13.1 07/04/2022   HCT 39.6 07/04/2022   PLT 289.0 07/04/2022   GLUCOSE 113 (H) 07/04/2022   CHOL 137 07/04/2022   TRIG 136.0 07/04/2022   HDL 49.60 07/04/2022   LDLDIRECT 80.0 07/17/2021   LDLCALC 60 07/04/2022   ALT 20 07/04/2022   AST 19 07/04/2022   NA 141 07/04/2022   K 4.1 07/04/2022   CL 103 07/04/2022   CREATININE 0.79 07/04/2022   BUN 16 07/04/2022   CO2 29 07/04/2022   TSH 2.22 07/04/2022   INR 0.92 05/24/2011   HGBA1C 6.7 (H) 07/04/2022   MICROALBUR 2.2 (H) 07/04/2022   Assessment/Plan:  Debra Atkinson is a 77 y.o. White or Caucasian [1] female with  has a past medical history of Cerebrovascular disease (02/05/2019), Concussion ('06, '11), Diabetes mellitus, Family history of colon cancer (02/28/2016), Fibromyalgia (10/08/2013), Hyperlipidemia, and Varicella.  Encounter for well adult exam with abnormal findings Age and sex appropriate education and counseling updated with regular exercise and diet Referrals for preventative services - none needed Immunizations addressed - for shingrix at pharmac, declines flu shot Smoking counseling  - none needed Evidence for depression or other mood disorder - chronic anxiety stable Most recent labs reviewed. I have personally reviewed and have noted: 1) the patient's medical and social history 2) The patient's current medications and supplements 3) The patient's height, weight, and BMI have been recorded in the chart   Allergic rhinitis Mild to mod seasonal worsening, for add nasacort asd,,  to f/u any worsening symptoms or concerns   Hyperlipidemia Lab Results  Component Value Date   LDLCALC 60 07/04/2022   Stable, pt to continue current statin crestor 40 mg qd   Pre-diabetes Lab Results   Component Value Date   HGBA1C 6.7 (H) 07/04/2022   Uncontrolled mild recent wrosening likely related to several prednisone taper rounds, pt to continue current diet, wt control, declines OHA for now   Thyroid nodule ? Enlarging, for thyroid u/s  Vitamin D deficiency Last vitamin D Lab Results  Component Value Date   VD25OH 49.01 07/04/2022   Stable, cont oral replacement   Tendonitis Right mid palmar, mild, no contracture, declines hand surgury referral for now Followup: No follow-ups on file.  Cathlean Cower, MD 07/21/2022 4:04 PM [facility]

## 2022-07-21 ENCOUNTER — Encounter: Payer: Self-pay | Admitting: Internal Medicine

## 2022-07-21 DIAGNOSIS — M779 Enthesopathy, unspecified: Secondary | ICD-10-CM | POA: Insufficient documentation

## 2022-07-21 NOTE — Assessment & Plan Note (Signed)
Last vitamin D Lab Results  Component Value Date   VD25OH 49.01 07/04/2022   Stable, cont oral replacement

## 2022-07-21 NOTE — Assessment & Plan Note (Signed)
Right mid palmar, mild, no contracture, declines hand surgury referral for now

## 2022-07-21 NOTE — Assessment & Plan Note (Signed)
Lab Results  Component Value Date   LDLCALC 60 07/04/2022   Stable, pt to continue current statin crestor 40 mg qd

## 2022-07-21 NOTE — Assessment & Plan Note (Signed)
Age and sex appropriate education and counseling updated with regular exercise and diet Referrals for preventative services - none needed Immunizations addressed - for shingrix at pharmac, declines flu shot Smoking counseling  - none needed Evidence for depression or other mood disorder - chronic anxiety stable Most recent labs reviewed. I have personally reviewed and have noted: 1) the patient's medical and social history 2) The patient's current medications and supplements 3) The patient's height, weight, and BMI have been recorded in the chart

## 2022-07-21 NOTE — Assessment & Plan Note (Signed)
Mild to mod seasonal worsening, for add nasacort asd,,  to f/u any worsening symptoms or concerns

## 2022-07-21 NOTE — Assessment & Plan Note (Signed)
?  Enlarging, for thyroid u/s

## 2022-07-21 NOTE — Assessment & Plan Note (Signed)
Lab Results  Component Value Date   HGBA1C 6.7 (H) 07/04/2022   Uncontrolled mild recent wrosening likely related to several prednisone taper rounds, pt to continue current diet, wt control, declines OHA for now

## 2022-07-24 ENCOUNTER — Ambulatory Visit
Admission: RE | Admit: 2022-07-24 | Discharge: 2022-07-24 | Disposition: A | Discharge status: Home / Self Care | Payer: Medicare HMO | Source: Ambulatory Visit | Attending: Internal Medicine | Admitting: Internal Medicine

## 2022-07-24 DIAGNOSIS — E041 Nontoxic single thyroid nodule: Secondary | ICD-10-CM | POA: Diagnosis not present

## 2022-08-01 NOTE — Progress Notes (Unsigned)
Benito Mccreedy D.[facility] [place] Phone: [phone]   Assessment and Plan:     There are no diagnoses linked to this encounter.  ***   Pertinent previous records reviewed include ***   Follow Up: ***     Subjective:   I, Debra Atkinson, am serving as a Education administrator for Doctor Glennon Mac   Chief Complaint: right leg and foot pain    HPI:    07/12/2022 Patient is 77 year old female complaining of right leg and foot pain . Patient states that she was on an antibiotic and two days after she started she started getting pain in her arch and behind her right knee, been going for a few week ,thinks her celebrex is making it feel better since its for inflammation , she does state that she is getting better each day      . female here with c/o above, with 1 wk onset unusual pain and tender soreness to the right posteromedial knee but no other knee swelling, pain ro calf pain or swelling.  Worse to stand and walk, better to sit. Now mild to mod, limps somewhat to walk, which has been more lately due to wrosening right foot plantar pain, worse in the AM for the last few wks.  No worsening lower back pain,     She has a knot on her right hand   08/02/2022 Patient states    Relevant Historical Information: Recent quinolone use,  Additional pertinent review of systems negative.   Current Outpatient Medications:    albuterol (VENTOLIN HFA) 108 (90 Base) MCG/ACT inhaler, Inhale 2 puffs into the lungs every 6 (six) hours as needed for wheezing or shortness of breath., Disp: 8 g, Rfl: 0   celecoxib (CELEBREX) 200 MG capsule, Take 1 capsule (200 mg total) by mouth 2 (two) times daily as needed., Disp: 180 capsule, Rfl: 1   Cholecalciferol (CVS D3) 19 MCG (2000 UT) CAPS, Take by mouth. , Disp: , Rfl:    Cyanocobalamin (CVS VITAMIN B-12 PO), Take 2,500 tablets by mouth every other day., Disp: , Rfl:    EQ ALLERGY RELIEF,  CETIRIZINE, 10 MG tablet, Take 1 tablet by mouth once daily, Disp: 90 tablet, Rfl: 0   fluocinonide cream (LIDEX) 5.27 %, Apply 1 application  topically 2 (two) times daily., Disp: , Rfl:    HYDROcodone bit-homatropine (HYCODAN) 5-1.5 MG/5ML syrup, Take 5 mLs by mouth every 8 (eight) hours as needed for cough., Disp: 240 mL, Rfl: 0   HYDROcodone-acetaminophen (NORCO/VICODIN) 5-325 MG tablet, Take 1 tablet by mouth every 6 (six) hours as needed., Disp: 30 tablet, Rfl: 0   levofloxacin (LEVAQUIN) 500 MG tablet, Take 1 tablet (500 mg total) by mouth daily., Disp: 10 tablet, Rfl: 0   lidocaine (LIDODERM) 5 %, Place 1 patch onto the skin daily. Remove & Discard patch within 12 hours or as directed by MD, Disp: 30 patch, Rfl: 0   Multiple Vitamins-Minerals (MULTIVITAMIN WITH MINERALS) tablet, Take 1 tablet by mouth daily., Disp: , Rfl:    Multiple Vitamins-Minerals (PRESERVISION AREDS 2 PO), Take by mouth., Disp: , Rfl:    pseudoephedrine (SUDAFED) 120 MG 12 hr tablet, Take 1 tablet (120 mg total) by mouth 2 (two) times daily as needed for congestion., Disp: 60 tablet, Rfl: 0   rosuvastatin (CRESTOR) 40 MG tablet, Take 1 tablet by mouth once daily, Disp: 90 tablet, Rfl: 0   triamcinolone (NASACORT) 55 MCG/ACT  AERO nasal inhaler, Place 2 sprays into the nose daily., Disp: 1 each, Rfl: 2   Objective:     There were no vitals filed for this visit.    There is no height or weight on file to calculate BMI.    Physical Exam:    ***   Electronically signed by:  Benito Mccreedy D.[facility] 4:00 PM 08/01/22

## 2022-08-02 ENCOUNTER — Ambulatory Visit: Payer: Medicare HMO | Admitting: Sports Medicine

## 2022-08-02 ENCOUNTER — Ambulatory Visit (INDEPENDENT_AMBULATORY_CARE_PROVIDER_SITE_OTHER): Payer: Medicare HMO

## 2022-08-02 VITALS — BP 136/82 | HR 94 | Ht 62.0 in | Wt 199.0 lb

## 2022-08-02 DIAGNOSIS — M25571 Pain in right ankle and joints of right foot: Secondary | ICD-10-CM | POA: Diagnosis not present

## 2022-08-02 DIAGNOSIS — M79604 Pain in right leg: Secondary | ICD-10-CM

## 2022-08-02 DIAGNOSIS — M1711 Unilateral primary osteoarthritis, right knee: Secondary | ICD-10-CM | POA: Diagnosis not present

## 2022-08-02 DIAGNOSIS — G8929 Other chronic pain: Secondary | ICD-10-CM | POA: Diagnosis not present

## 2022-08-02 DIAGNOSIS — R102 Pelvic and perineal pain: Secondary | ICD-10-CM | POA: Diagnosis not present

## 2022-08-02 DIAGNOSIS — M25561 Pain in right knee: Secondary | ICD-10-CM

## 2022-08-02 MED ORDER — MELOXICAM 15 MG PO TABS: 15.0000 mg | ORAL_TABLET | Freq: Every day | ORAL | 0 refills | Status: DC

## 2022-08-02 NOTE — Patient Instructions (Addendum)
Good to see you  - Start meloxicam 15 mg daily x3 weeks.  May use Tylenol [phone] mg 2 to 3 times a day for breakthrough pain. Pt referral  Knee and ankle HEP  4 week follow up

## 2022-08-06 DIAGNOSIS — E119 Type 2 diabetes mellitus without complications: Secondary | ICD-10-CM | POA: Diagnosis not present

## 2022-08-06 DIAGNOSIS — H353122 Nonexudative age-related macular degeneration, left eye, intermediate dry stage: Secondary | ICD-10-CM | POA: Diagnosis not present

## 2022-08-06 DIAGNOSIS — H353111 Nonexudative age-related macular degeneration, right eye, early dry stage: Secondary | ICD-10-CM | POA: Diagnosis not present

## 2022-08-06 DIAGNOSIS — I10 Essential (primary) hypertension: Secondary | ICD-10-CM | POA: Diagnosis not present

## 2022-08-06 DIAGNOSIS — H2513 Age-related nuclear cataract, bilateral: Secondary | ICD-10-CM | POA: Diagnosis not present

## 2022-08-15 DIAGNOSIS — M25561 Pain in right knee: Secondary | ICD-10-CM | POA: Diagnosis not present

## 2022-08-15 DIAGNOSIS — R262 Difficulty in walking, not elsewhere classified: Secondary | ICD-10-CM | POA: Diagnosis not present

## 2022-08-15 DIAGNOSIS — M799 Soft tissue disorder, unspecified: Secondary | ICD-10-CM | POA: Diagnosis not present

## 2022-08-15 DIAGNOSIS — M79604 Pain in right leg: Secondary | ICD-10-CM | POA: Diagnosis not present

## 2022-08-20 DIAGNOSIS — H2513 Age-related nuclear cataract, bilateral: Secondary | ICD-10-CM | POA: Diagnosis not present

## 2022-08-20 DIAGNOSIS — E119 Type 2 diabetes mellitus without complications: Secondary | ICD-10-CM | POA: Diagnosis not present

## 2022-08-20 DIAGNOSIS — H2512 Age-related nuclear cataract, left eye: Secondary | ICD-10-CM | POA: Diagnosis not present

## 2022-08-20 DIAGNOSIS — H40013 Open angle with borderline findings, low risk, bilateral: Secondary | ICD-10-CM | POA: Diagnosis not present

## 2022-08-20 DIAGNOSIS — H353131 Nonexudative age-related macular degeneration, bilateral, early dry stage: Secondary | ICD-10-CM | POA: Diagnosis not present

## 2022-08-23 DIAGNOSIS — M62561 Muscle wasting and atrophy, not elsewhere classified, right lower leg: Secondary | ICD-10-CM | POA: Diagnosis not present

## 2022-08-23 DIAGNOSIS — M799 Soft tissue disorder, unspecified: Secondary | ICD-10-CM | POA: Diagnosis not present

## 2022-08-23 DIAGNOSIS — M62551 Muscle wasting and atrophy, not elsewhere classified, right thigh: Secondary | ICD-10-CM | POA: Diagnosis not present

## 2022-08-23 DIAGNOSIS — R262 Difficulty in walking, not elsewhere classified: Secondary | ICD-10-CM | POA: Diagnosis not present

## 2022-08-23 DIAGNOSIS — M79604 Pain in right leg: Secondary | ICD-10-CM | POA: Diagnosis not present

## 2022-08-23 DIAGNOSIS — M25561 Pain in right knee: Secondary | ICD-10-CM | POA: Diagnosis not present

## 2022-08-28 DIAGNOSIS — M62551 Muscle wasting and atrophy, not elsewhere classified, right thigh: Secondary | ICD-10-CM | POA: Diagnosis not present

## 2022-08-28 DIAGNOSIS — M62561 Muscle wasting and atrophy, not elsewhere classified, right lower leg: Secondary | ICD-10-CM | POA: Diagnosis not present

## 2022-08-28 DIAGNOSIS — M25561 Pain in right knee: Secondary | ICD-10-CM | POA: Diagnosis not present

## 2022-08-28 DIAGNOSIS — M799 Soft tissue disorder, unspecified: Secondary | ICD-10-CM | POA: Diagnosis not present

## 2022-08-28 DIAGNOSIS — M79604 Pain in right leg: Secondary | ICD-10-CM | POA: Diagnosis not present

## 2022-08-28 DIAGNOSIS — R262 Difficulty in walking, not elsewhere classified: Secondary | ICD-10-CM | POA: Diagnosis not present

## 2022-08-29 NOTE — Progress Notes (Unsigned)
Debra Atkinson D.[facility] Phone: [phone]   Assessment and Plan:     There are no diagnoses linked to this encounter.  ***   Pertinent previous records reviewed include ***   Follow Up: ***     Subjective:   I, Debra Atkinson, am serving as a Education administrator for Debra Atkinson   Chief Complaint: right leg and foot pain    HPI:    07/12/2022 Patient is 77 year old female complaining of right leg and foot pain . Patient states that she was on an antibiotic and two days after she started she started getting pain in her arch and behind her right knee, been going for a few week ,thinks her celebrex is making it feel better since its for inflammation , she does state that she is getting better each day      . female here with c/o above, with 1 wk onset unusual pain and tender soreness to the right posteromedial knee but no other knee swelling, pain ro calf pain or swelling.  Worse to stand and walk, better to sit. Now mild to mod, limps somewhat to walk, which has been more lately due to wrosening right foot plantar pain, worse in the AM for the last few wks.  No worsening lower back pain,     She has a knot on her right hand    08/02/2022 Patient states she has intermittent pain , arch pain and soreness on her leg today neck pain today and last night    08/30/2022 Patient states    Relevant Historical Information: Recent quinolone use,  Additional pertinent review of systems negative.   Current Outpatient Medications:    albuterol (VENTOLIN HFA) 108 (90 Base) MCG/ACT inhaler, Inhale 2 puffs into the lungs every 6 (six) hours as needed for wheezing or shortness of breath., Disp: 8 g, Rfl: 0   celecoxib (CELEBREX) 200 MG capsule, Take 1 capsule (200 mg total) by mouth 2 (two) times daily as needed., Disp: 180 capsule, Rfl: 1   Cholecalciferol (CVS D3) 44 MCG (2000 UT) CAPS, Take by mouth. , Disp: ,  Rfl:    Cyanocobalamin (CVS VITAMIN B-12 PO), Take 2,500 tablets by mouth every other day., Disp: , Rfl:    EQ ALLERGY RELIEF, CETIRIZINE, 10 MG tablet, Take 1 tablet by mouth once daily, Disp: 90 tablet, Rfl: 0   fluocinonide cream (LIDEX) [id number] %, Apply 1 application  topically 2 (two) times daily., Disp: , Rfl:    HYDROcodone bit-homatropine (HYCODAN) 5-1.5 MG/5ML syrup, Take 5 mLs by mouth every 8 (eight) hours as needed for cough., Disp: 240 mL, Rfl: 0   HYDROcodone-acetaminophen (NORCO/VICODIN) 5-325 MG tablet, Take 1 tablet by mouth every 6 (six) hours as needed., Disp: 30 tablet, Rfl: 0   levofloxacin (LEVAQUIN) 500 MG tablet, Take 1 tablet (500 mg total) by mouth daily., Disp: 10 tablet, Rfl: 0   lidocaine (LIDODERM) 5 %, Place 1 patch onto the skin daily. Remove & Discard patch within 12 hours or as directed by MD, Disp: 30 patch, Rfl: 0   meloxicam (MOBIC) 15 MG tablet, Take 1 tablet (15 mg total) by mouth daily., Disp: 21 tablet, Rfl: 0   Multiple Vitamins-Minerals (MULTIVITAMIN WITH MINERALS) tablet, Take 1 tablet by mouth daily., Disp: , Rfl:    Multiple Vitamins-Minerals (PRESERVISION AREDS 2 PO), Take by mouth., Disp: , Rfl:    pseudoephedrine (SUDAFED) 120 MG 12 hr  tablet, Take 1 tablet (120 mg total) by mouth 2 (two) times daily as needed for congestion., Disp: 60 tablet, Rfl: 0   rosuvastatin (CRESTOR) 40 MG tablet, Take 1 tablet by mouth once daily, Disp: 90 tablet, Rfl: 0   triamcinolone (NASACORT) 55 MCG/ACT AERO nasal inhaler, Place 2 sprays into the nose daily., Disp: 1 each, Rfl: 2   Objective:     There were no vitals filed for this visit.    There is no height or weight on file to calculate BMI.    Physical Exam:    ***   Electronically signed by:  Debra Atkinson D.[facility] 12:04 PM 08/29/22

## 2022-08-30 ENCOUNTER — Ambulatory Visit: Payer: Medicare HMO | Admitting: Sports Medicine

## 2022-08-30 VITALS — BP 132/80 | HR 88 | Ht 62.0 in | Wt 201.0 lb

## 2022-08-30 DIAGNOSIS — M79604 Pain in right leg: Secondary | ICD-10-CM | POA: Diagnosis not present

## 2022-08-30 DIAGNOSIS — M25561 Pain in right knee: Secondary | ICD-10-CM

## 2022-08-30 DIAGNOSIS — M79671 Pain in right foot: Secondary | ICD-10-CM

## 2022-08-30 DIAGNOSIS — G8929 Other chronic pain: Secondary | ICD-10-CM

## 2022-08-30 NOTE — Patient Instructions (Addendum)
Good to see you  Tylenol [phone] mg 2-3 times a day for pain relief  Discontinue meloxicam  Continue PT  4 week follow up

## 2022-08-31 DIAGNOSIS — M799 Soft tissue disorder, unspecified: Secondary | ICD-10-CM | POA: Diagnosis not present

## 2022-08-31 DIAGNOSIS — M62551 Muscle wasting and atrophy, not elsewhere classified, right thigh: Secondary | ICD-10-CM | POA: Diagnosis not present

## 2022-08-31 DIAGNOSIS — M62561 Muscle wasting and atrophy, not elsewhere classified, right lower leg: Secondary | ICD-10-CM | POA: Diagnosis not present

## 2022-08-31 DIAGNOSIS — M25561 Pain in right knee: Secondary | ICD-10-CM | POA: Diagnosis not present

## 2022-08-31 DIAGNOSIS — R262 Difficulty in walking, not elsewhere classified: Secondary | ICD-10-CM | POA: Diagnosis not present

## 2022-08-31 DIAGNOSIS — M79604 Pain in right leg: Secondary | ICD-10-CM | POA: Diagnosis not present

## 2022-09-01 ENCOUNTER — Other Ambulatory Visit: Payer: Self-pay | Admitting: Internal Medicine

## 2022-09-01 NOTE — Telephone Encounter (Signed)
Please refill as per office routine med refill policy (all routine meds to be refilled for 3 mo or monthly (per pt preference) up to one year from last visit, then month to month grace period for 3 mo, then further med refills will have to be denied) ? ?

## 2022-09-04 DIAGNOSIS — R262 Difficulty in walking, not elsewhere classified: Secondary | ICD-10-CM | POA: Diagnosis not present

## 2022-09-04 DIAGNOSIS — M799 Soft tissue disorder, unspecified: Secondary | ICD-10-CM | POA: Diagnosis not present

## 2022-09-04 DIAGNOSIS — M25561 Pain in right knee: Secondary | ICD-10-CM | POA: Diagnosis not present

## 2022-09-04 DIAGNOSIS — M62551 Muscle wasting and atrophy, not elsewhere classified, right thigh: Secondary | ICD-10-CM | POA: Diagnosis not present

## 2022-09-04 DIAGNOSIS — M62561 Muscle wasting and atrophy, not elsewhere classified, right lower leg: Secondary | ICD-10-CM | POA: Diagnosis not present

## 2022-09-04 DIAGNOSIS — M79604 Pain in right leg: Secondary | ICD-10-CM | POA: Diagnosis not present

## 2022-09-06 DIAGNOSIS — R262 Difficulty in walking, not elsewhere classified: Secondary | ICD-10-CM | POA: Diagnosis not present

## 2022-09-06 DIAGNOSIS — M79604 Pain in right leg: Secondary | ICD-10-CM | POA: Diagnosis not present

## 2022-09-06 DIAGNOSIS — M25561 Pain in right knee: Secondary | ICD-10-CM | POA: Diagnosis not present

## 2022-09-06 DIAGNOSIS — M62551 Muscle wasting and atrophy, not elsewhere classified, right thigh: Secondary | ICD-10-CM | POA: Diagnosis not present

## 2022-09-06 DIAGNOSIS — M62561 Muscle wasting and atrophy, not elsewhere classified, right lower leg: Secondary | ICD-10-CM | POA: Diagnosis not present

## 2022-09-06 DIAGNOSIS — M799 Soft tissue disorder, unspecified: Secondary | ICD-10-CM | POA: Diagnosis not present

## 2022-09-12 DIAGNOSIS — M25561 Pain in right knee: Secondary | ICD-10-CM | POA: Diagnosis not present

## 2022-09-12 DIAGNOSIS — R262 Difficulty in walking, not elsewhere classified: Secondary | ICD-10-CM | POA: Diagnosis not present

## 2022-09-12 DIAGNOSIS — M62561 Muscle wasting and atrophy, not elsewhere classified, right lower leg: Secondary | ICD-10-CM | POA: Diagnosis not present

## 2022-09-12 DIAGNOSIS — M79604 Pain in right leg: Secondary | ICD-10-CM | POA: Diagnosis not present

## 2022-09-12 DIAGNOSIS — M62551 Muscle wasting and atrophy, not elsewhere classified, right thigh: Secondary | ICD-10-CM | POA: Diagnosis not present

## 2022-09-12 DIAGNOSIS — M799 Soft tissue disorder, unspecified: Secondary | ICD-10-CM | POA: Diagnosis not present

## 2022-09-18 DIAGNOSIS — M25561 Pain in right knee: Secondary | ICD-10-CM | POA: Diagnosis not present

## 2022-09-18 DIAGNOSIS — M62561 Muscle wasting and atrophy, not elsewhere classified, right lower leg: Secondary | ICD-10-CM | POA: Diagnosis not present

## 2022-09-18 DIAGNOSIS — M62551 Muscle wasting and atrophy, not elsewhere classified, right thigh: Secondary | ICD-10-CM | POA: Diagnosis not present

## 2022-09-18 DIAGNOSIS — R262 Difficulty in walking, not elsewhere classified: Secondary | ICD-10-CM | POA: Diagnosis not present

## 2022-09-18 DIAGNOSIS — M799 Soft tissue disorder, unspecified: Secondary | ICD-10-CM | POA: Diagnosis not present

## 2022-09-18 DIAGNOSIS — M79604 Pain in right leg: Secondary | ICD-10-CM | POA: Diagnosis not present

## 2022-09-20 DIAGNOSIS — M62561 Muscle wasting and atrophy, not elsewhere classified, right lower leg: Secondary | ICD-10-CM | POA: Diagnosis not present

## 2022-09-20 DIAGNOSIS — M62551 Muscle wasting and atrophy, not elsewhere classified, right thigh: Secondary | ICD-10-CM | POA: Diagnosis not present

## 2022-09-20 DIAGNOSIS — M799 Soft tissue disorder, unspecified: Secondary | ICD-10-CM | POA: Diagnosis not present

## 2022-09-20 DIAGNOSIS — M79604 Pain in right leg: Secondary | ICD-10-CM | POA: Diagnosis not present

## 2022-09-20 DIAGNOSIS — R262 Difficulty in walking, not elsewhere classified: Secondary | ICD-10-CM | POA: Diagnosis not present

## 2022-09-20 DIAGNOSIS — M25561 Pain in right knee: Secondary | ICD-10-CM | POA: Diagnosis not present

## 2022-09-26 DIAGNOSIS — M62561 Muscle wasting and atrophy, not elsewhere classified, right lower leg: Secondary | ICD-10-CM | POA: Diagnosis not present

## 2022-09-26 DIAGNOSIS — R262 Difficulty in walking, not elsewhere classified: Secondary | ICD-10-CM | POA: Diagnosis not present

## 2022-09-26 DIAGNOSIS — M799 Soft tissue disorder, unspecified: Secondary | ICD-10-CM | POA: Diagnosis not present

## 2022-09-26 DIAGNOSIS — M62551 Muscle wasting and atrophy, not elsewhere classified, right thigh: Secondary | ICD-10-CM | POA: Diagnosis not present

## 2022-09-26 DIAGNOSIS — M79604 Pain in right leg: Secondary | ICD-10-CM | POA: Diagnosis not present

## 2022-09-26 DIAGNOSIS — M25561 Pain in right knee: Secondary | ICD-10-CM | POA: Diagnosis not present

## 2022-09-26 NOTE — Progress Notes (Unsigned)
Debra Atkinson D.[facility] [place] Phone: [phone]   Assessment and Plan:     There are no diagnoses linked to this encounter.  ***   Pertinent previous records reviewed include ***   Follow Up: ***     Subjective:   I, Debra Atkinson, am serving as a Education administrator for Doctor Glennon Mac   Chief Complaint: right leg and foot pain    HPI:    07/12/2022 Patient is 77 year old female complaining of right leg and foot pain . Patient states that she was on an antibiotic and two days after she started she started getting pain in her arch and behind her right knee, been going for a few week ,thinks her celebrex is making it feel better since its for inflammation , she does state that she is getting better each day      . female here with c/o above, with 1 wk onset unusual pain and tender soreness to the right posteromedial knee but no other knee swelling, pain ro calf pain or swelling.  Worse to stand and walk, better to sit. Now mild to mod, limps somewhat to walk, which has been more lately due to wrosening right foot plantar pain, worse in the AM for the last few wks.  No worsening lower back pain,     She has a knot on her right hand    08/02/2022 Patient states she has intermittent pain , arch pain and soreness on her leg today neck pain today and last night    08/30/2022 Patient states that she is about the same    09/27/2022 Patient states    Relevant Historical Information: Recent quinolone use, Additional pertinent review of systems negative.   Current Outpatient Medications:    albuterol (VENTOLIN HFA) 108 (90 Base) MCG/ACT inhaler, Inhale 2 puffs into the lungs every 6 (six) hours as needed for wheezing or shortness of breath., Disp: 8 g, Rfl: 0   celecoxib (CELEBREX) 200 MG capsule, Take 1 capsule (200 mg total) by mouth 2 (two) times daily as needed., Disp: 180 capsule, Rfl: 1   Cholecalciferol (CVS  D3) 98 MCG (2000 UT) CAPS, Take by mouth. , Disp: , Rfl:    Cyanocobalamin (CVS VITAMIN B-12 PO), Take 2,500 tablets by mouth every other day., Disp: , Rfl:    EQ ALLERGY RELIEF, CETIRIZINE, 10 MG tablet, Take 1 tablet by mouth once daily, Disp: 90 tablet, Rfl: 0   fluocinonide cream (LIDEX) [id number] %, Apply 1 application  topically 2 (two) times daily., Disp: , Rfl:    HYDROcodone bit-homatropine (HYCODAN) 5-1.5 MG/5ML syrup, Take 5 mLs by mouth every 8 (eight) hours as needed for cough., Disp: 240 mL, Rfl: 0   HYDROcodone-acetaminophen (NORCO/VICODIN) 5-325 MG tablet, Take 1 tablet by mouth every 6 (six) hours as needed., Disp: 30 tablet, Rfl: 0   levofloxacin (LEVAQUIN) 500 MG tablet, Take 1 tablet (500 mg total) by mouth daily., Disp: 10 tablet, Rfl: 0   lidocaine (LIDODERM) 5 %, Place 1 patch onto the skin daily. Remove & Discard patch within 12 hours or as directed by MD, Disp: 30 patch, Rfl: 0   meloxicam (MOBIC) 15 MG tablet, Take 1 tablet (15 mg total) by mouth daily., Disp: 21 tablet, Rfl: 0   Multiple Vitamins-Minerals (MULTIVITAMIN WITH MINERALS) tablet, Take 1 tablet by mouth daily., Disp: , Rfl:    Multiple Vitamins-Minerals (PRESERVISION AREDS 2 PO), Take by mouth., Disp: ,  Rfl:    pseudoephedrine (SUDAFED) 120 MG 12 hr tablet, Take 1 tablet (120 mg total) by mouth 2 (two) times daily as needed for congestion., Disp: 60 tablet, Rfl: 0   rosuvastatin (CRESTOR) 40 MG tablet, Take 1 tablet by mouth once daily, Disp: 90 tablet, Rfl: 0   triamcinolone (NASACORT) 55 MCG/ACT AERO nasal inhaler, Place 2 sprays into the nose daily., Disp: 1 each, Rfl: 2   Objective:     There were no vitals filed for this visit.    There is no height or weight on file to calculate BMI.    Physical Exam:    ***   Electronically signed by:  Debra Atkinson D.[facility] 7:43 AM 09/26/22

## 2022-09-27 ENCOUNTER — Ambulatory Visit: Payer: Medicare HMO | Admitting: Sports Medicine

## 2022-09-27 ENCOUNTER — Ambulatory Visit (INDEPENDENT_AMBULATORY_CARE_PROVIDER_SITE_OTHER): Payer: Medicare HMO

## 2022-09-27 VITALS — HR 85 | Ht 62.0 in | Wt 202.0 lb

## 2022-09-27 DIAGNOSIS — M79604 Pain in right leg: Secondary | ICD-10-CM

## 2022-09-27 DIAGNOSIS — M25561 Pain in right knee: Secondary | ICD-10-CM

## 2022-09-27 DIAGNOSIS — M545 Low back pain, unspecified: Secondary | ICD-10-CM | POA: Diagnosis not present

## 2022-09-27 DIAGNOSIS — G8929 Other chronic pain: Secondary | ICD-10-CM

## 2022-09-27 NOTE — Patient Instructions (Addendum)
Good to see you Tylenol [phone] mg 2-3 times a day for pain relief  Continue HEP Xrays on the way out 4 week follow up

## 2022-09-28 DIAGNOSIS — M25561 Pain in right knee: Secondary | ICD-10-CM | POA: Diagnosis not present

## 2022-09-28 DIAGNOSIS — M79604 Pain in right leg: Secondary | ICD-10-CM | POA: Diagnosis not present

## 2022-09-28 DIAGNOSIS — M62551 Muscle wasting and atrophy, not elsewhere classified, right thigh: Secondary | ICD-10-CM | POA: Diagnosis not present

## 2022-09-28 DIAGNOSIS — M62561 Muscle wasting and atrophy, not elsewhere classified, right lower leg: Secondary | ICD-10-CM | POA: Diagnosis not present

## 2022-09-28 DIAGNOSIS — M799 Soft tissue disorder, unspecified: Secondary | ICD-10-CM | POA: Diagnosis not present

## 2022-09-28 DIAGNOSIS — R262 Difficulty in walking, not elsewhere classified: Secondary | ICD-10-CM | POA: Diagnosis not present

## 2022-10-02 DIAGNOSIS — M79604 Pain in right leg: Secondary | ICD-10-CM | POA: Diagnosis not present

## 2022-10-02 DIAGNOSIS — R262 Difficulty in walking, not elsewhere classified: Secondary | ICD-10-CM | POA: Diagnosis not present

## 2022-10-02 DIAGNOSIS — M799 Soft tissue disorder, unspecified: Secondary | ICD-10-CM | POA: Diagnosis not present

## 2022-10-02 DIAGNOSIS — M62561 Muscle wasting and atrophy, not elsewhere classified, right lower leg: Secondary | ICD-10-CM | POA: Diagnosis not present

## 2022-10-02 DIAGNOSIS — M25561 Pain in right knee: Secondary | ICD-10-CM | POA: Diagnosis not present

## 2022-10-02 DIAGNOSIS — M62551 Muscle wasting and atrophy, not elsewhere classified, right thigh: Secondary | ICD-10-CM | POA: Diagnosis not present

## 2022-10-04 DIAGNOSIS — M799 Soft tissue disorder, unspecified: Secondary | ICD-10-CM | POA: Diagnosis not present

## 2022-10-04 DIAGNOSIS — M62551 Muscle wasting and atrophy, not elsewhere classified, right thigh: Secondary | ICD-10-CM | POA: Diagnosis not present

## 2022-10-04 DIAGNOSIS — R262 Difficulty in walking, not elsewhere classified: Secondary | ICD-10-CM | POA: Diagnosis not present

## 2022-10-04 DIAGNOSIS — M62561 Muscle wasting and atrophy, not elsewhere classified, right lower leg: Secondary | ICD-10-CM | POA: Diagnosis not present

## 2022-10-04 DIAGNOSIS — M79604 Pain in right leg: Secondary | ICD-10-CM | POA: Diagnosis not present

## 2022-10-04 DIAGNOSIS — M25561 Pain in right knee: Secondary | ICD-10-CM | POA: Diagnosis not present

## 2022-10-10 DIAGNOSIS — M25561 Pain in right knee: Secondary | ICD-10-CM | POA: Diagnosis not present

## 2022-10-10 DIAGNOSIS — M62561 Muscle wasting and atrophy, not elsewhere classified, right lower leg: Secondary | ICD-10-CM | POA: Diagnosis not present

## 2022-10-10 DIAGNOSIS — R262 Difficulty in walking, not elsewhere classified: Secondary | ICD-10-CM | POA: Diagnosis not present

## 2022-10-10 DIAGNOSIS — M799 Soft tissue disorder, unspecified: Secondary | ICD-10-CM | POA: Diagnosis not present

## 2022-10-10 DIAGNOSIS — M79604 Pain in right leg: Secondary | ICD-10-CM | POA: Diagnosis not present

## 2022-10-10 DIAGNOSIS — M62551 Muscle wasting and atrophy, not elsewhere classified, right thigh: Secondary | ICD-10-CM | POA: Diagnosis not present

## 2022-10-16 DIAGNOSIS — M25561 Pain in right knee: Secondary | ICD-10-CM | POA: Diagnosis not present

## 2022-10-16 DIAGNOSIS — M62551 Muscle wasting and atrophy, not elsewhere classified, right thigh: Secondary | ICD-10-CM | POA: Diagnosis not present

## 2022-10-16 DIAGNOSIS — M799 Soft tissue disorder, unspecified: Secondary | ICD-10-CM | POA: Diagnosis not present

## 2022-10-16 DIAGNOSIS — R262 Difficulty in walking, not elsewhere classified: Secondary | ICD-10-CM | POA: Diagnosis not present

## 2022-10-16 DIAGNOSIS — M62561 Muscle wasting and atrophy, not elsewhere classified, right lower leg: Secondary | ICD-10-CM | POA: Diagnosis not present

## 2022-10-16 DIAGNOSIS — M79604 Pain in right leg: Secondary | ICD-10-CM | POA: Diagnosis not present

## 2022-10-18 DIAGNOSIS — H2512 Age-related nuclear cataract, left eye: Secondary | ICD-10-CM | POA: Diagnosis not present

## 2022-10-19 DIAGNOSIS — H2511 Age-related nuclear cataract, right eye: Secondary | ICD-10-CM | POA: Diagnosis not present

## 2022-10-19 DIAGNOSIS — H25011 Cortical age-related cataract, right eye: Secondary | ICD-10-CM | POA: Diagnosis not present

## 2022-10-19 DIAGNOSIS — H25041 Posterior subcapsular polar age-related cataract, right eye: Secondary | ICD-10-CM | POA: Diagnosis not present

## 2022-10-24 NOTE — Progress Notes (Signed)
Aleen Sells D.[facility] [address] [place] [address] Phone: [phone]   Assessment and Plan:     There are no diagnoses linked to this encounter.  ***   Pertinent previous records reviewed include ***   Follow Up: ***     Subjective:   I, Matteo Banke, am serving as a Neurosurgeon for Doctor Richardean Sale   Chief Complaint: right leg and foot pain    HPI:    07/12/2022 Patient is 77 year old female complaining of right leg and foot pain . Patient states that she was on an antibiotic and two days after she started she started getting pain in her arch and behind her right knee, been going for a few week ,thinks her celebrex is making it feel better since its for inflammation , she does state that she is getting better each day      . female here with c/o above, with 1 wk onset unusual pain and tender soreness to the right posteromedial knee but no other knee swelling, pain ro calf pain or swelling.  Worse to stand and walk, better to sit. Now mild to mod, limps somewhat to walk, which has been more lately due to wrosening right foot plantar pain, worse in the AM for the last few wks.  No worsening lower back pain,     She has a knot on her right hand    08/02/2022 Patient states she has intermittent pain , arch pain and soreness on her leg today neck pain today and last night    08/30/2022 Patient states that she is about the same    09/27/2022 Patient states she is still flared not as bad though, pain moves depending on the day   10/25/2022 Patient states    Relevant Historical Information: Recent quinolone use,  Additional pertinent review of systems negative.   Current Outpatient Medications:    albuterol (VENTOLIN HFA) 108 (90 Base) MCG/ACT inhaler, Inhale 2 puffs into the lungs every 6 (six) hours as needed for wheezing or shortness of breath., Disp: 8 g, Rfl: 0   celecoxib (CELEBREX) 200 MG capsule, Take 1 capsule  (200 mg total) by mouth 2 (two) times daily as needed., Disp: 180 capsule, Rfl: 1   Cholecalciferol (CVS D3) 50 MCG (2000 UT) CAPS, Take by mouth. , Disp: , Rfl:    Cyanocobalamin (CVS VITAMIN B-12 PO), Take 2,500 tablets by mouth every other day., Disp: , Rfl:    EQ ALLERGY RELIEF, CETIRIZINE, 10 MG tablet, Take 1 tablet by mouth once daily, Disp: 90 tablet, Rfl: 0   fluocinonide cream (LIDEX) 0.05 %, Apply 1 application  topically 2 (two) times daily., Disp: , Rfl:    HYDROcodone bit-homatropine (HYCODAN) 5-1.5 MG/5ML syrup, Take 5 mLs by mouth every 8 (eight) hours as needed for cough., Disp: 240 mL, Rfl: 0   HYDROcodone-acetaminophen (NORCO/VICODIN) 5-325 MG tablet, Take 1 tablet by mouth every 6 (six) hours as needed., Disp: 30 tablet, Rfl: 0   levofloxacin (LEVAQUIN) 500 MG tablet, Take 1 tablet (500 mg total) by mouth daily., Disp: 10 tablet, Rfl: 0   lidocaine (LIDODERM) 5 %, Place 1 patch onto the skin daily. Remove & Discard patch within 12 hours or as directed by MD, Disp: 30 patch, Rfl: 0   meloxicam (MOBIC) 15 MG tablet, Take 1 tablet (15 mg total) by mouth daily., Disp: 21 tablet, Rfl: 0   Multiple Vitamins-Minerals (MULTIVITAMIN WITH MINERALS) tablet, Take 1  tablet by mouth daily., Disp: , Rfl:    Multiple Vitamins-Minerals (PRESERVISION AREDS 2 PO), Take by mouth., Disp: , Rfl:    pseudoephedrine (SUDAFED) 120 MG 12 hr tablet, Take 1 tablet (120 mg total) by mouth 2 (two) times daily as needed for congestion., Disp: 60 tablet, Rfl: 0   rosuvastatin (CRESTOR) 40 MG tablet, Take 1 tablet by mouth once daily, Disp: 90 tablet, Rfl: 0   triamcinolone (NASACORT) 55 MCG/ACT AERO nasal inhaler, Place 2 sprays into the nose daily., Disp: 1 each, Rfl: 2   Objective:     There were no vitals filed for this visit.    There is no height or weight on file to calculate BMI.    Physical Exam:    ***   Electronically signed by:  Aleen Sells D.[facility] 12:11 PM  10/24/22

## 2022-10-25 ENCOUNTER — Ambulatory Visit: Payer: Medicare HMO | Admitting: Sports Medicine

## 2022-10-25 VITALS — HR 82 | Ht 62.0 in | Wt 202.0 lb

## 2022-10-25 DIAGNOSIS — M25561 Pain in right knee: Secondary | ICD-10-CM

## 2022-10-25 DIAGNOSIS — Z6836 Body mass index (BMI) 36.0-36.9, adult: Secondary | ICD-10-CM | POA: Diagnosis not present

## 2022-10-25 DIAGNOSIS — M79604 Pain in right leg: Secondary | ICD-10-CM

## 2022-10-25 DIAGNOSIS — Z825 Family history of asthma and other chronic lower respiratory diseases: Secondary | ICD-10-CM | POA: Diagnosis not present

## 2022-10-25 DIAGNOSIS — Z833 Family history of diabetes mellitus: Secondary | ICD-10-CM | POA: Diagnosis not present

## 2022-10-25 DIAGNOSIS — H3530 Unspecified macular degeneration: Secondary | ICD-10-CM | POA: Diagnosis not present

## 2022-10-25 DIAGNOSIS — Z803 Family history of malignant neoplasm of breast: Secondary | ICD-10-CM | POA: Diagnosis not present

## 2022-10-25 DIAGNOSIS — J309 Allergic rhinitis, unspecified: Secondary | ICD-10-CM | POA: Diagnosis not present

## 2022-10-25 DIAGNOSIS — E785 Hyperlipidemia, unspecified: Secondary | ICD-10-CM | POA: Diagnosis not present

## 2022-10-25 DIAGNOSIS — R32 Unspecified urinary incontinence: Secondary | ICD-10-CM | POA: Diagnosis not present

## 2022-10-25 DIAGNOSIS — G8929 Other chronic pain: Secondary | ICD-10-CM | POA: Diagnosis not present

## 2022-10-25 DIAGNOSIS — M79671 Pain in right foot: Secondary | ICD-10-CM | POA: Diagnosis not present

## 2022-10-25 DIAGNOSIS — E669 Obesity, unspecified: Secondary | ICD-10-CM | POA: Diagnosis not present

## 2022-10-25 DIAGNOSIS — M545 Low back pain, unspecified: Secondary | ICD-10-CM

## 2022-10-25 DIAGNOSIS — M1990 Unspecified osteoarthritis, unspecified site: Secondary | ICD-10-CM | POA: Diagnosis not present

## 2022-10-25 DIAGNOSIS — I10 Essential (primary) hypertension: Secondary | ICD-10-CM | POA: Diagnosis not present

## 2022-10-25 DIAGNOSIS — Z008 Encounter for other general examination: Secondary | ICD-10-CM | POA: Diagnosis not present

## 2022-10-25 NOTE — Patient Instructions (Signed)
Continue tylenol as needed Continue HEP and reach out to [place] if you need PT  Low impact exercises water aerobics, stationary bike, elliptical  As needed follow up

## 2022-11-01 DIAGNOSIS — H2511 Age-related nuclear cataract, right eye: Secondary | ICD-10-CM | POA: Diagnosis not present

## 2022-11-06 ENCOUNTER — Encounter: Payer: Self-pay | Admitting: Family Medicine

## 2022-11-06 ENCOUNTER — Ambulatory Visit (INDEPENDENT_AMBULATORY_CARE_PROVIDER_SITE_OTHER): Payer: Medicare HMO | Admitting: Family Medicine

## 2022-11-06 VITALS — BP 142/80 | HR 70 | Temp 97.6°F | Ht 62.0 in | Wt 200.0 lb

## 2022-11-06 DIAGNOSIS — R350 Frequency of micturition: Secondary | ICD-10-CM | POA: Diagnosis not present

## 2022-11-06 DIAGNOSIS — H8110 Benign paroxysmal vertigo, unspecified ear: Secondary | ICD-10-CM | POA: Diagnosis not present

## 2022-11-06 DIAGNOSIS — R82998 Other abnormal findings in urine: Secondary | ICD-10-CM | POA: Diagnosis not present

## 2022-11-06 LAB — POC URINALSYSI DIPSTICK (AUTOMATED)
Bilirubin, UA: NEGATIVE
Glucose, UA: NEGATIVE
Ketones, UA: NEGATIVE
Nitrite, UA: NEGATIVE
Protein, UA: NEGATIVE
Spec Grav, UA: 1.025 (ref 1.010–1.025)
Urobilinogen, UA: 0.2 E.U./dL
pH, UA: 6 (ref 5.0–8.0)

## 2022-11-06 MED ORDER — MECLIZINE HCL 25 MG PO TABS: 25.0000 mg | ORAL_TABLET | Freq: Two times a day (BID) | ORAL | 0 refills | Status: DC | PRN
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
## 2022-11-06 NOTE — Patient Instructions (Signed)
Increase water intake.  Try meclizine.   Follow up if you are not improving or any new or worsening symptoms.   We will be in tough with your urine culture results.   Vertigo Vertigo is the feeling that you or the things around you are moving when they are not. This feeling can come and go at any time. Vertigo often goes away on its own. This condition can be dangerous if it happens when you are doing activities like driving or working with machines. Your doctor will do tests to find the cause of your vertigo. These tests will also help your doctor decide on the best treatment for you. Follow these instructions at home: Eating and drinking     Drink enough fluid to keep your pee (urine) pale yellow. Do not drink alcohol. Activity Return to your normal activities when your doctor says that it is safe. In the morning, first sit up on the side of the bed. When you feel okay, stand slowly while you hold onto something until you know that your balance is fine. Move slowly. Avoid sudden body or head movements or certain positions, as told by your doctor. Use a cane if you have trouble standing or walking. Sit down right away if you feel dizzy. Avoid doing any tasks or activities that can cause danger to you or others if you get dizzy. Avoid bending down if you feel dizzy. Place items in your home so that they are easy for you to reach without bending or leaning over. Do not drive or use machinery if you feel dizzy. General instructions Take over-the-counter and prescription medicines only as told by your doctor. Keep all follow-up visits. Contact a doctor if: Your medicine does not help your vertigo. Your problems get worse or you have new symptoms. You have a fever. You feel like you may vomit (nauseous), or this feeling gets worse. You start to vomit. Your family or friends see changes in how you act. You lose feeling (have numbness) in part of your body. You feel prickling and  tingling in a part of your body. Get help right away if: You are always dizzy. You faint. You get very bad headaches. You get a stiff neck. Bright light starts to bother you. You have trouble moving or talking. You feel weak in your hands, arms, or legs. You have changes in your hearing or in how you see (vision). These symptoms may be an emergency. Get help right away. Call your local emergency services (911 in the [place] Do not wait to see if the symptoms will go away. Do not drive yourself to the hospital. Summary Vertigo is the feeling that you or the things around you are moving when they are not. Your doctor will do tests to find the cause of your vertigo. You may be told to avoid some tasks, positions, or movements. Contact a doctor if your medicine is not helping, or if you have a fever, new symptoms, or a change in how you act. Get help right away if you get very bad headaches, or if you have changes in how you speak, hear, or see. This information is not intended to replace advice given to you by your health care provider. Make sure you discuss any questions you have with your health care provider. Document Revised: 05/18/2020 Document Reviewed: 05/18/2020 Elsevier Patient Education  2023 [employer].

## 2022-11-07 LAB — URINE CULTURE: Result:: NO GROWTH

## 2022-11-14 DIAGNOSIS — R262 Difficulty in walking, not elsewhere classified: Secondary | ICD-10-CM | POA: Diagnosis not present

## 2022-11-14 DIAGNOSIS — M25561 Pain in right knee: Secondary | ICD-10-CM | POA: Diagnosis not present

## 2022-11-14 DIAGNOSIS — M79604 Pain in right leg: Secondary | ICD-10-CM | POA: Diagnosis not present

## 2022-11-14 DIAGNOSIS — M62561 Muscle wasting and atrophy, not elsewhere classified, right lower leg: Secondary | ICD-10-CM | POA: Diagnosis not present

## 2022-11-14 DIAGNOSIS — M799 Soft tissue disorder, unspecified: Secondary | ICD-10-CM | POA: Diagnosis not present

## 2022-11-14 DIAGNOSIS — M62551 Muscle wasting and atrophy, not elsewhere classified, right thigh: Secondary | ICD-10-CM | POA: Diagnosis not present

## 2022-11-20 DIAGNOSIS — M62561 Muscle wasting and atrophy, not elsewhere classified, right lower leg: Secondary | ICD-10-CM | POA: Diagnosis not present

## 2022-11-20 DIAGNOSIS — M79604 Pain in right leg: Secondary | ICD-10-CM | POA: Diagnosis not present

## 2022-11-20 DIAGNOSIS — R262 Difficulty in walking, not elsewhere classified: Secondary | ICD-10-CM | POA: Diagnosis not present

## 2022-11-20 DIAGNOSIS — M799 Soft tissue disorder, unspecified: Secondary | ICD-10-CM | POA: Diagnosis not present

## 2022-11-20 DIAGNOSIS — M25561 Pain in right knee: Secondary | ICD-10-CM | POA: Diagnosis not present

## 2022-11-20 DIAGNOSIS — M62551 Muscle wasting and atrophy, not elsewhere classified, right thigh: Secondary | ICD-10-CM | POA: Diagnosis not present

## 2022-11-20 DIAGNOSIS — H8111 Benign paroxysmal vertigo, right ear: Secondary | ICD-10-CM | POA: Diagnosis not present

## 2022-11-22 DIAGNOSIS — H8111 Benign paroxysmal vertigo, right ear: Secondary | ICD-10-CM | POA: Diagnosis not present

## 2022-11-22 DIAGNOSIS — M62551 Muscle wasting and atrophy, not elsewhere classified, right thigh: Secondary | ICD-10-CM | POA: Diagnosis not present

## 2022-11-22 DIAGNOSIS — M62561 Muscle wasting and atrophy, not elsewhere classified, right lower leg: Secondary | ICD-10-CM | POA: Diagnosis not present

## 2022-11-22 DIAGNOSIS — R262 Difficulty in walking, not elsewhere classified: Secondary | ICD-10-CM | POA: Diagnosis not present

## 2022-11-22 DIAGNOSIS — M799 Soft tissue disorder, unspecified: Secondary | ICD-10-CM | POA: Diagnosis not present

## 2022-11-22 DIAGNOSIS — M79604 Pain in right leg: Secondary | ICD-10-CM | POA: Diagnosis not present

## 2022-11-22 DIAGNOSIS — M25561 Pain in right knee: Secondary | ICD-10-CM | POA: Diagnosis not present

## 2022-11-23 IMAGING — US US THYROID
1 series · 13 of 25 positions shown · non-contrast
Comparison: June 2016,October 2017

CLINICAL DATA: Prior ultrasound follow-up.

EXAM:
THYROID ULTRASOUND
TECHNIQUE: Ultrasound examination of the thyroid gland and adjacent soft
tissues was performed.

[Series 1: us thyroid · 0.06mm/px · 13 of 47 slices shown]
[im 1/47]
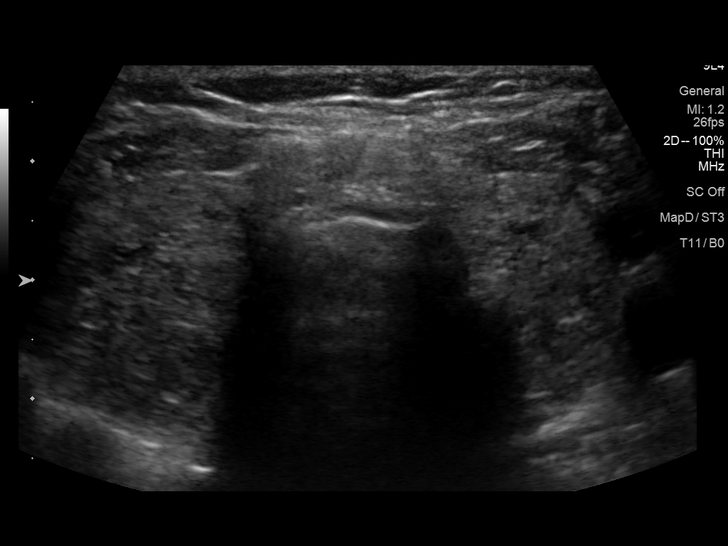
[im 4/47]
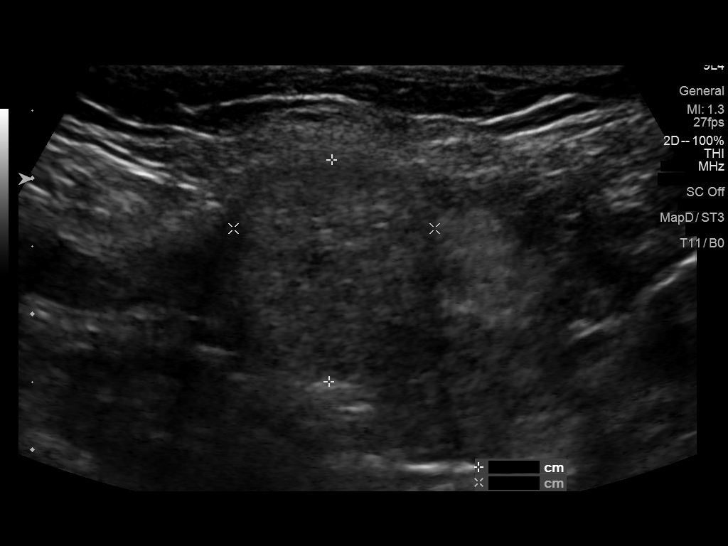
[im 8/47]
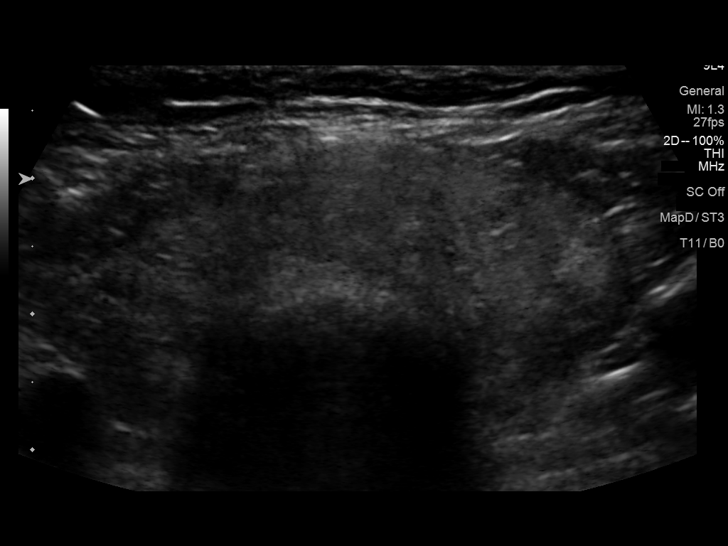
[im 12/47]
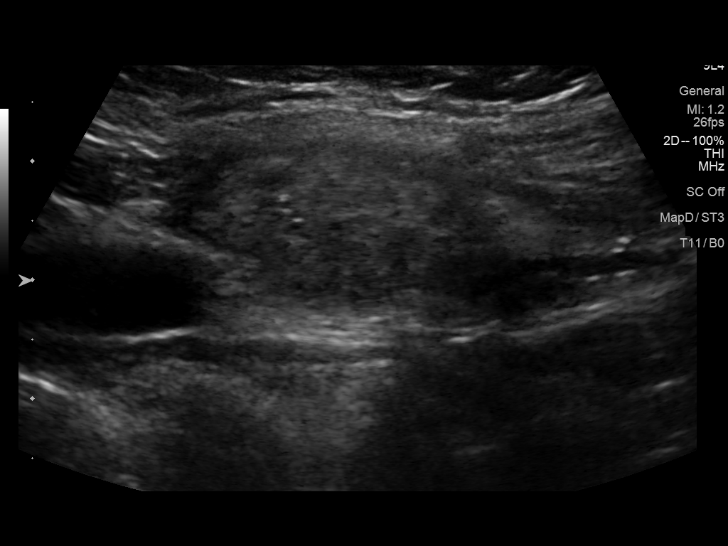
[im 16/47]
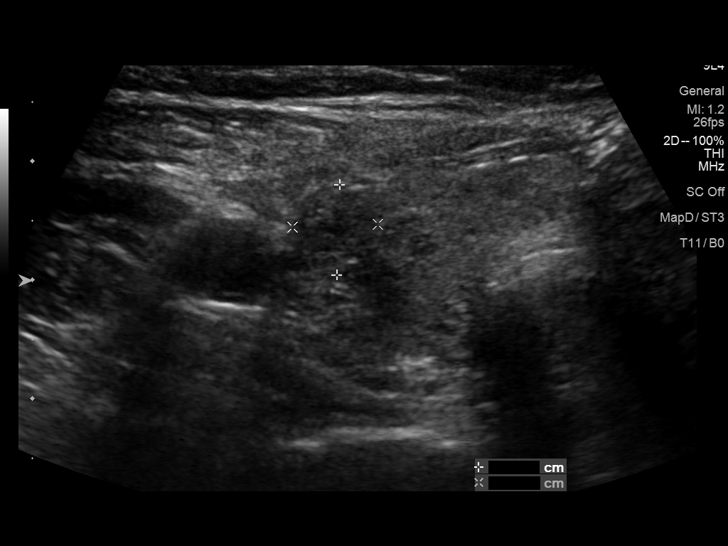
[im 20/47]
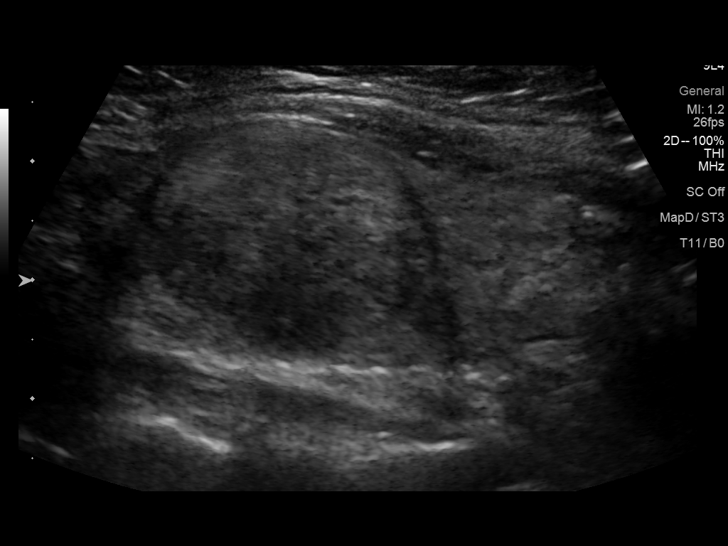
[im 24/47]
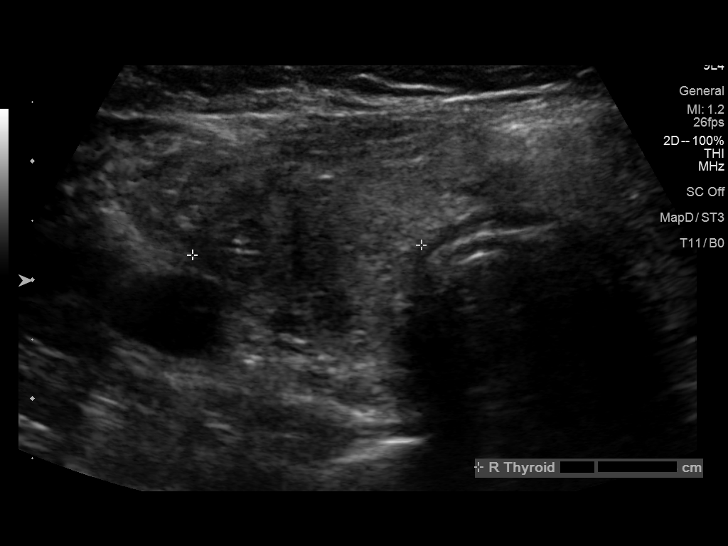
[im 27/47]
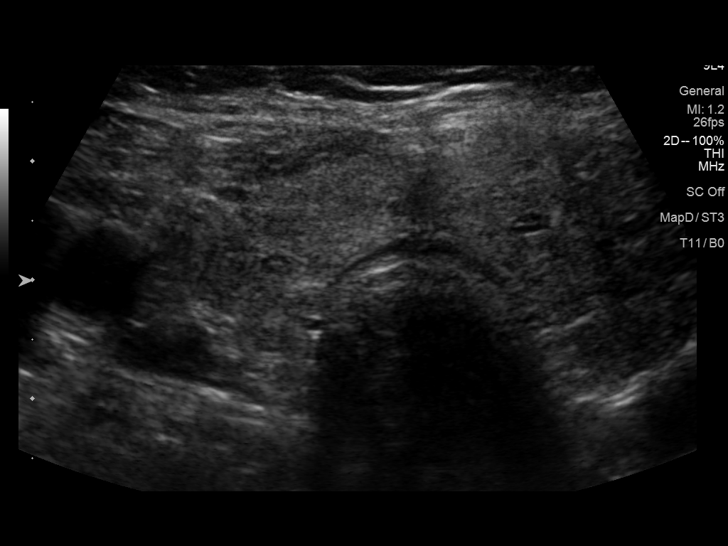
[im 31/47]
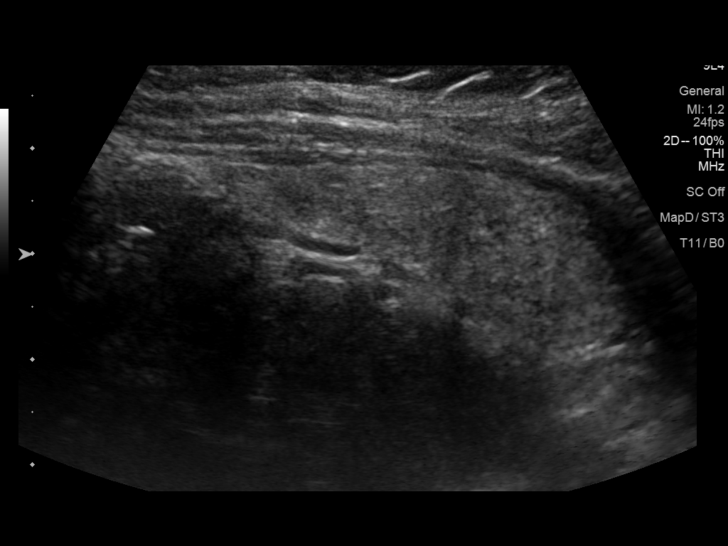
[im 35/47]
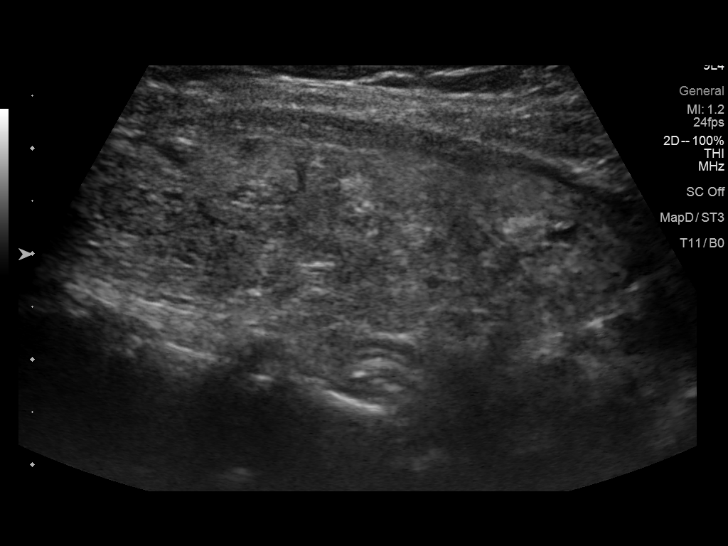
[im 39/47]
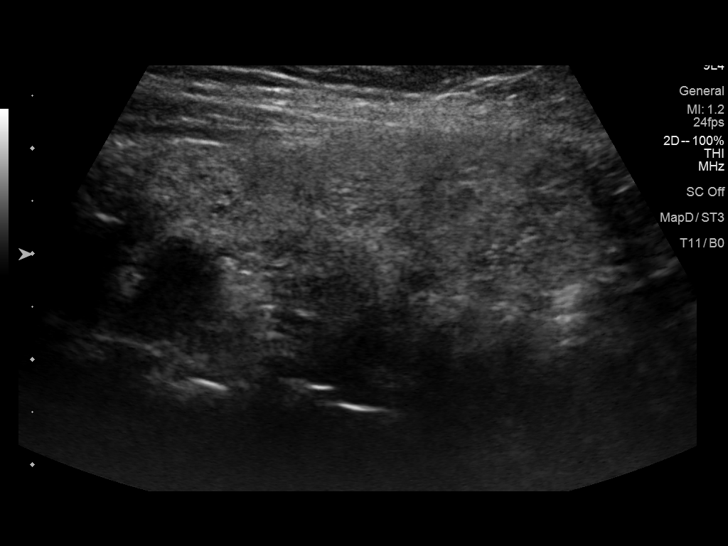
[im 43/47]
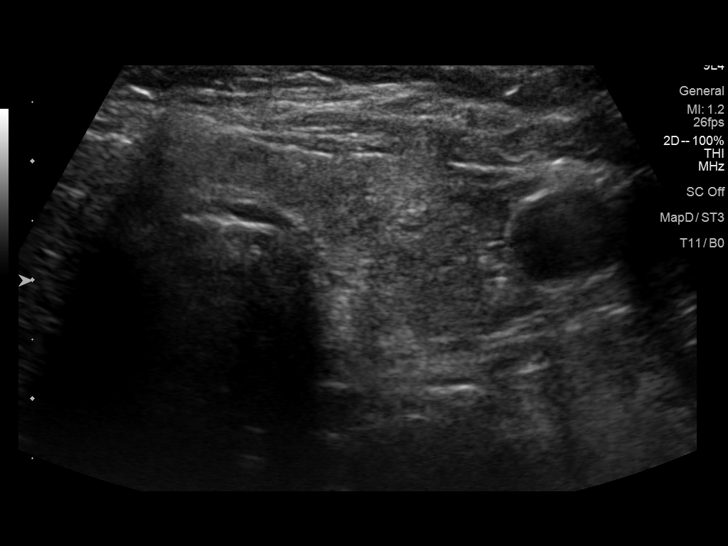
[im 47/47]
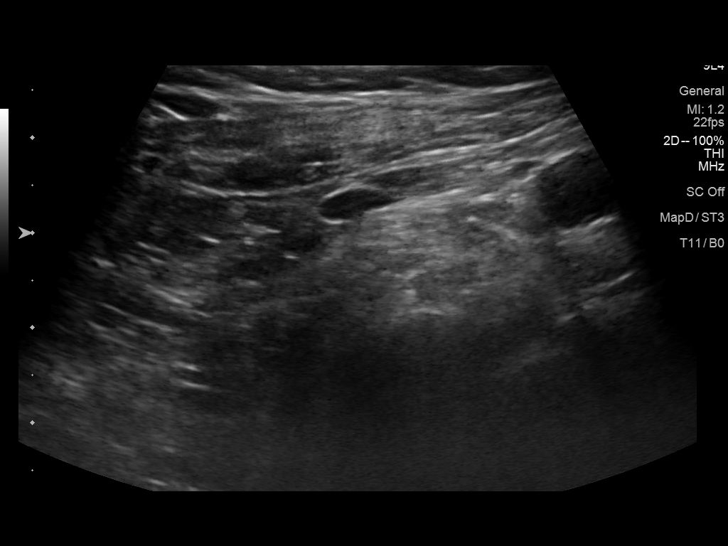

[13 of 25 positions shown; findings below may reference images not displayed]

FINDINGS: Parenchymal Echotexture: Moderately heterogenous

Isthmus: 1.0 cm

Right lobe: 5.0 x 2.2 x 1.5 cm

Left lobe: 5.2 x 1.9 x 1.8 cm

_________________________________________________________

Estimated total number of nodules >/= 1 cm: 2

Number of spongiform nodules >/=  2 cm not described below (TR1): 0

Number of mixed cystic and solid nodules >/= 1.5 cm not described
below (TR2): 0

_________________________________________________________

Nodule labeled 1 is a solid isoechoic nodule (TR 3) in the thyroid
isthmus measuring 2.0 x 1.6 x 1.5 cm, previously measuring up to
cm. *Given size (>/= 1.5 - 2.4 cm) and appearance, a follow-up
ultrasound in 1 year should be considered based on TI-RADS criteria.

Nodule labeled 2 is a solid isoechoic nodule (TR 3) in the superior
right thyroid lobe measuring 2.4 x 2.3 x 2.0 cm, previously 1.9 cm
in 2042 and 1.7 cm 8794. Given interval growth, biopsy of this
nodule is now recommended.

Nodule labeled 3 is a small subcentimeter nodule which does not meet
criteria for further dedicated follow-up or biopsy.
IMPRESSION: 1. Borderline enlarged multinodular thyroid gland.
2. Nodule labeled 2 in the superior right thyroid lobe demonstrates
interval growth now measuring 2.4 cm (previously 1.7 cm in 8794),
and now meets criteria for biopsy.
3. Nodule labeled 1 in the thyroid isthmus continues to meet
criteria for follow-up ultrasound in 1 year. This exam marks
year stability since it was first described in October 2017. Follow-up
interval of 5 years is recommended.

The above is in keeping with the ACR TI-RADS recommendations - [HOSPITAL] 8794;[DATE].

## 2022-11-24 IMAGING — DX DG CHEST 1V PORT
1 series · 1 of 1 positions shown · non-contrast
Comparison: Chest x-ray 04/16/2021, CT chest 05/24/2011

CLINICAL DATA: cp

EXAM:
PORTABLE CHEST 1 VIEW

[chest ap grid]
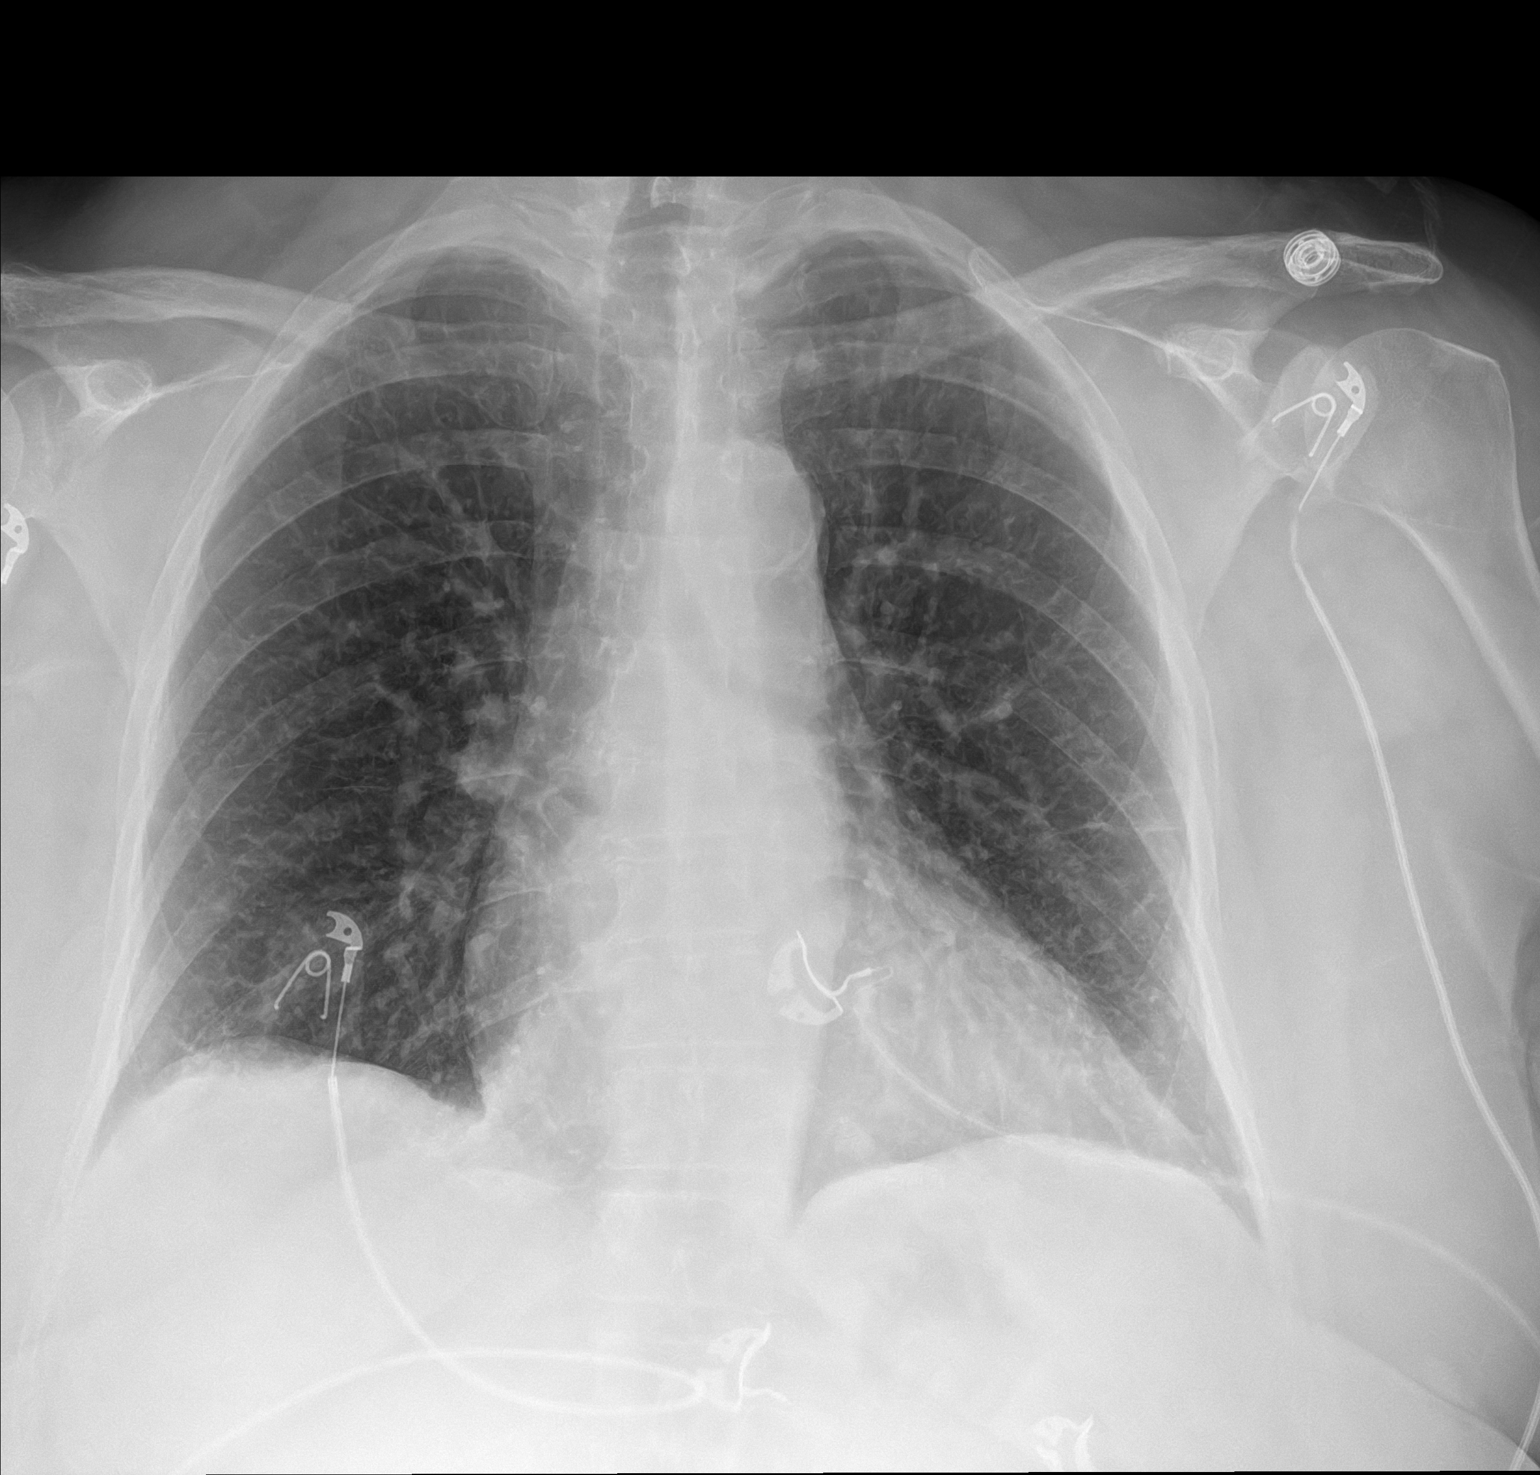

[1 of 1 positions shown; findings below may reference images not displayed]

FINDINGS: The heart and mediastinal contours are unchanged. Aortic
calcification.

Biapical pleural/pulmonary scarring. No focal consolidation. No
pulmonary edema. No pleural effusion. No pneumothorax.

No acute osseous abnormality.
IMPRESSION: 1. No active disease.
2.  Aortic Atherosclerosis ([id number]).

## 2022-11-27 DIAGNOSIS — M25561 Pain in right knee: Secondary | ICD-10-CM | POA: Diagnosis not present

## 2022-11-27 DIAGNOSIS — M62561 Muscle wasting and atrophy, not elsewhere classified, right lower leg: Secondary | ICD-10-CM | POA: Diagnosis not present

## 2022-11-27 DIAGNOSIS — M799 Soft tissue disorder, unspecified: Secondary | ICD-10-CM | POA: Diagnosis not present

## 2022-11-27 DIAGNOSIS — M79604 Pain in right leg: Secondary | ICD-10-CM | POA: Diagnosis not present

## 2022-11-27 DIAGNOSIS — H8111 Benign paroxysmal vertigo, right ear: Secondary | ICD-10-CM | POA: Diagnosis not present

## 2022-11-27 DIAGNOSIS — M62551 Muscle wasting and atrophy, not elsewhere classified, right thigh: Secondary | ICD-10-CM | POA: Diagnosis not present

## 2022-11-27 DIAGNOSIS — R262 Difficulty in walking, not elsewhere classified: Secondary | ICD-10-CM | POA: Diagnosis not present

## 2022-11-30 ENCOUNTER — Other Ambulatory Visit: Payer: Self-pay

## 2022-11-30 ENCOUNTER — Other Ambulatory Visit: Payer: Self-pay | Admitting: Internal Medicine

## 2022-11-30 DIAGNOSIS — M62551 Muscle wasting and atrophy, not elsewhere classified, right thigh: Secondary | ICD-10-CM | POA: Diagnosis not present

## 2022-11-30 DIAGNOSIS — M25561 Pain in right knee: Secondary | ICD-10-CM | POA: Diagnosis not present

## 2022-11-30 DIAGNOSIS — M79604 Pain in right leg: Secondary | ICD-10-CM | POA: Diagnosis not present

## 2022-11-30 DIAGNOSIS — H8111 Benign paroxysmal vertigo, right ear: Secondary | ICD-10-CM | POA: Diagnosis not present

## 2022-11-30 DIAGNOSIS — R262 Difficulty in walking, not elsewhere classified: Secondary | ICD-10-CM | POA: Diagnosis not present

## 2022-11-30 DIAGNOSIS — M799 Soft tissue disorder, unspecified: Secondary | ICD-10-CM | POA: Diagnosis not present

## 2022-11-30 DIAGNOSIS — M62561 Muscle wasting and atrophy, not elsewhere classified, right lower leg: Secondary | ICD-10-CM | POA: Diagnosis not present

## 2022-12-04 DIAGNOSIS — R262 Difficulty in walking, not elsewhere classified: Secondary | ICD-10-CM | POA: Diagnosis not present

## 2022-12-04 DIAGNOSIS — M79604 Pain in right leg: Secondary | ICD-10-CM | POA: Diagnosis not present

## 2022-12-04 DIAGNOSIS — M62551 Muscle wasting and atrophy, not elsewhere classified, right thigh: Secondary | ICD-10-CM | POA: Diagnosis not present

## 2022-12-04 DIAGNOSIS — M25561 Pain in right knee: Secondary | ICD-10-CM | POA: Diagnosis not present

## 2022-12-04 DIAGNOSIS — M799 Soft tissue disorder, unspecified: Secondary | ICD-10-CM | POA: Diagnosis not present

## 2022-12-04 DIAGNOSIS — M62561 Muscle wasting and atrophy, not elsewhere classified, right lower leg: Secondary | ICD-10-CM | POA: Diagnosis not present

## 2022-12-04 DIAGNOSIS — H8111 Benign paroxysmal vertigo, right ear: Secondary | ICD-10-CM | POA: Diagnosis not present

## 2022-12-07 DIAGNOSIS — M799 Soft tissue disorder, unspecified: Secondary | ICD-10-CM | POA: Diagnosis not present

## 2022-12-07 DIAGNOSIS — M79604 Pain in right leg: Secondary | ICD-10-CM | POA: Diagnosis not present

## 2022-12-07 DIAGNOSIS — R262 Difficulty in walking, not elsewhere classified: Secondary | ICD-10-CM | POA: Diagnosis not present

## 2022-12-07 DIAGNOSIS — M62561 Muscle wasting and atrophy, not elsewhere classified, right lower leg: Secondary | ICD-10-CM | POA: Diagnosis not present

## 2022-12-07 DIAGNOSIS — H8111 Benign paroxysmal vertigo, right ear: Secondary | ICD-10-CM | POA: Diagnosis not present

## 2022-12-07 DIAGNOSIS — M62551 Muscle wasting and atrophy, not elsewhere classified, right thigh: Secondary | ICD-10-CM | POA: Diagnosis not present

## 2022-12-07 DIAGNOSIS — M25561 Pain in right knee: Secondary | ICD-10-CM | POA: Diagnosis not present

## 2022-12-07 IMAGING — US US FNA BIOPSY THYROID 1ST LESION
1 series · 12 of 12 positions shown · non-contrast
Comparison: 07/27/21

MEDICATIONS:
None

COMPLICATIONS:
None immediate.

INDICATION: Indeterminate thyroid nodule

EXAM:
ULTRASOUND GUIDED FINE NEEDLE ASPIRATION OF INDETERMINATE THYROID
NODULE
TECHNIQUE: Informed written consent was obtained from the patient after a
discussion of the risks, benefits and alternatives to treatment.
Questions regarding the procedure were encouraged and answered. A
timeout was performed prior to the initiation of the procedure.

[Series 1: us fna biopsy thyroid 1st lesion · 0.06mm/px · 12 acquisitions, 12 frames shown]
[im 1/12]
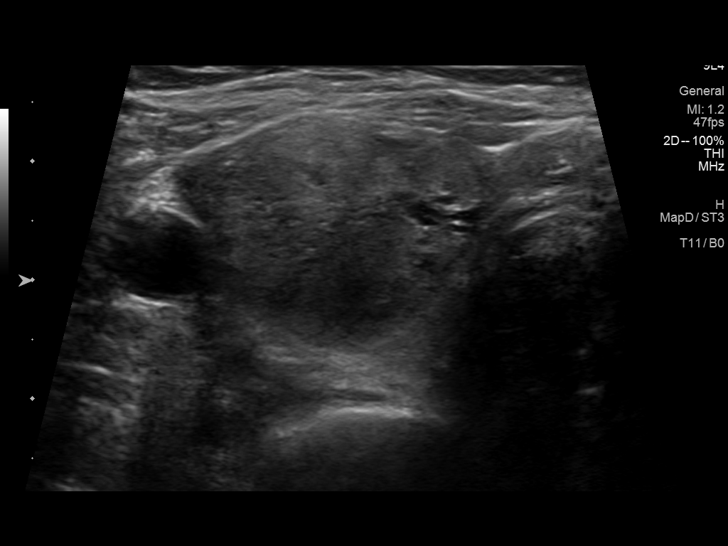
[im 2/12]
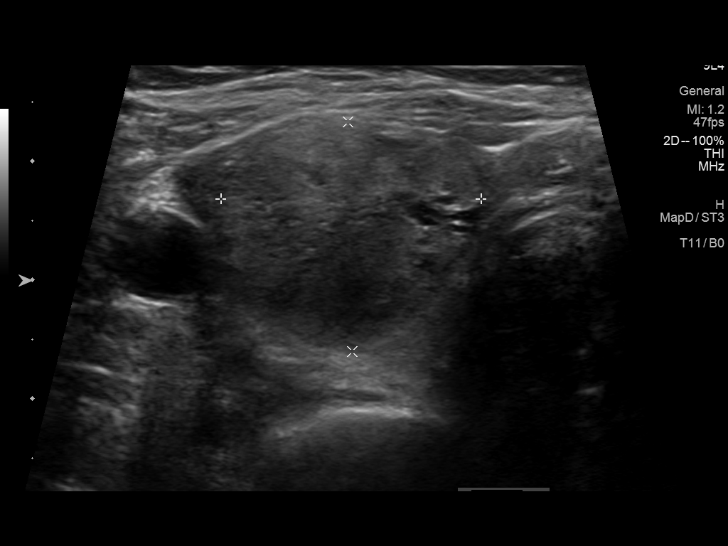
[im 3/12]
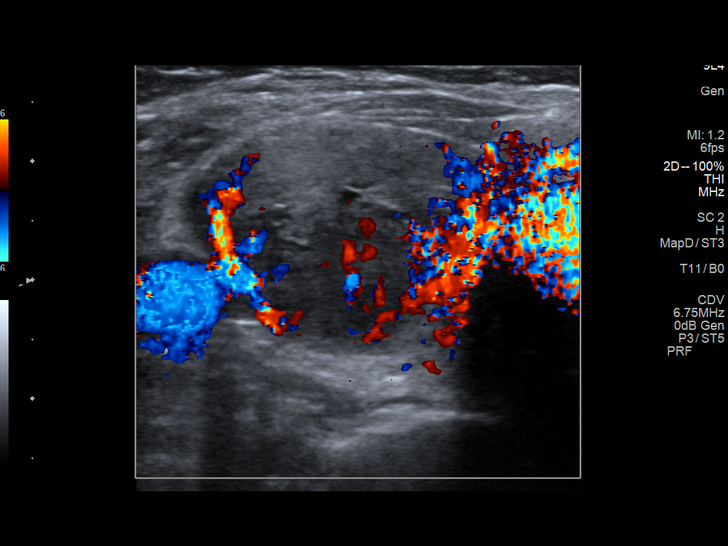
[im 4/12]
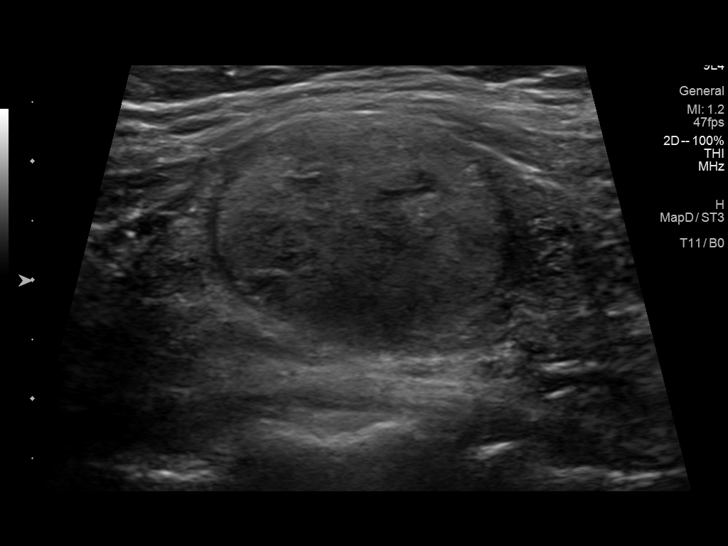
[im 5/12]
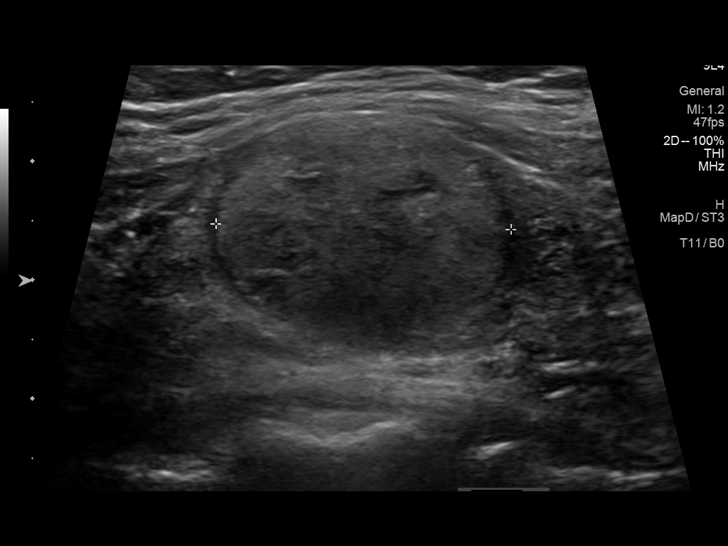
[im 6/12]
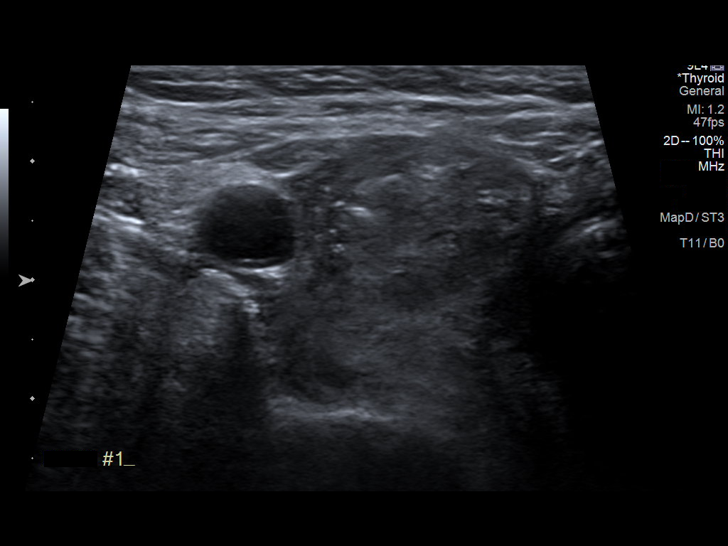
[im 7/12]
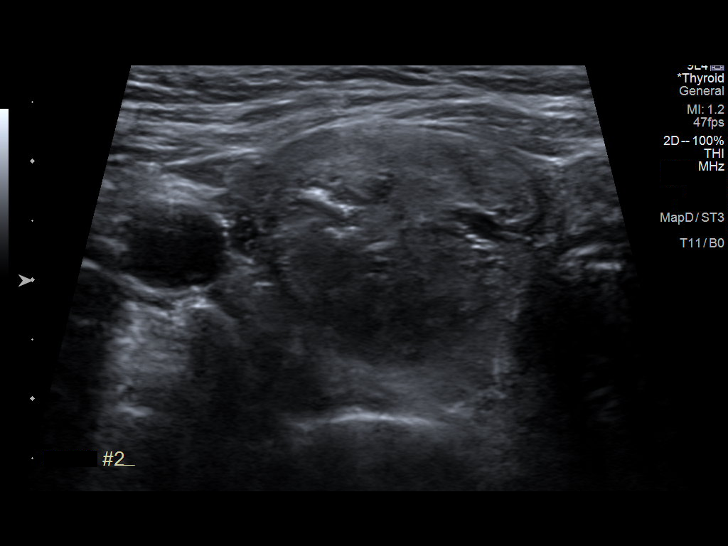
[im 8/12]
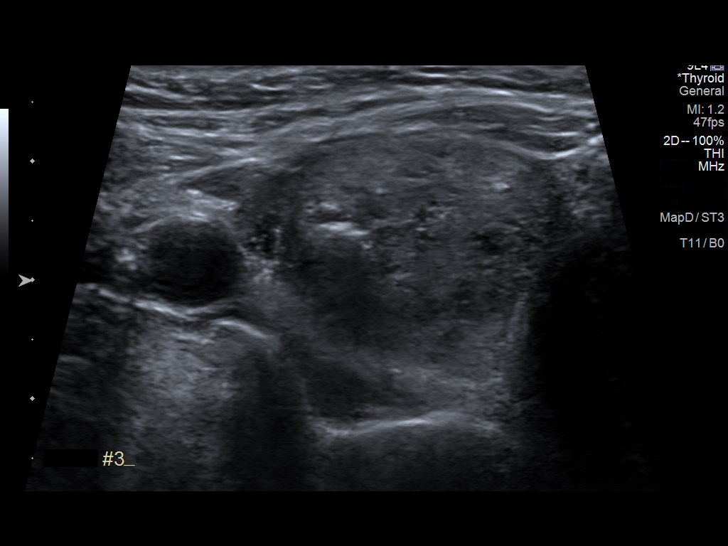
[im 9/12]
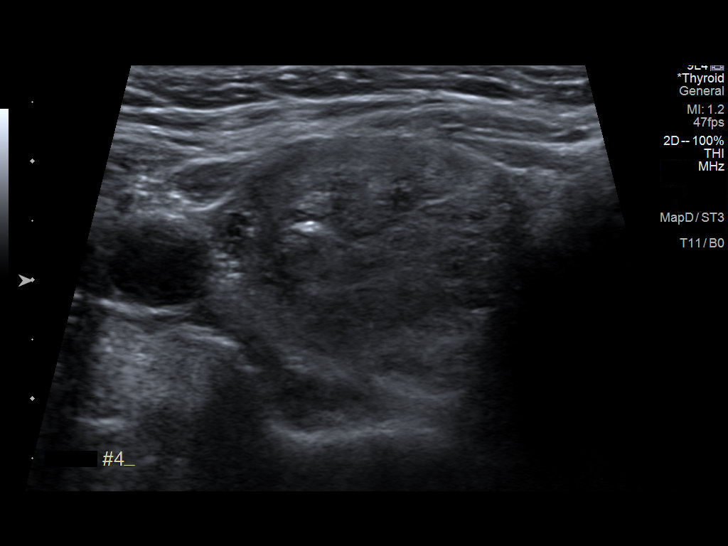
[im 10/12]
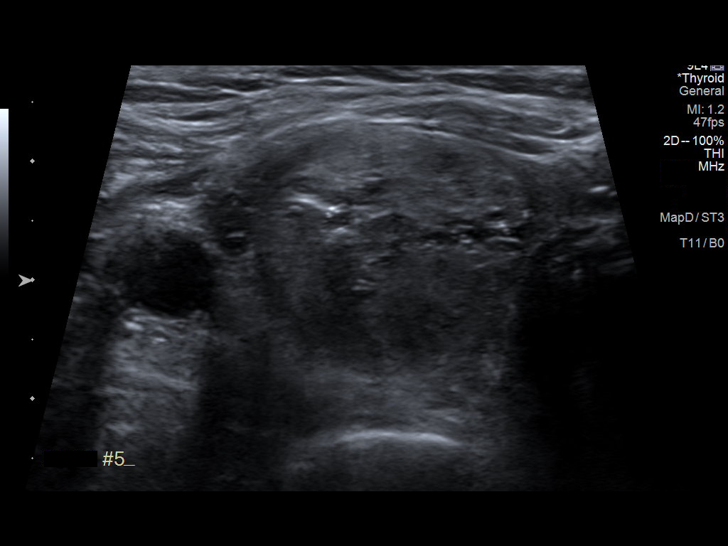
[im 11/12]
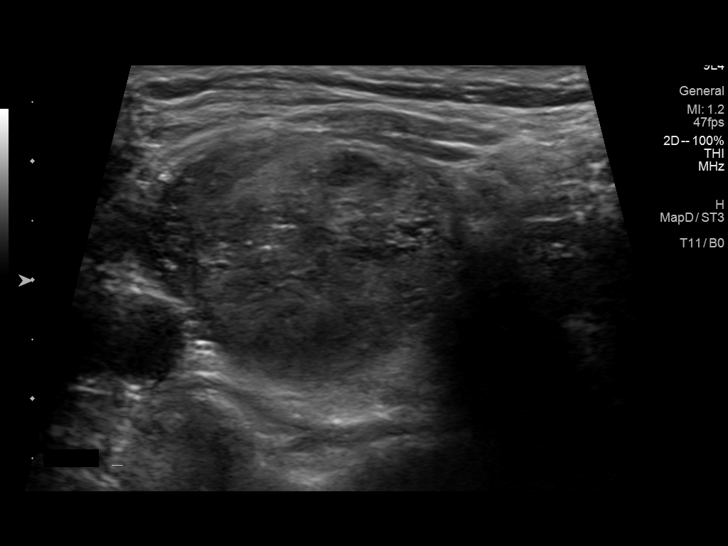
[im 12/12]
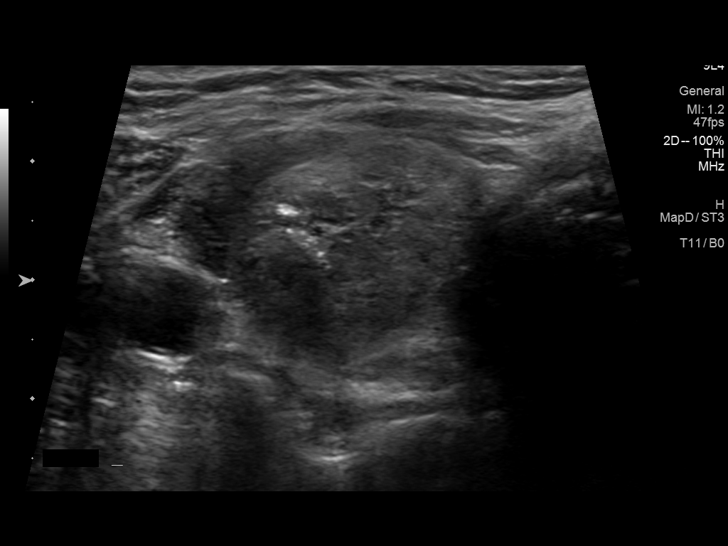

[12 of 12 positions shown; findings below may reference images not displayed]

Pre-procedural ultrasound scanning demonstrated unchanged size and
appearance of the indeterminate nodule within the right superior
lobe

The procedure was planned. The neck was prepped in the usual sterile
fashion, and a sterile drape was applied covering the operative
field. A timeout was performed prior to the initiation of the
procedure. Local anesthesia was provided with 1% lidocaine.

Under direct ultrasound guidance, 5 FNA biopsies were performed of
the nodule with a 27 gauge needle. Multiple ultrasound images were
saved for procedural documentation purposes. The samples were
prepared and submitted to pathology. Two of these specimens were
reserved for Afirma testing.

Limited post procedural scanning was negative for hematoma or
additional complication. Dressings were placed. The patient
tolerated the above procedures procedure well without immediate
postprocedural complication.
FINDINGS: Nodule reference number based on prior diagnostic ultrasound: 2

Maximum size: 2.4cm

Location: Right; Mid

ACR TI-RADS risk category: TR3 (3 points)

Reason for biopsy: meets ACR TI-RADS criteria

Ultrasound imaging confirms appropriate placement of the needles
within the thyroid nodule.
IMPRESSION: Technically successful ultrasound guided fine needle aspiration of
thyroid nodule as described above

Performed and read by Ayaga, Henneh

## 2022-12-11 DIAGNOSIS — H8111 Benign paroxysmal vertigo, right ear: Secondary | ICD-10-CM | POA: Diagnosis not present

## 2022-12-11 DIAGNOSIS — M799 Soft tissue disorder, unspecified: Secondary | ICD-10-CM | POA: Diagnosis not present

## 2022-12-11 DIAGNOSIS — M62551 Muscle wasting and atrophy, not elsewhere classified, right thigh: Secondary | ICD-10-CM | POA: Diagnosis not present

## 2022-12-11 DIAGNOSIS — R262 Difficulty in walking, not elsewhere classified: Secondary | ICD-10-CM | POA: Diagnosis not present

## 2022-12-11 DIAGNOSIS — M62561 Muscle wasting and atrophy, not elsewhere classified, right lower leg: Secondary | ICD-10-CM | POA: Diagnosis not present

## 2022-12-11 DIAGNOSIS — M25561 Pain in right knee: Secondary | ICD-10-CM | POA: Diagnosis not present

## 2022-12-11 DIAGNOSIS — M79604 Pain in right leg: Secondary | ICD-10-CM | POA: Diagnosis not present

## 2022-12-14 DIAGNOSIS — M62551 Muscle wasting and atrophy, not elsewhere classified, right thigh: Secondary | ICD-10-CM | POA: Diagnosis not present

## 2022-12-14 DIAGNOSIS — M799 Soft tissue disorder, unspecified: Secondary | ICD-10-CM | POA: Diagnosis not present

## 2022-12-14 DIAGNOSIS — R262 Difficulty in walking, not elsewhere classified: Secondary | ICD-10-CM | POA: Diagnosis not present

## 2022-12-14 DIAGNOSIS — M25561 Pain in right knee: Secondary | ICD-10-CM | POA: Diagnosis not present

## 2022-12-14 DIAGNOSIS — M62561 Muscle wasting and atrophy, not elsewhere classified, right lower leg: Secondary | ICD-10-CM | POA: Diagnosis not present

## 2022-12-14 DIAGNOSIS — H8111 Benign paroxysmal vertigo, right ear: Secondary | ICD-10-CM | POA: Diagnosis not present

## 2022-12-14 DIAGNOSIS — M79604 Pain in right leg: Secondary | ICD-10-CM | POA: Diagnosis not present

## 2022-12-18 DIAGNOSIS — M799 Soft tissue disorder, unspecified: Secondary | ICD-10-CM | POA: Diagnosis not present

## 2022-12-18 DIAGNOSIS — M79604 Pain in right leg: Secondary | ICD-10-CM | POA: Diagnosis not present

## 2022-12-18 DIAGNOSIS — H8111 Benign paroxysmal vertigo, right ear: Secondary | ICD-10-CM | POA: Diagnosis not present

## 2022-12-18 DIAGNOSIS — M62551 Muscle wasting and atrophy, not elsewhere classified, right thigh: Secondary | ICD-10-CM | POA: Diagnosis not present

## 2022-12-18 DIAGNOSIS — R262 Difficulty in walking, not elsewhere classified: Secondary | ICD-10-CM | POA: Diagnosis not present

## 2022-12-18 DIAGNOSIS — M25561 Pain in right knee: Secondary | ICD-10-CM | POA: Diagnosis not present

## 2022-12-18 DIAGNOSIS — M62561 Muscle wasting and atrophy, not elsewhere classified, right lower leg: Secondary | ICD-10-CM | POA: Diagnosis not present

## 2022-12-24 ENCOUNTER — Ambulatory Visit (INDEPENDENT_AMBULATORY_CARE_PROVIDER_SITE_OTHER): Payer: Medicare HMO

## 2022-12-24 VITALS — Ht 62.0 in | Wt 196.0 lb

## 2022-12-24 DIAGNOSIS — Z0000 Encounter for general adult medical examination without abnormal findings: Secondary | ICD-10-CM | POA: Diagnosis not present

## 2022-12-24 NOTE — Patient Instructions (Addendum)
Debra Atkinson , Thank you for taking time to come for your Medicare Wellness Visit. I appreciate your ongoing commitment to your health goals. Please review the following plan we discussed and let me know if I can assist you in the future.   These are the goals we discussed:  Goals      My healthcare goal for 2024 is to exercise and walk more and continue to eat healthy.        This is a list of the screening recommended for you and due dates:  Health Maintenance  Topic Date Due   Zoster (Shingles) Vaccine (1 of 2) Never done   Eye exam for diabetics  08/02/2022   Hemoglobin A1C  01/02/2023   Complete foot exam   01/17/2023   Flu Shot  01/31/2023   Yearly kidney function blood test for diabetes  07/05/2023   Yearly kidney health urinalysis for diabetes  07/05/2023   Medicare Annual Wellness Visit  12/24/2023   Colon Cancer Screening  02/23/2027   DTaP/Tdap/Td vaccine (3 - Td or Tdap) 12/08/2030   Pneumonia Vaccine  Completed   DEXA scan (bone density measurement)  Completed   Hepatitis C Screening  Completed   HPV Vaccine  Aged Out   COVID-19 Vaccine  Discontinued    Advanced directives: No; Advance directive discussed with you today. Even though you declined this today please call our office should you change your mind and we can give you the proper paperwork for you to fill out.  Conditions/risks identified: Yes  Next appointment: It was nice speaking with you today!  Please follow up in one year for your annual wellness visit via telephone call with Nurse Percell Miller on 12/31/2023 at 9:45 a.m.  If you need to cancel or reschedule please call [phone].  Preventive Care 69 Years and Older, Female Preventive care refers to lifestyle choices and visits with your health care provider that can promote health and wellness. What does preventive care include? A yearly physical exam. This is also called an annual well check. Dental exams once or twice a year. Routine eye exams. Ask  your health care provider how often you should have your eyes checked. Personal lifestyle choices, including: Daily care of your teeth and gums. Regular physical activity. Eating a healthy diet. Avoiding tobacco and drug use. Limiting alcohol use. Practicing safe sex. Taking low-dose aspirin every day. Taking vitamin and mineral supplements as recommended by your health care provider. What happens during an annual well check? The services and screenings done by your health care provider during your annual well check will depend on your age, overall health, lifestyle risk factors, and family history of disease. Counseling  Your health care provider may ask you questions about your: Alcohol use. Tobacco use. Drug use. Emotional well-being. Home and relationship well-being. Sexual activity. Eating habits. History of falls. Memory and ability to understand (cognition). Work and work Astronomer. Reproductive health. Screening  You may have the following tests or measurements: Height, weight, and BMI. Blood pressure. Lipid and cholesterol levels. These may be checked every 5 years, or more frequently if you are over 76 years old. Skin check. Lung cancer screening. You may have this screening every year starting at age 3 if you have a 30-pack-year history of smoking and currently smoke or have quit within the past 15 years. Fecal occult blood test (FOBT) of the stool. You may have this test every year starting at age 28. Flexible sigmoidoscopy or colonoscopy. You may  have a sigmoidoscopy every 5 years or a colonoscopy every 10 years starting at age 58. Hepatitis C blood test. Hepatitis B blood test. Sexually transmitted disease (STD) testing. Diabetes screening. This is done by checking your blood sugar (glucose) after you have not eaten for a while (fasting). You may have this done every 1-3 years. Bone density scan. This is done to screen for osteoporosis. You may have this done  starting at age 22. Mammogram. This may be done every 1-2 years. Talk to your health care provider about how often you should have regular mammograms. Talk with your health care provider about your test results, treatment options, and if necessary, the need for more tests. Vaccines  Your health care provider may recommend certain vaccines, such as: Influenza vaccine. This is recommended every year. Tetanus, diphtheria, and acellular pertussis (Tdap, Td) vaccine. You may need a Td booster every 10 years. Zoster vaccine. You may need this after age 35. Pneumococcal 13-valent conjugate (PCV13) vaccine. One dose is recommended after age 23. Pneumococcal polysaccharide (PPSV23) vaccine. One dose is recommended after age 94. Talk to your health care provider about which screenings and vaccines you need and how often you need them. This information is not intended to replace advice given to you by your health care provider. Make sure you discuss any questions you have with your health care provider. Document Released: 07/15/2015 Document Revised: 03/07/2016 Document Reviewed: 04/19/2015 Elsevier Interactive Patient Education  2017 [employer].  Fall Prevention in the Home Falls can cause injuries. They can happen to people of all ages. There are many things you can do to make your home safe and to help prevent falls. What can I do on the outside of my home? Regularly fix the edges of walkways and driveways and fix any cracks. Remove anything that might make you trip as you walk through a door, such as a raised step or threshold. Trim any bushes or trees on the path to your home. Use bright outdoor lighting. Clear any walking paths of anything that might make someone trip, such as rocks or tools. Regularly check to see if handrails are loose or broken. Make sure that both sides of any steps have handrails. Any raised decks and porches should have guardrails on the edges. Have any leaves, snow, or  ice cleared regularly. Use sand or salt on walking paths during winter. Clean up any spills in your garage right away. This includes oil or grease spills. What can I do in the bathroom? Use night lights. Install grab bars by the toilet and in the tub and shower. Do not use towel bars as grab bars. Use non-skid mats or decals in the tub or shower. If you need to sit down in the shower, use a plastic, non-slip stool. Keep the floor dry. Clean up any water that spills on the floor as soon as it happens. Remove soap buildup in the tub or shower regularly. Attach bath mats securely with double-sided non-slip rug tape. Do not have throw rugs and other things on the floor that can make you trip. What can I do in the bedroom? Use night lights. Make sure that you have a light by your bed that is easy to reach. Do not use any sheets or blankets that are too big for your bed. They should not hang down onto the floor. Have a firm chair that has side arms. You can use this for support while you get dressed. Do not have throw rugs  and other things on the floor that can make you trip. What can I do in the kitchen? Clean up any spills right away. Avoid walking on wet floors. Keep items that you use a lot in easy-to-reach places. If you need to reach something above you, use a strong step stool that has a grab bar. Keep electrical cords out of the way. Do not use floor polish or wax that makes floors slippery. If you must use wax, use non-skid floor wax. Do not have throw rugs and other things on the floor that can make you trip. What can I do with my stairs? Do not leave any items on the stairs. Make sure that there are handrails on both sides of the stairs and use them. Fix handrails that are broken or loose. Make sure that handrails are as long as the stairways. Check any carpeting to make sure that it is firmly attached to the stairs. Fix any carpet that is loose or worn. Avoid having throw rugs at  the top or bottom of the stairs. If you do have throw rugs, attach them to the floor with carpet tape. Make sure that you have a light switch at the top of the stairs and the bottom of the stairs. If you do not have them, ask someone to add them for you. What else can I do to help prevent falls? Wear shoes that: Do not have high heels. Have rubber bottoms. Are comfortable and fit you well. Are closed at the toe. Do not wear sandals. If you use a stepladder: Make sure that it is fully opened. Do not climb a closed stepladder. Make sure that both sides of the stepladder are locked into place. Ask someone to hold it for you, if possible. Clearly mark and make sure that you can see: Any grab bars or handrails. First and last steps. Where the edge of each step is. Use tools that help you move around (mobility aids) if they are needed. These include: Canes. Walkers. Scooters. Crutches. Turn on the lights when you go into a dark area. Replace any light bulbs as soon as they burn out. Set up your furniture so you have a clear path. Avoid moving your furniture around. If any of your floors are uneven, fix them. If there are any pets around you, be aware of where they are. Review your medicines with your doctor. Some medicines can make you feel dizzy. This can increase your chance of falling. Ask your doctor what other things that you can do to help prevent falls. This information is not intended to replace advice given to you by your health care provider. Make sure you discuss any questions you have with your health care provider. Document Released: 04/14/2009 Document Revised: 11/24/2015 Document Reviewed: 07/23/2014 Elsevier Interactive Patient Education  2017 [employer].
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
## 2023-01-02 ENCOUNTER — Ambulatory Visit: Payer: Medicare HMO | Admitting: Sports Medicine

## 2023-01-02 VITALS — HR 61 | Ht 62.0 in | Wt 200.0 lb

## 2023-01-02 DIAGNOSIS — M1711 Unilateral primary osteoarthritis, right knee: Secondary | ICD-10-CM

## 2023-01-02 DIAGNOSIS — M25571 Pain in right ankle and joints of right foot: Secondary | ICD-10-CM | POA: Diagnosis not present

## 2023-01-02 DIAGNOSIS — M79605 Pain in left leg: Secondary | ICD-10-CM

## 2023-01-02 DIAGNOSIS — M545 Low back pain, unspecified: Secondary | ICD-10-CM

## 2023-01-02 DIAGNOSIS — G8929 Other chronic pain: Secondary | ICD-10-CM | POA: Diagnosis not present

## 2023-01-02 DIAGNOSIS — M79604 Pain in right leg: Secondary | ICD-10-CM

## 2023-01-02 DIAGNOSIS — M25561 Pain in right knee: Secondary | ICD-10-CM | POA: Diagnosis not present

## 2023-01-02 NOTE — Patient Instructions (Addendum)
Good to see you  - Start Tylenol 500 to 1000 mg 1-2 times a day for day-to-day pain relief - Recommend hydrating well with at least 6 to 8 cups of water daily to hopefully help decrease cramping episodes - Restart home exercise program - Additional referral to physical therapy has been sent  Follow-up as needed.  You may call to make an appointment if you have recurrent pain they would like to discuss or if you want to consider injections.

## 2023-01-02 NOTE — Progress Notes (Signed)
Debra Atkinson D.[facility] [address] [place] [address] Phone: [phone]   Assessment and Plan:     1. Chronic bilateral low back pain without sciatica 2. Right leg pain 3. Chronic pain of right knee 4. Primary osteoarthritis of right knee 5. Chronic pain of right ankle 6. Left leg pain -Chronic with exacerbation, subsequent visit - Patient has had recurrent flare of pain primarily in right knee, low back, and intermittent cramping in bilateral thighs - Patient has been receiving benefit with conservative therapy, and elects to continue with conservative therapy today - Start Tylenol 500 to 1000 mg 1-2 times a day for day-to-day pain relief - Recommend hydrating well with at least 6 to 8 cups of water daily to hopefully help decrease cramping episodes - Restart home exercise program - Additional referral to physical therapy has been sent   Pertinent previous records reviewed include physical therapy note 11/27/2022, physical therapy note 11/22/2022, physical therapy note 11/20/2022, physical therapy note 11/14/2022   Follow Up: As needed if no improvement or worsening of symptoms.  Could consider short-term NSAID course versus CSI   Subjective:   I, Debra Atkinson, am serving as a Neurosurgeon for Doctor Richardean Sale   Chief Complaint: right leg and foot pain    HPI:    07/12/2022 Patient is 77 year old female complaining of right leg and foot pain . Patient states that she was on an antibiotic and two days after she started she started getting pain in her arch and behind her right knee, been going for a few week ,thinks her celebrex is making it feel better since its for inflammation , she does state that she is getting better each day      . female here with c/o above, with 1 wk onset unusual pain and tender soreness to the right posteromedial knee but no other knee swelling, pain ro calf pain or swelling.  Worse to stand and walk,  better to sit. Now mild to mod, limps somewhat to walk, which has been more lately due to wrosening right foot plantar pain, worse in the AM for the last few wks.  No worsening lower back pain,     She has a knot on her right hand    08/02/2022 Patient states she has intermittent pain , arch pain and soreness on her leg today neck pain today and last night    08/30/2022 Patient states that she is about the same    09/27/2022 Patient states she is still flared not as bad though, pain moves depending on the day    10/25/2022 Patient states she is okay because she hasn't been active , is now complaining of dizziness   01/02/2023 Patient states that she has been cramping in her thighs but it does relieve after a while. She now has left leg pain and does feel like her right knee has improved. Would like another referral to PT    Relevant Historical Information: Recent quinolone use,  Additional pertinent review of systems negative.   Current Outpatient Medications:    albuterol (VENTOLIN HFA) 108 (90 Base) MCG/ACT inhaler, Inhale 2 puffs into the lungs every 6 (six) hours as needed for wheezing or shortness of breath., Disp: 8 g, Rfl: 0   celecoxib (CELEBREX) 200 MG capsule, Take 1 capsule (200 mg total) by mouth 2 (two) times daily as needed. (Patient not taking: Reported on 11/06/2022), Disp: 180 capsule, Rfl: 1   Cholecalciferol (  CVS D3) 50 MCG (2000 UT) CAPS, Take by mouth. , Disp: , Rfl:    Cyanocobalamin (CVS VITAMIN B-12 PO), Take 2,500 tablets by mouth every other day. (Patient not taking: Reported on 11/06/2022), Disp: , Rfl:    EQ ALLERGY RELIEF, CETIRIZINE, 10 MG tablet, Take 1 tablet by mouth once daily, Disp: 90 tablet, Rfl: 0   fluocinonide cream (LIDEX) 0.05 %, Apply 1 application  topically 2 (two) times daily., Disp: , Rfl:    gatifloxacin (ZYMAXID) 0.5 % SOLN, Place 1 drop into the right eye 4 (four) times daily., Disp: , Rfl:    ketorolac (ACULAR) 0.5 % ophthalmic solution, Place 1  drop into the right eye 4 (four) times daily., Disp: , Rfl:    lidocaine (LIDODERM) 5 %, Place 1 patch onto the skin daily. Remove & Discard patch within 12 hours or as directed by MD, Disp: 30 patch, Rfl: 0   meclizine (ANTIVERT) 25 MG tablet, Take 1 tablet (25 mg total) by mouth 2 (two) times daily as needed for dizziness., Disp: 30 tablet, Rfl: 0   Multiple Vitamins-Minerals (MULTIVITAMIN WITH MINERALS) tablet, Take 1 tablet by mouth daily., Disp: , Rfl:    Multiple Vitamins-Minerals (PRESERVISION AREDS 2 PO), Take by mouth., Disp: , Rfl:    prednisoLONE acetate (PRED FORTE) 1 % ophthalmic suspension, Place 1 drop into the right eye 4 (four) times daily., Disp: , Rfl:    rosuvastatin (CRESTOR) 40 MG tablet, Take 1 tablet by mouth once daily, Disp: 90 tablet, Rfl: 3   triamcinolone (NASACORT) 55 MCG/ACT AERO nasal inhaler, Place 2 sprays into the nose daily., Disp: 1 each, Rfl: 2   Objective:     Vitals:   01/02/23 0941  Pulse: 61  SpO2: 97%  Weight: 200 lb (90.7 kg)  Height: 5\' 2"  (1.575 m)      Body mass index is 36.58 kg/m.    Physical Exam:    General:  awake, alert oriented, no acute distress nontoxic Skin: no suspicious lesions or rashes Neuro:sensation intact and strength 5/5 with no deficits, no atrophy, normal muscle tone Psych: No signs of anxiety, depression or other mood disorder   Right knee, ankle, foot: Generalized lower extremity edema and swelling similar to left leg No deformity Neg fluid wave, joint milking Knee ROM Flex 100, Ext 0 TTP mildly posterior fossa and along distal hamstring tendon and proximal gastroc, plantar fascia, posterior to medial malleolus NTTP over the quad tendon, medial fem condyle, lat fem condyle, patella, plica, patella tendon, tibial tuberostiy, fibular head, posterior fossa, pes anserine bursa, gerdy's tubercle, medial jt line, lateral jt line Negative Thessaly      Electronically signed by:  Debra Atkinson D.[facility]  [facility] 10:58 AM 01/02/23

## 2023-01-11 ENCOUNTER — Other Ambulatory Visit (INDEPENDENT_AMBULATORY_CARE_PROVIDER_SITE_OTHER): Payer: Medicare HMO

## 2023-01-11 DIAGNOSIS — E559 Vitamin D deficiency, unspecified: Secondary | ICD-10-CM | POA: Diagnosis not present

## 2023-01-11 DIAGNOSIS — R7303 Prediabetes: Secondary | ICD-10-CM | POA: Diagnosis not present

## 2023-01-11 LAB — VITAMIN D 25 HYDROXY (VIT D DEFICIENCY, FRACTURES): VITD: 40.94 ng/mL (ref 30.00–100.00)

## 2023-01-11 LAB — LIPID PANEL
Cholesterol: 166 mg/dL (ref 0–200)
HDL: 49.1 mg/dL (ref >39.00)
NonHDL: 117.36
Total CHOL/HDL Ratio: 3
Triglycerides: 309 mg/dL — ABNORMAL HIGH (ref 0.0–149.0)
VLDL: 61.8 mg/dL — ABNORMAL HIGH (ref 0.0–40.0)

## 2023-01-11 LAB — HEPATIC FUNCTION PANEL
ALT: 24 U/L (ref 0–35)
AST: 20 U/L (ref 0–37)
Albumin: 4.2 g/dL (ref 3.5–5.2)
Alkaline Phosphatase: 82 U/L (ref 39–117)
Bilirubin, Direct: 0 mg/dL (ref 0.0–0.3)
Total Bilirubin: 0.4 mg/dL (ref 0.2–1.2)
Total Protein: 7.3 g/dL (ref 6.0–8.3)

## 2023-01-11 LAB — BASIC METABOLIC PANEL
BUN: 20 mg/dL (ref 6–23)
CO2: 31 mEq/L (ref 19–32)
Calcium: 10.3 mg/dL (ref 8.4–10.5)
Chloride: 102 mEq/L (ref 96–112)
Creatinine, Ser: 0.75 mg/dL (ref 0.40–1.20)
GFR: 77.08 mL/min (ref >60.00)
Glucose, Bld: 99 mg/dL (ref 70–99)
Potassium: 4.2 mEq/L (ref 3.5–5.1)
Sodium: 139 mEq/L (ref 135–145)

## 2023-01-11 LAB — HEMOGLOBIN A1C: Hgb A1c MFr Bld: 6.3 % (ref 4.6–6.5)

## 2023-01-11 LAB — LDL CHOLESTEROL, DIRECT: Direct LDL: 75 mg/dL

## 2023-01-17 ENCOUNTER — Ambulatory Visit (INDEPENDENT_AMBULATORY_CARE_PROVIDER_SITE_OTHER): Payer: Medicare HMO | Admitting: Internal Medicine

## 2023-01-17 ENCOUNTER — Encounter: Payer: Self-pay | Admitting: Internal Medicine

## 2023-01-17 VITALS — BP 118/64 | HR 85 | Temp 98.9°F | Ht 62.0 in | Wt 201.0 lb

## 2023-01-17 DIAGNOSIS — E538 Deficiency of other specified B group vitamins: Secondary | ICD-10-CM

## 2023-01-17 DIAGNOSIS — M62551 Muscle wasting and atrophy, not elsewhere classified, right thigh: Secondary | ICD-10-CM | POA: Diagnosis not present

## 2023-01-17 DIAGNOSIS — M799 Soft tissue disorder, unspecified: Secondary | ICD-10-CM | POA: Diagnosis not present

## 2023-01-17 DIAGNOSIS — M79604 Pain in right leg: Secondary | ICD-10-CM | POA: Diagnosis not present

## 2023-01-17 DIAGNOSIS — M25561 Pain in right knee: Secondary | ICD-10-CM | POA: Diagnosis not present

## 2023-01-17 DIAGNOSIS — R7303 Prediabetes: Secondary | ICD-10-CM

## 2023-01-17 DIAGNOSIS — E7800 Pure hypercholesterolemia, unspecified: Secondary | ICD-10-CM | POA: Diagnosis not present

## 2023-01-17 DIAGNOSIS — E559 Vitamin D deficiency, unspecified: Secondary | ICD-10-CM | POA: Diagnosis not present

## 2023-01-17 DIAGNOSIS — R262 Difficulty in walking, not elsewhere classified: Secondary | ICD-10-CM | POA: Diagnosis not present

## 2023-01-17 DIAGNOSIS — M62561 Muscle wasting and atrophy, not elsewhere classified, right lower leg: Secondary | ICD-10-CM | POA: Diagnosis not present

## 2023-01-17 DIAGNOSIS — H8111 Benign paroxysmal vertigo, right ear: Secondary | ICD-10-CM | POA: Diagnosis not present

## 2023-01-17 NOTE — Patient Instructions (Signed)
Please continue all other medications as before, and refills have been done if requested.  Please let us know if you change your mind about starting the [facility] weekly shots for sugar and wt loss  Please have the pharmacy call with any other refills you may need.  Please continue your efforts at being more active, low cholesterol diet, and weight control.  Please keep your appointments with your specialists as you may have planned  Please make an Appointment to return in 6 months, or sooner if needed, also with Lab Appointment for testing done 3-5 days before at the FIRST FLOOR Lab (so this is for TWO appointments - please see the scheduling desk as you leave)

## 2023-01-17 NOTE — Progress Notes (Unsigned)
Patient ID: Debra Atkinson, female   DOB: 1946-04-11, 77 y.o.   MRN: [id number]        Chief Complaint: follow up HTN, HLD and hyperglycemia ***       HPI:  Debra Atkinson is a 77 y.o. female here with c/o        Wt Readings from Last 3 Encounters:  01/17/23 201 lb (91.2 kg)  01/02/23 200 lb (90.7 kg)  12/24/22 196 lb (88.9 kg)   BP Readings from Last 3 Encounters:  01/17/23 118/64  11/06/22 (!) 142/80  08/30/22 132/80         Past Medical History:  Diagnosis Date   Cerebrovascular disease 02/05/2019   Concussion '06, '11   Diabetes mellitus    diet management   Family history of colon cancer 02/28/2016   Fibromyalgia 10/08/2013   Hyperlipidemia    diet managed   Varicella    Past Surgical History:  Procedure Laterality Date   APPENDECTOMY  1979   CESAREAN SECTION  1978   CHOLECYSTECTOMY  1979   laparotomy    reports that she has never smoked. She has never used smokeless tobacco. She reports that she does not drink alcohol and does not use drugs. family history includes COPD in her mother; Cancer (age of onset: 60) in her mother; Diabetes in her maternal grandfather and mother; Heart disease in her father and mother; Rheumatic fever in her father. Allergies  Allergen Reactions   Codeine Other (See Comments)   Methylprednisolone Itching   Current Outpatient Medications on File Prior to Visit  Medication Sig Dispense Refill   Cholecalciferol ([employer] D3) 50 MCG (2000 UT) CAPS Take by mouth.      EQ ALLERGY RELIEF, CETIRIZINE, 10 MG tablet Take 1 tablet by mouth once daily 90 tablet 0   fluocinonide cream (LIDEX) 0.05 % Apply 1 application  topically 2 (two) times daily.     gatifloxacin (ZYMAXID) 0.5 % SOLN Place 1 drop into the right eye 4 (four) times daily.     ketorolac (ACULAR) 0.5 % ophthalmic solution Place 1 drop into the right eye 4 (four) times daily.     lidocaine (LIDODERM) 5 % Place 1 patch onto the skin daily. Remove & Discard patch within 12 hours or as  directed by MD 30 patch 0   meclizine (ANTIVERT) 25 MG tablet Take 1 tablet (25 mg total) by mouth 2 (two) times daily as needed for dizziness. 30 tablet 0   Multiple Vitamins-Minerals (MULTIVITAMIN WITH MINERALS) tablet Take 1 tablet by mouth daily.     Multiple Vitamins-Minerals (PRESERVISION AREDS 2 PO) Take by mouth.     prednisoLONE acetate (PRED FORTE) 1 % ophthalmic suspension Place 1 drop into the right eye 4 (four) times daily.     rosuvastatin (CRESTOR) 40 MG tablet Take 1 tablet by mouth once daily 90 tablet 3   triamcinolone (NASACORT) 55 MCG/ACT AERO nasal inhaler Place 2 sprays into the nose daily. 1 each 2   albuterol (VENTOLIN HFA) 108 (90 Base) MCG/ACT inhaler Inhale 2 puffs into the lungs every 6 (six) hours as needed for wheezing or shortness of breath. (Patient not taking: Reported on 01/17/2023) 8 g 0   celecoxib (CELEBREX) 200 MG capsule Take 1 capsule (200 mg total) by mouth 2 (two) times daily as needed. (Patient not taking: Reported on 11/06/2022) 180 capsule 1   Cyanocobalamin (CVS VITAMIN B-12 PO) Take 2,500 tablets by mouth every other day. (Patient not taking: Reported on  11/06/2022)     No current facility-administered medications on file prior to visit.        ROS:  All others reviewed and negative.  Objective        PE:  BP 118/64 (BP Location: Right Arm, Patient Position: Sitting, Cuff Size: Normal)   Pulse 85   Temp 98.9 F (37.2 C) (Oral)   Ht 5\' 2"  (1.575 m)   Wt 201 lb (91.2 kg)   SpO2 98%   BMI 36.76 kg/m                 Constitutional: Pt appears in NAD               HENT: Head: NCAT.                Right Ear: External ear normal.                 Left Ear: External ear normal.                Eyes: . Pupils are equal, round, and reactive to light. Conjunctivae and EOM are normal               Nose: without d/c or deformity               Neck: Neck supple. Gross normal ROM               Cardiovascular: Normal rate and regular rhythm.                  Pulmonary/Chest: Effort normal and breath sounds without rales or wheezing.                Abd:  Soft, NT, ND, + BS, no organomegaly               Neurological: Pt is alert. At baseline orientation, motor grossly intact               Skin: Skin is warm. No rashes, no other new lesions, LE edema - none               Psychiatric: Pt behavior is normal without agitation   Micro: none  Cardiac tracings I have personally interpreted today:  none  Pertinent Radiological findings (summarize): none   Lab Results  Component Value Date   WBC 5.9 07/04/2022   HGB 13.1 07/04/2022   HCT 39.6 07/04/2022   PLT 289.0 07/04/2022   GLUCOSE 99 01/11/2023   CHOL 166 01/11/2023   TRIG 309.0 (H) 01/11/2023   HDL 49.10 01/11/2023   LDLDIRECT 75.0 01/11/2023   LDLCALC 60 07/04/2022   ALT 24 01/11/2023   AST 20 01/11/2023   NA 139 01/11/2023   K 4.2 01/11/2023   CL 102 01/11/2023   CREATININE 0.75 01/11/2023   BUN 20 01/11/2023   CO2 31 01/11/2023   TSH 2.22 07/04/2022   INR 0.92 05/24/2011   HGBA1C 6.3 01/11/2023   MICROALBUR 2.2 (H) 07/04/2022   Assessment/Plan:  Debra Atkinson is a 77 y.o. White or Caucasian [1] female with  has a past medical history of Cerebrovascular disease (02/05/2019), Concussion ('06, '11), Diabetes mellitus, Family history of colon cancer (02/28/2016), Fibromyalgia (10/08/2013), Hyperlipidemia, and Varicella.  No problem-specific Assessment & Plan notes found for this encounter.  Followup: No follow-ups on file.  Oliver Barre, MD 01/17/2023 11:01 AM [facility]

## 2023-01-19 ENCOUNTER — Encounter: Payer: Self-pay | Admitting: Internal Medicine

## 2023-01-19 NOTE — Assessment & Plan Note (Signed)
Last vitamin D Lab Results  Component Value Date   VD25OH 40.94 01/11/2023   Stable, cont oral replacement

## 2023-01-19 NOTE — Assessment & Plan Note (Signed)
Lab Results  Component Value Date   LDLCALC 60 07/04/2022   Stable, pt to continue current statin crestor 40 every day,   Lab Results  Component Value Date   CHOL 166 01/11/2023   HDL 49.10 01/11/2023   LDLCALC 60 07/04/2022   LDLDIRECT 75.0 01/11/2023   TRIG 309.0 (H) 01/11/2023   CHOLHDL 3 01/11/2023  'declines fenofibrate for elevated TG

## 2023-01-19 NOTE — Assessment & Plan Note (Signed)
Lab Results  Component Value Date   HGBA1C 6.3 01/11/2023   Stable, pt to continue current medical treatment  - diet, wt control

## 2023-01-19 NOTE — Assessment & Plan Note (Signed)
Lab Results  Component Value Date   [id number] 07/04/2022   Stable, cont oral replacement - b12 1000 mcg qd

## 2023-01-21 DIAGNOSIS — H01009 Unspecified blepharitis unspecified eye, unspecified eyelid: Secondary | ICD-10-CM | POA: Diagnosis not present

## 2023-01-22 DIAGNOSIS — H8111 Benign paroxysmal vertigo, right ear: Secondary | ICD-10-CM | POA: Diagnosis not present

## 2023-01-22 DIAGNOSIS — R262 Difficulty in walking, not elsewhere classified: Secondary | ICD-10-CM | POA: Diagnosis not present

## 2023-01-22 DIAGNOSIS — M25561 Pain in right knee: Secondary | ICD-10-CM | POA: Diagnosis not present

## 2023-01-22 DIAGNOSIS — M62561 Muscle wasting and atrophy, not elsewhere classified, right lower leg: Secondary | ICD-10-CM | POA: Diagnosis not present

## 2023-01-22 DIAGNOSIS — M799 Soft tissue disorder, unspecified: Secondary | ICD-10-CM | POA: Diagnosis not present

## 2023-01-22 DIAGNOSIS — M79604 Pain in right leg: Secondary | ICD-10-CM | POA: Diagnosis not present

## 2023-01-22 DIAGNOSIS — M62551 Muscle wasting and atrophy, not elsewhere classified, right thigh: Secondary | ICD-10-CM | POA: Diagnosis not present

## 2023-01-25 DIAGNOSIS — H8111 Benign paroxysmal vertigo, right ear: Secondary | ICD-10-CM | POA: Diagnosis not present

## 2023-01-25 DIAGNOSIS — M79604 Pain in right leg: Secondary | ICD-10-CM | POA: Diagnosis not present

## 2023-01-25 DIAGNOSIS — M799 Soft tissue disorder, unspecified: Secondary | ICD-10-CM | POA: Diagnosis not present

## 2023-01-25 DIAGNOSIS — R262 Difficulty in walking, not elsewhere classified: Secondary | ICD-10-CM | POA: Diagnosis not present

## 2023-01-25 DIAGNOSIS — M62561 Muscle wasting and atrophy, not elsewhere classified, right lower leg: Secondary | ICD-10-CM | POA: Diagnosis not present

## 2023-01-25 DIAGNOSIS — M25561 Pain in right knee: Secondary | ICD-10-CM | POA: Diagnosis not present

## 2023-01-25 DIAGNOSIS — M62551 Muscle wasting and atrophy, not elsewhere classified, right thigh: Secondary | ICD-10-CM | POA: Diagnosis not present

## 2023-01-31 DIAGNOSIS — H8111 Benign paroxysmal vertigo, right ear: Secondary | ICD-10-CM | POA: Diagnosis not present

## 2023-01-31 DIAGNOSIS — M62551 Muscle wasting and atrophy, not elsewhere classified, right thigh: Secondary | ICD-10-CM | POA: Diagnosis not present

## 2023-01-31 DIAGNOSIS — M62561 Muscle wasting and atrophy, not elsewhere classified, right lower leg: Secondary | ICD-10-CM | POA: Diagnosis not present

## 2023-01-31 DIAGNOSIS — M79604 Pain in right leg: Secondary | ICD-10-CM | POA: Diagnosis not present

## 2023-01-31 DIAGNOSIS — R262 Difficulty in walking, not elsewhere classified: Secondary | ICD-10-CM | POA: Diagnosis not present

## 2023-01-31 DIAGNOSIS — M799 Soft tissue disorder, unspecified: Secondary | ICD-10-CM | POA: Diagnosis not present

## 2023-01-31 DIAGNOSIS — M25561 Pain in right knee: Secondary | ICD-10-CM | POA: Diagnosis not present

## 2023-02-05 DIAGNOSIS — M62551 Muscle wasting and atrophy, not elsewhere classified, right thigh: Secondary | ICD-10-CM | POA: Diagnosis not present

## 2023-02-05 DIAGNOSIS — M79604 Pain in right leg: Secondary | ICD-10-CM | POA: Diagnosis not present

## 2023-02-05 DIAGNOSIS — R262 Difficulty in walking, not elsewhere classified: Secondary | ICD-10-CM | POA: Diagnosis not present

## 2023-02-05 DIAGNOSIS — M799 Soft tissue disorder, unspecified: Secondary | ICD-10-CM | POA: Diagnosis not present

## 2023-02-05 DIAGNOSIS — H8111 Benign paroxysmal vertigo, right ear: Secondary | ICD-10-CM | POA: Diagnosis not present

## 2023-02-05 DIAGNOSIS — M62561 Muscle wasting and atrophy, not elsewhere classified, right lower leg: Secondary | ICD-10-CM | POA: Diagnosis not present

## 2023-02-05 DIAGNOSIS — M25561 Pain in right knee: Secondary | ICD-10-CM | POA: Diagnosis not present

## 2023-02-08 DIAGNOSIS — M79604 Pain in right leg: Secondary | ICD-10-CM | POA: Diagnosis not present

## 2023-02-08 DIAGNOSIS — R262 Difficulty in walking, not elsewhere classified: Secondary | ICD-10-CM | POA: Diagnosis not present

## 2023-02-08 DIAGNOSIS — M25561 Pain in right knee: Secondary | ICD-10-CM | POA: Diagnosis not present

## 2023-02-08 DIAGNOSIS — M62561 Muscle wasting and atrophy, not elsewhere classified, right lower leg: Secondary | ICD-10-CM | POA: Diagnosis not present

## 2023-02-08 DIAGNOSIS — M799 Soft tissue disorder, unspecified: Secondary | ICD-10-CM | POA: Diagnosis not present

## 2023-02-08 DIAGNOSIS — M62551 Muscle wasting and atrophy, not elsewhere classified, right thigh: Secondary | ICD-10-CM | POA: Diagnosis not present

## 2023-02-08 DIAGNOSIS — H8111 Benign paroxysmal vertigo, right ear: Secondary | ICD-10-CM | POA: Diagnosis not present

## 2023-02-11 DIAGNOSIS — M25561 Pain in right knee: Secondary | ICD-10-CM | POA: Diagnosis not present

## 2023-02-11 DIAGNOSIS — M79604 Pain in right leg: Secondary | ICD-10-CM | POA: Diagnosis not present

## 2023-02-11 DIAGNOSIS — M62551 Muscle wasting and atrophy, not elsewhere classified, right thigh: Secondary | ICD-10-CM | POA: Diagnosis not present

## 2023-02-11 DIAGNOSIS — M799 Soft tissue disorder, unspecified: Secondary | ICD-10-CM | POA: Diagnosis not present

## 2023-02-11 DIAGNOSIS — R262 Difficulty in walking, not elsewhere classified: Secondary | ICD-10-CM | POA: Diagnosis not present

## 2023-02-11 DIAGNOSIS — L859 Epidermal thickening, unspecified: Secondary | ICD-10-CM | POA: Diagnosis not present

## 2023-02-11 DIAGNOSIS — M62561 Muscle wasting and atrophy, not elsewhere classified, right lower leg: Secondary | ICD-10-CM | POA: Diagnosis not present

## 2023-02-11 DIAGNOSIS — H8111 Benign paroxysmal vertigo, right ear: Secondary | ICD-10-CM | POA: Diagnosis not present

## 2023-02-14 DIAGNOSIS — M799 Soft tissue disorder, unspecified: Secondary | ICD-10-CM | POA: Diagnosis not present

## 2023-02-14 DIAGNOSIS — M79604 Pain in right leg: Secondary | ICD-10-CM | POA: Diagnosis not present

## 2023-02-14 DIAGNOSIS — H8111 Benign paroxysmal vertigo, right ear: Secondary | ICD-10-CM | POA: Diagnosis not present

## 2023-02-14 DIAGNOSIS — M62551 Muscle wasting and atrophy, not elsewhere classified, right thigh: Secondary | ICD-10-CM | POA: Diagnosis not present

## 2023-02-14 DIAGNOSIS — R262 Difficulty in walking, not elsewhere classified: Secondary | ICD-10-CM | POA: Diagnosis not present

## 2023-02-14 DIAGNOSIS — M25561 Pain in right knee: Secondary | ICD-10-CM | POA: Diagnosis not present

## 2023-02-14 DIAGNOSIS — M62561 Muscle wasting and atrophy, not elsewhere classified, right lower leg: Secondary | ICD-10-CM | POA: Diagnosis not present

## 2023-02-19 DIAGNOSIS — M25561 Pain in right knee: Secondary | ICD-10-CM | POA: Diagnosis not present

## 2023-02-19 DIAGNOSIS — H8111 Benign paroxysmal vertigo, right ear: Secondary | ICD-10-CM | POA: Diagnosis not present

## 2023-02-19 DIAGNOSIS — R262 Difficulty in walking, not elsewhere classified: Secondary | ICD-10-CM | POA: Diagnosis not present

## 2023-02-19 DIAGNOSIS — M62551 Muscle wasting and atrophy, not elsewhere classified, right thigh: Secondary | ICD-10-CM | POA: Diagnosis not present

## 2023-02-19 DIAGNOSIS — M62561 Muscle wasting and atrophy, not elsewhere classified, right lower leg: Secondary | ICD-10-CM | POA: Diagnosis not present

## 2023-02-19 DIAGNOSIS — M79604 Pain in right leg: Secondary | ICD-10-CM | POA: Diagnosis not present

## 2023-02-19 DIAGNOSIS — M799 Soft tissue disorder, unspecified: Secondary | ICD-10-CM | POA: Diagnosis not present

## 2023-02-21 DIAGNOSIS — M79604 Pain in right leg: Secondary | ICD-10-CM | POA: Diagnosis not present

## 2023-02-21 DIAGNOSIS — M62551 Muscle wasting and atrophy, not elsewhere classified, right thigh: Secondary | ICD-10-CM | POA: Diagnosis not present

## 2023-02-21 DIAGNOSIS — H8111 Benign paroxysmal vertigo, right ear: Secondary | ICD-10-CM | POA: Diagnosis not present

## 2023-02-21 DIAGNOSIS — M62561 Muscle wasting and atrophy, not elsewhere classified, right lower leg: Secondary | ICD-10-CM | POA: Diagnosis not present

## 2023-02-21 DIAGNOSIS — M25561 Pain in right knee: Secondary | ICD-10-CM | POA: Diagnosis not present

## 2023-02-21 DIAGNOSIS — R262 Difficulty in walking, not elsewhere classified: Secondary | ICD-10-CM | POA: Diagnosis not present

## 2023-02-21 DIAGNOSIS — M799 Soft tissue disorder, unspecified: Secondary | ICD-10-CM | POA: Diagnosis not present

## 2023-02-25 DIAGNOSIS — M62551 Muscle wasting and atrophy, not elsewhere classified, right thigh: Secondary | ICD-10-CM | POA: Diagnosis not present

## 2023-02-25 DIAGNOSIS — H8111 Benign paroxysmal vertigo, right ear: Secondary | ICD-10-CM | POA: Diagnosis not present

## 2023-02-25 DIAGNOSIS — M79604 Pain in right leg: Secondary | ICD-10-CM | POA: Diagnosis not present

## 2023-02-25 DIAGNOSIS — M25561 Pain in right knee: Secondary | ICD-10-CM | POA: Diagnosis not present

## 2023-02-25 DIAGNOSIS — R262 Difficulty in walking, not elsewhere classified: Secondary | ICD-10-CM | POA: Diagnosis not present

## 2023-02-25 DIAGNOSIS — M62561 Muscle wasting and atrophy, not elsewhere classified, right lower leg: Secondary | ICD-10-CM | POA: Diagnosis not present

## 2023-02-25 DIAGNOSIS — M799 Soft tissue disorder, unspecified: Secondary | ICD-10-CM | POA: Diagnosis not present

## 2023-03-01 DIAGNOSIS — M79604 Pain in right leg: Secondary | ICD-10-CM | POA: Diagnosis not present

## 2023-03-01 DIAGNOSIS — M25561 Pain in right knee: Secondary | ICD-10-CM | POA: Diagnosis not present

## 2023-03-01 DIAGNOSIS — H8111 Benign paroxysmal vertigo, right ear: Secondary | ICD-10-CM | POA: Diagnosis not present

## 2023-03-01 DIAGNOSIS — M799 Soft tissue disorder, unspecified: Secondary | ICD-10-CM | POA: Diagnosis not present

## 2023-03-01 DIAGNOSIS — M62551 Muscle wasting and atrophy, not elsewhere classified, right thigh: Secondary | ICD-10-CM | POA: Diagnosis not present

## 2023-03-01 DIAGNOSIS — R262 Difficulty in walking, not elsewhere classified: Secondary | ICD-10-CM | POA: Diagnosis not present

## 2023-03-01 DIAGNOSIS — M62561 Muscle wasting and atrophy, not elsewhere classified, right lower leg: Secondary | ICD-10-CM | POA: Diagnosis not present

## 2023-03-05 DIAGNOSIS — H8111 Benign paroxysmal vertigo, right ear: Secondary | ICD-10-CM | POA: Diagnosis not present

## 2023-03-05 DIAGNOSIS — M62561 Muscle wasting and atrophy, not elsewhere classified, right lower leg: Secondary | ICD-10-CM | POA: Diagnosis not present

## 2023-03-05 DIAGNOSIS — M62551 Muscle wasting and atrophy, not elsewhere classified, right thigh: Secondary | ICD-10-CM | POA: Diagnosis not present

## 2023-03-05 DIAGNOSIS — M799 Soft tissue disorder, unspecified: Secondary | ICD-10-CM | POA: Diagnosis not present

## 2023-03-05 DIAGNOSIS — M25561 Pain in right knee: Secondary | ICD-10-CM | POA: Diagnosis not present

## 2023-03-05 DIAGNOSIS — M79604 Pain in right leg: Secondary | ICD-10-CM | POA: Diagnosis not present

## 2023-03-05 DIAGNOSIS — R262 Difficulty in walking, not elsewhere classified: Secondary | ICD-10-CM | POA: Diagnosis not present

## 2023-04-25 ENCOUNTER — Ambulatory Visit (INDEPENDENT_AMBULATORY_CARE_PROVIDER_SITE_OTHER): Payer: Medicare HMO | Admitting: Internal Medicine

## 2023-04-25 ENCOUNTER — Encounter: Payer: Self-pay | Admitting: Internal Medicine

## 2023-04-25 VITALS — BP 134/78 | HR 82 | Temp 98.6°F | Ht 62.0 in | Wt 202.0 lb

## 2023-04-25 DIAGNOSIS — R7303 Prediabetes: Secondary | ICD-10-CM | POA: Diagnosis not present

## 2023-04-25 DIAGNOSIS — J069 Acute upper respiratory infection, unspecified: Secondary | ICD-10-CM | POA: Diagnosis not present

## 2023-04-25 DIAGNOSIS — J309 Allergic rhinitis, unspecified: Secondary | ICD-10-CM

## 2023-04-25 DIAGNOSIS — E559 Vitamin D deficiency, unspecified: Secondary | ICD-10-CM | POA: Diagnosis not present

## 2023-04-25 DIAGNOSIS — R42 Dizziness and giddiness: Secondary | ICD-10-CM | POA: Diagnosis not present

## 2023-04-25 DIAGNOSIS — R051 Acute cough: Secondary | ICD-10-CM | POA: Diagnosis not present

## 2023-04-25 DIAGNOSIS — R252 Cramp and spasm: Secondary | ICD-10-CM | POA: Diagnosis not present

## 2023-04-25 DIAGNOSIS — H8110 Benign paroxysmal vertigo, unspecified ear: Secondary | ICD-10-CM

## 2023-04-25 LAB — POCT INFLUENZA A/B
Influenza A, POC: NEGATIVE
Influenza B, POC: NEGATIVE

## 2023-04-25 LAB — POC COVID19 BINAXNOW: SARS Coronavirus 2 Ag: NEGATIVE

## 2023-04-25 MED ORDER — MECLIZINE HCL 25 MG PO TABS: 25.0000 mg | ORAL_TABLET | Freq: Two times a day (BID) | ORAL | 2 refills | Status: DC | PRN

## 2023-04-25 MED ORDER — CYCLOBENZAPRINE HCL 5 MG PO TABS: 5.0000 mg | ORAL_TABLET | Freq: Three times a day (TID) | ORAL | 1 refills | Status: DC | PRN

## 2023-04-25 MED ORDER — AZITHROMYCIN 250 MG PO TABS: ORAL_TABLET | ORAL | 1 refills | Status: AC

## 2023-04-25 MED ORDER — TRIAMCINOLONE ACETONIDE 55 MCG/ACT NA AERO: 2.0000 | INHALATION_SPRAY | Freq: Every day | NASAL | 2 refills | Status: DC

## 2023-04-25 NOTE — Patient Instructions (Addendum)
Please take all new medication as prescribed - the antibiotic, nasacort and flexeril as needed for muscle cramps  Please continue all other medications as before, and refills have been done if requested- the meclizine  Please have the pharmacy call with any other refills you may need.  Please continue your efforts at being more active, low cholesterol diet, and weight control.  Please keep your appointments with your specialists as you may have planned  Please make an Appointment to return in Jan 20, or sooner if needed

## 2023-04-25 NOTE — Progress Notes (Signed)
Patient ID: Debra Atkinson, female   DOB: 1946-01-11, 77 y.o.   MRN: [id number]        Chief Complaint: follow up uri, allergies, vertigo, right medial leg cramps, and chronic lbp       HPI:  Debra Atkinson is a 77 y.o. female  Here with 2-3 days acute onset fever, facial pain, pressure, headache, general weakness and malaise, and greenish d/c, with mild ST and cough, but pt denies chest pain, wheezing, increased sob or doe, orthopnea, PND, increased LE swelling, palpitations, dizziness or syncope.  Does have several wks ongoing nasal allergy symptoms with clearish congestion, itch and sneezing, without fever, pain, ST, cough, swelling or wheezing.  Does also have recurring vertigo even before the most recent symptoms. Pt continues to have recurring LBP without change in severity, bowel or bladder change, fever, wt loss,  worsening LE pain/numbness/weakness, gait change or falls, as well as medial right leg cramps as well off and on for several wks.        Wt Readings from Last 3 Encounters:  04/25/23 202 lb (91.6 kg)  01/17/23 201 lb (91.2 kg)  01/02/23 200 lb (90.7 kg)   BP Readings from Last 3 Encounters:  04/25/23 134/78  01/17/23 118/64  11/06/22 (!) 142/80         Past Medical History:  Diagnosis Date   Cerebrovascular disease 02/05/2019   Concussion '06, '11   Diabetes mellitus    diet management   Family history of colon cancer 02/28/2016   Fibromyalgia 10/08/2013   Hyperlipidemia    diet managed   Varicella    Past Surgical History:  Procedure Laterality Date   APPENDECTOMY  1979   CESAREAN SECTION  1978   CHOLECYSTECTOMY  1979   laparotomy    reports that she has never smoked. She has never used smokeless tobacco. She reports that she does not drink alcohol and does not use drugs. family history includes COPD in her mother; Cancer (age of onset: 77) in her mother; Diabetes in her maternal grandfather and mother; Heart disease in her father and mother; Rheumatic fever in  her father. Allergies  Allergen Reactions   Codeine Other (See Comments)   Methylprednisolone Itching   Current Outpatient Medications on File Prior to Visit  Medication Sig Dispense Refill   Cholecalciferol (CVS D3) 50 MCG (2000 UT) CAPS Take by mouth.      Multiple Vitamins-Minerals (MULTIVITAMIN WITH MINERALS) tablet Take 1 tablet by mouth daily.     rosuvastatin (CRESTOR) 40 MG tablet Take 1 tablet by mouth once daily 90 tablet 3   albuterol (VENTOLIN HFA) 108 (90 Base) MCG/ACT inhaler Inhale 2 puffs into the lungs every 6 (six) hours as needed for wheezing or shortness of breath. (Patient not taking: Reported on 01/17/2023) 8 g 0   celecoxib (CELEBREX) 200 MG capsule Take 1 capsule (200 mg total) by mouth 2 (two) times daily as needed. (Patient not taking: Reported on 11/06/2022) 180 capsule 1   Cyanocobalamin (CVS VITAMIN B-12 PO) Take 2,500 tablets by mouth every other day. (Patient not taking: Reported on 11/06/2022)     EQ ALLERGY RELIEF, CETIRIZINE, 10 MG tablet Take 1 tablet by mouth once daily (Patient not taking: Reported on 04/25/2023) 90 tablet 0   fluocinonide cream (LIDEX) 0.05 % Apply 1 application  topically 2 (two) times daily. (Patient not taking: Reported on 04/25/2023)     gatifloxacin (ZYMAXID) 0.5 % SOLN Place 1 drop into the right eye 4 (  four) times daily. (Patient not taking: Reported on 04/25/2023)     ketorolac (ACULAR) 0.5 % ophthalmic solution Place 1 drop into the right eye 4 (four) times daily. (Patient not taking: Reported on 04/25/2023)     lidocaine (LIDODERM) 5 % Place 1 patch onto the skin daily. Remove & Discard patch within 12 hours or as directed by MD (Patient not taking: Reported on 04/25/2023) 30 patch 0   Multiple Vitamins-Minerals (PRESERVISION AREDS 2 PO) Take by mouth. (Patient not taking: Reported on 04/25/2023)     prednisoLONE acetate (PRED FORTE) 1 % ophthalmic suspension Place 1 drop into the right eye 4 (four) times daily. (Patient not taking:  Reported on 04/25/2023)     No current facility-administered medications on file prior to visit.        ROS:  All others reviewed and negative.  Objective        PE:  BP 134/78 (BP Location: Left Arm, Patient Position: Sitting, Cuff Size: Normal)   Pulse 82   Temp 98.6 F (37 C) (Oral)   Ht 5\' 2"  (1.575 m)   Wt 202 lb (91.6 kg)   SpO2 98%   BMI 36.95 kg/m                 Constitutional: Pt appears mild ill               HENT: Head: NCAT.                Right Ear: External ear normal.                 Left Ear: External ear normal. Bilat tm's with mild erythema.  Max sinus areas mild tender.  Pharynx with mild erythema, no exudate               Eyes: . Pupils are equal, round, and reactive to light. Conjunctivae and EOM are normal               Nose: without d/c or deformity               Neck: Neck supple. Gross normal ROM               Cardiovascular: Normal rate and regular rhythm.                 Pulmonary/Chest: Effort normal and breath sounds without rales or wheezing.                Abd:  Soft, NT, ND, + BS, no organomegaly               Neurological: Pt is alert. At baseline orientation, motor grossly intact               Skin: Skin is warm. No rashes, no other new lesions, LE edema - none               Psychiatric: Pt behavior is normal without agitation   Micro: none  Cardiac tracings I have personally interpreted today:  none  Pertinent Radiological findings (summarize): none   Lab Results  Component Value Date   WBC 5.9 07/04/2022   HGB 13.1 07/04/2022   HCT 39.6 07/04/2022   PLT 289.0 07/04/2022   GLUCOSE 99 01/11/2023   CHOL 166 01/11/2023   TRIG 309.0 (H) 01/11/2023   HDL 49.10 01/11/2023   LDLDIRECT 75.0 01/11/2023   LDLCALC 60 07/04/2022   ALT 24 01/11/2023   AST 20  01/11/2023   NA 139 01/11/2023   K 4.2 01/11/2023   CL 102 01/11/2023   CREATININE 0.75 01/11/2023   BUN 20 01/11/2023   CO2 31 01/11/2023   TSH 2.22 07/04/2022   INR 0.92  05/24/2011   HGBA1C 6.3 01/11/2023   MICROALBUR 2.2 (H) 07/04/2022   POCT - COVID - neg, Flu - neg  Assessment/Plan:  Debra Atkinson is a 77 y.o. White or Caucasian [1] female with  has a past medical history of Cerebrovascular disease (02/05/2019), Concussion ('06, '11), Diabetes mellitus, Family history of colon cancer (02/28/2016), Fibromyalgia (10/08/2013), Hyperlipidemia, and Varicella.  URI (upper respiratory infection) Mild to mod, for antibx course zpack,  to f/u any worsening symptoms or concerns  Allergic rhinitis Mild to mod, for restart nasacort asd,  to f/u any worsening symptoms or concerns  Vertigo Mild intermittent, for meclizine restart prn  Vitamin D deficiency Last vitamin D Lab Results  Component Value Date   VD25OH 40.94 01/11/2023   Stable, cont oral replacement   Pre-diabetes Lab Results  Component Value Date   HGBA1C 6.3 01/11/2023   Stable, pt to continue current medical treatment  -  diet, wt control   Right leg pain Ok for right medial leg cramping - for flexeril prn  Followup: Return in about 3 months (around 07/22/2023).  Oliver Barre, MD 04/28/2023 6:47 PM [facility] [place] [facility] - [facility]

## 2023-04-26 ENCOUNTER — Telehealth: Payer: Self-pay | Admitting: Internal Medicine

## 2023-04-26 NOTE — Telephone Encounter (Signed)
Pt called about about medication called  triamcinolone (NASACORT) 55 MCG/ACT AERO nasal inhale and mentioned that she is scared to take it after researching that it wasn't good to use if " you had cataract surgery and I have had that." Is there another medication you can prescribe her instead of that?  Pt also wanted to know if she needs to continue to take EQ ALLERGY RELIEF, CETIRIZINE, 10 MG tablet because she is not taking it right now but was just wondering because she have them.  Please advise  Thanks.

## 2023-04-26 NOTE — Telephone Encounter (Signed)
Ok to take the zyrtec instead of the nasacort if she likes  Also by the way, the risk of the nasacort use and cataracts is only theoretical and primarily for legal reasons so they can avoid lawsuits;  I am not aware of any actual cases

## 2023-04-26 NOTE — Telephone Encounter (Signed)
Called and let Pt know

## 2023-04-28 ENCOUNTER — Encounter: Payer: Self-pay | Admitting: Internal Medicine

## 2023-04-28 DIAGNOSIS — R252 Cramp and spasm: Secondary | ICD-10-CM | POA: Insufficient documentation

## 2023-04-28 NOTE — Assessment & Plan Note (Signed)
Lab Results  Component Value Date   HGBA1C 6.3 01/11/2023   Stable, pt to continue current medical treatment  - diet, wt control

## 2023-04-28 NOTE — Assessment & Plan Note (Signed)
Mild to mod, for restart nasacort asd,  to f/u any worsening symptoms or concerns

## 2023-04-28 NOTE — Assessment & Plan Note (Signed)
Mild to mod, for antibx course zpack,  to f/u any worsening symptoms or concerns 

## 2023-04-28 NOTE — Assessment & Plan Note (Signed)
Ok for right medial leg cramping - for flexeril prn

## 2023-04-28 NOTE — Assessment & Plan Note (Signed)
Mild intermittent, for meclizine restart prn

## 2023-04-28 NOTE — Assessment & Plan Note (Signed)
Last vitamin D Lab Results  Component Value Date   VD25OH 40.94 01/11/2023   Stable, cont oral replacement

## 2023-05-01 DIAGNOSIS — H26493 Other secondary cataract, bilateral: Secondary | ICD-10-CM | POA: Diagnosis not present

## 2023-05-01 DIAGNOSIS — H353122 Nonexudative age-related macular degeneration, left eye, intermediate dry stage: Secondary | ICD-10-CM | POA: Diagnosis not present

## 2023-05-01 DIAGNOSIS — Z961 Presence of intraocular lens: Secondary | ICD-10-CM | POA: Diagnosis not present

## 2023-05-01 DIAGNOSIS — H353111 Nonexudative age-related macular degeneration, right eye, early dry stage: Secondary | ICD-10-CM | POA: Diagnosis not present

## 2023-06-12 DIAGNOSIS — Z1231 Encounter for screening mammogram for malignant neoplasm of breast: Secondary | ICD-10-CM | POA: Diagnosis not present

## 2023-06-12 LAB — HM MAMMOGRAPHY

## 2023-06-14 ENCOUNTER — Encounter: Payer: Self-pay | Admitting: Family Medicine

## 2023-06-14 ENCOUNTER — Ambulatory Visit (INDEPENDENT_AMBULATORY_CARE_PROVIDER_SITE_OTHER): Payer: Medicare HMO | Admitting: Family Medicine

## 2023-06-14 VITALS — BP 124/82 | HR 70 | Temp 98.0°F | Ht 62.0 in | Wt 204.2 lb

## 2023-06-14 DIAGNOSIS — R051 Acute cough: Secondary | ICD-10-CM | POA: Diagnosis not present

## 2023-06-14 DIAGNOSIS — J3089 Other allergic rhinitis: Secondary | ICD-10-CM

## 2023-06-14 DIAGNOSIS — J029 Acute pharyngitis, unspecified: Secondary | ICD-10-CM | POA: Diagnosis not present

## 2023-06-14 NOTE — Progress Notes (Unsigned)
   Acute Office Visit  Subjective:     Patient ID: Debra Atkinson, female    DOB: Jan 31, 1946, 77 y.o.   MRN: [id number]  No chief complaint on file.   HPI Patient is in today for evaluation of ***, for the last ***. Has tried Denies known sick contacts, Denies abdominal pain, nausea, vomiting, diarrhea, rash, fever, chills, other symptoms.  Medical hx as outlined below.  ROS Per HPI      Objective:    There were no vitals taken for this visit.   Physical Exam Vitals and nursing note reviewed.  HENT:     Head: Normocephalic and atraumatic.     Nose: No congestion.     Mouth/Throat:     Mouth: Mucous membranes are moist.     Pharynx: Oropharynx is clear. No oropharyngeal exudate or posterior oropharyngeal erythema.  Eyes:     Extraocular Movements: Extraocular movements intact.  Cardiovascular:     Rate and Rhythm: Normal rate and regular rhythm.  Pulmonary:     Effort: Pulmonary effort is normal. No respiratory distress.  Musculoskeletal:     Cervical back: Normal range of motion and neck supple.  Lymphadenopathy:     Cervical: No cervical adenopathy.  Skin:    General: Skin is warm and dry.  Neurological:     Mental Status: She is alert.   No results found for any visits on 06/14/23.      Assessment & Plan:  ***  No orders of the defined types were placed in this encounter.   No follow-ups on file.  Moshe Cipro, FNP

## 2023-06-14 NOTE — Patient Instructions (Addendum)
I would recommend taking the cetirizine at home to help dry up your nose.  COVID test today was negative.  Follow-up with me for new or worsening symptoms.

## 2023-06-16 LAB — POC COVID19 BINAXNOW: SARS Coronavirus 2 Ag: NEGATIVE

## 2023-06-28 DIAGNOSIS — N6311 Unspecified lump in the right breast, upper outer quadrant: Secondary | ICD-10-CM | POA: Diagnosis not present

## 2023-06-28 DIAGNOSIS — R922 Inconclusive mammogram: Secondary | ICD-10-CM | POA: Diagnosis not present

## 2023-07-01 ENCOUNTER — Encounter: Payer: Self-pay | Admitting: Internal Medicine

## 2023-07-03 DIAGNOSIS — C801 Malignant (primary) neoplasm, unspecified: Secondary | ICD-10-CM

## 2023-07-03 HISTORY — DX: Malignant (primary) neoplasm, unspecified: C80.1

## 2023-07-15 DIAGNOSIS — Z170 Estrogen receptor positive status [ER+]: Secondary | ICD-10-CM | POA: Diagnosis not present

## 2023-07-15 DIAGNOSIS — N6311 Unspecified lump in the right breast, upper outer quadrant: Secondary | ICD-10-CM | POA: Diagnosis not present

## 2023-07-15 DIAGNOSIS — C50411 Malignant neoplasm of upper-outer quadrant of right female breast: Secondary | ICD-10-CM | POA: Diagnosis not present

## 2023-07-18 ENCOUNTER — Encounter: Payer: Self-pay | Admitting: *Deleted

## 2023-07-18 ENCOUNTER — Telehealth: Payer: Self-pay | Admitting: *Deleted

## 2023-07-18 DIAGNOSIS — Z170 Estrogen receptor positive status [ER+]: Secondary | ICD-10-CM | POA: Insufficient documentation

## 2023-07-18 NOTE — Telephone Encounter (Signed)
Spoke to patient to confirm upcoming afternoon [facility] clinic appointment on 1/22, paperwork will be sent via [employer]/e-mail.  Gave location and time, also informed patient that the surgeon's office would be calling as well to get information from them similar to the packet that they will be receiving so make sure to do both.  Reminded patient that all providers will be coming to the clinic to see them HERE and if they had any questions to not hesitate to reach back out to myself or their navigators.

## 2023-07-22 ENCOUNTER — Encounter: Payer: Self-pay | Admitting: Internal Medicine

## 2023-07-22 ENCOUNTER — Ambulatory Visit (INDEPENDENT_AMBULATORY_CARE_PROVIDER_SITE_OTHER): Payer: Medicare HMO | Admitting: Internal Medicine

## 2023-07-22 VITALS — BP 132/80 | HR 75 | Temp 98.5°F | Ht 62.0 in | Wt 203.0 lb

## 2023-07-22 DIAGNOSIS — R7303 Prediabetes: Secondary | ICD-10-CM

## 2023-07-22 DIAGNOSIS — C50411 Malignant neoplasm of upper-outer quadrant of right female breast: Secondary | ICD-10-CM

## 2023-07-22 DIAGNOSIS — Z0001 Encounter for general adult medical examination with abnormal findings: Secondary | ICD-10-CM | POA: Diagnosis not present

## 2023-07-22 DIAGNOSIS — E7800 Pure hypercholesterolemia, unspecified: Secondary | ICD-10-CM | POA: Diagnosis not present

## 2023-07-22 DIAGNOSIS — E041 Nontoxic single thyroid nodule: Secondary | ICD-10-CM | POA: Diagnosis not present

## 2023-07-22 DIAGNOSIS — E559 Vitamin D deficiency, unspecified: Secondary | ICD-10-CM | POA: Diagnosis not present

## 2023-07-22 DIAGNOSIS — F419 Anxiety disorder, unspecified: Secondary | ICD-10-CM | POA: Diagnosis not present

## 2023-07-22 DIAGNOSIS — E538 Deficiency of other specified B group vitamins: Secondary | ICD-10-CM | POA: Diagnosis not present

## 2023-07-22 DIAGNOSIS — Z170 Estrogen receptor positive status [ER+]: Secondary | ICD-10-CM | POA: Diagnosis not present

## 2023-07-22 DIAGNOSIS — R002 Palpitations: Secondary | ICD-10-CM | POA: Diagnosis not present

## 2023-07-22 LAB — CBC WITH DIFFERENTIAL/PLATELET
Basophils Absolute: 0 10*3/uL (ref 0.0–0.1)
Basophils Relative: 0.4 % (ref 0.0–3.0)
Eosinophils Absolute: 0.1 10*3/uL (ref 0.0–0.7)
Eosinophils Relative: 1.1 % (ref 0.0–5.0)
HCT: 42.7 % (ref 36.0–46.0)
Hemoglobin: 13.8 g/dL (ref 12.0–15.0)
Lymphocytes Relative: 38.2 % (ref 12.0–46.0)
Lymphs Abs: 2.5 10*3/uL (ref 0.7–4.0)
MCHC: 32.4 g/dL (ref 30.0–36.0)
MCV: 84.7 fL (ref 78.0–100.0)
Monocytes Absolute: 0.5 10*3/uL (ref 0.1–1.0)
Monocytes Relative: 8.1 % (ref 3.0–12.0)
Neutro Abs: 3.4 10*3/uL (ref 1.4–7.7)
Neutrophils Relative %: 52.2 % (ref 43.0–77.0)
Platelets: 324 10*3/uL (ref 150.0–400.0)
RBC: 5.04 Mil/uL (ref 3.87–5.11)
RDW: 14.3 % (ref 11.5–15.5)
WBC: 6.5 10*3/uL (ref 4.0–10.5)

## 2023-07-22 LAB — BASIC METABOLIC PANEL
BUN: 13 mg/dL (ref 6–23)
CO2: 30 meq/L (ref 19–32)
Calcium: 9.6 mg/dL (ref 8.4–10.5)
Chloride: 104 meq/L (ref 96–112)
Creatinine, Ser: 0.67 mg/dL (ref 0.40–1.20)
GFR: 84.31 mL/min (ref >60.00)
Glucose, Bld: 107 mg/dL — ABNORMAL HIGH (ref 70–99)
Potassium: 3.9 meq/L (ref 3.5–5.1)
Sodium: 141 meq/L (ref 135–145)

## 2023-07-22 LAB — URINALYSIS, ROUTINE W REFLEX MICROSCOPIC
Bilirubin Urine: NEGATIVE
Hgb urine dipstick: NEGATIVE
Ketones, ur: NEGATIVE
Leukocytes,Ua: NEGATIVE
Nitrite: NEGATIVE
Specific Gravity, Urine: 1.015 (ref 1.000–1.030)
Total Protein, Urine: NEGATIVE
Urine Glucose: NEGATIVE
Urobilinogen, UA: 0.2 (ref 0.0–1.0)
pH: 7 (ref 5.0–8.0)

## 2023-07-22 LAB — LIPID PANEL
Cholesterol: 162 mg/dL (ref 0–200)
HDL: 54.3 mg/dL (ref >39.00)
LDL Cholesterol: 69 mg/dL (ref 0–99)
NonHDL: 107.57
Total CHOL/HDL Ratio: 3
Triglycerides: 191 mg/dL — ABNORMAL HIGH (ref 0.0–149.0)
VLDL: 38.2 mg/dL (ref 0.0–40.0)

## 2023-07-22 LAB — HEPATIC FUNCTION PANEL
ALT: 25 U/L (ref 0–35)
AST: 20 U/L (ref 0–37)
Albumin: 4.4 g/dL (ref 3.5–5.2)
Alkaline Phosphatase: 89 U/L (ref 39–117)
Bilirubin, Direct: 0.1 mg/dL (ref 0.0–0.3)
Total Bilirubin: 0.5 mg/dL (ref 0.2–1.2)
Total Protein: 7.5 g/dL (ref 6.0–8.3)

## 2023-07-22 LAB — TSH: TSH: 1.67 u[IU]/mL (ref 0.35–5.50)

## 2023-07-22 LAB — MICROALBUMIN / CREATININE URINE RATIO
Creatinine,U: 77.8 mg/dL
Microalb Creat Ratio: 1.7 mg/g (ref 0.0–30.0)
Microalb, Ur: 1.3 mg/dL (ref 0.0–1.9)

## 2023-07-22 LAB — HEMOGLOBIN A1C: Hgb A1c MFr Bld: 6.7 % — ABNORMAL HIGH (ref 4.6–6.5)

## 2023-07-22 LAB — VITAMIN D 25 HYDROXY (VIT D DEFICIENCY, FRACTURES): VITD: 41.95 ng/mL (ref 30.00–100.00)

## 2023-07-22 LAB — VITAMIN B12: Vitamin B-12: 769 pg/mL (ref 211–911)

## 2023-07-22 NOTE — Assessment & Plan Note (Signed)
Pt requests gene testing to avoid further lab testing , has appt in f/u for wed 1/22

## 2023-07-22 NOTE — Patient Instructions (Signed)
Please continue all other medications as before, and refills have been done if requested.  Please have the pharmacy call with any other refills you may need.  Please continue your efforts at being more active, low cholesterol diet, and weight control.  You are otherwise up to date with prevention measures today.  Please keep your appointments with your specialists as you may have planned  You will be contacted regarding the referral for: thyroid ultrasound, and cardiac event monitor  Please go to the LAB at the blood drawing area for the tests to be done  You will be contacted by phone if any changes need to be made immediately.  Otherwise, you will receive a letter about your results with an explanation, but please check with MyChart first.  Please make an Appointment to return in 6 months, or sooner if needed

## 2023-07-22 NOTE — Progress Notes (Signed)
The test results show that your current treatment is OK, as the tests are stable.  Please continue the same plan.  There is no other need for change of treatment or further evaluation based on these results, at this time.  thanks 

## 2023-07-22 NOTE — Progress Notes (Unsigned)
Patient ID: Debra Atkinson, female   DOB: Aug 29, 1945, 78 y.o.   MRN: [id number]         Chief Complaint:: wellness exam and new diagnosis breast ca, anxiety, thyroid nodule, palpitations/sob       HPI:  Debra Atkinson is a 78 y.o. female here for wellness exam; for shingrix at pharmacy, declines flu shot, o/w up to date                        Also has oncology first visit soon next wk for new diagnosis right breast ca. Pt denies chest pain, wheezing, orthopnea, PND, increased LE swelling, dizziness or syncope, but has had worsening anxiety with new diagnosi with palpitations and sob   Pt denies polydipsia, polyuria, or new focal neuro s/s.   Pt denies fever, wt loss, night sweats, loss of appetite, or other constitutional symptoms   Pt asks for thyroid nodule f/u as she believes right nodule has increased in size.    Wt Readings from Last 3 Encounters:  07/22/23 203 lb (92.1 kg)  06/14/23 204 lb 3.2 oz (92.6 kg)  04/25/23 202 lb (91.6 kg)   BP Readings from Last 3 Encounters:  07/22/23 132/80  06/14/23 124/82  04/25/23 134/78   Immunization History  Administered Date(s) Administered   Fluad Quad(high Dose 65+) 05/07/2019   Influenza Split 05/04/2011, 04/28/2012   Influenza, High Dose Seasonal PF 03/28/2017, 05/08/2018   Influenza,inj,Quad PF,6+ Mos 05/18/2013, 04/09/2014, 04/15/2015, 04/26/2016   Influenza-Unspecified 04/04/2015   Pneumococcal Conjugate-13 10/08/2014   Pneumococcal Polysaccharide-23 05/04/2011   Tdap 04/28/2012, 12/07/2020   Health Maintenance Due  Topic Date Due   Zoster Vaccines- Shingrix (1 of 2) Never done      Past Medical History:  Diagnosis Date   Cerebrovascular disease 02/05/2019   Concussion '06, '11   Diabetes mellitus    diet management   Family history of colon cancer 02/28/2016   Fibromyalgia 10/08/2013   Hyperlipidemia    diet managed   Varicella    Past Surgical History:  Procedure Laterality Date   APPENDECTOMY  1979   CESAREAN  SECTION  1978   CHOLECYSTECTOMY  1979   laparotomy    reports that she has never smoked. She has never used smokeless tobacco. She reports that she does not drink alcohol and does not use drugs. family history includes COPD in her mother; Cancer (age of onset: 38) in her mother; Diabetes in her maternal grandfather and mother; Heart disease in her father and mother; Rheumatic fever in her father. Allergies  Allergen Reactions   Codeine Other (See Comments)   Methylprednisolone Itching   Current Outpatient Medications on File Prior to Visit  Medication Sig Dispense Refill   Cholecalciferol ([employer] D3) 50 MCG (2000 UT) CAPS Take by mouth.      cyclobenzaprine (FLEXERIL) 5 MG tablet Take 1 tablet (5 mg total) by mouth 3 (three) times daily as needed. 40 tablet 1   meclizine (ANTIVERT) 25 MG tablet Take 1 tablet (25 mg total) by mouth 2 (two) times daily as needed for dizziness. 30 tablet 2   Multiple Vitamins-Minerals (MULTIVITAMIN WITH MINERALS) tablet Take 1 tablet by mouth daily.     Multiple Vitamins-Minerals (PRESERVISION AREDS 2 PO) Take by mouth.     rosuvastatin (CRESTOR) 40 MG tablet Take 1 tablet by mouth once daily 90 tablet 3   triamcinolone (NASACORT) 55 MCG/ACT AERO nasal inhaler Place 2 sprays into the nose daily. 1  each 2   albuterol (VENTOLIN HFA) 108 (90 Base) MCG/ACT inhaler Inhale 2 puffs into the lungs every 6 (six) hours as needed for wheezing or shortness of breath. (Patient not taking: Reported on 01/17/2023) 8 g 0   celecoxib (CELEBREX) 200 MG capsule Take 1 capsule (200 mg total) by mouth 2 (two) times daily as needed. (Patient not taking: Reported on 11/06/2022) 180 capsule 1   Cyanocobalamin (CVS VITAMIN B-12 PO) Take 2,500 tablets by mouth every other day. (Patient not taking: Reported on 11/06/2022)     EQ ALLERGY RELIEF, CETIRIZINE, 10 MG tablet Take 1 tablet by mouth once daily (Patient not taking: Reported on 04/25/2023) 90 tablet 0   fluocinonide cream (LIDEX) 0.05 %  Apply 1 application  topically 2 (two) times daily. (Patient not taking: Reported on 04/25/2023)     gatifloxacin (ZYMAXID) 0.5 % SOLN Place 1 drop into the right eye 4 (four) times daily. (Patient not taking: Reported on 04/25/2023)     ketorolac (ACULAR) 0.5 % ophthalmic solution Place 1 drop into the right eye 4 (four) times daily. (Patient not taking: Reported on 04/25/2023)     lidocaine (LIDODERM) 5 % Place 1 patch onto the skin daily. Remove & Discard patch within 12 hours or as directed by MD (Patient not taking: Reported on 04/25/2023) 30 patch 0   prednisoLONE acetate (PRED FORTE) 1 % ophthalmic suspension Place 1 drop into the right eye 4 (four) times daily. (Patient not taking: Reported on 04/25/2023)     No current facility-administered medications on file prior to visit.        ROS:  All others reviewed and negative.  Objective        PE:  BP 132/80 (BP Location: Right Arm, Patient Position: Sitting, Cuff Size: Normal)   Pulse 75   Temp 98.5 F (36.9 C) (Oral)   Ht 5\' 2"  (1.575 m)   Wt 203 lb (92.1 kg)   SpO2 99%   BMI 37.13 kg/m                 Constitutional: Pt appears in NAD               HENT: Head: NCAT.                Right Ear: External ear normal.                 Left Ear: External ear normal.                Eyes: . Pupils are equal, round, and reactive to light. Conjunctivae and EOM are normal               Nose: without d/c or deformity               Neck: Neck supple. Gross normal ROM               Cardiovascular: Normal rate and regular rhythm.                 Pulmonary/Chest: Effort normal and breath sounds without rales or wheezing.                Abd:  Soft, NT, ND, + BS, no organomegaly               Neurological: Pt is alert. At baseline orientation, motor grossly intact               Skin: Skin is  warm. No rashes, no other new lesions, LE edema - none               Psychiatric: Pt behavior is normal without agitation   Micro: none  Cardiac  tracings I have personally interpreted today:  none  Pertinent Radiological findings (summarize): none   Lab Results  Component Value Date   WBC 6.5 07/22/2023   HGB 13.8 07/22/2023   HCT 42.7 07/22/2023   PLT 324.0 07/22/2023   GLUCOSE 107 (H) 07/22/2023   CHOL 162 07/22/2023   TRIG 191.0 (H) 07/22/2023   HDL 54.30 07/22/2023   LDLDIRECT 75.0 01/11/2023   LDLCALC 69 07/22/2023   ALT 25 07/22/2023   AST 20 07/22/2023   NA 141 07/22/2023   K 3.9 07/22/2023   CL 104 07/22/2023   CREATININE 0.67 07/22/2023   BUN 13 07/22/2023   CO2 30 07/22/2023   TSH 1.67 07/22/2023   INR 0.92 05/24/2011   HGBA1C 6.7 (H) 07/22/2023   MICROALBUR 1.3 07/22/2023   Assessment/Plan:  Debra Atkinson is a 78 y.o. White or Caucasian [1] female with  has a past medical history of Cerebrovascular disease (02/05/2019), Concussion ('06, '11), Diabetes mellitus, Family history of colon cancer (02/28/2016), Fibromyalgia (10/08/2013), Hyperlipidemia, and Varicella.  Malignant neoplasm of upper-outer quadrant of right breast in female, estrogen receptor positive (HCC) Pt requests gene testing to avoid further lab testing , has appt in f/u for wed 1/22  Encounter for well adult exam with abnormal findings Age and sex appropriate education and counseling updated with regular exercise and diet Referrals for preventative services - none needed Immunizations addressed - for shingirx at pharmacy, declines flu shot Smoking counseling  - none needed Evidence for depression or other mood disorder - worsening anxiety, declines need for change in tx Most recent labs reviewed. I have personally reviewed and have noted: 1) the patient's medical and social history 2) The patient's current medications and supplements 3) The patient's height, weight, and BMI have been recorded in the chart   B12 deficiency Lab Results  Component Value Date   VITAMINB12 769 07/22/2023   Stable, cont oral replacement - b12 1000 mcg  qd   Anxiety Worsening situational, pt declines need for now for change in tx or counseling referral  Hyperlipidemia Lab Results  Component Value Date   LDLCALC 69 07/22/2023   Stable, pt to continue current statin crestor 40 qd   Pre-diabetes Lab Results  Component Value Date   HGBA1C 6.7 (H) 07/22/2023   Mild worsening, pt to continue current medical treatment  - diet, wt control   Thyroid nodule With subjective increased right nodule - for f/u thyroid u/s  Vitamin D deficiency Last vitamin D Lab Results  Component Value Date   VD25OH 41.95 07/22/2023   Stable, cont oral replacement    Palpitations Etiology unclear, for cardiac event monitor Followup: Return in about 6 months (around 01/19/2024).  Oliver Barre, MD 07/24/2023 6:04 AM [facility]

## 2023-07-24 ENCOUNTER — Other Ambulatory Visit: Payer: Self-pay | Admitting: *Deleted

## 2023-07-24 ENCOUNTER — Inpatient Hospital Stay: Payer: Medicare HMO

## 2023-07-24 ENCOUNTER — Encounter: Payer: Self-pay | Admitting: *Deleted

## 2023-07-24 ENCOUNTER — Encounter: Payer: Self-pay | Admitting: Hematology

## 2023-07-24 ENCOUNTER — Ambulatory Visit: Payer: Medicare HMO | Admitting: Physical Therapy

## 2023-07-24 ENCOUNTER — Ambulatory Visit
Admission: RE | Admit: 2023-07-24 | Discharge: 2023-07-24 | Disposition: A | Discharge status: Home / Self Care | Payer: Medicare HMO | Source: Ambulatory Visit | Attending: Radiation Oncology | Admitting: Radiation Oncology

## 2023-07-24 ENCOUNTER — Inpatient Hospital Stay: Payer: Medicare HMO | Attending: Hematology | Admitting: Hematology

## 2023-07-24 ENCOUNTER — Encounter: Payer: Self-pay | Admitting: Internal Medicine

## 2023-07-24 ENCOUNTER — Other Ambulatory Visit: Payer: Self-pay | Admitting: General Surgery

## 2023-07-24 ENCOUNTER — Other Ambulatory Visit: Payer: Self-pay

## 2023-07-24 VITALS — BP 153/78 | HR 80 | Temp 98.0°F | Resp 18 | Ht 62.0 in | Wt 201.9 lb

## 2023-07-24 DIAGNOSIS — Z170 Estrogen receptor positive status [ER+]: Secondary | ICD-10-CM

## 2023-07-24 DIAGNOSIS — Z79899 Other long term (current) drug therapy: Secondary | ICD-10-CM | POA: Diagnosis not present

## 2023-07-24 DIAGNOSIS — C50411 Malignant neoplasm of upper-outer quadrant of right female breast: Secondary | ICD-10-CM | POA: Diagnosis not present

## 2023-07-24 DIAGNOSIS — R002 Palpitations: Secondary | ICD-10-CM | POA: Insufficient documentation

## 2023-07-24 LAB — RESEARCH LABS

## 2023-07-24 LAB — GENETIC SCREENING ORDER

## 2023-07-24 NOTE — Assessment & Plan Note (Signed)
Last vitamin D Lab Results  Component Value Date   VD25OH 41.95 07/22/2023   Stable, cont oral replacement

## 2023-07-24 NOTE — Assessment & Plan Note (Signed)
Lab Results  Component Value Date   VITAMINB12 769 07/22/2023   Stable, cont oral replacement - b12 1000 mcg qd

## 2023-07-24 NOTE — Research (Signed)
[employer] 2021-05 - Specimen Collection Study to Evaluate Biomarkers in Subjects with Cancer    Patient Debra Atkinson was identified by Debra Atkinson as a potential candidate for the above listed study.  This Clinical Research Nurse met with Debra Atkinson, Debra Atkinson, on 07/24/23 in a manner and location that ensures patient privacy to discuss participation in the above listed research study.  Patient is Accompanied by her friend .  A copy of the informed consent document with embedded HIPAA language was provided to the patient.  Patient reads, speaks, and understands [place].    Patient was provided with the business card of this Nurse and encouraged to contact the research team with any questions.  Patient was provided the option of taking informed consent documents home to review and was encouraged to review at their convenience with their support network, including other care providers. Patient is comfortable with making a decision regarding study participation today.  As outlined in the informed consent form, this Nurse and Debra Atkinson discussed the purpose of the research study, the investigational nature of the study, study procedures and requirements for study participation, potential risks and benefits of study participation, as well as alternatives to participation. This study is not blinded. The patient understands participation is voluntary and they may withdraw from study participation at any time.  This study does not involve randomization.  This study does not involve an investigational drug or device. This study does not involve a placebo. Patient understands enrollment is pending full eligibility review.   Confidentiality and how the patient's information will be used as part of study participation were discussed.  Patient was informed there is reimbursement provided for their time and effort spent on trial participation.  The patient is encouraged to discuss research study  participation with their insurance provider to determine what costs they may incur as part of study participation, including research related injury.    All questions were answered to patient's satisfaction.  The informed consent with embedded HIPAA language was reviewed page by page.  The patient's mental and emotional status is appropriate to provide informed consent, and the patient verbalizes an understanding of study participation.  Patient has agreed to participate in the above listed research study and has voluntarily signed the informed consent version [employer] approved version [14Jan2022- revised 30Jan2023] with embedded HIPAA language, version 14Jan2022, revised 30Jan2023  on 07/24/23 at 1:30PM.  The patient was provided with a copy of the signed informed consent form with embedded HIPAA language for their reference.  No study specific procedures were obtained prior to the signing of the informed consent document.  Approximately 25 minutes was spent with the patient reviewing the informed consent documents.  Patient was not requested to complete a Release of Information form.   Eligibility: This Nurse has reviewed this patient's inclusion and exclusion criteria with the patient and confirmed Debra Atkinson is eligible for study participation.  Debra Atkinson, research nurse, completed the 2nd review for this patient and confirmed the pt met all criteria for enrollment.  In addition, eligibility confirmed by treating investigator, Debra Atkinson, who also agrees that patient should proceed with enrollment. Patient will continue with enrollment.  Medical History:  The research nurse asked the pt the following questions about her current and past medical history.  The pt understands that this data is needed for study purposes.   Debra Atkinson is post menopausal.  Medical History:  High Blood Pressure  No Coronary Artery Disease  No Lupus    No Rheumatoid Arthritis  No Diabetes   No     Lynch  Syndrome  No  Is the patient currently taking a magnesium supplement?   No Does the patient have a personal history of cancer (greater than 5 years ago)?  No Does the patient have a family history of cancer in 1st or 2nd degree relatives? Yes If yes, Relationship(s) and Cancer type(s)? 1st degree- mother with breast and colon cancer and 2nd degree aunt with unknown cancer  Does the patient have history of alcohol consumption? No   Does the patient have history of cigarette, cigar, pipe, or chewing tobacco use?  No   Blood Collection: Research blood samples obtained by venipuncture.  Pt tolerated well with no adverse events.  Blood to be shipped by research specialist tomorrow.  Gift Card: $50 gift card given to the patient for her participation by Debra Atkinson, research specialist.    She was thanked for her participation in this data and specimen study.  The research nurse will request her tumor block to submit to the study and enter her study data.  Debra Ridge RN, BSN, CCRP Clinical Research Nurse Lead 07/24/2023 4:36 PM

## 2023-07-24 NOTE — Assessment & Plan Note (Addendum)
Age and sex appropriate education and counseling updated with regular exercise and diet Referrals for preventative services - none needed Immunizations addressed - for shingirx at pharmacy, declines flu shot Smoking counseling  - none needed Evidence for depression or other mood disorder - worsening anxiety, declines need for change in tx Most recent labs reviewed. I have personally reviewed and have noted: 1) the patient's medical and social history 2) The patient's current medications and supplements 3) The patient's height, weight, and BMI have been recorded in the chart

## 2023-07-24 NOTE — Assessment & Plan Note (Signed)
Lab Results  Component Value Date   HGBA1C 6.7 (H) 07/22/2023   Mild worsening, pt to continue current medical treatment  - diet, wt control

## 2023-07-24 NOTE — Assessment & Plan Note (Signed)
Lab Results  Component Value Date   LDLCALC 69 07/22/2023   Stable, pt to continue current statin crestor 40 qd

## 2023-07-24 NOTE — Assessment & Plan Note (Signed)
Worsening situational, pt declines need for now for change in tx or counseling referral

## 2023-07-24 NOTE — Research (Signed)
[employer] 2021-05 - Specimen Collection Study to Evaluate Biomarkers in Subjects with Cancer   This Nurse has reviewed this patient's inclusion and exclusion criteria as a second review and confirms Debra Atkinson is eligible for study participation.  Patient may continue with enrollment.  [name], RN, BSN, [facility]  Her  Hers Clinical Research Nurse [facility] Direct Dial [phone] 07/24/2023 2:28 PM
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
## 2023-07-24 NOTE — Assessment & Plan Note (Signed)
Etiology unclear, for cardiac event monitor

## 2023-07-24 NOTE — Assessment & Plan Note (Signed)
With subjective increased right nodule - for f/u thyroid u/s

## 2023-07-25 ENCOUNTER — Ambulatory Visit
Admission: RE | Admit: 2023-07-25 | Discharge: 2023-07-25 | Disposition: A | Discharge status: Home / Self Care | Payer: Medicare HMO | Source: Ambulatory Visit | Attending: Internal Medicine | Admitting: Internal Medicine

## 2023-07-25 ENCOUNTER — Telehealth: Payer: Self-pay | Admitting: Internal Medicine

## 2023-07-25 DIAGNOSIS — E041 Nontoxic single thyroid nodule: Secondary | ICD-10-CM

## 2023-07-25 DIAGNOSIS — E042 Nontoxic multinodular goiter: Secondary | ICD-10-CM | POA: Diagnosis not present
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
## 2023-07-25 NOTE — Telephone Encounter (Signed)
Copied from CRM [id number]. Topic: General - Call Back - No Documentation >> Jul 25, 2023  2:26 PM Debra Atkinson B wrote: Reason for CRM: Pt would like to request a callback to help her put together her heart monitor.  ---  Does pt need an OV or nurse visit for this? TDW

## 2023-07-26 NOTE — Telephone Encounter (Signed)
Copied from CRM [id number]. Topic: Clinical - Medical Advice >> Jul 26, 2023  2:11 PM Sonny Dandy B wrote: Reason for CRM: pt called to speak with provider, states she has a heart monitor she supposed to wear for a month, Pt just found out she has breast cancer, will be having surgery to remove the cancer. Pt is asking if she should wear the monitor or not pt is requesting call back at [phone]

## 2023-07-26 NOTE — Telephone Encounter (Signed)
Called and spoke with Pt she states someone from heart care called and stated someone will call her back to help put the monitor together.

## 2023-07-26 NOTE — Telephone Encounter (Signed)
Yes, this was discussed at her visit as well  I would still recommend the monitor as if she had a heart rhythm problem, this would be important to know during her other treatment as well for cancer

## 2023-07-28 ENCOUNTER — Encounter: Payer: Self-pay | Admitting: Internal Medicine

## 2023-07-29 NOTE — Telephone Encounter (Signed)
Called and let Pt know

## 2023-07-30 DIAGNOSIS — R002 Palpitations: Secondary | ICD-10-CM | POA: Diagnosis not present

## 2023-08-01 ENCOUNTER — Telehealth: Payer: Self-pay | Admitting: *Deleted

## 2023-08-01 ENCOUNTER — Encounter: Payer: Self-pay | Admitting: *Deleted

## 2023-08-01 NOTE — Telephone Encounter (Signed)
Spoke with patient to follow up from [facility] 1/22 and assess navigation needs. Patient denies any questions or concerns at this time other than be nervious about sx. Gave support and encouraged her to call should anything arise.

## 2023-08-05 ENCOUNTER — Encounter: Payer: Self-pay | Admitting: Diagnostic Radiology

## 2023-08-06 ENCOUNTER — Telehealth: Payer: Self-pay | Admitting: *Deleted

## 2023-08-06 NOTE — Telephone Encounter (Signed)
 Received call from patient that she has been wearing a cardiac event monitor for about 2 weeks now and is supposed to continue wearing for a month for palpitations and shortness of breath.  She states she thinks this is related to her anxiety over her new cancer diagnosis.  Informed we would to let dr. Ebbie know since she is scheduled for sx 2/12. Patient doesn't really want to postpone her sx.

## 2023-08-07 ENCOUNTER — Encounter (HOSPITAL_BASED_OUTPATIENT_CLINIC_OR_DEPARTMENT_OTHER): Payer: Self-pay | Admitting: General Surgery

## 2023-08-07 ENCOUNTER — Other Ambulatory Visit: Payer: Self-pay

## 2023-08-07 NOTE — Progress Notes (Signed)
   08/07/23 0947  PAT Phone Screen  Is the patient taking a GLP-1 receptor agonist? No  Do You Have Diabetes? (S)  No (prediabetes, no meds)  Do You Have Hypertension? No  Have You Ever Been to the ER for Asthma? No  Have You Taken Oral Steroids in the Past 3 Months? No  Do you Take Phenteramine or any Other Diet Drugs? No  Recent  Lab Work, EKG, CXR? Yes  Where was this test performed? 07-24-23 CBC/diff, CMP, A1c 6.7  Do you have a history of heart problems? (S)  No (patient's PCP ordered cardiac event monitor for palpitations which she currently has on)  Any Recent Hospitalizations? No  Height 5' 2 (1.575 m)  Weight 91.6 kg  Pat Appointment Scheduled (S)  Yes (EKG)

## 2023-08-08 ENCOUNTER — Ambulatory Visit: Payer: Self-pay | Admitting: Internal Medicine

## 2023-08-08 ENCOUNTER — Encounter: Payer: Self-pay | Admitting: Internal Medicine

## 2023-08-08 NOTE — Telephone Encounter (Signed)
 Yes ok to take off the day prior to surgury and turn in early if she can do this, thanks

## 2023-08-08 NOTE — Telephone Encounter (Signed)
  Chief Complaint: Patient requesting clarification regarding wearing her continuous heart monitor with upcoming EKG and surgery. Please advise.   Copied from CRM [id number]. Topic: Clinical - Medical Advice >> Aug 08, 2023 11:57 AM Macario HERO wrote: Reason for CRM: Patient stated she is wearing a heart monitor and she has an EKG tomorrow for her surgery 08/14/23 and wants to know if she should put it on after and then take off for her surgery. She is requesting a call back because she is a little nervous on how to handle this.

## 2023-08-08 NOTE — Telephone Encounter (Signed)
 Called and let Pt know

## 2023-08-09 ENCOUNTER — Encounter (HOSPITAL_BASED_OUTPATIENT_CLINIC_OR_DEPARTMENT_OTHER)
Admission: RE | Admit: 2023-08-09 | Discharge: 2023-08-09 | Disposition: A | Discharge status: Home / Self Care | Payer: Medicare HMO | Source: Ambulatory Visit | Attending: General Surgery | Admitting: General Surgery

## 2023-08-09 DIAGNOSIS — Z01812 Encounter for preprocedural laboratory examination: Secondary | ICD-10-CM | POA: Insufficient documentation

## 2023-08-09 MED ORDER — CHLORHEXIDINE GLUCONATE CLOTH 2 % EX PADS: 6.0000 | MEDICATED_PAD | Freq: Once | CUTANEOUS | Status: DC

## 2023-08-09 MED ORDER — ENSURE PRE-SURGERY PO LIQD: 296.0000 mL | Freq: Once | ORAL | Status: DC

## 2023-08-09 NOTE — Progress Notes (Signed)

## 2023-08-12 ENCOUNTER — Encounter: Payer: Self-pay | Admitting: Family Medicine

## 2023-08-12 ENCOUNTER — Ambulatory Visit (INDEPENDENT_AMBULATORY_CARE_PROVIDER_SITE_OTHER): Payer: Medicare HMO | Admitting: Family Medicine

## 2023-08-12 ENCOUNTER — Telehealth: Payer: Self-pay | Admitting: *Deleted

## 2023-08-12 VITALS — BP 138/78 | HR 78 | Temp 98.6°F | Resp 18 | Ht 62.0 in | Wt 197.0 lb

## 2023-08-12 DIAGNOSIS — B369 Superficial mycosis, unspecified: Secondary | ICD-10-CM | POA: Diagnosis not present

## 2023-08-12 MED ORDER — FLUCONAZOLE 150 MG PO TABS: 150.0000 mg | ORAL_TABLET | Freq: Once | ORAL | 0 refills | Status: AC

## 2023-08-12 MED ORDER — NYSTATIN 100000 UNIT/GM EX CREA
1.0000 | TOPICAL_CREAM | Freq: Two times a day (BID) | CUTANEOUS | 0 refills | Status: DC
Start: 2023-08-12 — End: 2023-12-20

## 2023-08-12 NOTE — Progress Notes (Signed)
 Assessment & Plan:  1. Fungal infection of skin (Primary) Education provided on fungal skin infections. Save Diflucan  for after surgery on Wednesday if she is continuing to have symptoms. Suggested changing shower soap to one for sensitive skin.  - nystatin  cream (MYCOSTATIN ); Apply 1 Application topically 2 (two) times daily.  Dispense: 60 g; Refill: 0 - fluconazole  (DIFLUCAN ) 150 MG tablet; Take 1 tablet (150 mg total) by mouth once for 1 dose. May repeat after 3 days if needed.  Dispense: 1 tablet; Refill: 0   Follow up plan: Return if symptoms worsen or fail to improve.  Hershel Los, MSN, APRN, FNP-C  Subjective:  HPI: Debra Atkinson is a 78 y.o. female presenting on 08/12/2023 for Rash (Rash - yeast like area in buttock area - itches at times - x 1 week /Note: having breast cancer -lumpectomy on WED AM (already had clearance) /)  Patient reports an itchy rash in her intergluteal cleft x1 week. She washes with Dial Antibacterial Soap daily.    ROS: Negative unless specifically indicated above in HPI.   Relevant past medical history reviewed and updated as indicated.   Allergies and medications reviewed and updated.   Current Outpatient Medications:    cetirizine  (ZYRTEC ) 10 MG tablet, Take 10 mg by mouth daily., Disp: , Rfl:    Cholecalciferol ([employer] D3) 50 MCG (2000 UT) CAPS, Take by mouth. , Disp: , Rfl:    meclizine  (ANTIVERT ) 25 MG tablet, Take 25 mg by mouth 3 (three) times daily as needed for dizziness., Disp: , Rfl:    Multiple Vitamins-Minerals (MULTIVITAMIN WITH MINERALS) tablet, Take 1 tablet by mouth daily., Disp: , Rfl:    Multiple Vitamins-Minerals (PRESERVISION AREDS 2 PO), Take by mouth., Disp: , Rfl:    rosuvastatin  (CRESTOR ) 40 MG tablet, Take 1 tablet by mouth once daily, Disp: 90 tablet, Rfl: 3   triamcinolone  (NASACORT ) 55 MCG/ACT AERO nasal inhaler, Place 2 sprays into the nose daily. (Patient not taking: Reported on 08/12/2023), Disp: 1 each, Rfl: 2 No  current facility-administered medications for this visit.  Facility-Administered Medications Ordered in Other Visits:    6 CHG cloth bath night before surgery, , , Once **AND** 6 CHG cloth bath AM of surgery, , , Once **AND** Chlorhexidine  Gluconate Cloth 2 % PADS 6 each, 6 each, Topical, Once **AND** Chlorhexidine  Gluconate Cloth 2 % PADS 6 each, 6 each, Topical, Once, Enid Harry, MD   feeding supplement (ENSURE PRE-SURGERY) liquid 296 mL, 296 mL, Oral, Once, Enid Harry, MD  Allergies  Allergen Reactions   Codeine  Other (See Comments)   Methylprednisolone  Itching    With shots only    Objective:   BP 138/78   Pulse 78   Temp 98.6 F (37 C)   Resp 18   Ht 5\' 2"  (1.575 m)   Wt 197 lb (89.4 kg)   SpO2 95%   BMI 36.03 kg/m    Physical Exam Vitals reviewed.  Constitutional:      General: She is not in acute distress.    Appearance: Normal appearance. She is not ill-appearing, toxic-appearing or diaphoretic.  HENT:     Head: Normocephalic and atraumatic.  Eyes:     General: No scleral icterus.       Right eye: No discharge.        Left eye: No discharge.     Conjunctiva/sclera: Conjunctivae normal.  Cardiovascular:     Rate and Rhythm: Normal rate.  Pulmonary:     Effort: Pulmonary effort  is normal. No respiratory distress.  Musculoskeletal:        General: Normal range of motion.     Cervical back: Normal range of motion.  Skin:    General: Skin is warm and dry.     Capillary Refill: Capillary refill takes less than 2 seconds.     Findings: Rash (erythematous fungal rash in the intergluteal cleft) present.  Neurological:     General: No focal deficit present.     Mental Status: She is alert and oriented to person, place, and time. Mental status is at baseline.  Psychiatric:        Mood and Affect: Mood normal.        Behavior: Behavior normal.        Thought Content: Thought content normal.        Judgment: Judgment normal.

## 2023-08-12 NOTE — Telephone Encounter (Signed)
 Copied from CRM [id number]. Topic: Clinical - Medical Advice >> Aug 12, 2023  8:15 AM Allyne Areola wrote: Reason for CRM: Patient is scheduled for surgery tomorrow; however, she is breaking out with a rash on her rectum, she would like to see Dr.John to see if she is still able to have the surgery, I advised he did not have any openings for today. She would like to know if Dr.John can make an exception due to the surgery and see her today.     Pt called and appt made today with Britney Joyce for the rash at 11 am

## 2023-08-13 DIAGNOSIS — D0511 Intraductal carcinoma in situ of right breast: Secondary | ICD-10-CM | POA: Diagnosis not present

## 2023-08-13 NOTE — Anesthesia Preprocedure Evaluation (Signed)
Anesthesia Evaluation  Patient identified by MRN, date of birth, ID band Patient awake    Reviewed: Allergy & Precautions, NPO status , Patient's Chart, lab work & pertinent test results  Airway Mallampati: II  TM Distance: >3 FB Neck ROM: Full    Dental no notable dental hx. (+) Teeth Intact, Dental Advisory Given   Pulmonary neg pulmonary ROS   Pulmonary exam normal breath sounds clear to auscultation       Cardiovascular Normal cardiovascular exam Rhythm:Regular Rate:Normal  01/2019 Myoview  Nuclear stress EF: 70%.  There was no ST segment deviation noted during stress.  No T wave inversion was noted during stress.  The study is normal.  This is a low risk study.  The left ventricular ejection fraction is hyperdynamic (>65%).  No change compared to prior study.   Low risk study, with normal perfusion and hyperdynamic function.    Neuro/Psych   Anxiety     negative neurological ROS     GI/Hepatic   Endo/Other    Renal/GU      Musculoskeletal  (+) Arthritis ,  Fibromyalgia -  Abdominal  (+) + obese (BMI 36.02)  Peds  Hematology   Anesthesia Other Findings All: Codeine, methylprednisolone  R breast CA  Reproductive/Obstetrics                             Anesthesia Physical Anesthesia Plan  ASA: 2  Anesthesia Plan: General   Post-op Pain Management: Precedex and Tylenol PO (pre-op)*   Induction: Intravenous  PONV Risk Score and Plan: 4 or greater and Propofol infusion, TIVA, Treatment may vary due to age or medical condition and Ondansetron  Airway Management Planned: LMA  Additional Equipment: None  Intra-op Plan:   Post-operative Plan: Extubation in OR  Informed Consent: I have reviewed the patients History and Physical, chart, labs and discussed the procedure including the risks, benefits and alternatives for the proposed anesthesia with the patient or  authorized representative who has indicated his/her understanding and acceptance.     Dental advisory given  Plan Discussed with: CRNA and Surgeon  Anesthesia Plan Comments:         Anesthesia Quick Evaluation

## 2023-08-14 ENCOUNTER — Ambulatory Visit (HOSPITAL_BASED_OUTPATIENT_CLINIC_OR_DEPARTMENT_OTHER): Payer: Self-pay | Admitting: Anesthesiology

## 2023-08-14 ENCOUNTER — Encounter (HOSPITAL_BASED_OUTPATIENT_CLINIC_OR_DEPARTMENT_OTHER)
Admission: RE | Disposition: A | Discharge status: Home / Self Care | Payer: Self-pay | Source: Home / Self Care | Attending: General Surgery

## 2023-08-14 ENCOUNTER — Other Ambulatory Visit: Payer: Self-pay

## 2023-08-14 ENCOUNTER — Encounter (HOSPITAL_BASED_OUTPATIENT_CLINIC_OR_DEPARTMENT_OTHER): Payer: Self-pay | Admitting: General Surgery

## 2023-08-14 ENCOUNTER — Ambulatory Visit (HOSPITAL_BASED_OUTPATIENT_CLINIC_OR_DEPARTMENT_OTHER)
Admission: RE | Admit: 2023-08-14 | Discharge: 2023-08-14 | Disposition: A | Discharge status: Home / Self Care | Payer: Medicare HMO | Attending: General Surgery | Admitting: General Surgery

## 2023-08-14 DIAGNOSIS — C50911 Malignant neoplasm of unspecified site of right female breast: Secondary | ICD-10-CM | POA: Diagnosis not present

## 2023-08-14 DIAGNOSIS — Z1721 Progesterone receptor positive status: Secondary | ICD-10-CM | POA: Insufficient documentation

## 2023-08-14 DIAGNOSIS — Z170 Estrogen receptor positive status [ER+]: Secondary | ICD-10-CM | POA: Diagnosis not present

## 2023-08-14 DIAGNOSIS — Z1732 Human epidermal growth factor receptor 2 negative status: Secondary | ICD-10-CM | POA: Diagnosis not present

## 2023-08-14 DIAGNOSIS — N6041 Mammary duct ectasia of right breast: Secondary | ICD-10-CM | POA: Insufficient documentation

## 2023-08-14 DIAGNOSIS — R002 Palpitations: Secondary | ICD-10-CM

## 2023-08-14 DIAGNOSIS — C50411 Malignant neoplasm of upper-outer quadrant of right female breast: Secondary | ICD-10-CM | POA: Insufficient documentation

## 2023-08-14 DIAGNOSIS — N6021 Fibroadenosis of right breast: Secondary | ICD-10-CM | POA: Insufficient documentation

## 2023-08-14 HISTORY — DX: Anxiety disorder, unspecified: F41.9

## 2023-08-14 HISTORY — DX: Dizziness and giddiness: R42

## 2023-08-14 HISTORY — PX: BREAST LUMPECTOMY WITH RADIOACTIVE SEED LOCALIZATION: SHX6424

## 2023-08-14 HISTORY — DX: Prediabetes: R73.03

## 2023-08-14 SURGERY — BREAST LUMPECTOMY WITH RADIOACTIVE SEED LOCALIZATION
Anesthesia: General | Site: Breast | Laterality: Right

## 2023-08-14 MED ORDER — ACETAMINOPHEN 500 MG PO TABS
1000.0000 mg | ORAL_TABLET | Freq: Once | ORAL | Status: AC
  Administered 2023-08-14: 1000 mg via ORAL

## 2023-08-14 MED ORDER — DEXAMETHASONE SODIUM PHOSPHATE 10 MG/ML IJ SOLN
INTRAMUSCULAR | Status: AC
  Filled 2023-08-14: qty 1

## 2023-08-14 MED ORDER — PHENYLEPHRINE 80 MCG/ML (10ML) SYRINGE FOR IV PUSH (FOR BLOOD PRESSURE SUPPORT)
PREFILLED_SYRINGE | INTRAVENOUS | Status: AC
  Filled 2023-08-14: qty 10

## 2023-08-14 MED ORDER — FENTANYL CITRATE (PF) 100 MCG/2ML IJ SOLN
25.0000 ug | INTRAMUSCULAR | Status: DC | PRN
  Administered 2023-08-14: 25 ug via INTRAVENOUS

## 2023-08-14 MED ORDER — OXYCODONE HCL 5 MG PO TABS
5.0000 mg | ORAL_TABLET | Freq: Once | ORAL | Status: AC
  Administered 2023-08-14: 5 mg via ORAL

## 2023-08-14 MED ORDER — ONDANSETRON 4 MG PO TBDP
ORAL_TABLET | ORAL | Status: AC
  Filled 2023-08-14: qty 1

## 2023-08-14 MED ORDER — LIDOCAINE 2% (20 MG/ML) 5 ML SYRINGE
INTRAMUSCULAR | Status: AC
  Filled 2023-08-14: qty 5

## 2023-08-14 MED ORDER — PROPOFOL 1000 MG/100ML IV EMUL
INTRAVENOUS | Status: AC
  Filled 2023-08-14: qty 300

## 2023-08-14 MED ORDER — CEFAZOLIN SODIUM-DEXTROSE 2-4 GM/100ML-% IV SOLN
INTRAVENOUS | Status: AC
  Filled 2023-08-14: qty 100

## 2023-08-14 MED ORDER — ONDANSETRON HCL 4 MG/2ML IJ SOLN
INTRAMUSCULAR | Status: AC
  Filled 2023-08-14: qty 2

## 2023-08-14 MED ORDER — PROPOFOL 10 MG/ML IV BOLUS
INTRAVENOUS | Status: AC
  Filled 2023-08-14: qty 20

## 2023-08-14 MED ORDER — EPHEDRINE 5 MG/ML INJ
INTRAVENOUS | Status: AC
  Filled 2023-08-14: qty 5

## 2023-08-14 MED ORDER — SUCCINYLCHOLINE CHLORIDE 200 MG/10ML IV SOSY
PREFILLED_SYRINGE | INTRAVENOUS | Status: AC
  Filled 2023-08-14: qty 10

## 2023-08-14 MED ORDER — FENTANYL CITRATE (PF) 100 MCG/2ML IJ SOLN
INTRAMUSCULAR | Status: AC
  Filled 2023-08-14: qty 2

## 2023-08-14 MED ORDER — PROPOFOL 10 MG/ML IV BOLUS
INTRAVENOUS | Status: DC | PRN
  Administered 2023-08-14: 200 ug/kg/min via INTRAVENOUS
  Administered 2023-08-14: 200 mg via INTRAVENOUS

## 2023-08-14 MED ORDER — BUPIVACAINE HCL (PF) 0.25 % IJ SOLN
INTRAMUSCULAR | Status: AC
  Filled 2023-08-14: qty 30

## 2023-08-14 MED ORDER — SODIUM CHLORIDE 0.9 % IV SOLN
INTRAVENOUS | Status: DC | PRN
Start: 2023-08-14 — End: 2023-08-14

## 2023-08-14 MED ORDER — ONDANSETRON HCL 4 MG/2ML IJ SOLN
4.0000 mg | Freq: Once | INTRAMUSCULAR | Status: AC | PRN
  Administered 2023-08-14: 4 mg via INTRAVENOUS

## 2023-08-14 MED ORDER — LACTATED RINGERS IV SOLN
INTRAVENOUS | Status: DC
Start: 2023-08-14 — End: 2023-08-14

## 2023-08-14 MED ORDER — ACETAMINOPHEN 10 MG/ML IV SOLN: 1000.0000 mg | Freq: Once | INTRAVENOUS | Status: DC | PRN

## 2023-08-14 MED ORDER — ACETAMINOPHEN 500 MG PO TABS: 1000.0000 mg | ORAL_TABLET | ORAL | Status: AC

## 2023-08-14 MED ORDER — FENTANYL CITRATE (PF) 250 MCG/5ML IJ SOLN
INTRAMUSCULAR | Status: DC | PRN
  Administered 2023-08-14: 25 ug via INTRAVENOUS
  Administered 2023-08-14: 50 ug via INTRAVENOUS
  Administered 2023-08-14: 25 ug via INTRAVENOUS

## 2023-08-14 MED ORDER — CEFAZOLIN SODIUM-DEXTROSE 2-4 GM/100ML-% IV SOLN
2.0000 g | INTRAVENOUS | Status: AC
  Administered 2023-08-14: 2 g via INTRAVENOUS

## 2023-08-14 MED ORDER — OXYCODONE HCL 5 MG PO TABS
ORAL_TABLET | ORAL | Status: AC
  Filled 2023-08-14: qty 1

## 2023-08-14 MED ORDER — LIDOCAINE 2% (20 MG/ML) 5 ML SYRINGE
INTRAMUSCULAR | Status: DC | PRN
  Administered 2023-08-14: 100 mg via INTRAVENOUS

## 2023-08-14 MED ORDER — MIDAZOLAM HCL 2 MG/2ML IJ SOLN
INTRAMUSCULAR | Status: AC
  Filled 2023-08-14: qty 2

## 2023-08-14 MED ORDER — DEXMEDETOMIDINE HCL IN NACL 80 MCG/20ML IV SOLN
INTRAVENOUS | Status: DC | PRN
  Administered 2023-08-14: 6 ug via INTRAVENOUS

## 2023-08-14 MED ORDER — PHENYLEPHRINE HCL-NACL 20-0.9 MG/250ML-% IV SOLN
INTRAVENOUS | Status: AC
  Filled 2023-08-14: qty 500

## 2023-08-14 MED ORDER — BUPIVACAINE HCL (PF) 0.25 % IJ SOLN
INTRAMUSCULAR | Status: DC | PRN
  Administered 2023-08-14: 10 mL

## 2023-08-14 MED ORDER — ACETAMINOPHEN 500 MG PO TABS
ORAL_TABLET | ORAL | Status: AC
  Filled 2023-08-14: qty 2

## 2023-08-14 MED ORDER — ROCURONIUM BROMIDE 10 MG/ML (PF) SYRINGE
PREFILLED_SYRINGE | INTRAVENOUS | Status: AC
  Filled 2023-08-14: qty 10

## 2023-08-14 MED ORDER — DEXAMETHASONE SODIUM PHOSPHATE 10 MG/ML IJ SOLN
INTRAMUSCULAR | Status: DC | PRN
  Administered 2023-08-14: 10 mg via INTRAVENOUS

## 2023-08-14 SURGICAL SUPPLY — 48 items
APPLIER CLIP 9.375 MED OPEN (MISCELLANEOUS)
BINDER BREAST LRG (GAUZE/BANDAGES/DRESSINGS) IMPLANT
BINDER BREAST MEDIUM (GAUZE/BANDAGES/DRESSINGS) IMPLANT
BINDER BREAST XLRG (GAUZE/BANDAGES/DRESSINGS) IMPLANT
BINDER BREAST XXLRG (GAUZE/BANDAGES/DRESSINGS) IMPLANT
BLADE SURG 15 STRL LF DISP TIS (BLADE) ×1 IMPLANT
CANISTER SUC SOCK COL 7IN (MISCELLANEOUS) IMPLANT
CANISTER SUCT 1200ML W/VALVE (MISCELLANEOUS) IMPLANT
CHLORAPREP W/TINT 26 (MISCELLANEOUS) ×1 IMPLANT
CLIP APPLIE 9.375 MED OPEN (MISCELLANEOUS) IMPLANT
CLIP TI WIDE RED SMALL 6 (CLIP) IMPLANT
COVER BACK TABLE 60X90IN (DRAPES) ×1 IMPLANT
COVER MAYO STAND STRL (DRAPES) ×1 IMPLANT
COVER PROBE CYLINDRICAL 5X96 (MISCELLANEOUS) ×1 IMPLANT
DERMABOND ADVANCED .7 DNX12 (GAUZE/BANDAGES/DRESSINGS) ×1 IMPLANT
DRAPE LAPAROSCOPIC ABDOMINAL (DRAPES) ×1 IMPLANT
DRAPE UTILITY XL STRL (DRAPES) ×1 IMPLANT
DRSG TEGADERM 4X4.75 (GAUZE/BANDAGES/DRESSINGS) IMPLANT
ELECT COATED BLADE 2.86 ST (ELECTRODE) ×1 IMPLANT
ELECT REM PT RETURN 9FT ADLT (ELECTROSURGICAL) ×1
ELECTRODE REM PT RTRN 9FT ADLT (ELECTROSURGICAL) ×1 IMPLANT
GAUZE SPONGE 4X4 12PLY STRL LF (GAUZE/BANDAGES/DRESSINGS) IMPLANT
GLOVE BIO SURGEON STRL SZ7 (GLOVE) ×2 IMPLANT
GLOVE BIOGEL PI IND STRL 7.5 (GLOVE) ×1 IMPLANT
GOWN STRL REUS W/ TWL LRG LVL3 (GOWN DISPOSABLE) ×2 IMPLANT
HEMOSTAT ARISTA ABSORB 3G PWDR (HEMOSTASIS) IMPLANT
KIT MARKER MARGIN INK (KITS) ×1 IMPLANT
NDL HYPO 25X1 1.5 SAFETY (NEEDLE) ×1 IMPLANT
NEEDLE HYPO 25X1 1.5 SAFETY (NEEDLE) ×1
NS IRRIG 1000ML POUR BTL (IV SOLUTION) IMPLANT
PACK BASIN DAY SURGERY FS (CUSTOM PROCEDURE TRAY) ×1 IMPLANT
PENCIL SMOKE EVACUATOR (MISCELLANEOUS) ×1 IMPLANT
RETRACTOR ONETRAX LX 90X20 (MISCELLANEOUS) IMPLANT
SLEEVE SCD COMPRESS KNEE MED (STOCKING) ×1 IMPLANT
SPIKE FLUID TRANSFER (MISCELLANEOUS) IMPLANT
SPONGE T-LAP 4X18 ~~LOC~~+RFID (SPONGE) ×1 IMPLANT
STRIP CLOSURE SKIN 1/2X4 (GAUZE/BANDAGES/DRESSINGS) ×1 IMPLANT
SUT MNCRL AB 4-0 PS2 18 (SUTURE) ×1 IMPLANT
SUT MON AB 5-0 PS2 18 (SUTURE) IMPLANT
SUT SILK 2 0 SH (SUTURE) IMPLANT
SUT VIC AB 2-0 SH 27XBRD (SUTURE) ×1 IMPLANT
SUT VIC AB 3-0 SH 27X BRD (SUTURE) ×1 IMPLANT
SUT VIC AB 5-0 PS2 18 (SUTURE) IMPLANT
SYR CONTROL 10ML LL (SYRINGE) ×1 IMPLANT
TOWEL GREEN STERILE FF (TOWEL DISPOSABLE) ×1 IMPLANT
TRAY FAXITRON CT DISP (TRAY / TRAY PROCEDURE) ×1 IMPLANT
TUBE CONNECTING 20X1/4 (TUBING) IMPLANT
YANKAUER SUCT BULB TIP NO VENT (SUCTIONS) IMPLANT

## 2023-08-14 NOTE — Interval H&P Note (Signed)
History and Physical Interval Note:  08/14/2023 8:12 AM  Debra Atkinson  has presented today for surgery, with the diagnosis of RIGHT BREAST CANCER.  The various methods of treatment have been discussed with the patient and family. After consideration of risks, benefits and other options for treatment, the patient has consented to  Procedure(s): RIGHT BREAST SEED GUIDED LUMPECTOMY (Right) as a surgical intervention.  The patient's history has been reviewed, patient examined, no change in status, stable for surgery.  I have reviewed the patient's chart and labs.  Questions were answered to the patient's satisfaction.     Emelia Loron

## 2023-08-14 NOTE — Op Note (Signed)
Preoperative diagnosis: clinical stage I right breast cancer Postoperative diagnosis: Same as above Procedure: Right breast radioactive seed guided lumpectomy Surgeon: Dr. Harden Mo Anesthesia: General Specimens: Right breast tissue containing seed and clip to pathology, additional posterior, medial and inferior margins marked short superior, long lateral, double deep Estimated blood loss: Minimal Complications: None Drains:none Sponge no count was correct completion Disposition to recovery stable condition   Indications: 78 yof with mass in right UOQ on screening mm. She has fh in mom at age 75. There is 5 mm mass on [place]. Axillary [place] is negative. 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    Procedure: After informed consent was obtained she was taken to the operating room.  She had had a seed placed prior to beginning I had these mammograms available.  She had SCDs in place.  She was given antibiotics.  She was placed under general anesthesia without complication.  She was prepped and draped in a standard sterile surgical fashion.  A surgical timeout was then performed.   The seed was located in the very UOQ.  I made a curvilinear incision overlying the seed after infiltrating the entire area with marcaine.  I then used the neoprobe to remove the seed and some of the surrounding tissue with an attempt to get a clear margin. I did take some additional margins as I thought I might be close. The posterior margin was the muscle.  Mammogram confirmed removal of the seed and the clip.  I then obtained hemostasis.  I closed this with 2-0 Vicryl.  The skin was closed with 3-0 Vicryl and 4-0 Monocryl.  Glue and Steri-Strips were applied.  She tolerated this well and was transferred recovery stable.

## 2023-08-14 NOTE — Discharge Instructions (Addendum)
[facility] Office Phone Number [phone]  POST OP INSTRUCTIONS Take 400 mg of ibuprofen every 8 hours or 650 mg tylenol every 6 hours for next 72 hours then as needed. Use ice several times daily also.  Take your usually prescribed medications unless otherwise directed You should eat very light the first 24 hours after surgery, such as soup, crackers, pudding, etc.  Resume your normal diet the day after surgery. Most patients will experience some swelling and bruising in the breast.  Ice packs and a good support bra will help.  Wear the postop bra for 72 hours day and night.  After that wear a sports bra during the day until you return to the office. Swelling and bruising can take several days to resolve.  It is common to experience some constipation if taking pain medication after surgery.  Increasing fluid intake and taking a stool softener will usually help or prevent this problem from occurring.  A mild laxative (Milk of Magnesia or Miralax) should be taken according to package directions if there are no bowel movements after 48 hours. I used skin glue on the incision, you may shower in 24 hours.  The glue will flake off over the next 2-3 weeks.  Any sutures or staples will be removed at the office during your follow-up visit. ACTIVITIES:  You may resume regular daily activities (gradually increasing) beginning the next day.  Wearing a good support bra or sports bra minimizes pain and swelling.  You may have sexual intercourse when it is comfortable. You may drive when you no longer are taking prescription pain medication, you can comfortably wear a seatbelt, and you can safely maneuver your car and apply brakes. RETURN TO WORK:  ______________________________________________________________________________________ Debra Atkinson should see your doctor in the office for a follow-up appointment approximately two weeks after your surgery.  Your doctor's nurse will typically make your  follow-up appointment when she calls you with your pathology report.  Expect your pathology report 3-4 business days after your surgery.  You may call to check if you do not hear from [place] after three days. OTHER INSTRUCTIONS: _______________________________________________________________________________________________ _____________________________________________________________________________________________________________________________________ _____________________________________________________________________________________________________________________________________ _____________________________________________________________________________________________________________________________________  WHEN TO CALL DR WAKEFIELD: Fever over 101.0 Nausea and/or vomiting. Extreme swelling or bruising. Continued bleeding from incision. Increased pain, redness, or drainage from the incision.  The clinic staff is available to answer your questions during regular business hours.  Please don't hesitate to call and ask to speak to one of the nurses for clinical concerns.  If you have a medical emergency, go to the nearest emergency room or call 911.  A surgeon from [facility] is always on call at the hospital.  For further questions, please visit [email or web address] m[facility] No Tylenol until 1:00pm.

## 2023-08-14 NOTE — H&P (Signed)
78 yof with mass in right UOQ on screening mm. She has fh in mom at age 78. There is 5 mm mass on [place]. Axillary [place] is negative. 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 She is here to discuss options  Review of Systems: A complete review of systems was obtained from the patient. I have reviewed this information and discussed as appropriate with the patient. See HPI as well for other ROS.  Review of Systems  HENT: Positive for tinnitus.  Respiratory: Positive for shortness of breath.  Musculoskeletal: Positive for back pain and joint pain.  All other systems reviewed and are negative.  Medical History: Past Medical History:  Diagnosis Date  Anemia  Anxiety  Arrhythmia  Arthritis  History of cancer  History of myocardial infarction  Hyperlipidemia  Hypertension  Thyroid disease   Patient Active Problem List  Diagnosis  Hyperlipidemia  Malignant neoplasm of upper-outer quadrant of right breast in female, estrogen receptor positive (CMS/HHS-HCC)   Past Surgical History:  Procedure Laterality Date  APPENDECTOMY N/A  1979  CESAREAN SECTION N/A  1978  CHOLECYSTECTOMY N/A  1979   Allergies  Allergen Reactions  Codeine Unknown and Other (See Comments)  Methylprednisolone Itching   Current Outpatient Medications on File Prior to Visit  Medication Sig Dispense Refill  albuterol MDI, PROVENTIL, VENTOLIN, PROAIR, HFA 90 mcg/actuation inhaler Inhale 2 Inhalations into the lungs every 6 (six) hours as needed  ketorolac (ACULAR) 0.5 % ophthalmic solution Apply 1 drop to eye 4 (four) times daily  prednisoLONE acetate (PRED FORTE) 1 % ophthalmic suspension Apply 1 drop to eye 4 (four) times daily  rosuvastatin (CRESTOR) 40 MG tablet Take 1 tablet by mouth once daily  multivitamin with minerals tablet Take 1 tablet by mouth once daily    Family History  Problem Relation Age of Onset  Hyperlipidemia (Elevated cholesterol) Mother  Diabetes  Mother  Colon cancer Mother  Breast cancer Mother  Coronary Artery Disease (Blocked arteries around heart) Father    Social History   Tobacco Use  Smoking Status Never  Smokeless Tobacco Never  Marital status: Married  Tobacco Use  Smoking status: Never  Smokeless tobacco: Never  Vaping Use  Vaping status: Never Used  Substance and Sexual Activity  Alcohol use: Not Currently  Drug use: Never    Objective:   Physical Exam Vitals reviewed.  Constitutional:  Appearance: Normal appearance.  Chest:  Breasts: Right: No inverted nipple, mass or nipple discharge.  Left: No inverted nipple, mass or nipple discharge.  Lymphadenopathy:  Upper Body:  Right upper body: No supraclavicular or axillary adenopathy.  Left upper body: No supraclavicular or axillary adenopathy.  Neurological:  Mental Status: She is alert.    Assessment and Plan:   Malignant neoplasm of upper-outer quadrant of right breast in female, estrogen receptor positive (CMS/HHS-HCC)  Right breast seed guided lumpectomy  We discussed the staging and pathophysiology of breast cancer. We discussed all of the different options for treatment for breast cancer including surgery, chemotherapy, radiation therapy, Herceptin, and antiestrogen therapy.  We discussed a sentinel lymph node biopsy which I do not think she needs based on Choosing Wisely, SOUND and INSEMA data.  We discussed the options for treatment of the breast cancer which included lumpectomy versus a mastectomy. We discussed the performance of the lumpectomy with radioactive seed placement. We discussed a 5-10% chance of a positive margin requiring reexcision in the operating room. We  also discussed that she will likely need radiation therapy if she undergoes lumpectomy. We discussed mastectomy and the postoperative care for that as well. Mastectomy can be followed by reconstruction. The decision for lumpectomy vs mastectomy has no impact on decision for  chemotherapy. Most mastectomy patients will not need radiation therapy. We discussed that there is no difference in her survival whether she undergoes lumpectomy with radiation therapy or antiestrogen therapy versus a mastectomy. There is also no real difference between her recurrence in the breast.  We discussed the risks of operation including bleeding, infection, possible reoperation. She understands her further therapy will be based on what her stages at the time of her operation.

## 2023-08-14 NOTE — Anesthesia Postprocedure Evaluation (Signed)
Anesthesia Post Note  Patient: Debra Atkinson  Procedure(s) Performed: RIGHT BREAST SEED GUIDED LUMPECTOMY (Right: Breast)     Patient location during evaluation: PACU Anesthesia Type: General Level of consciousness: awake and alert Pain management: pain level controlled Vital Signs Assessment: post-procedure vital signs reviewed and stable Respiratory status: spontaneous breathing, nonlabored ventilation, respiratory function stable and patient connected to nasal cannula oxygen Cardiovascular status: blood pressure returned to baseline and stable Postop Assessment: no apparent nausea or vomiting Anesthetic complications: no  No notable events documented.  Last Vitals:  Vitals:   08/14/23 1000 08/14/23 1040  BP: (!) 150/71 (!) 149/75  Pulse: 62 70  Resp: 14 20  Temp:  36.4 C  SpO2: 95% 95%    Last Pain:  Vitals:   08/14/23 1040  TempSrc: Temporal  PainSc: 3                  Trevor Iha

## 2023-08-14 NOTE — Transfer of Care (Signed)
Immediate Anesthesia Transfer of Care Note  Patient: Debra Atkinson  Procedure(s) Performed: RIGHT BREAST SEED GUIDED LUMPECTOMY (Right: Breast)  Patient Location: PACU  Anesthesia Type:General  Level of Consciousness: drowsy  Airway & Oxygen Therapy: Patient Spontanous Breathing and Patient connected to face mask oxygen  Post-op Assessment: Report given to RN and Post -op Vital signs reviewed and stable  Post vital signs: Reviewed and stable  Last Vitals:  Vitals Value Taken Time  BP 143/80 08/14/23 0931  Temp 36.2 C 08/14/23 0930  Pulse 67 08/14/23 0935  Resp 13 08/14/23 0935  SpO2 98 % 08/14/23 0935  Vitals shown include unfiled device data.  Last Pain:  Vitals:   08/14/23 0702  TempSrc: Temporal  PainSc: 0-No pain      Patients Stated Pain Goal: 1 (08/14/23 1610)  Complications: No notable events documented.

## 2023-08-14 NOTE — Anesthesia Procedure Notes (Signed)
Procedure Name: LMA Insertion Date/Time: 08/14/2023 8:36 AM  Performed by: Roosvelt Harps, CRNAPre-anesthesia Checklist: Patient identified, Emergency Drugs available, Suction available and Patient being monitored Patient Re-evaluated:Patient Re-evaluated prior to induction Oxygen Delivery Method: Circle System Utilized Preoxygenation: Pre-oxygenation with 100% oxygen Induction Type: IV induction Ventilation: Mask ventilation without difficulty LMA: LMA inserted LMA Size: 4.0 Number of attempts: 1 Airway Equipment and Method: Bite block Placement Confirmation: positive ETCO2 and breath sounds checked- equal and bilateral Tube secured with: Tape Dental Injury: Teeth and Oropharynx as per pre-operative assessment

## 2023-08-15 ENCOUNTER — Encounter (HOSPITAL_BASED_OUTPATIENT_CLINIC_OR_DEPARTMENT_OTHER): Payer: Self-pay | Admitting: General Surgery

## 2023-08-15 ENCOUNTER — Ambulatory Visit: Payer: Medicare HMO | Attending: Internal Medicine

## 2023-08-15 DIAGNOSIS — R002 Palpitations: Secondary | ICD-10-CM

## 2023-08-15 DIAGNOSIS — C50911 Malignant neoplasm of unspecified site of right female breast: Secondary | ICD-10-CM | POA: Diagnosis not present

## 2023-08-16 ENCOUNTER — Encounter: Payer: Self-pay | Admitting: Internal Medicine

## 2023-08-16 LAB — SURGICAL PATHOLOGY

## 2023-08-20 ENCOUNTER — Encounter: Payer: Self-pay | Admitting: *Deleted

## 2023-08-20 ENCOUNTER — Telehealth: Payer: Self-pay | Admitting: Hematology

## 2023-08-20 ENCOUNTER — Ambulatory Visit: Payer: Self-pay | Admitting: Internal Medicine

## 2023-08-20 MED ORDER — CLONAZEPAM 0.5 MG PO TABS: 0.5000 mg | ORAL_TABLET | Freq: Two times a day (BID) | ORAL | 1 refills | Status: DC | PRN

## 2023-08-20 MED ORDER — CLONAZEPAM 0.5 MG PO TABS
0.5000 mg | ORAL_TABLET | Freq: Two times a day (BID) | ORAL | 1 refills | Status: DC | PRN
Start: 2023-08-20 — End: 2023-08-20

## 2023-08-20 NOTE — Telephone Encounter (Signed)
 Patient is aware of scheduled appointment times/dates

## 2023-08-20 NOTE — Telephone Encounter (Signed)
Chief Complaint: nervousness Symptoms: anxious, nervous Frequency: several weeks Pertinent Negatives: Patient denies CP, SOB Disposition: [] ED /[] Urgent Care (no appt availability in office) / [x] Appointment(In office/virtual)/ []  [facility]/ [] Home Care/ [x] Refused Recommended Disposition /[] [facility]/ []  Follow-up with PCP Additional Notes: Patient calls reporting increased anxiety and nervousness for several weeks due to life stressors. Patient is inquiring if she can have a medication prescribed for anxiety and wants to know what is safe to take. States she is also having some vertigo and wants to know if it is okay to take her meclizine. Per protocol, patient to be evaluated within 24 hours. Patient refused to schedule appt with PCP or VV, states she just wants to speak with her doctor. Care advice reviewed. Alerting PCP for review.    Copied from CRM [id number]. Topic: Clinical - Red Word Triage >> Aug 20, 2023 12:44 PM Gurney Maxin H wrote: [facility] that prompted transfer to Nurse Triage: Patient having some dizziness and anxiety. Had surgery last week for breast cancer. Phone was disconnected on agent and another call came through. Reason for Disposition  Patient sounds very upset or troubled to the triager  Answer Assessment - Initial Assessment Questions 1. CONCERN: "Did anything happen that prompted you to call today?"      Patient has a lot things occurring at home, recent surgery for breast cancer, family concerns. 2. ANXIETY SYMPTOMS: "Can you describe how you (your loved one; patient) have been feeling?" (e.g., tense, restless, panicky, anxious, keyed up, overwhelmed, sense of impending doom).      Anxious and nervousness 3. ONSET: "How long have you been feeling this way?" (e.g., hours, days, weeks)     A few weeks 4. SEVERITY: "How would you rate the level of anxiety?" (e.g., 0 - 10; or mild, moderate, severe).     Currently 5/10, but it can increase to  ten depending on what goes on. 5. FUNCTIONAL IMPAIRMENT: "How have these feelings affected your ability to do daily activities?" "Have you had more difficulty than usual doing your normal daily activities?" (e.g., getting better, same, worse; self-care, school, work, interactions)     More difficult than normal. 6. HISTORY: "Have you felt this way before?" "Have you ever been diagnosed with an anxiety problem in the past?" (e.g., generalized anxiety disorder, panic attacks, PTSD). If Yes, ask: "How was this problem treated?" (e.g., medicines, counseling, etc.)     No, not this bad. 7. RISK OF HARM - SUICIDAL IDEATION: "Do you ever have thoughts of hurting or killing yourself?" If Yes, ask:  "Do you have these feelings now?" "Do you have a plan on how you would do this?"     Denies 8. TREATMENT:  "What has been done so far to treat this anxiety?" (e.g., medicines, relaxation strategies). "What has helped?"     Read bible and pray 9. TREATMENT - THERAPIST: "Do you have a counselor or therapist? Name?"     Denies 10. POTENTIAL TRIGGERS: "Do you drink caffeinated beverages (e.g., coffee, colas, teas), and how much daily?" "Do you drink alcohol or use any drugs?" "Have you started any new medicines recently?"       Denies, no medication changes, tries to drink caffeine free coffee 11. PATIENT SUPPORT: "Who is with you now?" "Who do you live with?" "Do you have family or friends who you can talk to?"        Lives with husband 12. OTHER SYMPTOMS: "Do you have any other symptoms?" (e.g.,  feeling depressed, trouble concentrating, trouble sleeping, trouble breathing, palpitations or fast heartbeat, chest pain, sweating, nausea, or diarrhea)       Palpitations  Protocols used: Anxiety and Panic Attack-A-AH

## 2023-08-20 NOTE — Addendum Note (Signed)
Addended by: Corwin Levins on: 08/20/2023 04:05 PM   Modules accepted: Orders

## 2023-08-20 NOTE — Telephone Encounter (Signed)
Ok for klonopin bid prn - done erx

## 2023-08-27 NOTE — Assessment & Plan Note (Signed)
-  pT1bN0M0 stage IA, ER+/PR+/HER2-, G2 -Diagnosed in January 2025 -Status post right breast lumpectomy, surgical margins were negative.  Tumor 7 mm, ER 100% strong positive, PR 90% positive, HER2 negative, Ki-67 5%.  -Given her advanced age, and favorable prognosis, I did not recommend Oncotype. -I recommend adjuvant AI for 5 years.

## 2023-08-28 ENCOUNTER — Inpatient Hospital Stay: Payer: Medicare HMO | Attending: Hematology | Admitting: Hematology

## 2023-08-28 ENCOUNTER — Encounter: Payer: Self-pay | Admitting: *Deleted

## 2023-08-28 ENCOUNTER — Other Ambulatory Visit: Payer: Self-pay

## 2023-08-28 VITALS — BP 145/59 | HR 75 | Temp 98.0°F | Resp 15 | Wt 200.2 lb

## 2023-08-28 DIAGNOSIS — C50411 Malignant neoplasm of upper-outer quadrant of right female breast: Secondary | ICD-10-CM | POA: Insufficient documentation

## 2023-08-28 DIAGNOSIS — Z1721 Progesterone receptor positive status: Secondary | ICD-10-CM | POA: Diagnosis not present

## 2023-08-28 DIAGNOSIS — M8580 Other specified disorders of bone density and structure, unspecified site: Secondary | ICD-10-CM | POA: Diagnosis not present

## 2023-08-28 DIAGNOSIS — Z170 Estrogen receptor positive status [ER+]: Secondary | ICD-10-CM | POA: Insufficient documentation

## 2023-08-28 DIAGNOSIS — Z1732 Human epidermal growth factor receptor 2 negative status: Secondary | ICD-10-CM | POA: Insufficient documentation

## 2023-08-28 DIAGNOSIS — E2839 Other primary ovarian failure: Secondary | ICD-10-CM

## 2023-08-28 DIAGNOSIS — Z79899 Other long term (current) drug therapy: Secondary | ICD-10-CM | POA: Diagnosis not present

## 2023-08-28 DIAGNOSIS — Z79811 Long term (current) use of aromatase inhibitors: Secondary | ICD-10-CM | POA: Insufficient documentation

## 2023-08-28 MED ORDER — ANASTROZOLE 1 MG PO TABS: 1.0000 mg | ORAL_TABLET | Freq: Every day | ORAL | 5 refills | Status: DC

## 2023-08-28 NOTE — Progress Notes (Signed)
 [facility]   Telephone:(336) [phone] Fax:(336) [phone]   Clinic Follow up Note   Patient Care Team: Corwin Levins, MD as PCP - General (Internal Medicine) Marzella Schlein., MD as Consulting Physician (Ophthalmology) Pershing Proud, RN as Oncology Nurse Navigator Donnelly Angelica, RN as Oncology Nurse Navigator Malachy Mood, MD as Consulting Physician (Hematology)  Date of Service:  08/28/2023  CHIEF COMPLAINT: f/u of breast cancer  CURRENT THERAPY:  Pending adjuvant AI  Oncology History   Malignant neoplasm of upper-outer quadrant of right breast in female, estrogen receptor positive (HCC) -pT1bN0M0 stage IA, ER+/PR+/HER2-, G2 -Diagnosed in January 2025 -Status post right breast lumpectomy, surgical margins were negative.  Tumor 7 mm, ER 100% strong positive, PR 90% positive, HER2 negative, Ki-67 5%.  -Given her advanced age, and favorable prognosis, I did not recommend Oncotype. -I recommend adjuvant AI for 5 years.   Assessment and Plan    Invasive Ductal Carcinoma of the Right Breast 78 year old female, status post-surgical resection on August 14, 2023. Pathology: 7 mm, grade 2 tumor, ER/PR positive, KI-67 low, negative margins, no lymph node involvement. No chemotherapy or radiation therapy needed. Discussed anti-estrogen therapy (anastrozole) to reduce recurrence risk to 5%, with side effects including hot flashes, joint pain (40-50%, significant in 10%), and impact on bone density. - Start anastrozole 1 mg daily after follow-up with Dr. Dwain Sarna on September 12, 2023 - Monitor for side effects such as hot flashes and joint pain - Order bone density scan at [facility] within the next three months - Take calcium and vitamin D supplements to support bone health - Follow up in three months with nurse practitioner Wylene Men for survivorship care - Follow up in six months with oncologist  Osteopenia Confirmed by bone density scan four to five years ago. Potential for  worsening due to anti-estrogen therapy. - Order bone density scan at [facility] within the next three months - Take calcium and vitamin D supplements to support bone health - Monitor bone density every two years  General Health Maintenance Discussed the importance of a healthy diet and regular exercise to mitigate potential side effects of anti-estrogen therapy, such as weight gain, hypercholesterolemia, and hypertension. - Encourage a diet rich in vegetables and lean meats - Encourage regular physical activity  Plan -Surgical path reviewed, she is recovering well from surgery. -I called in anastrozole to her pharmacy today, she will start after her visit with Dr. Dwain Sarna in a few weeks. -I will send a message to her radiation oncologist Dr. Sharol Roussel to see if adjuvant radiation could be safely omitted. - Follow up in three months with nurse practitioner Clayborn Heron for survivorship care - Follow up in six months with me - Call [facility] to schedule bone density scan within the next three months.         SUMMARY OF ONCOLOGIC HISTORY: Oncology History  Malignant neoplasm of upper-outer quadrant of right breast in female, estrogen receptor positive (HCC)  07/12/2023 Cancer Staging   Staging form: Breast, AJCC 8th Edition - Clinical stage from 07/12/2023: Stage IA (cT1b, cN0, cM0, G1, ER+, PR+, HER2-) - Signed by Malachy Mood, MD on 07/23/2023 Stage prefix: Initial diagnosis Histologic grading system: 3 grade system   07/18/2023 Initial Diagnosis   Malignant neoplasm of upper-outer quadrant of right breast in female, estrogen receptor positive (HCC)   08/14/2023 Cancer Staging   Staging form: Breast, AJCC 8th Edition - Pathologic stage from 08/14/2023: Stage IA (pT1b, pN0, cM0, G2, ER+, PR+, HER2-) -  Signed by Malachy Mood, MD on 08/27/2023 Histologic grading system: 3 grade system Residual tumor (R): R0      Discussed the use of AI scribe software for clinical note transcription with the patient, who  gave verbal consent to proceed.  History of Present Illness   The patient, a 78 year old female with a history of breast cancer, presents for a follow-up visit after her recent surgery. She reports that the surgery went well and she is healing without any complications. She has been managing any post-operative pain with over-the-counter medications such as ibuprofen and Tylenol, and has not needed any stronger pain medication. She denies any issues with her shoulder or swelling in the surgical area.  The patient also mentions that she has been wearing a sports bra since the surgery, which she finds more comfortable than the post-surgical bra. She has not experienced any significant discomfort or pain, and she has been able to resume her normal activities, including sweeping, vacuuming, and mopping.  The patient also has a history of vertigo, for which she has been prescribed meclizine. However, she reports that she does not take it regularly as it does not seem to help with her symptoms. She also has a history of osteopenia and is currently taking a multivitamin with minerals, vitamin D3, and PreserVision for macular degeneration.         All other systems were reviewed with the patient and are negative.  MEDICAL HISTORY:  Past Medical History:  Diagnosis Date   Anxiety    Cancer (HCC) 07/2023   right breast IDC   Cerebrovascular disease 02/05/2019   Concussion '06, '11   Family history of colon cancer 02/28/2016   Fibromyalgia 10/08/2013   Hyperlipidemia    diet managed   Macular degeneration disease    Pre-diabetes    Varicella    Vertigo     SURGICAL HISTORY: Past Surgical History:  Procedure Laterality Date   APPENDECTOMY  07/02/1977   BREAST LUMPECTOMY WITH RADIOACTIVE SEED LOCALIZATION Right 08/14/2023   Procedure: RIGHT BREAST SEED GUIDED LUMPECTOMY;  Surgeon: Emelia Loron, MD;  Location: [facility];  Service: General;  Laterality: Right;   CESAREAN  SECTION  07/02/1976   CHOLECYSTECTOMY  07/02/1977   laparotomy   COLONOSCOPY      I have reviewed the social history and family history with the patient and they are unchanged from previous note.  ALLERGIES:  is allergic to codeine and methylprednisolone.  MEDICATIONS:  Current Outpatient Medications  Medication Sig Dispense Refill   anastrozole (ARIMIDEX) 1 MG tablet Take 1 tablet (1 mg total) by mouth daily. 30 tablet 5   cetirizine (ZYRTEC) 10 MG tablet Take 10 mg by mouth daily.     Cholecalciferol ([employer] D3) 50 MCG (2000 UT) CAPS Take by mouth.      clonazePAM (KLONOPIN) 0.5 MG tablet Take 1 tablet (0.5 mg total) by mouth 2 (two) times daily as needed for anxiety. 20 tablet 1   meclizine (ANTIVERT) 25 MG tablet Take 25 mg by mouth 3 (three) times daily as needed for dizziness.     Multiple Vitamins-Minerals (MULTIVITAMIN WITH MINERALS) tablet Take 1 tablet by mouth daily.     Multiple Vitamins-Minerals (PRESERVISION AREDS 2 PO) Take by mouth.     nystatin cream (MYCOSTATIN) Apply 1 Application topically 2 (two) times daily. 60 g 0   rosuvastatin (CRESTOR) 40 MG tablet Take 1 tablet by mouth once daily 90 tablet 3   triamcinolone (NASACORT) 55 MCG/ACT AERO nasal  inhaler Place 2 sprays into the nose daily. 1 each 2   No current facility-administered medications for this visit.    PHYSICAL EXAMINATION: ECOG PERFORMANCE STATUS: 0 - Asymptomatic  Vitals:   08/28/23 0814  BP: (!) 145/59  Pulse: 75  Resp: 15  Temp: 98 F (36.7 C)  SpO2: 96%   Wt Readings from Last 3 Encounters:  08/28/23 200 lb 3.2 oz (90.8 kg)  08/14/23 199 lb 4.7 oz (90.4 kg)  08/12/23 197 lb (89.4 kg)     GENERAL:alert, no distress and comfortable SKIN: skin color, texture, turgor are normal, no rashes or significant lesions EYES: normal, Conjunctiva are pink and non-injected, sclera clear NECK: supple, thyroid normal size, non-tender, without nodularity LYMPH:  no palpable lymphadenopathy in the  cervical, axillary  LUNGS: clear to auscultation and percussion with normal breathing effort HEART: regular rate & rhythm and no murmurs and no lower extremity edema ABDOMEN:abdomen soft, non-tender and normal bowel sounds Musculoskeletal:no cyanosis of digits and no clubbing  NEURO: alert & oriented x 3 with fluent speech, no focal motor/sensory deficits BREAST: Breasts appear normal post-surgery with slight hardness at the incision in right breast.     LABORATORY DATA:  I have reviewed the data as listed    Latest Ref Rng & Units 07/22/2023   11:47 AM 07/04/2022   10:47 AM 01/16/2022   11:38 AM  CBC  WBC 4.0 - 10.5 K/uL 6.5  5.9  6.6   Hemoglobin 12.0 - 15.0 g/dL 40.9  81.1  91.4   Hematocrit 36.0 - 46.0 % 42.7  39.6  38.7   Platelets 150.0 - 400.0 K/uL 324.0  289.0  314.0         Latest Ref Rng & Units 07/22/2023   11:47 AM 01/11/2023   11:44 AM 07/04/2022   10:47 AM  CMP  Glucose 70 - 99 mg/dL 782  99  956   BUN 6 - 23 mg/dL 13  20  16    Creatinine 0.40 - 1.20 mg/dL 2.13  0.86  5.78   Sodium 135 - 145 mEq/L 141  139  141   Potassium 3.5 - 5.1 mEq/L 3.9  4.2  4.1   Chloride 96 - 112 mEq/L 104  102  103   CO2 19 - 32 mEq/L 30  31  29    Calcium 8.4 - 10.5 mg/dL 9.6  46.9  9.8   Total Protein 6.0 - 8.3 g/dL 7.5  7.3  7.1   Total Bilirubin 0.2 - 1.2 mg/dL 0.5  0.4  0.6   Alkaline Phos 39 - 117 U/L 89  82  78   AST 0 - 37 U/L 20  20  19    ALT 0 - 35 U/L 25  24  20        RADIOGRAPHIC STUDIES: I have personally reviewed the radiological images as listed and agreed with the findings in the report. No results found.    Orders Placed This Encounter  Procedures   DG Bone Density    Standing Status:   Future    Expected Date:   09/25/2023    Expiration Date:   08/27/2024    Scheduling Instructions:     [facility]    Reason for Exam (SYMPTOM  OR DIAGNOSIS REQUIRED):   SCREENING    Preferred imaging location?:   External   All questions were answered. The patient knows to call  the clinic with any problems, questions or concerns. No barriers to learning was detected. The  total time spent in the appointment was 30 minutes.     Malachy Mood, MD 08/28/2023

## 2023-09-02 ENCOUNTER — Telehealth: Payer: Self-pay

## 2023-09-02 ENCOUNTER — Encounter: Payer: Self-pay | Admitting: General Practice

## 2023-09-02 ENCOUNTER — Encounter

## 2023-09-02 NOTE — Progress Notes (Signed)
 [facility] Multidisciplinary Clinic Spiritual Care Note  Met with Debra Atkinson for follow-up Breast Multidisciplinary Clinic check-in to review Support Center team/resources.  She completed SDOH screening; results follow below.  SDOH Interventions    Flowsheet Row Clinical Support from 12/24/2022 in [facility] Clinical Support from 12/18/2021 in [facility] [place]  SDOH Interventions    Food Insecurity Interventions Intervention Not Indicated Intervention Not Indicated  Housing Interventions Intervention Not Indicated Intervention Not Indicated  Transportation Interventions Intervention Not Indicated Intervention Not Indicated  Utilities Interventions Intervention Not Indicated --  Alcohol Usage Interventions Intervention Not Indicated (Score <7) --  Depression Interventions/Treatment  PHQ2-9 Score <4 Follow-up Not Indicated --  Financial Strain Interventions Intervention Not Indicated Intervention Not Indicated  Physical Activity Interventions Intervention Not Indicated Intervention Not Indicated  Stress Interventions Intervention Not Indicated --  Social Connections Interventions Intervention Not Indicated Intervention Not Indicated       SDOH Screenings   Food Insecurity: No Food Insecurity (09/02/2023)  Housing: Low Risk  (09/02/2023)  Transportation Needs: No Transportation Needs (09/02/2023)  Utilities: Not At Risk (09/02/2023)  Alcohol Screen: Low Risk  (12/24/2022)  Depression (PHQ2-9): Low Risk  (07/22/2023)  Financial Resource Strain: Low Risk  (12/24/2022)  Physical Activity: Insufficiently Active (12/24/2022)  Social Connections: Moderately Integrated (12/24/2022)  Stress: No Stress Concern Present (12/24/2022)  Tobacco Use: Low Risk  (08/14/2023)    Chaplain and patient discussed common feelings and emotions when being diagnosed with cancer, and the importance of support during treatment.  Chaplain informed patient of the support  team and support services at [facility].  Chaplain provided contact information and encouraged patient to call with any questions or concerns.  Follow up needed: No. Placed RD referral for help with Debra Atkinson's questions about how to follow a low-estrogen diet. She is aware of ongoing Spiritual Care availability and plans to phone as needed/desired.   [address] [place], [place], [facility] Pager [phone] Voicemail [phone]

## 2023-09-02 NOTE — Telephone Encounter (Signed)
 Contacted patient via telephone call, per Dr. Mosetta Putt. Let patient know hat Dr. Roselind Messier has reviewed her surgical path and feels comfortable for her to omit radiation. Patient verbalized understanding. Patient had a follow-up question regarding her diet restrictions.  Patient stated she was advised to eat estrogen free foods and she is not sure what that means.  Patient stated she tried to google what she should and should not eat but it was not clear to her.  Patient requesting a list of foods that she should as well as should not eat.

## 2023-09-02 NOTE — Progress Notes (Signed)
 Nutrition Assessment:  Referral received from [place], [place]. Patient concerned about foods that contain estrogen and does not know what to eat.    78 year old patient with right breast cancer. S/p lumpectomy, ER/PR positive.  Planning AI for 5 years.    Spoke with patient via phone to address nutrition questions and concerns.     Medications: reviewed  Labs: reviewed  Anthropometrics:   Height: 62 inches Weight: 200 lb 3.2 oz BMI: 36   NUTRITION DIAGNOSIS: Food and nutrition related knowledge deficit as evidenced by new diagnosis of breast cancer and concerns regarding foods to eat    INTERVENTION:  Reviewed AICR guidelines for Breast Cancer Survivors.  Handout will be mailed.  Patient appreciative of phone call.   Contact number will be mailed.      NEXT VISIT: no follow-up  Chevi Lim B. Freida Busman, RD, LDN Registered Dietitian [phone]

## 2023-09-03 ENCOUNTER — Other Ambulatory Visit: Payer: Self-pay

## 2023-09-03 ENCOUNTER — Telehealth: Payer: Self-pay

## 2023-09-03 NOTE — Telephone Encounter (Signed)
 Reached out to patient via telephone call. Let patient know that per, Dr. Mosetta Putt there are no foods that she should avoid. Patient verbalized understanding.

## 2023-09-05 ENCOUNTER — Telehealth: Payer: Self-pay

## 2023-09-05 NOTE — Telephone Encounter (Signed)
 Copied from CRM [id number]. Topic: Clinical - Medication Question >> Sep 05, 2023 12:40 PM Debra Atkinson wrote: Reason for CRM: Patient called in stated she was told to start calcium and would like Dr. Jonny Ruiz input about that and if so wat kind of calcium to get. Please call [phone]

## 2023-09-06 NOTE — Telephone Encounter (Signed)
 The ideal thing would be calcium carbonate at 500 mg three times per day with meals,  An example would be oscal plus D which is OTC    thanks

## 2023-09-06 NOTE — Telephone Encounter (Signed)
 Called and let Pt know

## 2023-09-18 ENCOUNTER — Telehealth: Payer: Self-pay

## 2023-09-18 NOTE — Telephone Encounter (Signed)
 Called and let Pt know

## 2023-09-18 NOTE — Telephone Encounter (Signed)
 That should be fine as these even together is not too much, and her level last time was in the low normal range (not close to being high).   thanks

## 2023-09-18 NOTE — Telephone Encounter (Signed)
 Copied from CRM [id number]. Topic: Clinical - Medication Question >> Sep 18, 2023 10:01 AM Kathryne Eriksson wrote: Reason for CRM: Medication Question >> Sep 18, 2023 10:04 AM Kathryne Eriksson wrote: Patient is wanting to know if she's supposed to be taking the Vitamin D3 along with the Calcium. Patient states the Calcium has about 30% D3 included in it already. Patient call back number is [phone]

## 2023-09-23 ENCOUNTER — Other Ambulatory Visit: Payer: Self-pay

## 2023-09-23 ENCOUNTER — Telehealth: Payer: Self-pay | Admitting: Internal Medicine

## 2023-09-23 NOTE — Telephone Encounter (Signed)
 Copied from CRM [id number]. Topic: Clinical - Medication Question >> Sep 23, 2023 12:00 PM Debra Atkinson wrote: Reason for CRM: Patient says she stopped taking her Multiple Vitamins-Minerals (MULTIVITAMIN WITH MINERALS) tablet because she is taking so many other medications, she is not sure whether or not to continue taking it. Please follow up to advise.

## 2023-09-23 NOTE — Telephone Encounter (Signed)
 Sure, ok to hold off on the MVI for now, may want to restart in a few months

## 2023-09-27 DIAGNOSIS — Z853 Personal history of malignant neoplasm of breast: Secondary | ICD-10-CM | POA: Diagnosis not present

## 2023-09-27 DIAGNOSIS — M8588 Other specified disorders of bone density and structure, other site: Secondary | ICD-10-CM | POA: Diagnosis not present

## 2023-09-27 DIAGNOSIS — R29890 Loss of height: Secondary | ICD-10-CM | POA: Diagnosis not present

## 2023-09-27 LAB — HM DEXA SCAN

## 2023-09-30 ENCOUNTER — Encounter: Payer: Self-pay | Admitting: Internal Medicine

## 2023-10-31 ENCOUNTER — Telehealth: Payer: Self-pay | Admitting: Internal Medicine

## 2023-10-31 NOTE — Telephone Encounter (Signed)
 Copied from CRM [id number]. Topic: Clinical - Medication Question >> Oct 31, 2023  3:28 PM Freya Jesus wrote: Reason for CRM: Patient needs assistance choosing the right vitamin D  (per provider requested to take vitamin D  500mg  3x a day) and the pharmacist said that calcium  is the same as vitamin D . So, he/she advised to take the 500mg  Calcium  that has vitamin D3. Patient is requesting a call back for clarity.

## 2023-10-31 NOTE — Telephone Encounter (Signed)
 Ok that would be fine, such as Oscal plus D three times per day with meals.    If she did this, she would not need other Vit D3

## 2023-11-05 DIAGNOSIS — H5201 Hypermetropia, right eye: Secondary | ICD-10-CM | POA: Diagnosis not present

## 2023-11-05 DIAGNOSIS — H524 Presbyopia: Secondary | ICD-10-CM | POA: Diagnosis not present

## 2023-11-05 DIAGNOSIS — H353132 Nonexudative age-related macular degeneration, bilateral, intermediate dry stage: Secondary | ICD-10-CM | POA: Diagnosis not present

## 2023-11-05 DIAGNOSIS — H52223 Regular astigmatism, bilateral: Secondary | ICD-10-CM | POA: Diagnosis not present

## 2023-11-05 DIAGNOSIS — H26493 Other secondary cataract, bilateral: Secondary | ICD-10-CM | POA: Diagnosis not present

## 2023-11-12 ENCOUNTER — Ambulatory Visit: Payer: Self-pay

## 2023-11-12 NOTE — Telephone Encounter (Signed)
 Pt would like to know if she can take vitamin C and zinc with her current medication regiment. Denies s/s of covid, husband was positive for covid. Please call pt back and advise.   Copied from CRM [id number]. Topic: Clinical - Medical Advice >> Nov 12, 2023  8:53 AM Crispin Dolphin wrote: Reason for CRM: Patient called stated her husband tested positive for Covid. Patient currently not having symptoms but was told she needed to take vit C and zinc. Would like to know if she should do that. Thank You Reason for Disposition  [1] COVID-19 EXPOSURE within last 14 days AND [2] NO symptoms  Protocols used: Coronavirus (COVID-19) Exposure-A-AH

## 2023-11-18 NOTE — Telephone Encounter (Signed)
 Called and let Pt know , Pt states understanding no further questions.

## 2023-11-18 NOTE — Telephone Encounter (Signed)
 Oh yes, both of these would be fine.   Thanks

## 2023-11-18 NOTE — Progress Notes (Signed)
 CLINIC:  Survivorship   Patient Care Team: Debra Coombe, MD as PCP - General (Internal Medicine) Debra [facility]., MD as Consulting Physician (Ophthalmology) Debra Bo, RN as Oncology Nurse Navigator Debra Hsu, RN as Oncology Nurse Navigator Debra [place], MD as Consulting Physician (Hematology) Debra Brame K, NP as Nurse Practitioner (Nurse Practitioner)   REASON FOR VISIT:  Routine follow-up post-treatment for a recent history of breast cancer.   BRIEF ONCOLOGIC HISTORY:  Oncology History  Malignant neoplasm of upper-outer quadrant of right breast in female, estrogen receptor positive (HCC)  07/12/2023 Cancer Staging   Staging form: Breast, AJCC 8th Edition - Clinical stage from 07/12/2023: Stage IA (cT1b, cN0, cM0, G1, ER+, PR+, HER2-) - Signed by Debra [place], MD on 07/23/2023 Stage prefix: Initial diagnosis Histologic grading system: 3 grade system   07/18/2023 Initial Diagnosis   Malignant neoplasm of upper-outer quadrant of right breast in female, estrogen receptor positive (HCC)   08/14/2023 Cancer Staging   Staging form: Breast, AJCC 8th Edition - Pathologic stage from 08/14/2023: Stage IA (pT1b, pN0, cM0, G2, ER+, PR+, HER2-) - Signed by Debra [place], MD on 08/27/2023 Histologic grading system: 3 grade system Residual tumor (R): R0     INTERVAL HISTORY:  Debra [facility] presents to the Survivorship Clinic today for our initial meeting to review her survivorship care plan detailing her treatment course for breast cancer, as well as monitoring long-term side effects of that treatment, education regarding health maintenance, screening, and overall wellness and health promotion.     Overall, Debra [facility] is doing very well. Tolerating anastrozole  with rare hot flashes. She remains very active, mowing at least 5 lawns weekly. The lawn mower requires her to shift gears which she thinks is causing R shoulder and pectoralis muscle pain. Walked a mile with great grandchildren  recently and had some left knee and R hip aches. Denies breast concerns, feels that she has healed nicely.     REVIEW OF SYSTEMS:  Review of Systems - Oncology Breast: Denies any new nodularity, masses, tenderness, nipple changes, or nipple discharge.      ONCOLOGY TREATMENT TEAM:  1. Surgeon:  Dr. Delane [facility] at [facility] 2. Medical Oncologist: Dr. Maryalice [facility]     PAST MEDICAL/SURGICAL HISTORY:  Past Medical History:  Diagnosis Date   Anxiety    Cancer (HCC) 07/2023   right breast IDC   Cerebrovascular disease 02/05/2019   Concussion '06, '11   Family history of colon cancer 02/28/2016   Fibromyalgia 10/08/2013   Hyperlipidemia    diet managed   Macular degeneration disease    Pre-diabetes    Varicella    Vertigo    Past Surgical History:  Procedure Laterality Date   APPENDECTOMY  07/02/1977   BREAST LUMPECTOMY WITH RADIOACTIVE SEED LOCALIZATION Right 08/14/2023   Procedure: RIGHT BREAST SEED GUIDED LUMPECTOMY;  Surgeon: Debra Harry, MD;  Location: [facility];  Service: General;  Laterality: Right;   CESAREAN SECTION  07/02/1976   CHOLECYSTECTOMY  07/02/1977   laparotomy   COLONOSCOPY       ALLERGIES:  Allergies  Allergen Reactions   Codeine  Other (See Comments)    "I don't remember, it's been so long ago"   Methylprednisolone  Itching    With shots only     CURRENT MEDICATIONS:  Outpatient Encounter Medications as of 11/19/2023  Medication Sig   anastrozole  (ARIMIDEX ) 1 MG tablet Take 1 tablet (1 mg total) by mouth daily.   cetirizine  (ZYRTEC ) 10 MG  tablet Take 10 mg by mouth daily.   Cholecalciferol ([employer] D3) 50 MCG (2000 UT) CAPS Take by mouth.    Multiple Vitamins-Minerals (PRESERVISION AREDS 2 PO) Take by mouth.   nystatin  cream (MYCOSTATIN ) Apply 1 Application topically 2 (two) times daily.   rosuvastatin  (CRESTOR ) 40 MG tablet Take 1 tablet by mouth once daily   clonazePAM  (KLONOPIN ) 0.5 MG tablet Take 1 tablet (0.5 mg  total) by mouth 2 (two) times daily as needed for anxiety. (Patient not taking: Reported on 11/19/2023)   meclizine  (ANTIVERT ) 25 MG tablet Take 25 mg by mouth 3 (three) times daily as needed for dizziness. (Patient not taking: Reported on 11/19/2023)   Multiple Vitamins-Minerals (MULTIVITAMIN WITH MINERALS) tablet Take 1 tablet by mouth daily. (Patient not taking: Reported on 11/19/2023)   triamcinolone  (NASACORT ) 55 MCG/ACT AERO nasal inhaler Place 2 sprays into the nose daily. (Patient not taking: Reported on 11/19/2023)   No facility-administered encounter medications on file as of 11/19/2023.     ONCOLOGIC FAMILY HISTORY:  Family History  Problem Relation Age of Onset   Diabetes Mother    Heart disease Mother        CHF   Cancer Mother 52       colon cancer and breast cancer   COPD Mother    Heart disease Father        CAD/MI   Rheumatic fever Father    Cancer Paternal Aunt        breast and other cancer   Diabetes Maternal Grandfather      GENETIC COUNSELING/TESTING: Yes, results not available to me  SOCIAL HISTORY:  Debra [facility] is married and lives with her spouse in [place], [place] .  She has 3 children and grand/great grand children.  Debra [facility] is currently not working but she remains very active in her yard and mows the lawns of others.  She denies any current or history of tobacco, alcohol, or illicit drug use.     PHYSICAL EXAMINATION:  Vital Signs:   Vitals:   11/19/23 0955  BP: 136/69  Pulse: 70  Resp: 20  Temp: 97.7 F (36.5 C)  SpO2: 98%   Filed Weights   11/19/23 0955  Weight: 185 lb 3.2 oz (84 kg)   General: Well-nourished, well-appearing female in no acute distress.  HEENT: Sclerae anicteric.  Respiratory: breathing non-labored.  Neuro: No focal deficits. Steady gait.  Psych: Mood and affect normal and appropriate for situation.  MSK: Full range of motion in bilateral upper extremities Breast: visual inspection reveals a well  healed incision to the right upper outer breast  LABORATORY DATA:  None for this visit.  DIAGNOSTIC IMAGING:  None for this visit.      ASSESSMENT AND PLAN:  Ms.. Atkinson is a pleasant 78 y.o. 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  She presents to the Survivorship Clinic for our initial meeting and routine follow-up post-completion of treatment for breast cancer.    Stage I right breast cancer:  Debra [facility]kinson has recovered well from definitive treatment for breast cancer. She will follow-up with her medical oncologist, Dr. Maryalice Atkinson in 01/2024 with history and physical exam per surveillance protocol.  She will continue her anti-estrogen therapy with anastrozole . Thus far, she is tolerating well, with minimal side effects including rare hot flashes and scattered joint aches which could be from other activities. She was instructed to  make Dr. Maryalice [facility] or myself aware if she begins to experience any worsening side effects of the medication and I could see her back in clinic to help manage those side effects, as needed.  Today, a comprehensive survivorship care plan and treatment summary was reviewed with the patient today detailing her breast cancer diagnosis, treatment course, potential late/long-term effects of treatment, appropriate follow-up care with recommendations for the future, and patient education resources.  A copy of this summary, along with a letter will be sent to the patient's primary care provider via mail/fax/In Basket message after today's visit.    Bone health:  Given Debra [facility]'s age/history of breast cancer and her current treatment regimen including anti-estrogen therapy with anastrozole , she is at risk for bone demineralization.  Her last DEXA scan was 08/2023, which showed mild osteopenia.  She has started vitamin D  and engages in frequent  weightbearing exercise.    Cancer screening:  Due to Debra [facility]'s history and her age and overall good health and performance status, she continues screening for skin cancers, colon cancer, and gynecologic cancers.  The information and recommendations are listed on the patient's comprehensive care plan/treatment summary and were reviewed in detail with the patient.    Health maintenance and wellness promotion: Debra [facility] was encouraged to consume 5-7 servings of fruits and vegetables per day. She was also encouraged to engage in moderate to vigorous exercise for 30 minutes per day most days of the week. She was instructed to limit her alcohol consumption and continue to abstain from tobacco use.     Support services/counseling: It is not uncommon for this period of the patient's cancer care trajectory to be one of many emotions and stressors.  We discussed an opportunity for her to participate in the next session of FYNN ("Finding Your New Normal") support group series designed for patients after they have completed treatment.   Ms. Jemmott was encouraged to take advantage of our many other support services programs, support groups, and/or counseling in coping with her new life as a cancer survivor after completing anti-cancer treatment.  She was offered support today through active listening and expressive supportive counseling.  She was given information regarding our available services and encouraged to contact me with any questions or for help enrolling in any of our support group/programs.    Dispo:   -Return to cancer [facility] 02/20/24 as scheduled -Mammogram due in 06/2024 -Follow up with surgery as indicated -She is welcome to return back to the Survivorship Clinic at any time; no additional follow-up needed at this time.  -Consider referral back to survivorship as a long-term survivor for continued surveillance  A total of (30) minutes of face-to-face time was spent with this patient with  greater than 50% of that time in counseling and care-coordination.   Shaylie Eklund, NP Survivorship Program [facility] [phone]   Note: PRIMARY CARE PROVIDER Debra Coombe, MD [phone]

## 2023-11-19 ENCOUNTER — Inpatient Hospital Stay: Payer: Medicare HMO | Attending: Hematology | Admitting: Nurse Practitioner

## 2023-11-19 ENCOUNTER — Encounter: Payer: Self-pay | Admitting: Nurse Practitioner

## 2023-11-19 VITALS — BP 136/69 | HR 70 | Temp 97.7°F | Resp 20 | Wt 185.2 lb

## 2023-11-19 DIAGNOSIS — Z170 Estrogen receptor positive status [ER+]: Secondary | ICD-10-CM | POA: Insufficient documentation

## 2023-11-19 DIAGNOSIS — Z79811 Long term (current) use of aromatase inhibitors: Secondary | ICD-10-CM | POA: Diagnosis not present

## 2023-11-19 DIAGNOSIS — C50411 Malignant neoplasm of upper-outer quadrant of right female breast: Secondary | ICD-10-CM | POA: Diagnosis not present

## 2023-11-19 DIAGNOSIS — Z1732 Human epidermal growth factor receptor 2 negative status: Secondary | ICD-10-CM | POA: Insufficient documentation

## 2023-11-19 DIAGNOSIS — Z1721 Progesterone receptor positive status: Secondary | ICD-10-CM | POA: Diagnosis not present

## 2023-12-02 ENCOUNTER — Other Ambulatory Visit: Payer: Self-pay | Admitting: Internal Medicine

## 2023-12-02 ENCOUNTER — Other Ambulatory Visit: Payer: Self-pay

## 2023-12-20 ENCOUNTER — Ambulatory Visit: Payer: Self-pay | Admitting: Internal Medicine

## 2023-12-20 ENCOUNTER — Ambulatory Visit: Payer: Medicare HMO | Admitting: Internal Medicine

## 2023-12-20 VITALS — HR 67 | Temp 98.2°F | Ht 62.0 in | Wt 181.1 lb

## 2023-12-20 DIAGNOSIS — E538 Deficiency of other specified B group vitamins: Secondary | ICD-10-CM | POA: Diagnosis not present

## 2023-12-20 DIAGNOSIS — E7800 Pure hypercholesterolemia, unspecified: Secondary | ICD-10-CM | POA: Diagnosis not present

## 2023-12-20 DIAGNOSIS — R5383 Other fatigue: Secondary | ICD-10-CM

## 2023-12-20 DIAGNOSIS — E559 Vitamin D deficiency, unspecified: Secondary | ICD-10-CM

## 2023-12-20 DIAGNOSIS — R7303 Prediabetes: Secondary | ICD-10-CM | POA: Diagnosis not present

## 2023-12-20 LAB — URINALYSIS, ROUTINE W REFLEX MICROSCOPIC
Bilirubin Urine: NEGATIVE
Hgb urine dipstick: NEGATIVE
Ketones, ur: NEGATIVE
Leukocytes,Ua: NEGATIVE
Nitrite: NEGATIVE
Specific Gravity, Urine: 1.01 (ref 1.000–1.030)
Total Protein, Urine: NEGATIVE
Urine Glucose: NEGATIVE
Urobilinogen, UA: 0.2 (ref 0.0–1.0)
pH: 7 (ref 5.0–8.0)

## 2023-12-20 LAB — CBC WITH DIFFERENTIAL/PLATELET
Basophils Absolute: 0 10*3/uL (ref 0.0–0.1)
Basophils Relative: 0.6 % (ref 0.0–3.0)
Eosinophils Absolute: 0.1 10*3/uL (ref 0.0–0.7)
Eosinophils Relative: 1.2 % (ref 0.0–5.0)
HCT: 40.2 % (ref 36.0–46.0)
Hemoglobin: 13.1 g/dL (ref 12.0–15.0)
Lymphocytes Relative: 39.6 % (ref 12.0–46.0)
Lymphs Abs: 2.2 10*3/uL (ref 0.7–4.0)
MCHC: 32.5 g/dL (ref 30.0–36.0)
MCV: 82.3 fl (ref 78.0–100.0)
Monocytes Absolute: 0.6 10*3/uL (ref 0.1–1.0)
Monocytes Relative: 9.9 % (ref 3.0–12.0)
Neutro Abs: 2.7 10*3/uL (ref 1.4–7.7)
Neutrophils Relative %: 48.7 % (ref 43.0–77.0)
Platelets: 281 10*3/uL (ref 150.0–400.0)
RBC: 4.88 Mil/uL (ref 3.87–5.11)
RDW: 13.8 % (ref 11.5–15.5)
WBC: 5.6 10*3/uL (ref 4.0–10.5)

## 2023-12-20 LAB — BASIC METABOLIC PANEL WITH GFR
BUN: 16 mg/dL (ref 6–23)
CO2: 31 meq/L (ref 19–32)
Calcium: 9.7 mg/dL (ref 8.4–10.5)
Chloride: 104 meq/L (ref 96–112)
Creatinine, Ser: 0.73 mg/dL (ref 0.40–1.20)
GFR: 79.1 mL/min (ref >60.00)
Glucose, Bld: 109 mg/dL — ABNORMAL HIGH (ref 70–99)
Potassium: 4.7 meq/L (ref 3.5–5.1)
Sodium: 142 meq/L (ref 135–145)

## 2023-12-20 LAB — LIPID PANEL
Cholesterol: 130 mg/dL (ref 0–200)
HDL: 50.3 mg/dL (ref >39.00)
LDL Cholesterol: 61 mg/dL (ref 0–99)
NonHDL: 79.23
Total CHOL/HDL Ratio: 3
Triglycerides: 92 mg/dL (ref 0.0–149.0)
VLDL: 18.4 mg/dL (ref 0.0–40.0)

## 2023-12-20 LAB — HEPATIC FUNCTION PANEL
ALT: 19 U/L (ref 0–35)
AST: 18 U/L (ref 0–37)
Albumin: 4.3 g/dL (ref 3.5–5.2)
Alkaline Phosphatase: 73 U/L (ref 39–117)
Bilirubin, Direct: 0.1 mg/dL (ref 0.0–0.3)
Total Bilirubin: 0.5 mg/dL (ref 0.2–1.2)
Total Protein: 7.2 g/dL (ref 6.0–8.3)

## 2023-12-20 LAB — VITAMIN D 25 HYDROXY (VIT D DEFICIENCY, FRACTURES): VITD: 39.92 ng/mL (ref 30.00–100.00)

## 2023-12-20 LAB — SEDIMENTATION RATE: Sed Rate: 30 mm/h (ref 0–30)

## 2023-12-20 LAB — MICROALBUMIN / CREATININE URINE RATIO
Creatinine,U: 101.9 mg/dL
Microalb Creat Ratio: 9.1 mg/g (ref 0.0–30.0)
Microalb, Ur: 0.9 mg/dL (ref 0.0–1.9)

## 2023-12-20 LAB — VITAMIN B12: Vitamin B-12: 342 pg/mL (ref 211–911)

## 2023-12-20 LAB — HEMOGLOBIN A1C: Hgb A1c MFr Bld: 6.3 % (ref 4.6–6.5)

## 2023-12-20 LAB — MAGNESIUM: Magnesium: 2.2 mg/dL (ref 1.5–2.5)

## 2023-12-20 LAB — TSH: TSH: 1.25 u[IU]/mL (ref 0.35–5.50)

## 2023-12-20 NOTE — Patient Instructions (Addendum)
 Please continue all other medications as before, including the oscal plus D3  Please have the pharmacy call with any other refills you may need.  Please keep your appointments with your specialists as you may have planned  Please go to the LAB at the blood drawing area for the tests to be done  You will be contacted by phone if any changes need to be made immediately.  Otherwise, you will receive a letter about your results with an explanation, but please check with MyChart first.  Please make an Appointment to return in Jan 2026, or sooner if needed

## 2023-12-20 NOTE — Progress Notes (Unsigned)
 Patient ID: Debra [facility], female   DOB: Oct 31, 1945, 78 y.o.   MRN: [id number]        Chief Complaint: follow up fatigue, low b12 , low vit d, hld, preDM       HPI:  Debra [facility] is a 78 y.o. female here with c/o Does c/o ongoing fatigue, but denies signficant daytime hypersomnolence.  Denies worsening depressive symptoms, suicidal ideation, or panic but has ongoing anxiety.  Lost wt 19 lbs with better diet, much less dairy, red meat,sweets.   Pt denies chest pain, increased sob or doe, wheezing, orthopnea, PND, increased LE swelling, palpitations, dizziness or syncope.   Pt denies polydipsia, polyuria, or new focal neuro s/s.    Pt denies fever, wt loss, night sweats, loss of appetite, or other constitutional symptoms   Wt Readings from Last 3 Encounters:  12/20/23 181 lb 2 oz (82.2 kg)  11/19/23 185 lb 3.2 oz (84 kg)  08/28/23 200 lb 3.2 oz (90.8 kg)   BP Readings from Last 3 Encounters:  11/19/23 136/69  08/28/23 (!) 145/59  08/14/23 (!) 149/75         Past Medical History:  Diagnosis Date   Anxiety    Cancer (HCC) 07/2023   right breast IDC   Cerebrovascular disease 02/05/2019   Concussion '06, '11   Family history of colon cancer 02/28/2016   Fibromyalgia 10/08/2013   Hyperlipidemia    diet managed   Macular degeneration disease    Pre-diabetes    Varicella    Vertigo    Past Surgical History:  Procedure Laterality Date   APPENDECTOMY  07/02/1977   BREAST LUMPECTOMY WITH RADIOACTIVE SEED LOCALIZATION Right 08/14/2023   Procedure: RIGHT BREAST SEED GUIDED LUMPECTOMY;  Surgeon: Ebbie Cough, MD;  Location: [facility];  Service: General;  Laterality: Right;   CESAREAN SECTION  07/02/1976   CHOLECYSTECTOMY  07/02/1977   laparotomy   COLONOSCOPY      reports that she has never smoked. She has never used smokeless tobacco. She reports that she does not drink alcohol and does not use drugs. family history includes COPD in her mother; Cancer in her  paternal aunt; Cancer (age of onset: 53) in her mother; Diabetes in her maternal grandfather and mother; Heart disease in her father and mother; Rheumatic fever in her father. Allergies  Allergen Reactions   Codeine  Other (See Comments)    I don't remember, it's been so long ago   Methylprednisolone  Itching    With shots only   Current Outpatient Medications on File Prior to Visit  Medication Sig Dispense Refill   anastrozole  (ARIMIDEX ) 1 MG tablet Take 1 tablet (1 mg total) by mouth daily. 30 tablet 5   cetirizine  (ZYRTEC ) 10 MG tablet Take 10 mg by mouth daily.     Cholecalciferol ([employer]S D3) 50 MCG (2000 UT) CAPS Take by mouth.      rosuvastatin  (CRESTOR ) 40 MG tablet Take 1 tablet by mouth once daily 90 tablet 0   triamcinolone  (NASACORT ) 55 MCG/ACT AERO nasal inhaler Place 2 sprays into the nose daily. (Patient not taking: Reported on 12/20/2023) 1 each 2   No current facility-administered medications on file prior to visit.        ROS:  All others reviewed and negative.  Objective        PE:  Pulse 67   Temp 98.2 F (36.8 C) (Temporal)   Ht 5' 2 (1.575 m)   Wt 181 lb 2 oz (82.2  kg)   SpO2 97%   BMI 33.13 kg/m                 Constitutional: Pt appears in NAD               HENT: Head: NCAT.                Right Ear: External ear normal.                 Left Ear: External ear normal.                Eyes: . Pupils are equal, round, and reactive to light. Conjunctivae and EOM are normal               Nose: without d/c or deformity               Neck: Neck supple. Gross normal ROM               Cardiovascular: Normal rate and regular rhythm.                 Pulmonary/Chest: Effort normal and breath sounds without rales or wheezing.                Abd:  Soft, NT, ND, + BS, no organomegaly               Neurological: Pt is alert. At baseline orientation, motor grossly intact               Skin: Skin is warm. No rashes, no other new lesions, LE edema - none                Psychiatric: Pt behavior is normal without agitation   Micro: none  Cardiac tracings I have personally interpreted today:  none  Pertinent Radiological findings (summarize): none   Lab Results  Component Value Date   WBC 5.6 12/20/2023   HGB 13.1 12/20/2023   HCT 40.2 12/20/2023   PLT 281.0 12/20/2023   GLUCOSE 109 (H) 12/20/2023   CHOL 130 12/20/2023   TRIG 92.0 12/20/2023   HDL 50.30 12/20/2023   LDLDIRECT 75.0 01/11/2023   LDLCALC 61 12/20/2023   ALT 19 12/20/2023   AST 18 12/20/2023   NA 142 12/20/2023   K 4.7 12/20/2023   CL 104 12/20/2023   CREATININE 0.73 12/20/2023   BUN 16 12/20/2023   CO2 31 12/20/2023   TSH 1.25 12/20/2023   INR 0.92 05/24/2011   HGBA1C 6.3 12/20/2023   MICROALBUR 0.9 12/20/2023   Assessment/Plan:  Debra [facility] is a 78 y.o. White or Caucasian [1] female with  has a past medical history of Anxiety, Cancer (HCC) (07/2023), Cerebrovascular disease (02/05/2019), Concussion ('06, '11), Family history of colon cancer (02/28/2016), Fibromyalgia (10/08/2013), Hyperlipidemia, Macular degeneration disease, Pre-diabetes, Varicella, and Vertigo.  Vitamin D  deficiency Last vitamin D  Lab Results  Component Value Date   VD25OH 39.92 12/20/2023   Low, to start oral replacement   Pre-diabetes Lab Results  Component Value Date   HGBA1C 6.3 12/20/2023   Stable, pt to continue current medical treatment  - diet, wt control   Hyperlipidemia Lab Results  Component Value Date   LDLCALC 61 12/20/2023   Stable, pt to continue current statin crestor  40 qd   Fatigue Etiology unclear, for labs as ordered  B12 deficiency Lab Results  Component Value Date   VITAMINB12 342 12/20/2023   Stable, cont oral replacement - b12  1000 mcg qd  Followup: No follow-ups on file.  Lynwood Rush, MD 12/22/2023 6:05 PM [facility]

## 2023-12-20 NOTE — Progress Notes (Signed)
 The test results show that your current treatment is OK, as the tests are stable.  Please continue the same plan.  There is no other need for change of treatment or further evaluation based on these results, at this time.  thanks

## 2023-12-22 NOTE — Assessment & Plan Note (Signed)
 Lab Results  Component Value Date   LDLCALC 61 12/20/2023   Stable, pt to continue current statin crestor  40 qd

## 2023-12-22 NOTE — Assessment & Plan Note (Signed)
Etiology unclear, for labs as ordered 

## 2023-12-22 NOTE — Assessment & Plan Note (Signed)
 Last vitamin D  Lab Results  Component Value Date   VD25OH 39.92 12/20/2023   Low, to start oral replacement

## 2023-12-22 NOTE — Assessment & Plan Note (Signed)
 Lab Results  Component Value Date   HGBA1C 6.3 12/20/2023   Stable, pt to continue current medical treatment  - diet, wt control

## 2023-12-22 NOTE — Assessment & Plan Note (Signed)
 Lab Results  Component Value Date   VITAMINB12 342 12/20/2023   Stable, cont oral replacement - b12 1000 mcg qd

## 2023-12-23 ENCOUNTER — Telehealth: Payer: Self-pay | Admitting: Internal Medicine

## 2023-12-23 NOTE — Telephone Encounter (Unsigned)
 Copied from CRM [id number]. Topic: General - Other >> Dec 23, 2023  9:54 AM Wess RAMAN wrote: Reason for CRM: Patient states her last office visit was on 12/20/23 and her blood pressure results are not on her mychart. She would also like to ask other questions.  Callback #: [phone]

## 2023-12-26 NOTE — Telephone Encounter (Signed)
 Copied from CRM [id number]. Topic: Clinical - Medication Question >> Dec 26, 2023  9:27 AM Carlatta H wrote: Reason for CRM:  Patient states her last office visit was on 12/20/23 and her blood pressure results are not on her mychart. She would also like to ask other questions.   Callback #: [phone]

## 2023-12-27 NOTE — Telephone Encounter (Signed)
 Called and spoke with Pt. Also Pt states she went to the pharmacy to get the Oscal vitamins and they say they don't carry them anymore and she is wanting to know what she can take in replacement of it.

## 2023-12-27 NOTE — Telephone Encounter (Signed)
 If the oscal plus D is not available, it should still be ok to take OTC calcium  carbonate 1 tab tid with meals, as well as Please take OTC Vitamin D3 at 2000 units per day, indefinitely     From, Dr.John

## 2024-01-01 NOTE — Progress Notes (Signed)
 This encounter was created in error - please disregard.  Patient no-show for appt

## 2024-01-30 ENCOUNTER — Ambulatory Visit

## 2024-01-30 VITALS — BP 120/70 | HR 73 | Ht 61.25 in | Wt 182.6 lb

## 2024-01-30 DIAGNOSIS — Z0000 Encounter for general adult medical examination without abnormal findings: Secondary | ICD-10-CM | POA: Diagnosis not present
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
## 2024-01-30 NOTE — Patient Instructions (Addendum)
 Ms. Shadden , Thank you for taking time out of your busy schedule to complete your Annual Wellness Visit with me. I enjoyed our conversation and look forward to speaking with you again next year. I, as well as your care team,  appreciate your ongoing commitment to your health goals. Please review the following plan we discussed and let me know if I can assist you in the future. Your Game plan/ To Do List     Follow up Visits: We will see or speak with you next year for your Next Medicare AWV with our clinical staff Have you seen your provider in the last 6 months (3 months if uncontrolled diabetes)? Yes  Clinician Recommendations:  Aim for 30 minutes of exercise or brisk walking, 6-8 glasses of water, and 5 servings of fruits and vegetables each day. Keep up the good work.      This is a list of the screenings recommended for you:  Health Maintenance  Topic Date Due   Zoster (Shingles) Vaccine (1 of 2) Never done   Eye exam for diabetics  10/20/2023   Medicare Annual Wellness Visit  12/24/2023   Flu Shot  01/31/2024   Hemoglobin A1C  06/20/2024   Complete foot exam   07/21/2024   Yearly kidney function blood test for diabetes  12/19/2024   Yearly kidney health urinalysis for diabetes  12/19/2024   Colon Cancer Screening  02/23/2027   DTaP/Tdap/Td vaccine (3 - Td or Tdap) 12/08/2030   Pneumococcal Vaccine for age over 33  Completed   DEXA scan (bone density measurement)  Completed   Hepatitis C Screening  Completed   Hepatitis B Vaccine  Aged Out   HPV Vaccine  Aged Out   Meningitis B Vaccine  Aged Out   COVID-19 Vaccine  Discontinued    Advanced directives: (Declined) Advance directive discussed with you today. Even though you declined this today, please call our office should you change your mind, and we can give you the proper paperwork for you to fill out. Advance Care Planning is important because it:  [x]  Makes sure you receive the medical care that is consistent with your  values, goals, and preferences  [x]  It provides guidance to your family and loved ones and reduces their decisional burden about whether or not they are making the right decisions based on your wishes.  Follow the link provided in your after visit summary or read over the paperwork we have mailed to you to help you started getting your Advance Directives in place. If you need assistance in completing these, please reach out to us  so that we can help you!  See attachments for Preventive Care and Fall Prevention Tips.

## 2024-02-19 ENCOUNTER — Other Ambulatory Visit: Payer: Self-pay

## 2024-02-19 DIAGNOSIS — Z170 Estrogen receptor positive status [ER+]: Secondary | ICD-10-CM

## 2024-02-20 ENCOUNTER — Other Ambulatory Visit: Payer: Self-pay

## 2024-02-20 ENCOUNTER — Ambulatory Visit: Payer: Self-pay | Admitting: Internal Medicine

## 2024-02-20 ENCOUNTER — Inpatient Hospital Stay: Payer: Medicare HMO | Admitting: Hematology

## 2024-02-20 ENCOUNTER — Inpatient Hospital Stay: Payer: Medicare HMO | Attending: Hematology

## 2024-02-20 VITALS — BP 130/76 | HR 68 | Temp 97.9°F | Resp 17 | Wt 182.2 lb

## 2024-02-20 DIAGNOSIS — Z1721 Progesterone receptor positive status: Secondary | ICD-10-CM | POA: Insufficient documentation

## 2024-02-20 DIAGNOSIS — Z1732 Human epidermal growth factor receptor 2 negative status: Secondary | ICD-10-CM | POA: Insufficient documentation

## 2024-02-20 DIAGNOSIS — Z923 Personal history of irradiation: Secondary | ICD-10-CM | POA: Insufficient documentation

## 2024-02-20 DIAGNOSIS — Z170 Estrogen receptor positive status [ER+]: Secondary | ICD-10-CM

## 2024-02-20 DIAGNOSIS — C50411 Malignant neoplasm of upper-outer quadrant of right female breast: Secondary | ICD-10-CM | POA: Diagnosis not present

## 2024-02-20 DIAGNOSIS — Z1509 Genetic susceptibility to other malignant neoplasm: Secondary | ICD-10-CM

## 2024-02-20 DIAGNOSIS — Z79811 Long term (current) use of aromatase inhibitors: Secondary | ICD-10-CM | POA: Insufficient documentation

## 2024-02-20 LAB — CMP (CANCER CENTER ONLY)
ALT: 29 U/L (ref 0–44)
AST: 24 U/L (ref 15–41)
Albumin: 4.4 g/dL (ref 3.5–5.0)
Alkaline Phosphatase: 90 U/L (ref 38–126)
Anion gap: 5 (ref 5–15)
BUN: 16 mg/dL (ref 8–23)
CO2: 31 mmol/L (ref 22–32)
Calcium: 9.6 mg/dL (ref 8.9–10.3)
Chloride: 106 mmol/L (ref 98–111)
Creatinine: 0.64 mg/dL (ref 0.44–1.00)
GFR, Estimated: >60 mL/min (ref >60)
Glucose, Bld: 104 mg/dL — ABNORMAL HIGH (ref 70–99)
Potassium: 4.1 mmol/L (ref 3.5–5.1)
Sodium: 142 mmol/L (ref 135–145)
Total Bilirubin: 0.4 mg/dL (ref 0.0–1.2)
Total Protein: 7.1 g/dL (ref 6.5–8.1)

## 2024-02-20 LAB — CBC WITH DIFFERENTIAL (CANCER CENTER ONLY)
Abs Immature Granulocytes: 0.03 K/uL (ref 0.00–0.07)
Basophils Absolute: 0.1 K/uL (ref 0.0–0.1)
Basophils Relative: 1 %
Eosinophils Absolute: 0.1 K/uL (ref 0.0–0.5)
Eosinophils Relative: 1 %
HCT: 40.9 % (ref 36.0–46.0)
Hemoglobin: 13 g/dL (ref 12.0–15.0)
Immature Granulocytes: 1 %
Lymphocytes Relative: 42 %
Lymphs Abs: 2.4 K/uL (ref 0.7–4.0)
MCH: 27 pg (ref 26.0–34.0)
MCHC: 31.8 g/dL (ref 30.0–36.0)
MCV: 84.9 fL (ref 80.0–100.0)
Monocytes Absolute: 0.5 K/uL (ref 0.1–1.0)
Monocytes Relative: 8 %
Neutro Abs: 2.8 K/uL (ref 1.7–7.7)
Neutrophils Relative %: 47 %
Platelet Count: 256 K/uL (ref 150–400)
RBC: 4.82 MIL/uL (ref 3.87–5.11)
RDW: 13.3 % (ref 11.5–15.5)
WBC Count: 5.8 K/uL (ref 4.0–10.5)
nRBC: 0 % (ref 0.0–0.2)

## 2024-02-20 MED ORDER — ANASTROZOLE 1 MG PO TABS: 1.0000 mg | ORAL_TABLET | Freq: Every day | ORAL | 3 refills | Status: AC

## 2024-02-20 NOTE — Progress Notes (Signed)
 Verbal order w/readback from Dr Lanny for ambulatory referral to [facility] and Genetic lab.  Orders placed in EPIC.  ===View-only below this line=== ----- Message ----- From: Lanny Callander, MD Sent: 02/20/2024  12:34 PM EDT To: Darice LITTIE Monte, Counselor; Anders SHAUNNA Scurry,*  Thanks Darice.  Anders,   Could you relay Karen's message to her? If she wants a referral to genetic, please enter and get her scheduled. Thanks   Callander ----- Message ----- From: Monte Darice LITTIE, Counselor Sent: 02/20/2024  12:27 PM EDT To: Callander Lanny, MD  It does not look like she was seen in [facility], but she does not quite meet testing criteria as the family members with breast cancer are on different sides of her family.  There is a good chance that insurance would still cover it since she is Medicare and therefore most likely would qualify for patient assistance, but we cannot state that as surely as we once could as billing policies have changed in the labs. ----- Message ----- From: Lanny Callander, MD Sent: 02/20/2024  11:05 AM EDT To: Darice LITTIE Monte, Counselor  Darice,  Could you check if she had genetic testing done when she came to [facility] in Feb 2025? I do not see any genetic test results. She has two family member (mother and paternal aunt) had breast cancer  Thx  Callander

## 2024-02-20 NOTE — Assessment & Plan Note (Addendum)
-  [id number] stage IA, ER+/PR+/HER2-, G2 -Diagnosed in January 2025 -Status post right breast lumpectomy, surgical margins were negative.  Tumor 7 mm, ER 100% strong positive, PR 90% positive, HER2 negative, Ki-67 5%.  -Given her advanced age, and favorable prognosis, I did not recommend Oncotype. -I recommend adjuvant AI for 5 years. She stared anastrozole  in March 2025

## 2024-02-20 NOTE — Progress Notes (Signed)
 [facility]   Telephone:([phone]) [phone] Fax:(336) [phone]   Clinic Follow up Note   Patient Care Team: Norleen Lynwood ORN, MD as PCP - General (Internal Medicine) Debarah Lorrene DEL., MD as Consulting Physician (Ophthalmology) Glean Stephane BROCKS, RN (Inactive) as Oncology Nurse Navigator Tyree Nanetta SAILOR, RN as Oncology Nurse Navigator Lanny Callander, MD as Consulting Physician (Hematology) Burton, Lacie K, NP as Nurse Practitioner (Nurse Practitioner)  Date of Service:  02/20/2024  CHIEF COMPLAINT: f/u of right breast cancer  CURRENT THERAPY:  Adjuvant anastrozole   Oncology History   Malignant neoplasm of upper-outer quadrant of right breast in female, estrogen receptor positive (HCC) -pT1bN0M0 stage IA, ER+/PR+/HER2-, G2 -Diagnosed in January 2025 -Status post right breast lumpectomy, surgical margins were negative.  Tumor 7 mm, ER 100% strong positive, PR 90% positive, HER2 negative, Ki-67 5%.  -Given her advanced age, and favorable prognosis, I did not recommend Oncotype. -I recommend adjuvant AI for 5 years. She stared anastrozole  in March 2025  Assessment & Plan Breast cancer, status post surgery and radiation, on anastrozole  Breast cancer status post surgery and radiation. Currently on anastrozole  for approximately five to six months with no reported side effects such as joint pain or stiffness. Mammogram due in December. Genetic testing was previously attempted but results are not available; she meets criteria for genetic testing due to family history of breast and colon cancer. - Order diagnostic mammogram for December - Schedule appointment with genetic counselor for blood draw - Continue anastrozole  with three-month refill at [facility] pharmacy  Right arm and shoulder pain after breast surgery Intermittent right arm and shoulder pain post breast surgery, likely musculoskeletal in nature due to physical activity. No lymph nodes were removed during surgery, reducing the  likelihood of lymphedema.  Enlarged cervical lymph node, under observation Slightly enlarged cervical lymph node, common location for dental or other issues, but not typical for breast cancer metastasis.  Lower extremity edema Mild lower extremity edema, not significantly concerning at this time.  Plan - She is tolerating anastrozole  well overall, will continue - Lab and follow-up in 6 months   SUMMARY OF ONCOLOGIC HISTORY: Oncology History  Malignant neoplasm of upper-outer quadrant of right breast in female, estrogen receptor positive (HCC)  07/12/2023 Cancer Staging   Staging form: Breast, AJCC 8th Edition - Clinical stage from 07/12/2023: Stage IA (cT1b, cN0, cM0, G1, ER+, PR+, HER2-) - Signed by Lanny Callander, MD on 07/23/2023 Stage prefix: Initial diagnosis Histologic grading system: 3 grade system   07/18/2023 Initial Diagnosis   Malignant neoplasm of upper-outer quadrant of right breast in female, estrogen receptor positive (HCC)   08/14/2023 Cancer Staging   Staging form: Breast, AJCC 8th Edition - Pathologic stage from 08/14/2023: Stage IA (pT1b, pN0, cM0, G2, ER+, PR+, HER2-) - Signed by Lanny Callander, MD on 08/27/2023 Histologic grading system: 3 grade system Residual tumor (R): R0      Discussed the use of AI scribe software for clinical note transcription with the patient, who gave verbal consent to proceed.  History of Present Illness Debra Atkinson is a 78 year old female with breast cancer who presents for follow-up.  She experiences discomfort in her right arm, sometimes shooting, with some swelling, attributed to physical activities. No lymph nodes were removed during her surgery. She has been taking anastrozole  since March 2025 without joint pain or stiffness. She is due for a mammogram in December 2025. She notices something in her neck but does not associate it with dental issues.  She has a history of leg swelling, which is not severe, and occasional vertigo.      All other systems were reviewed with the patient and are negative.  MEDICAL HISTORY:  Past Medical History:  Diagnosis Date   Anxiety    Cancer (HCC) 07/2023   right breast IDC   Cerebrovascular disease 02/05/2019   Concussion '06, '11   Family history of colon cancer 02/28/2016   Fibromyalgia 10/08/2013   Hyperlipidemia    diet managed   Macular degeneration disease    Pre-diabetes    Varicella    Vertigo     SURGICAL HISTORY: Past Surgical History:  Procedure Laterality Date   APPENDECTOMY  07/02/1977   BREAST LUMPECTOMY WITH RADIOACTIVE SEED LOCALIZATION Right 08/14/2023   Procedure: RIGHT BREAST SEED GUIDED LUMPECTOMY;  Surgeon: Ebbie Cough, MD;  Location: [facility];  Service: General;  Laterality: Right;   CESAREAN SECTION  07/02/1976   CHOLECYSTECTOMY  07/02/1977   laparotomy   COLONOSCOPY      I have reviewed the social history and family history with the patient and they are unchanged from previous note.  ALLERGIES:  is allergic to codeine  and methylprednisolone .  MEDICATIONS:  Current Outpatient Medications  Medication Sig Dispense Refill   anastrozole  (ARIMIDEX ) 1 MG tablet Take 1 tablet (1 mg total) by mouth daily. 90 tablet 3   cetirizine  (ZYRTEC ) 10 MG tablet Take 10 mg by mouth daily.     Cholecalciferol (CVS D3) 50 MCG (2000 UT) CAPS Take by mouth.      rosuvastatin  (CRESTOR ) 40 MG tablet Take 1 tablet by mouth once daily 90 tablet 0   triamcinolone  (NASACORT ) 55 MCG/ACT AERO nasal inhaler Place 2 sprays into the nose daily. 1 each 2   No current facility-administered medications for this visit.    PHYSICAL EXAMINATION: ECOG PERFORMANCE STATUS: 0 - Asymptomatic  Vitals:   02/20/24 1034  BP: 130/76  Pulse: 68  Resp: 17  Temp: 97.9 F (36.6 C)  SpO2: 98%   Wt Readings from Last 3 Encounters:  02/20/24 182 lb 3.2 oz (82.6 kg)  01/30/24 182 lb 9.6 oz (82.8 kg)  12/20/23 181 lb 2 oz (82.2 kg)     GENERAL:alert, no  distress and comfortable SKIN: skin color, texture, turgor are normal, no rashes or significant lesions EYES: normal, Conjunctiva are pink and non-injected, sclera clear NECK: supple, thyroid  normal size, non-tender, without nodularity LYMPH:  no palpable lymphadenopathy in the cervical, axillary  LUNGS: clear to auscultation and percussion with normal breathing effort HEART: regular rate & rhythm and no murmurs and no lower extremity edema ABDOMEN:abdomen soft, non-tender and normal bowel sounds Musculoskeletal:no cyanosis of digits and no clubbing  NEURO: alert & oriented x 3 with fluent speech, no focal motor/sensory deficits  Physical Exam NECK: Neck node slightly enlarged, common for dental issues, not typical for breast cancer. BREAST: Right breast almost back to normal, incision healed well, minimal scar tissue. EXTREMITIES: Legs slightly swollen.  LABORATORY DATA:  I have reviewed the data as listed    Latest Ref Rng & Units 02/20/2024   10:04 AM 12/20/2023   10:42 AM 07/22/2023   11:47 AM  CBC  WBC 4.0 - 10.5 K/uL 5.8  5.6  6.5   Hemoglobin 12.0 - 15.0 g/dL 86.9  86.8  86.1   Hematocrit 36.0 - 46.0 % 40.9  40.2  42.7   Platelets 150 - 400 K/uL 256  281.0  324.0  Latest Ref Rng & Units 02/20/2024   10:04 AM 12/20/2023   10:42 AM 07/22/2023   11:47 AM  CMP  Glucose 70 - 99 mg/dL 895  890  892   BUN 8 - 23 mg/dL 16  16  13    Creatinine 0.44 - 1.00 mg/dL 9.35  9.26  9.32   Sodium 135 - 145 mmol/L 142  142  141   Potassium 3.5 - 5.1 mmol/L 4.1  4.7  3.9   Chloride 98 - 111 mmol/L 106  104  104   CO2 22 - 32 mmol/L 31  31  30    Calcium  8.9 - 10.3 mg/dL 9.6  9.7  9.6   Total Protein 6.5 - 8.1 g/dL 7.1  7.2  7.5   Total Bilirubin 0.0 - 1.2 mg/dL 0.4  0.5  0.5   Alkaline Phos 38 - 126 U/L 90  73  89   AST 15 - 41 U/L 24  18  20    ALT 0 - 44 U/L 29  19  25        RADIOGRAPHIC STUDIES: I have personally reviewed the radiological images as listed and agreed with  the findings in the report. No results found.    Orders Placed This Encounter  Procedures   MM 3D DIAGNOSTIC MAMMOGRAM BILATERAL BREAST    Standing Status:   Future    Expected Date:   06/20/2024    Expiration Date:   02/19/2025    Scheduling Instructions:     [facility]    Reason for Exam (SYMPTOM  OR DIAGNOSIS REQUIRED):   screening    Preferred imaging location?:   External   All questions were answered. The patient knows to call the clinic with any problems, questions or concerns. No barriers to learning was detected. The total time spent in the appointment was 25 minutes, including review of chart and various tests results, discussions about plan of care and coordination of care plan     Onita Mattock, MD 02/20/2024

## 2024-02-20 NOTE — Progress Notes (Signed)
 The test results show that your current treatment is OK, as the tests are stable.  Please continue the same plan.  There is no other need for change of treatment or further evaluation based on these results, at this time.  thanks

## 2024-02-25 ENCOUNTER — Other Ambulatory Visit: Payer: Self-pay | Admitting: Internal Medicine

## 2024-02-28 ENCOUNTER — Ambulatory Visit: Payer: Self-pay

## 2024-02-28 NOTE — Telephone Encounter (Signed)
 Called pt and advised of no openings and will need to go to uc/ed as recommended. Pt verbalized understanding

## 2024-02-28 NOTE — Telephone Encounter (Signed)
 FYI Only or Action Required?: Action required by provider: Refusing UC/ED, requesting to be fit into Dr. Nicola schedule today.  Patient was last seen in primary care on 12/20/2023 by Norleen Lynwood ORN, MD.  Called Nurse Triage reporting Rash; Poison Oak; Pruritis; blistering; and eyes yellow not as white, itch, and weak per pt.  Symptoms began several days ago.  Interventions attempted: OTC medications: calamine.  Symptoms are: rapidly worsening.  Triage Disposition: See HCP Within 4 Hours (Or PCP Triage) - Advised ED if eyes truly seem yellow  Patient/caregiver understands and will follow disposition?: No, refuses disposition     Copied from CRM [id number]. Topic: Clinical - Red Word Triage >> Feb 28, 2024 11:42 AM Debra Atkinson wrote: Red Word that prompted transfer to Nurse Triage: Poison gotten over body that's getting worse. Eyes bothering her and breaking out in other places   Reason for Disposition  [1] Severe poison ivy, oak, or sumac reaction in the past AND [2] face or genitals involved  Answer Assessment - Initial Assessment Questions 1. APPEARANCE of RASH: What does the rash look like?      Some blistering, Probably poison oak, Raised in places 2. LOCATION: Where is the rash located?  (e.g., face, genitals, hands, legs)     Hand, arms, shoulder, waistline, inside of elbow, both cheeks and neck Think in eyes 3. SIZE: How large is the rash?      Some of both, widespread and smaller 4. ONSET: When did the rash begin?      Not sure, work in this all the time, several exposures in last few weeks Broken out starting Sunday or Monday 5. ITCHING: Does the rash itch? If Yes, ask: How bad is it?     Itching, pretty bad 6. EXPOSURE:  How were you exposed to the plant (poison ivy, poison oak, sumac)  When were you exposed?  Note: Sometimes a poison ivy/oak/sumac rash does not appear for 2 to 3 weeks after exposure.      Several times in past few weeks 7. PAST  HISTORY: Have you had a poison ivy rash before? If Yes, ask: How bad was it?     Had before got in bloodstream and needed shots 8. OTHER SYMPTOMS: Do you have any other symptoms? (e.g., fever)      No fever, SOB, cough, chest pain, fatigue, weakness In eyes, itch and weak No red and no swelling, kind of yellowish and itch, not as white Denies liver issues or bleeding, not dark yellow just not as white Using calamine   Really wanted to see him today, don't care for UC, not going to hospital, should have appts for people needing same-day    Advised pt be examined in next 4 hours, advised ED if eyes yellowing, no availability with PCP or region today, advised UC asap. Pt refusing ED and UC, requesting to be fit into Dr. Nicola schedule today. Sending message to office for call back to pt, advised go to UC if not heard back in next couple hours.  Protocols used: Poison Ivy - Oak - Sumac-A-AH

## 2024-02-29 ENCOUNTER — Emergency Department (HOSPITAL_COMMUNITY): Admission: EM | Admit: 2024-02-29 | Discharge: 2024-02-29 | Disposition: A | Discharge status: Home / Self Care

## 2024-02-29 ENCOUNTER — Other Ambulatory Visit: Payer: Self-pay

## 2024-02-29 ENCOUNTER — Encounter (HOSPITAL_COMMUNITY): Payer: Self-pay | Admitting: Emergency Medicine

## 2024-02-29 DIAGNOSIS — Z853 Personal history of malignant neoplasm of breast: Secondary | ICD-10-CM | POA: Insufficient documentation

## 2024-02-29 DIAGNOSIS — R21 Rash and other nonspecific skin eruption: Secondary | ICD-10-CM | POA: Diagnosis present

## 2024-02-29 DIAGNOSIS — L237 Allergic contact dermatitis due to plants, except food: Secondary | ICD-10-CM | POA: Insufficient documentation

## 2024-02-29 MED ORDER — PREDNISONE 10 MG PO TABS: 10.0000 mg | ORAL_TABLET | Freq: Every day | ORAL | 0 refills | Status: DC

## 2024-02-29 MED ORDER — PREDNISONE 50 MG PO TABS
60.0000 mg | ORAL_TABLET | Freq: Once | ORAL | Status: AC
  Administered 2024-02-29: 60 mg via ORAL
  Filled 2024-02-29: qty 1

## 2024-02-29 NOTE — Discharge Instructions (Addendum)
 Please take the steroids as prescribed and follow-up with your doctor.  Return to the ER for worsening symptoms.

## 2024-02-29 NOTE — ED Triage Notes (Signed)
 Pt to the ED with complaints of a rash from poison [place] over the last week.

## 2024-02-29 NOTE — ED Provider Notes (Signed)
 [place] EMERGENCY DEPARTMENT AT [facility] Provider Note   CSN: [id number] Arrival date & time: 02/29/24  2129     Patient presents with: [facility]   Debra Atkinson is a 78 y.o. female.   78 year old female with past medical history of breast cancer in remission presenting to the emergency department today with itchy rash.  Patient states this on her bilateral arms as well as her foot.  She states that she noticed some of her abdomen that is going to her pubic area as well.  The patient states that she initially felt that she had contact with possible poison oak 1 week ago.  States that she has been working outside a lot lately and has noticed that seems to have been getting worse over the past few days.  The rash is itchy.  She denies any mucous membrane involvement.  She is on anastrozole  as well as Crestor .  Denies starting any new medications recently.        Prior to Admission medications   Medication Sig Start Date End Date Taking? Authorizing Provider  predniSONE  (DELTASONE ) 10 MG tablet Take 1 tablet (10 mg total) by mouth daily with breakfast. Take 5 tablets per day for 4 days, take 4 tablets per day for 4 days, take 3 tablets per day for 4 days, take 2 tablets per day for 4 days, take 1 tablet per day for 4 days 02/29/24  Yes Ula Prentice SAUNDERS, MD  anastrozole  (ARIMIDEX ) 1 MG tablet Take 1 tablet (1 mg total) by mouth daily. 02/20/24   Lanny Callander, MD  cetirizine  (ZYRTEC ) 10 MG tablet Take 10 mg by mouth daily.    [provider]  Cholecalciferol (CVS D3) 50 MCG (2000 UT) CAPS Take by mouth.     [provider]  rosuvastatin  (CRESTOR ) 40 MG tablet Take 1 tablet by mouth once daily 02/26/24   Norleen Lynwood ORN, MD  triamcinolone  (NASACORT ) 55 MCG/ACT AERO nasal inhaler Place 2 sprays into the nose daily. 04/25/23   Norleen Lynwood ORN, MD    Allergies: Codeine  and Methylprednisolone     Review of Systems  Skin:  Positive for rash.  All other systems reviewed  and are negative.   Updated Vital Signs BP (!) 149/70 (BP Location: Right Arm)   Pulse 73   Temp 98.3 F (36.8 C) (Oral)   Ht 5' 2 (1.575 m)   Wt 81.6 kg   SpO2 99%   BMI 32.92 kg/m   Physical Exam Vitals and nursing note reviewed.   Gen: NAD Eyes: PERRL, EOMI HEENT: no oropharyngeal swelling Neck: trachea midline Resp: clear to auscultation bilaterally Card: RRR, no murmurs, rubs, or gallops Abd: nontender, nondistended Extremities: no calf tenderness, no edema Vascular: 2+ radial pulses bilaterally, 2+ DP pulses bilaterally Skin: Erythematous patches noted over the bilateral arms as well as the lower abdomen and into the pubic hair on the right that does not affect the mucous membranes, no targetoid lesions noted, no skin sloughing or Nikolsky skin noted, there are overlying signs of excoriation as well as calamine lotion Psyc: acting appropriately   (all labs ordered are listed, but only abnormal results are displayed) Labs Reviewed - No data to display  EKG: None  Radiology: No results found.   Procedures   Medications Ordered in the ED  predniSONE  (DELTASONE ) tablet 60 mg (has no administration in time range)  Medical Decision Making 78 year old female with past medical history of breast cancer in remission as well as hyperlipidemia presenting to the emergency department today with itchy rash.  The patient does report possible exposure to poison oak.  So seem to be on multiple areas here.  Will go ahead and cover the patient with prednisone .  I did discuss with the patient the remote possibility of Stevens-Johnson syndrome if the rash were to start to affect the mucous membranes but this does not appear to be the case today.  She is not on medications that are normally associated with this.  I think she is safe for discharge with outpatient follow-up.  She is encouraged to return to the emergency department if she does notice  any mucous membrane involvement or worsening symptoms.  Risk Prescription drug management.        Final diagnoses:  Poison oak dermatitis    ED Discharge Orders          Ordered    predniSONE  (DELTASONE ) 10 MG tablet  Daily with breakfast        02/29/24 2159               Ula Prentice SAUNDERS, MD 02/29/24 2206

## 2024-03-19 ENCOUNTER — Ambulatory Visit (INDEPENDENT_AMBULATORY_CARE_PROVIDER_SITE_OTHER): Admitting: Internal Medicine

## 2024-03-19 ENCOUNTER — Encounter: Payer: Self-pay | Admitting: Internal Medicine

## 2024-03-19 VITALS — BP 128/82 | HR 79 | Temp 98.3°F | Ht 62.0 in | Wt 179.0 lb

## 2024-03-19 DIAGNOSIS — E559 Vitamin D deficiency, unspecified: Secondary | ICD-10-CM | POA: Diagnosis not present

## 2024-03-19 DIAGNOSIS — H9313 Tinnitus, bilateral: Secondary | ICD-10-CM

## 2024-03-19 DIAGNOSIS — R221 Localized swelling, mass and lump, neck: Secondary | ICD-10-CM | POA: Diagnosis not present

## 2024-03-19 DIAGNOSIS — R519 Headache, unspecified: Secondary | ICD-10-CM

## 2024-03-19 DIAGNOSIS — R7303 Prediabetes: Secondary | ICD-10-CM

## 2024-03-19 DIAGNOSIS — R42 Dizziness and giddiness: Secondary | ICD-10-CM

## 2024-03-19 MED ORDER — MECLIZINE HCL 12.5 MG PO TABS: 12.5000 mg | ORAL_TABLET | Freq: Three times a day (TID) | ORAL | 1 refills | Status: AC | PRN

## 2024-03-19 NOTE — Assessment & Plan Note (Signed)
 Lab Results  Component Value Date   HGBA1C 6.3 12/20/2023   Stable, pt to continue current medical treatment  - diet, wt control

## 2024-03-19 NOTE — Assessment & Plan Note (Signed)
 Mild recurring, no falls, now for restart meclizine  prn, refer Vestibular rehab, also for Brain MRI with tinnitus

## 2024-03-19 NOTE — Assessment & Plan Note (Addendum)
 Last vitamin D  Lab Results  Component Value Date   VD25OH 39.92 12/20/2023   Low, to start oral replacement

## 2024-03-19 NOTE — Progress Notes (Signed)
 Patient ID: Debra Atkinson, female   DOB: July 27, 1945, 78 y.o.   MRN: [id number]        Chief Complaint: follow up vertigo, left neck nodule / mass, tinnitus, low vit d, pre DM       HPI:  Debra Atkinson is a 78 y.o. female here with c/o 1 wk onset recurrent vertigo with bilateral tinnitus for unclear reason, no fever, chills, ST cough or congestion. Has occasional HA.   Pt denies chest pain, increased sob or doe, wheezing, orthopnea, PND, increased LE swelling, palpitations, dizziness or syncope.   Pt denies polydipsia, polyuria, or new focal neuro s/s.   Also w/ c/o Approx 1 cm subq nodule mass left mid neck above the thyroid , firm nontender, mobile       Wt Readings from Last 3 Encounters:  03/19/24 179 lb (81.2 kg)  02/29/24 180 lb (81.6 kg)  02/20/24 182 lb 3.2 oz (82.6 kg)   BP Readings from Last 3 Encounters:  03/19/24 128/82  02/29/24 (!) 149/70  02/20/24 130/76         Past Medical History:  Diagnosis Date   Anxiety    Cancer (HCC) 07/2023   right breast IDC   Cerebrovascular disease 02/05/2019   Concussion '06, '11   Family history of colon cancer 02/28/2016   Fibromyalgia 10/08/2013   Hyperlipidemia    diet managed   Macular degeneration disease    Pre-diabetes    Varicella    Vertigo    Past Surgical History:  Procedure Laterality Date   APPENDECTOMY  07/02/1977   BREAST LUMPECTOMY WITH RADIOACTIVE SEED LOCALIZATION Right 08/14/2023   Procedure: RIGHT BREAST SEED GUIDED LUMPECTOMY;  Surgeon: Ebbie Cough, MD;  Location: [facility];  Service: General;  Laterality: Right;   CESAREAN SECTION  07/02/1976   CHOLECYSTECTOMY  07/02/1977   laparotomy   COLONOSCOPY      reports that she has never smoked. She has never used smokeless tobacco. She reports that she does not drink alcohol and does not use drugs. family history includes COPD in her mother; Cancer in her paternal aunt; Cancer (age of onset: 56) in her mother; Diabetes in her maternal  grandfather and mother; Heart disease in her father and mother; Rheumatic fever in her father. Allergies  Allergen Reactions   Codeine  Other (See Comments)    I don't remember, it's been so long ago   Methylprednisolone  Itching    With shots only  Other Reaction(s): injection of med gave localized redness and itch at injection site, no systemic symptoms and no problem w/ topical steroids   Current Outpatient Medications on File Prior to Visit  Medication Sig Dispense Refill   anastrozole  (ARIMIDEX ) 1 MG tablet Take 1 tablet (1 mg total) by mouth daily. 90 tablet 3   cetirizine  (ZYRTEC ) 10 MG tablet Take 10 mg by mouth daily.     Cholecalciferol (CVS D3) 50 MCG (2000 UT) CAPS Take by mouth.      predniSONE  (DELTASONE ) 10 MG tablet Take 1 tablet (10 mg total) by mouth daily with breakfast. Take 5 tablets per day for 4 days, take 4 tablets per day for 4 days, take 3 tablets per day for 4 days, take 2 tablets per day for 4 days, take 1 tablet per day for 4 days 60 tablet 0   rosuvastatin  (CRESTOR ) 40 MG tablet Take 1 tablet by mouth once daily 90 tablet 0   triamcinolone  (NASACORT ) 55 MCG/ACT AERO nasal inhaler Place 2  sprays into the nose daily. 1 each 2   No current facility-administered medications on file prior to visit.        ROS:  All others reviewed and negative.  Objective        PE:  BP 128/82 (BP Location: Left Arm, Patient Position: Sitting, Cuff Size: Normal)   Pulse 79   Temp 98.3 F (36.8 C) (Oral)   Ht 5' 2 (1.575 m)   Wt 179 lb (81.2 kg)   SpO2 98%   BMI 32.74 kg/m                 Constitutional: Pt appears in NAD               HENT: Head: NCAT.                Right Ear: External ear normal.                 Left Ear: External ear normal.                Eyes: . Pupils are equal, round, and reactive to light. Conjunctivae and EOM are normal               Nose: without d/c or deformity               Neck: Neck supple. Gross normal ROM, Approx 1 cm subq nodule  mass left mid neck above the thyroid , firm nontender, mobile               Cardiovascular: Normal rate and regular rhythm.                 Pulmonary/Chest: Effort normal and breath sounds without rales or wheezing.                Abd:  Soft, NT, ND, + BS, no organomegaly               Neurological: Pt is alert. At baseline orientation, motor grossly intact               Skin: Skin is warm. No rashes, no other new lesions, LE edema - none               Psychiatric: Pt behavior is normal without agitation   Micro: none  Cardiac tracings I have personally interpreted today:  none  Pertinent Radiological findings (summarize): none   Lab Results  Component Value Date   WBC 5.8 02/20/2024   HGB 13.0 02/20/2024   HCT 40.9 02/20/2024   PLT 256 02/20/2024   GLUCOSE 104 (H) 02/20/2024   CHOL 130 12/20/2023   TRIG 92.0 12/20/2023   HDL 50.30 12/20/2023   LDLDIRECT 75.0 01/11/2023   LDLCALC 61 12/20/2023   ALT 29 02/20/2024   AST 24 02/20/2024   NA 142 02/20/2024   K 4.1 02/20/2024   CL 106 02/20/2024   CREATININE 0.64 02/20/2024   BUN 16 02/20/2024   CO2 31 02/20/2024   TSH 1.25 12/20/2023   INR 0.92 05/24/2011   HGBA1C 6.3 12/20/2023   MICROALBUR 0.9 12/20/2023   Assessment/Plan:  Debra Atkinson is a 78 y.o. White or Caucasian [1] female with  has a past medical history of Anxiety, Cancer (HCC) (07/2023), Cerebrovascular disease (02/05/2019), Concussion ('06, '11), Family history of colon cancer (02/28/2016), Fibromyalgia (10/08/2013), Hyperlipidemia, Macular degeneration disease, Pre-diabetes, Varicella, and Vertigo.  Pre-diabetes Lab Results  Component Value Date   HGBA1C 6.3 12/20/2023  Stable, pt to continue current medical treatment  - diet, wt control   Vertigo Mild recurring, no falls, now for restart meclizine  prn, refer Vestibular rehab, also for Brain MRI with tinnitus  Vitamin D  deficiency Last vitamin D  Lab Results  Component Value Date   VD25OH 39.92  12/20/2023   Low, to start oral replacement   Bilateral tinnitus Exam benign, pt encouraged to sleep with radio on  Neck mass Approx 1 cm subq nodule mass left mid neck above the thyroid , firm nontender, mobile - for neck u/s -   Followup: Return if symptoms worsen or fail to improve.  Lynwood Rush, MD 03/19/2024 7:26 PM [facility]

## 2024-03-19 NOTE — Assessment & Plan Note (Signed)
 Exam benign, pt encouraged to sleep with radio on

## 2024-03-19 NOTE — Assessment & Plan Note (Signed)
 Approx 1 cm subq nodule mass left mid neck above the thyroid , firm nontender, mobile - for neck u/s -

## 2024-03-19 NOTE — Patient Instructions (Addendum)
 Please take all new medication as prescribed - the meclizine  as needed for dizziness  Please continue all other medications as before, and refills have been done if requested.  Please have the pharmacy call with any other refills you may need.  Please keep your appointments with your specialists as you may have planned  You will be contacted regarding the referral for: Vestibular Rehab (physical therapy), and Brain MRI, and Neck ultrasound  We can hold on further lab testing today

## 2024-03-20 ENCOUNTER — Telehealth: Payer: Self-pay

## 2024-03-20 ENCOUNTER — Encounter: Payer: Self-pay | Admitting: Internal Medicine

## 2024-03-20 NOTE — Telephone Encounter (Signed)
 Copied from CRM [id number]. Topic: Clinical - Medical Advice >> Mar 20, 2024  4:00 PM Tysheama G wrote: Reason for CRM: Patient stated she was reading in her MyChart and she wanted to know what  PRE DM means and how does the doctor know she's low on vit D when they didn't do any bloodwork? Callback number [phone]

## 2024-03-23 ENCOUNTER — Ambulatory Visit
Admission: RE | Admit: 2024-03-23 | Discharge: 2024-03-23 | Disposition: A | Discharge status: Home / Self Care | Source: Ambulatory Visit | Attending: Internal Medicine | Admitting: Internal Medicine

## 2024-03-23 ENCOUNTER — Encounter: Payer: Self-pay | Admitting: Internal Medicine

## 2024-03-23 DIAGNOSIS — R221 Localized swelling, mass and lump, neck: Secondary | ICD-10-CM

## 2024-03-23 DIAGNOSIS — E041 Nontoxic single thyroid nodule: Secondary | ICD-10-CM | POA: Diagnosis not present

## 2024-03-26 ENCOUNTER — Other Ambulatory Visit: Payer: Self-pay | Admitting: Internal Medicine

## 2024-03-26 ENCOUNTER — Ambulatory Visit: Payer: Self-pay | Admitting: Internal Medicine

## 2024-03-26 DIAGNOSIS — R221 Localized swelling, mass and lump, neck: Secondary | ICD-10-CM

## 2024-03-27 ENCOUNTER — Ambulatory Visit
Admission: RE | Admit: 2024-03-27 | Discharge: 2024-03-27 | Disposition: A | Discharge status: Home / Self Care | Source: Ambulatory Visit | Attending: Internal Medicine | Admitting: Internal Medicine

## 2024-03-27 ENCOUNTER — Telehealth: Payer: Self-pay | Admitting: Radiology

## 2024-03-27 DIAGNOSIS — H9313 Tinnitus, bilateral: Secondary | ICD-10-CM

## 2024-03-27 DIAGNOSIS — R519 Headache, unspecified: Secondary | ICD-10-CM

## 2024-03-27 DIAGNOSIS — R42 Dizziness and giddiness: Secondary | ICD-10-CM

## 2024-03-27 NOTE — Telephone Encounter (Signed)
 Copied from CRM [id number]. Topic: General - Other >> Mar 26, 2024 11:15 AM Carlyon D wrote: Reason for CRM: Jolan with [facility] received an order For CT scan through epic Sonny states they need order changed to (with contrast only)! Please reach out to Sonny  if any further questions [phone] option 1 then option 4.

## 2024-03-30 NOTE — Telephone Encounter (Signed)
 Believe this issue has since been resolved

## 2024-04-06 ENCOUNTER — Ambulatory Visit
Admission: RE | Admit: 2024-04-06 | Discharge: 2024-04-06 | Disposition: A | Source: Ambulatory Visit | Attending: Internal Medicine | Admitting: Internal Medicine

## 2024-04-06 ENCOUNTER — Other Ambulatory Visit: Payer: Self-pay | Admitting: Internal Medicine

## 2024-04-06 DIAGNOSIS — R221 Localized swelling, mass and lump, neck: Secondary | ICD-10-CM | POA: Diagnosis not present

## 2024-04-06 MED ORDER — IOPAMIDOL (ISOVUE-300) INJECTION 61%
75.0000 mL | Freq: Once | INTRAVENOUS | Status: AC | PRN
  Administered 2024-04-06: 75 mL via INTRAVENOUS

## 2024-04-10 ENCOUNTER — Ambulatory Visit: Payer: Self-pay | Admitting: Internal Medicine

## 2024-04-10 ENCOUNTER — Telehealth: Payer: Self-pay | Admitting: Hematology

## 2024-04-10 NOTE — Telephone Encounter (Signed)
 Patient was emailed and called friendly reminder of Genetic Testing Appointment for 04/14/2024 9am- patient advised at this time she has a lot going on and wishes to not add on more bills- may reschedule it later thank you

## 2024-04-14 ENCOUNTER — Inpatient Hospital Stay: Admitting: Genetic Counselor

## 2024-04-14 ENCOUNTER — Inpatient Hospital Stay

## 2024-04-17 ENCOUNTER — Telehealth: Payer: Self-pay

## 2024-04-17 NOTE — Telephone Encounter (Signed)
 Copied from CRM [id number]. Topic: Referral - Status >> Apr 16, 2024  4:08 PM Sasha M wrote: Reason for CRM: pt requesting Dr Norleen or nurse to call her to discuss referral status

## 2024-04-17 NOTE — Telephone Encounter (Signed)
 Pt is concerned as she was reading the results from her Ct scan and came across this  . Two 4 mm nodules in the right lung apex, new from 2012 and indeterminate.    She wants this explained to her.

## 2024-04-17 NOTE — Telephone Encounter (Signed)
 Oh that should be ok , as they only get concerned about nodules > 8 mm, o/w no further follow up is needed

## 2024-04-21 NOTE — Telephone Encounter (Signed)
 Pt states understanding no further questions at this time.

## 2024-05-01 ENCOUNTER — Ambulatory Visit (HOSPITAL_COMMUNITY)

## 2024-05-06 ENCOUNTER — Ambulatory Visit (INDEPENDENT_AMBULATORY_CARE_PROVIDER_SITE_OTHER)

## 2024-05-06 ENCOUNTER — Encounter (INDEPENDENT_AMBULATORY_CARE_PROVIDER_SITE_OTHER): Payer: Self-pay

## 2024-05-06 VITALS — BP 130/85 | HR 77 | Ht 62.0 in | Wt 179.0 lb

## 2024-05-06 DIAGNOSIS — J342 Deviated nasal septum: Secondary | ICD-10-CM | POA: Diagnosis not present

## 2024-05-06 DIAGNOSIS — K118 Other diseases of salivary glands: Secondary | ICD-10-CM | POA: Diagnosis not present

## 2024-05-06 DIAGNOSIS — J309 Allergic rhinitis, unspecified: Secondary | ICD-10-CM

## 2024-05-06 MED ORDER — AZELASTINE HCL 0.1 % NA SOLN: 2.0000 | Freq: Two times a day (BID) | NASAL | 12 refills | Status: DC

## 2024-05-06 NOTE — Progress Notes (Signed)
 HPI:   Discussed the use of AI scribe software for clinical note transcription with the patient, who gave verbal consent to proceed.  History of Present Illness Debra Atkinson is a 78 year old female with a history of breast cancer who presents with a left neck mass. She is accompanied by her husband.  She noticed a lump in her neck, which has increased in size. The lump is non-painful, and she has not experienced dry mouth, difficulty breathing, choking, or coughing. A CT scan was performed which showed the mass of the left submandibular gland.  She has a history of breast cancer for which she underwent surgery in February. She did not receive radiation therapy and is currently taking an antiestrogen medication, which she attributes to occasional gum soreness and dry mouth.  She had a thyroid  ultrasound in the past to monitor thyroid  nodules. Additionally, a cyst in her sinus was identified during an MRI of her head and neck. She experiences occasional nasal congestion, which she attributes to allergies, and has used nasal sprays in the past.    PMH/Meds/All/SocHx/FamHx/ROS: Past Medical History:  Diagnosis Date   Anxiety    Cancer (HCC) 07/2023   right breast IDC   Cerebrovascular disease 02/05/2019   Concussion '06, '11   Family history of colon cancer 02/28/2016   Fibromyalgia 10/08/2013   Hyperlipidemia    diet managed   Macular degeneration disease    Pre-diabetes    Varicella    Vertigo    Past Surgical History:  Procedure Laterality Date   APPENDECTOMY  07/02/1977   BREAST LUMPECTOMY WITH RADIOACTIVE SEED LOCALIZATION Right 08/14/2023   Procedure: RIGHT BREAST SEED GUIDED LUMPECTOMY;  Surgeon: Ebbie Cough, MD;  Location: [facility];  Service: General;  Laterality: Right;   CESAREAN SECTION  07/02/1976   CHOLECYSTECTOMY  07/02/1977   laparotomy   COLONOSCOPY     No family history of bleeding disorders, wound healing problems or difficulty with  anesthesia.  Social Connections: Socially Integrated (01/30/2024)   Social Connection and Isolation Panel    Frequency of Communication with Friends and Family: More than three times a week    Frequency of Social Gatherings with Friends and Family: More than three times a week    Attends Religious Services: More than 4 times per year    Active Member of [employer] or Organizations: Yes    Attends Engineer, Structural: More than 4 times per year    Marital Status: Married    Current Outpatient Medications:    anastrozole  (ARIMIDEX ) 1 MG tablet, Take 1 tablet (1 mg total) by mouth daily., Disp: 90 tablet, Rfl: 3   cetirizine  (ZYRTEC ) 10 MG tablet, Take 10 mg by mouth daily., Disp: , Rfl:    Cholecalciferol ([employer]S D3) 50 MCG (2000 UT) CAPS, Take by mouth. , Disp: , Rfl:    meclizine  (ANTIVERT ) 12.5 MG tablet, Take 1 tablet (12.5 mg total) by mouth 3 (three) times daily as needed., Disp: 40 tablet, Rfl: 1   predniSONE  (DELTASONE ) 10 MG tablet, Take 1 tablet (10 mg total) by mouth daily with breakfast. Take 5 tablets per day for 4 days, take 4 tablets per day for 4 days, take 3 tablets per day for 4 days, take 2 tablets per day for 4 days, take 1 tablet per day for 4 days, Disp: 60 tablet, Rfl: 0   rosuvastatin  (CRESTOR ) 40 MG tablet, Take 1 tablet by mouth once daily, Disp: 90 tablet, Rfl: 0  triamcinolone  (NASACORT ) 55 MCG/ACT AERO nasal inhaler, Place 2 sprays into the nose daily., Disp: 1 each, Rfl: 2 A complete ROS was performed with pertinent positives/negatives noted in the HPI. The remainder of the ROS are negative.   Physical Exam:  BP 130/85   Pulse 77   Ht 5' 2 (1.575 m)   Wt 179 lb (81.2 kg)   SpO2 94%   BMI 32.74 kg/m  General: Well developed, well nourished. No acute distress.  Head/Face: Normocephalic. No sinus tenderness. Facial nerve intact and equal bilaterally. No facial lacerations. Eyes: PERRL, no scleral icterus or conjunctival hemorrhage. EOMI. Ears: No gross  deformity. Normal external canal. Tympanic membrane in tact bilaterally Hearing: Normal speech reception.  Nose: No gross deformity or lesions. No purulent discharge. Bilateral inferior turbinate hypertrophy. Rightward nasal septal deviation Mouth/Oropharynx: Lips without any lesions. Dentition good. No mucosal lesions within the oropharynx. No tonsillar enlargement, exudate, or lesions. Pharyngeal walls symmetrical. Uvula midline. Tongue midline without lesions. Larynx: See TFL if applicable Nasopharynx: See TFL if applicable Neck: Trachea midline. No thyromegaly or nodules palpated. No crepitus. Left submandibular gland with area of firm 1cm ovoid mass. Good salivary flow from bilateral Wharton's ducts Lymphatic: No lymphadenopathy in the neck. Respiratory: No stridor or distress. Room air. Cardiovascular: Regular rate and rhythm. Extremities: No edema or cyanosis. Warm and well-perfused. Skin: No scars or lesions on face or neck. Neurologic: CN II-XII grossly intact. Moving all extremities without gross abnormality. Other:  Independent Review of Additional Tests or Records: CT neck 04/06/2024 - left submandibular mass approximately 1 cm, no cervical lymphadenopathy noted MRI Brain 03/27/2024 - small right maxillary sinus cyst  Procedures: None Impression & Plans: Assessment & Plan Submandibular gland mass, left - Order ultrasound-guided fine needle biopsy of the submandibular gland mass. - Schedule follow-up appointment in two weeks to discuss biopsy results.  Allergic rhinitis Nasal congestion likely due to allergic rhinitis, with positional nasal drip - Continue nasacort  nasal spray for daily use in the morning. - Prescribe azelastine nasal spray for use in the morning and at night if symptoms persist. - nasal obstruction may be exacerbated by rightward septal deviation    Follow-up in 2 weeks after biopsy to discuss surgical options.  [name], DO [facility]

## 2024-05-06 NOTE — Progress Notes (Signed)
145/84 

## 2024-05-08 ENCOUNTER — Encounter (HOSPITAL_COMMUNITY): Payer: Self-pay

## 2024-05-08 NOTE — Progress Notes (Signed)
 [name], MD  Sonam Wandel OK for possible FNA of left submandibular gland lesion under local only. I think this may be a pseudo-lesion by CT; confirm at time of procedure w/ US .  GY       Previous Messages    ----- Message ----- From: Debra Atkinson Sent: 05/07/2024  10:23 AM EST To: Debra Atkinson; Ir Procedure Requests Subject: US  FINE NEEDLE ASP 1ST LESION                  Procedure : US  FINE NEEDLE ASP 1ST LESION   Reason : left submandibular mass Dx: Mass of left submandibular gland [K11.8 (ICD-10-CM)]    History : CT soft tissue neck w/ , MR brain w/o , US  soft tissue head and neck  Provider : Mila Adah SAUNDERS, DO  Contact : [phone]

## 2024-05-19 NOTE — Progress Notes (Signed)
 Patient for US  guided FNA LT submandibular gland biopsy on Wed 05/20/24, I called and spoke with the patient on the phone and gave pre-procedure instructions. Pt was made aware to be here at 12:30p and check in at the [facility] registration desk.. Pt stated understanding.  Called 05/18/24

## 2024-05-20 ENCOUNTER — Ambulatory Visit (INDEPENDENT_AMBULATORY_CARE_PROVIDER_SITE_OTHER)

## 2024-05-20 ENCOUNTER — Ambulatory Visit
Admission: RE | Admit: 2024-05-20 | Discharge: 2024-05-20 | Disposition: A | Discharge status: Home / Self Care | Source: Ambulatory Visit

## 2024-05-20 DIAGNOSIS — K118 Other diseases of salivary glands: Secondary | ICD-10-CM | POA: Diagnosis not present

## 2024-05-20 DIAGNOSIS — K119 Disease of salivary gland, unspecified: Secondary | ICD-10-CM | POA: Diagnosis not present

## 2024-05-20 MED ORDER — LIDOCAINE HCL (PF) 1 % IJ SOLN
3.0000 mL | Freq: Once | INTRAMUSCULAR | Status: AC
  Administered 2024-05-20: 3 mL via INTRADERMAL

## 2024-05-20 NOTE — Procedures (Signed)
 Interventional Radiology Procedure:   Indications: Left submandibular lesion  Procedure: US  guided FNA  Findings: Hypoechoic heterogeneous lesion along inferior left submandibular gland.  5 FNAs performed.   Complications: None     EBL: Minimal   Plan:  Discharge to home.    Owais Pruett R. Philip, MD  Pager: [phone]

## 2024-05-21 ENCOUNTER — Telehealth (INDEPENDENT_AMBULATORY_CARE_PROVIDER_SITE_OTHER): Payer: Self-pay

## 2024-05-21 NOTE — Telephone Encounter (Signed)
 The patient called in asking if she can change the Band-Aid from the biopsy yesterday?  Can she put a new Band-Aid on, take a shower and can she mow the yard today?

## 2024-05-25 LAB — CYTOLOGY - NON PAP

## 2024-06-02 NOTE — Therapy (Signed)
 OUTPATIENT PHYSICAL THERAPY VESTIBULAR EVALUATION     Patient Name: Debra Atkinson MRN: [id number] DOB:06-07-1946, 78 y.o., female Today's Date: 06/03/2024  END OF SESSION:  PT End of Session - 06/03/24 1030     Visit Number 1    Number of Visits 4    Date for Recertification  07/15/24    Authorization Type [employer]    PT Start Time 1000    PT Stop Time 1030    PT Time Calculation (min) 30 min          Past Medical History:  Diagnosis Date   Anxiety    Cancer (HCC) 07/2023   right breast IDC   Cerebrovascular disease 02/05/2019   Concussion '06, '11   Family history of colon cancer 02/28/2016   Fibromyalgia 10/08/2013   Hyperlipidemia    diet managed   Macular degeneration disease    Pre-diabetes    Varicella    Vertigo    Past Surgical History:  Procedure Laterality Date   APPENDECTOMY  07/02/1977   BREAST LUMPECTOMY WITH RADIOACTIVE SEED LOCALIZATION Right 08/14/2023   Procedure: RIGHT BREAST SEED GUIDED LUMPECTOMY;  Surgeon: Ebbie Cough, MD;  Location: [facility];  Service: General;  Laterality: Right;   CESAREAN SECTION  07/02/1976   CHOLECYSTECTOMY  07/02/1977   laparotomy   COLONOSCOPY     Patient Active Problem List   Diagnosis Date Noted   Bilateral tinnitus 03/19/2024   Neck mass 03/19/2024   Palpitations 07/24/2023   Malignant neoplasm of upper-outer quadrant of right breast in female, estrogen receptor positive (HCC) 07/18/2023   Tendonitis 07/21/2022   Right leg pain 07/04/2022   Right foot pain 07/04/2022   Nosebleed 05/07/2022   Multiple contusions 01/16/2022   Chest pain 08/06/2021   Finger pain, right 08/06/2021   Acute pain of left knee 12/07/2020   Hematoma 12/07/2020   Abrasion of right arm 12/07/2020   Urinary incontinence 11/08/2020   Dyspnea 11/08/2020   URI (upper respiratory infection) 11/08/2020   Pilonidal cyst 08/06/2020   Macular degeneration, dry 08/05/2020   COVID-19 virus infection  07/15/2020   Chronic right-sided headache 11/28/2019   Acute hearing loss, left 11/28/2019   Anxiety 09/27/2019   Facial cellulitis 09/25/2019   Abdominal pain, right lower quadrant 09/07/2019   OAB (overactive bladder) 08/06/2019   Right sided sciatica 08/06/2019   Pain of left thumb 08/06/2019   Cerebrovascular disease 02/05/2019   Rash and nonspecific skin eruption 01/19/2019   Right knee pain 09/01/2018   Great toe pain, right 09/01/2018   Acute pharyngitis 02/24/2018   Splinter in skin 01/22/2018   Right serous otitis media 11/28/2017   Vertigo 11/28/2017   Bilateral leg pain 11/28/2017   Dysuria 07/06/2017   B12 deficiency 05/07/2017   Vitamin D  deficiency 05/07/2017   Fatigue 02/13/2017   Lateral epicondylitis of left elbow 10/31/2016   Toe pain, left 10/31/2016   Posterior neck pain 10/31/2016   Degenerative arthritis of knee, bilateral 07/30/2016   Thyroid  nodule 06/27/2016   Pain in both knees 06/27/2016   Mass of right side of neck 04/29/2016   Dizziness 03/07/2016   Family history of colon cancer 02/28/2016   Constipation 02/28/2016   UTI (urinary tract infection) 07/29/2015   Cough 05/05/2015   BPV (benign positional vertigo) 11/09/2014   Atypical chest pain 10/08/2014   Upper airway cough syndrome 06/08/2014   Sinusitis, chronic 04/09/2014   Allergic conjunctivitis 04/09/2014   Urinary frequency 02/18/2014   Eustachian tube  dysfunction 12/04/2013   Fibromyalgia 10/08/2013   Low back pain 09/18/2012   Allergic rhinitis 09/18/2012   Need for prophylactic vaccination and inoculation against influenza 04/28/2012   Encounter for well adult exam with abnormal findings 04/28/2012   Pre-diabetes 04/25/2011   Hyperlipidemia     PCP: Norleen Lynwood ORN, MD REFERRING PROVIDER: Norleen Lynwood ORN, MD  REFERRING DIAG: R42 (ICD-10-CM) - Vertigo  THERAPY DIAG:  Dizziness and giddiness  ONSET DATE: 10 or 15 years ev Rationale for Evaluation and Treatment:  Rehabilitation  SUBJECTIVE:   SUBJECTIVE STATEMENT: 15 minutes late for eval; had a bad falls in 2015 and 2011; both times hit her head and had concussion.  Was bending over afterward to wash a car and started with vertigo.  Has had several bouts through the years.  Meclizine  did not help; Valium  helped some but she does not want to take it.  Has neverer   Pt accompanied by: self  PERTINENT HISTORY: anxiety; arthritis in neck; macular degeneration  PAIN:  Are you having pain? No  PRECAUTIONS: Fall  RED FLAGS: None   WEIGHT BEARING RESTRICTIONS: No  FALLS: Has patient fallen in last 6 months? No   PLOF: Independent  PATIENT GOALS: to not get dizzy  OBJECTIVE:  Note: Objective measures were completed at Evaluation unless otherwise noted.  DIAGNOSTIC FINDINGS:   COGNITION: Overall cognitive status: Within functional limits for tasks assessed   SENSATION: WFL  EDEMA:  None noted  POSTURE:  rounded shoulders and forward head  Cervical ROM:  grossly wfl throughout  Active AROM (deg) eval  Flexion   Extension   Right lateral flexion   Left lateral flexion   Right rotation   Left rotation   (Blank rows = not tested)  STRENGTH: not tested  LOWER EXTREMITY MMT:   MMT Right eval Left eval  Hip flexion    Hip abduction    Hip adduction    Hip internal rotation    Hip external rotation    Knee flexion    Knee extension    Ankle dorsiflexion    Ankle plantarflexion    Ankle inversion    Ankle eversion    (Blank rows = not tested)  BED MOBILITY:  Sit to supine Complete Independence Supine to sit Complete Independence Rolling to Left CGA  TRANSFERS: Assistive device utilized: None  Sit to stand: Complete Independence Stand to sit: Complete Independence Chair to chair: Complete Independence Floor: not tested   GAIT: Gait pattern: WFL Distance walked: 50 ft in clinic Assistive device utilized: None Level of assistance: Complete  Independence Comments: no siginficant gait deviation noted  FUNCTIONAL TESTS:  5 times sit to stand: next visit SLS next visit  PATIENT SURVEYS:  DHI: THE DIZZINESS HANDICAP INVENTORY (DHI) next visit    DHI Scoring Instructions  The patient is asked to answer each question as it pertains to dizziness or unsteadiness problems, specifically  considering their condition during the last month. Questions are designed to incorporate functional (F), physical  (P), and emotional (E) impacts on disability.   Scores greater than 10 points should be referred to balance specialists for further evaluation.   16-34 Points (mild handicap)  36-52 Points (moderate handicap)  54+ Points (severe handicap)  Minimally Detectable Change: 17 points ([address] [place], 1990)  [place], G. SHAUNNA. and [place], C. W. (1990). The development of the Dizziness Handicap Inventory. Archives of Otolaryngology - Head and Neck Surgery 116(4): [id number].   VESTIBULAR ASSESSMENT:  GENERAL OBSERVATION: glasses  SYMPTOM BEHAVIOR:  Subjective history: see above  Non-Vestibular symptoms: neck pain  Type of dizziness: Spinning/Vertigo  Frequency: every few months or so  Duration: varies  Aggravating factors: Induced by position change: lying supine  Relieving factors: head stationary, closing eyes, rest, and slow movements  Progression of symptoms: better  OCULOMOTOR EXAM:  Ocular Alignment: normal  Ocular ROM: No Limitations  Spontaneous Nystagmus: absent  Gaze-Induced Nystagmus: absent  Smooth Pursuits: intact  Saccades: not tested  Convergence/Divergence: 5 inc  Cover-cross-cover test: not tested   VESTIBULAR - OCULAR REFLEX: not tested at eval  Slow VOR:   VOR Cancellation:   Head-Impulse Test:   Dynamic Visual Acuity:    POSITIONAL TESTING: Right Dix-Hallpike: upbeating, right nystagmus and Duration: very brief only a couple sec Left Dix-Hallpike: no nystagmus Other:    MOTION  SENSITIVITY:  Motion Sensitivity Quotient Intensity: 0 = none, 1 = Lightheaded, 2 = Mild, 3 = Moderate, 4 = Severe, 5 = Vomiting  Intensity  1. Sitting to supine   2. Supine to L side   3. Supine to R side   4. Supine to sitting   5. L Hallpike-Dix 0  6. Up from L  0  7. R Hallpike-Dix 2  8. Up from R  3  9. Sitting, head tipped to L knee   10. Head up from L knee   11. Sitting, head tipped to R knee   12. Head up from R knee   13. Sitting head turns x5   14.Sitting head nods x5   15. In stance, 180 turn to L    16. In stance, 180 turn to R     OTHOSTATICS: not done                                                                                                                            TREATMENT DATE: 06/03/2024 physical therapy evaluation; Epley right side; education in BPPV   Canalith Repositioning:  Epley Right: Number of Reps: 1 and Response to Treatment: comment: dizzy in first and last position Gaze Adaptation:   Habituation:   Other:   PATIENT EDUCATION: Education details: Patient educated on exam findings, POC, scope of PT, HEP, and education on BPPV. Person educated: Patient Education method: Explanation, Demonstration, and Handouts Education comprehension: verbalized understanding, returned demonstration, verbal cues required, and tactile cues required   HOME EXERCISE PROGRAM: Access Code: [email or web address] URL: [email or web address] Date: 06/03/2024 Prepared by: [facility] - Rehab  Patient Education - Right Semont - Right Epley Maneuver - BPPV - What Is BPPV? - BPPV - BPPV - After BPPV Repositioning GOALS: Goals reviewed with patient? No  SHORT TERM GOALS: Target date: 06/24/2024  Patient will report 50% improvement overall  Baseline: Goal status: INITIAL   LONG TERM GOALS: Target date: 07/15/2024  Patient will report 80% improvement overall  Baseline:  Goal status: INITIAL  2.  Patient will have 2 full weeks without any dizziness  episodes Baseline:  Goal status: INITIAL  3.  Patient will have a negative dix hallpike on right  Baseline:  Goal status: INITIAL   ASSESSMENT:  CLINICAL IMPRESSION: Patient is a 78 y.o. female who was seen today for physical therapy evaluation and treatment for R42 (ICD-10-CM) - Vertigo. Patient demonstrates increased vestibular symptoms with provocative testing which is negatively impacting patient ability to perform ADLs and functional mobility tasks. Patient will benefit from skilled physical therapy services to address these deficits to improve level of function with ADLs, functional mobility tasks, and reduce risk for falls.    OBJECTIVE IMPAIRMENTS: decreased activity tolerance and dizziness.   ACTIVITY LIMITATIONS: sleeping and bed mobility  PARTICIPATION LIMITATIONS: cleaning, laundry, shopping, and community activity  REHAB POTENTIAL: Good  CLINICAL DECISION MAKING: Evolving/moderate complexity  EVALUATION COMPLEXITY: Moderate   PLAN:  PT FREQUENCY: 1x/week  PT DURATION: 6 weeks  PLANNED INTERVENTIONS: 97164- PT Re-evaluation, 97110-Therapeutic exercises, 97530- Therapeutic activity, 97112- Neuromuscular re-education, 97535- Self Care, 02859- Manual therapy, [id number]- Gait training, [phone]- Orthotic Fit/training, [phone]- Canalith repositioning, [id number]- Aquatic Therapy, 97760- Splinting, [id number]- Wound care (first 20 sq cm), 97598- Wound care (each additional 20 sq cm)Patient/Family education, Balance training, Stair training, Taping, Dry Needling, Joint mobilization, Joint manipulation, Spinal manipulation, Spinal mobilization, Scar mobilization, and DME instructions.   PLAN FOR NEXT SESSION: DHI next visit; re test right side posterior canal Dix Hallpike; test saccades and VOR; limited eval time due to late arrival   10:50 AM, 06/03/24 Atlas Crossland Small Lamonte Hartt MPT [facility] [place] [phone] Ph:[phone]

## 2024-06-03 ENCOUNTER — Ambulatory Visit (HOSPITAL_COMMUNITY): Attending: Internal Medicine

## 2024-06-03 ENCOUNTER — Other Ambulatory Visit: Payer: Self-pay

## 2024-06-03 DIAGNOSIS — R42 Dizziness and giddiness: Secondary | ICD-10-CM | POA: Diagnosis present

## 2024-06-04 ENCOUNTER — Ambulatory Visit (INDEPENDENT_AMBULATORY_CARE_PROVIDER_SITE_OTHER)

## 2024-06-04 ENCOUNTER — Ambulatory Visit (HOSPITAL_COMMUNITY)

## 2024-06-04 VITALS — BP 151/73 | HR 75 | Temp 89.0°F | Wt 185.0 lb

## 2024-06-04 DIAGNOSIS — J3489 Other specified disorders of nose and nasal sinuses: Secondary | ICD-10-CM | POA: Diagnosis not present

## 2024-06-04 DIAGNOSIS — K118 Other diseases of salivary glands: Secondary | ICD-10-CM | POA: Diagnosis not present

## 2024-06-04 DIAGNOSIS — J309 Allergic rhinitis, unspecified: Secondary | ICD-10-CM | POA: Diagnosis not present

## 2024-06-04 DIAGNOSIS — H9313 Tinnitus, bilateral: Secondary | ICD-10-CM | POA: Diagnosis not present

## 2024-06-04 DIAGNOSIS — H9193 Unspecified hearing loss, bilateral: Secondary | ICD-10-CM

## 2024-06-04 DIAGNOSIS — J342 Deviated nasal septum: Secondary | ICD-10-CM

## 2024-06-04 NOTE — Progress Notes (Unsigned)
 HPI:   Debra Atkinson is a 78 y.o. female who presents in follow-up regarding left submandibular mass. She denies any pain or changes to the mass. Denies any difficulty swallowing. She did have imaging and a biopsy performed - which revealed a benign lesion. Patient states that she is in need of a hearing test as she feels her hearing has declined. She denies any recent audiology evaluation.    PMH/Meds/All/SocHx/FamHx/ROS: Past Medical History:  Diagnosis Date   Anxiety    Cancer (HCC) 07/2023   right breast IDC   Cerebrovascular disease 02/05/2019   Concussion '06, '11   Family history of colon cancer 02/28/2016   Fibromyalgia 10/08/2013   Hyperlipidemia    diet managed   Macular degeneration disease    Pre-diabetes    Varicella    Vertigo    Past Surgical History:  Procedure Laterality Date   APPENDECTOMY  07/02/1977   BREAST LUMPECTOMY WITH RADIOACTIVE SEED LOCALIZATION Right 08/14/2023   Procedure: RIGHT BREAST SEED GUIDED LUMPECTOMY;  Surgeon: Ebbie Cough, MD;  Location: [facility];  Service: General;  Laterality: Right;   CESAREAN SECTION  07/02/1976   CHOLECYSTECTOMY  07/02/1977   laparotomy   COLONOSCOPY     No family history of bleeding disorders, wound healing problems or difficulty with anesthesia.  Social Connections: Socially Integrated (01/30/2024)   Social Connection and Isolation Panel    Frequency of Communication with Friends and Family: More than three times a week    Frequency of Social Gatherings with Friends and Family: More than three times a week    Attends Religious Services: More than 4 times per year    Active Member of [employer] or Organizations: Yes    Attends Engineer, Structural: More than 4 times per year    Marital Status: Married    Current Outpatient Medications:    anastrozole  (ARIMIDEX ) 1 MG tablet, Take 1 tablet (1 mg total) by mouth daily., Disp: 90 tablet, Rfl: 3   cetirizine  (ZYRTEC ) 10 MG tablet, Take  10 mg by mouth daily., Disp: , Rfl:    cyclobenzaprine  (FLEXERIL ) 5 MG tablet, Take 5 mg by mouth 3 (three) times daily as needed., Disp: , Rfl:    meclizine  (ANTIVERT ) 12.5 MG tablet, Take 1 tablet (12.5 mg total) by mouth 3 (three) times daily as needed., Disp: 40 tablet, Rfl: 1   Multiple Vitamins-Minerals (OCUVITE-LUTEIN PO), , Disp: , Rfl:    rosuvastatin  (CRESTOR ) 40 MG tablet, Take 1 tablet by mouth once daily, Disp: 90 tablet, Rfl: 0   azelastine  (ASTELIN ) 0.1 % nasal spray, Place 2 sprays into both nostrils 2 (two) times daily. Use in each nostril as directed (Patient not taking: Reported on 06/04/2024), Disp: 30 mL, Rfl: 12   Cholecalciferol ([employer] D3) 50 MCG (2000 UT) CAPS, Take by mouth.  (Patient not taking: Reported on 06/04/2024), Disp: , Rfl:    predniSONE  (DELTASONE ) 10 MG tablet, Take 1 tablet (10 mg total) by mouth daily with breakfast. Take 5 tablets per day for 4 days, take 4 tablets per day for 4 days, take 3 tablets per day for 4 days, take 2 tablets per day for 4 days, take 1 tablet per day for 4 days (Patient not taking: Reported on 06/04/2024), Disp: 60 tablet, Rfl: 0   triamcinolone  (NASACORT ) 55 MCG/ACT AERO nasal inhaler, Place 2 sprays into the nose daily. (Patient not taking: Reported on 06/04/2024), Disp: 1 each, Rfl: 2 A complete ROS was performed with pertinent positives/negatives  noted in the HPI. The remainder of the ROS are negative.   Physical Exam:  BP (!) 151/73 (BP Location: Right Arm, Patient Position: Sitting, Cuff Size: Normal) Comment: white coat  Pulse 75   Temp (!) 89 F (31.7 C)   Wt 185 lb (83.9 kg)   SpO2 96%   BMI 33.84 kg/m  General: Well developed, well nourished. No acute distress.  Head/Face: Normocephalic. No sinus tenderness. Facial nerve intact and equal bilaterally. No facial lacerations. Eyes: PERRL, no scleral icterus or conjunctival hemorrhage. EOMI. Ears: No gross deformity. Normal external canal. Hearing: Normal speech reception.   Nose: No gross deformity or lesions. No purulent discharge. Bilateral inferior turbinate hypertrophy. Rightward septal deviation Mouth/Oropharynx: Lips without any lesions. No mucosal lesions within the oropharynx. No tonsillar enlargement, exudate, or lesions. Pharyngeal walls symmetrical. Uvula midline. Tongue midline without lesions. Larynx: See TFL if applicable Nasopharynx: See TFL if applicable Neck: Trachea midline. No masses. No thyromegaly or nodules palpated. No crepitus. Left submandibular gland with area of firm 1cm ovoid mass. Good salivary flow from bilateral Wharton's ducts.  Lymphatic: No lymphadenopathy in the neck. Respiratory: No stridor or distress. Room air. Cardiovascular: Regular rate and rhythm. Extremities: No edema or cyanosis. Warm and well-perfused. Skin: No scars or lesions on face or neck. Neurologic: CN II-XII grossly intact. Moving all extremities without gross abnormality. Other:  Independent Review of Additional Tests or Records: CT neck 04/06/2024 - left submandibular mass approximately 1 cm, no cervical lymphadenopathy noted  Pathology 05/25/2024 - fine needle aspiration of left submandibular gland mass negative for malignancy Procedures: None Impression & Plans: Debra Atkinson is a 78 y.o. female with left submandibular gland mass presenting for follow-up.  Submandibular gland mass, left - stable from previous exam - CT imaging discussed - Pathology reviewed - negative for malignancy   Allergic rhinitis - improved Nasal congestion likely due to allergic rhinitis, with positional nasal drip - Continue nasacort  nasal spray for daily use in the morning. - Continue azelastine  nasal spray for use in the morning and at night if symptoms persist. - nasal obstruction may be exacerbated by rightward septal deviation   Decreased hearing, bilateral tinnitus - Audiogram ordered   Follow-up after audiogram.  [name], DO [facility]

## 2024-06-06 ENCOUNTER — Other Ambulatory Visit: Payer: Self-pay | Admitting: Internal Medicine

## 2024-06-19 ENCOUNTER — Encounter (HOSPITAL_COMMUNITY): Payer: Self-pay

## 2024-06-19 ENCOUNTER — Ambulatory Visit (HOSPITAL_COMMUNITY)

## 2024-06-19 DIAGNOSIS — R42 Dizziness and giddiness: Secondary | ICD-10-CM

## 2024-06-19 NOTE — Therapy (Signed)
 " OUTPATIENT PHYSICAL THERAPY VESTIBULAR TREATMENT      Patient Name: Debra Atkinson MRN: [id number] DOB:1945/11/17, 78 y.o., female Today's Date: 06/19/2024  END OF SESSION:  PT End of Session - 06/19/24 1509     Visit Number 2    Number of Visits 4    Date for Recertification  07/15/24    Authorization Type [employer]    Authorization Time Period no authorization required    PT Start Time 1510   Patient arrived late to her appointment.   PT Stop Time 1546    PT Time Calculation (min) 36 min    Activity Tolerance Patient tolerated treatment well    Behavior During Therapy WFL for tasks assessed/performed           Past Medical History:  Diagnosis Date   Anxiety    Cancer (HCC) 07/2023   right breast IDC   Cerebrovascular disease 02/05/2019   Concussion '06, '11   Family history of colon cancer 02/28/2016   Fibromyalgia 10/08/2013   Hyperlipidemia    diet managed   Macular degeneration disease    Pre-diabetes    Varicella    Vertigo    Past Surgical History:  Procedure Laterality Date   APPENDECTOMY  07/02/1977   BREAST LUMPECTOMY WITH RADIOACTIVE SEED LOCALIZATION Right 08/14/2023   Procedure: RIGHT BREAST SEED GUIDED LUMPECTOMY;  Surgeon: Ebbie Cough, MD;  Location: [facility];  Service: General;  Laterality: Right;   CESAREAN SECTION  07/02/1976   CHOLECYSTECTOMY  07/02/1977   laparotomy   COLONOSCOPY     Patient Active Problem List   Diagnosis Date Noted   Bilateral tinnitus 03/19/2024   Neck mass 03/19/2024   Palpitations 07/24/2023   Malignant neoplasm of upper-outer quadrant of right breast in female, estrogen receptor positive (HCC) 07/18/2023   Tendonitis 07/21/2022   Right leg pain 07/04/2022   Right foot pain 07/04/2022   Nosebleed 05/07/2022   Multiple contusions 01/16/2022   Chest pain 08/06/2021   Finger pain, right 08/06/2021   Acute pain of left knee 12/07/2020   Hematoma 12/07/2020   Abrasion of right arm  12/07/2020   Urinary incontinence 11/08/2020   Dyspnea 11/08/2020   URI (upper respiratory infection) 11/08/2020   Pilonidal cyst 08/06/2020   Macular degeneration, dry 08/05/2020   COVID-19 virus infection 07/15/2020   Chronic right-sided headache 11/28/2019   Acute hearing loss, left 11/28/2019   Anxiety 09/27/2019   Facial cellulitis 09/25/2019   Abdominal pain, right lower quadrant 09/07/2019   OAB (overactive bladder) 08/06/2019   Right sided sciatica 08/06/2019   Pain of left thumb 08/06/2019   Cerebrovascular disease 02/05/2019   Rash and nonspecific skin eruption 01/19/2019   Right knee pain 09/01/2018   Great toe pain, right 09/01/2018   Acute pharyngitis 02/24/2018   Splinter in skin 01/22/2018   Right serous otitis media 11/28/2017   Vertigo 11/28/2017   Bilateral leg pain 11/28/2017   Dysuria 07/06/2017   B12 deficiency 05/07/2017   Vitamin D  deficiency 05/07/2017   Fatigue 02/13/2017   Lateral epicondylitis of left elbow 10/31/2016   Toe pain, left 10/31/2016   Posterior neck pain 10/31/2016   Degenerative arthritis of knee, bilateral 07/30/2016   Thyroid  nodule 06/27/2016   Pain in both knees 06/27/2016   Mass of right side of neck 04/29/2016   Dizziness 03/07/2016   Family history of colon cancer 02/28/2016   Constipation 02/28/2016   UTI (urinary tract infection) 07/29/2015   Cough 05/05/2015  BPV (benign positional vertigo) 11/09/2014   Atypical chest pain 10/08/2014   Upper airway cough syndrome 06/08/2014   Sinusitis, chronic 04/09/2014   Allergic conjunctivitis 04/09/2014   Urinary frequency 02/18/2014   Eustachian tube dysfunction 12/04/2013   Fibromyalgia 10/08/2013   Low back pain 09/18/2012   Allergic rhinitis 09/18/2012   Need for prophylactic vaccination and inoculation against influenza 04/28/2012   Encounter for well adult exam with abnormal findings 04/28/2012   Pre-diabetes 04/25/2011   Hyperlipidemia     PCP: Norleen Lynwood ORN,  MD REFERRING PROVIDER: Norleen Lynwood ORN, MD  REFERRING DIAG: R42 (ICD-10-CM) - Vertigo  THERAPY DIAG:  Dizziness and giddiness  ONSET DATE: 10 or 15 years  Rationale for Evaluation and Treatment: Rehabilitation  SUBJECTIVE:   SUBJECTIVE STATEMENT: Patient reports that her symptoms are around 3/10.   Eval: 15 minutes late for eval; had a bad falls in 2015 and 2011; both times hit her head and had concussion.  Was bending over afterward to wash a car and started with vertigo.  Has had several bouts through the years.  Meclizine  did not help; Valium  helped some but she does not want to take it.    Pt accompanied by: self  PERTINENT HISTORY: anxiety; arthritis in neck; macular degeneration  PAIN:  Are you having pain? No  PRECAUTIONS: Fall  RED FLAGS: None   WEIGHT BEARING RESTRICTIONS: No  FALLS: Has patient fallen in last 6 months? No   PLOF: Independent  PATIENT GOALS: to not get dizzy  OBJECTIVE:  Note: Objective measures were completed at Evaluation unless otherwise noted.  DIAGNOSTIC FINDINGS:   COGNITION: Overall cognitive status: Within functional limits for tasks assessed   SENSATION: WFL  EDEMA:  None noted  POSTURE:  rounded shoulders and forward head  Cervical ROM:  grossly wfl throughout  Active AROM (deg) eval  Flexion   Extension   Right lateral flexion   Left lateral flexion   Right rotation   Left rotation   (Blank rows = not tested)  STRENGTH: not tested  LOWER EXTREMITY MMT:   MMT Right eval Left eval  Hip flexion    Hip abduction    Hip adduction    Hip internal rotation    Hip external rotation    Knee flexion    Knee extension    Ankle dorsiflexion    Ankle plantarflexion    Ankle inversion    Ankle eversion    (Blank rows = not tested)  BED MOBILITY:  Sit to supine Complete Independence Supine to sit Complete Independence Rolling to Left CGA  TRANSFERS: Assistive device utilized: None  Sit to stand:  Complete Independence Stand to sit: Complete Independence Chair to chair: Complete Independence Floor: not tested   GAIT: Gait pattern: WFL Distance walked: 50 ft in clinic Assistive device utilized: None Level of assistance: Complete Independence Comments: no siginficant gait deviation noted  FUNCTIONAL TESTS:  5 times sit to stand: next visit SLS next visit  PATIENT SURVEYS:  DHI: THE DIZZINESS HANDICAP INVENTORY (DHI) next visit    DHI Scoring Instructions  The patient is asked to answer each question as it pertains to dizziness or unsteadiness problems, specifically  considering their condition during the last month. Questions are designed to incorporate functional (F), physical  (P), and emotional (E) impacts on disability.   Scores greater than 10 points should be referred to balance specialists for further evaluation.   16-34 Points (mild handicap)  36-52 Points (moderate handicap)  54+ Points (  severe handicap)  Minimally Detectable Change: 17 points [facility] [place], 1990)  [place], G. SHAUNNA. and [place], C. W. (1990). The development of the Dizziness Handicap Inventory. Archives of Otolaryngology - Head and Neck Surgery 116(4): [id number].   VESTIBULAR ASSESSMENT:  GENERAL OBSERVATION: glasses   SYMPTOM BEHAVIOR:  Subjective history: see above  Non-Vestibular symptoms: neck pain  Type of dizziness: Spinning/Vertigo  Frequency: every few months or so  Duration: varies  Aggravating factors: Induced by position change: lying supine  Relieving factors: head stationary, closing eyes, rest, and slow movements  Progression of symptoms: better  OCULOMOTOR EXAM:  Ocular Alignment: normal  Ocular ROM: No Limitations  Spontaneous Nystagmus: absent  Gaze-Induced Nystagmus: absent  Smooth Pursuits: intact  Saccades: not tested  Convergence/Divergence: 5 inc  Cover-cross-cover test: not tested   VESTIBULAR - OCULAR REFLEX: not tested at eval  Slow VOR:   VOR  Cancellation:   Head-Impulse Test:   Dynamic Visual Acuity:    POSITIONAL TESTING: Right Dix-Hallpike: upbeating, right nystagmus and Duration: very brief only a couple sec Left Dix-Hallpike: no nystagmus Other:    MOTION SENSITIVITY:  Motion Sensitivity Quotient Intensity: 0 = none, 1 = Lightheaded, 2 = Mild, 3 = Moderate, 4 = Severe, 5 = Vomiting  Intensity  1. Sitting to supine   2. Supine to L side   3. Supine to R side   4. Supine to sitting   5. L Hallpike-Dix 0  6. Up from L  0  7. R Hallpike-Dix 2  8. Up from R  3  9. Sitting, head tipped to L knee   10. Head up from L knee   11. Sitting, head tipped to R knee   12. Head up from R knee   13. Sitting head turns x5   14.Sitting head nods x5   15. In stance, 180 turn to L    16. In stance, 180 turn to R     OTHOSTATICS: not done                                                                                                                            TREATMENT DATE:                                    06/19/24 EXERCISE LOG  Exercise Repetitions and Resistance Comments  Dix-Hallpike Left: Positive Right: Negative   Patient education See below                Blank cell = exercise not performed today   06/03/2024 physical therapy evaluation; Epley right side; education in BPPV   Canalith Repositioning:  Epley Right: Number of Reps: 1 and Response to Treatment: comment: dizzy in first and last position Gaze Adaptation:   Habituation:   Other:   PATIENT EDUCATION: Education details: Patient educated on exam findings,scope of PT, anatomy, Epley maneuver,  and education on BPPV. Person educated: Patient Education method: Explanation Education comprehension: verbalized understanding   HOME EXERCISE PROGRAM: Access Code: [email or web address] URL: [email or web address] Date: 06/03/2024 Prepared by: AP - Rehab  Patient Education - Right Semont - Right Epley Maneuver - BPPV - What Is BPPV? - BPPV -  BPPV - After BPPV Repositioning GOALS: Goals reviewed with patient? No  SHORT TERM GOALS: Target date: 06/24/2024  Patient will report 50% improvement overall  Baseline: Goal status: INITIAL   LONG TERM GOALS: Target date: 07/15/2024  Patient will report 80% improvement overall  Baseline:  Goal status: INITIAL  2.  Patient will have 2 full weeks without any dizziness episodes Baseline:  Goal status: INITIAL  3.  Patient will have a negative dix hallpike on right  Baseline:  Goal status: INITIAL   ASSESSMENT:  CLINICAL IMPRESSION: Patient presented to treatment with increased dizziness.  Her Dix-Hallpike positional assessments were repeated. She exhibited no signs of of nystagmus when testing the right side.  However, her symptoms were significantly reproduced when testing in the left.  She was unable to tolerate the Epley maneuver at this time due to the severity of her symptoms.  She was educated on her symptoms and how the Epley maneuver is utilized to treat BPPV.  She reported understanding.  She reported mild dizziness upon the conclusion of treatment.  Recommend that she continue with skilled physical therapy to address her impairments to return to her prior level of function.  Eval: Patient is a 78 y.o. female who was seen today for physical therapy evaluation and treatment for R42 (ICD-10-CM) - Vertigo. Patient demonstrates increased vestibular symptoms with provocative testing which is negatively impacting patient ability to perform ADLs and functional mobility tasks. Patient will benefit from skilled physical therapy services to address these deficits to improve level of function with ADLs, functional mobility tasks, and reduce risk for falls.    OBJECTIVE IMPAIRMENTS: decreased activity tolerance and dizziness.   ACTIVITY LIMITATIONS: sleeping and bed mobility  PARTICIPATION LIMITATIONS: cleaning, laundry, shopping, and community activity  REHAB POTENTIAL:  Good  CLINICAL DECISION MAKING: Evolving/moderate complexity  EVALUATION COMPLEXITY: Moderate   PLAN:  PT FREQUENCY: 1x/week  PT DURATION: 6 weeks  PLANNED INTERVENTIONS: 97164- PT Re-evaluation, 97110-Therapeutic exercises, 97530- Therapeutic activity, 97112- Neuromuscular re-education, 97535- Self Care, 02859- Manual therapy, [id number]- Gait training, [phone]- Orthotic Fit/training, [phone]- Canalith repositioning, [id number]- Aquatic Therapy, 97760- Splinting, [id number]- Wound care (first 20 sq cm), 97598- Wound care (each additional 20 sq cm)Patient/Family education, Balance training, Stair training, Taping, Dry Needling, Joint mobilization, Joint manipulation, Spinal manipulation, Spinal mobilization, Scar mobilization, and DME instructions.   PLAN FOR NEXT SESSION: DHI next visit; re test right side posterior canal Dix Hallpike; test saccades and VOR; limited eval time due to late arrival  Lacinda Fass, [place], DPT  5:27 PM, 06/19/2024  "

## 2024-07-03 ENCOUNTER — Encounter: Payer: Self-pay | Admitting: *Deleted

## 2024-07-03 NOTE — Progress Notes (Signed)
 Debra Atkinson                                          MRN: [id number]   07/03/2024   The [facility] Quality Team Specialist reviewed this patient medical record for the purposes of chart review for care gap closure. The following were reviewed: chart review for care gap closure-controlling blood pressure.    [facility] Quality Team

## 2024-07-09 ENCOUNTER — Encounter: Payer: Self-pay | Admitting: Hematology

## 2024-07-27 ENCOUNTER — Ambulatory Visit: Admitting: Internal Medicine

## 2024-07-31 ENCOUNTER — Ambulatory Visit: Payer: Self-pay | Admitting: *Deleted

## 2024-07-31 NOTE — Telephone Encounter (Signed)
 FYI Only or Action Required?: FYI only for provider: ED advised and patient refused only wants to see Dr. Norleen.  Patient was last seen in primary care on 03/19/2024 by Norleen Lynwood ORN, MD.  Called Nurse Triage reporting Cough.  Symptoms began yesterday.  Interventions attempted: Rest, hydration, or home remedies.  Symptoms are: unchanged.  Triage Disposition: Go to ED Now (Notify PCP)  Patient/caregiver understands and will follow disposition?: No, wishes to speak with PCP   Patient reports chest heaviness and tightness even at rest constant since yesterday . Reports cough not severe and feels from post nasal issues , runny nose.  Patient declined ED evaluation. Please advise patient only wants to see Dr. Norleen before he leaves. Requesting call back.  Attempted to contact CAL to notify refused disposition.             Reason for Disposition  Chest pain  (Exception: MILD central chest pain, present only when coughing.)  Answer Assessment - Initial Assessment Questions Recommended ED due to chest heaviness and tightness . Patient refused and reports she is not having chest pain, only heaviness and tightness. Denies chest discomfort from coughing. Reports discomfort center of chest upper area. Recommended UC if she would not go to ED. Unsure of disposition . Patient reports she only wants to see Dr. Norleen. Please advise.       1. ONSET: When did the cough begin?      Last week  2. SEVERITY: How bad is the cough today?      Worse coughing laying down 3. SPUTUM: Describe the color of your sputum (e.g., none, dry cough; clear, white, yellow, green)     clear 4. HEMOPTYSIS: Are you coughing up any blood? If Yes, ask: How much? (e.g., flecks, streaks, tablespoons, etc.)     No  5. DIFFICULTY BREATHING: Are you having difficulty breathing? If Yes, ask: How bad is it? (e.g., mild, moderate, severe)      None  6. FEVER: Do you have a fever? If Yes, ask: What is  your temperature, how was it measured, and when did it start?     No  7. CARDIAC HISTORY: Do you have any history of heart disease? (e.g., heart attack, congestive heart failure)      Na  8. LUNG HISTORY: Do you have any history of lung disease?  (e.g., pulmonary embolus, asthma, emphysema)     Na  9. PE RISK FACTORS: Do you have a history of blood clots? (or: recent major surgery, recent prolonged travel, bedridden)     Na  10. OTHER SYMPTOMS: Do you have any other symptoms? (e.g., runny nose, wheezing, chest pain)        Headaches , runny nose, post nasal drip, chest heaviness and tightness x 1 day constant. Denies chest pain. Reports she was unable to lay down last night has to sit up.  Denies SOB or difficulty breathing. No fever. Husband was sick earlier this week , now she is sick.  Protocols used: Cough - Acute Productive-A-AH

## 2024-08-04 ENCOUNTER — Ambulatory Visit: Admitting: Family Medicine

## 2024-08-04 ENCOUNTER — Encounter: Payer: Self-pay | Admitting: Family Medicine

## 2024-08-04 VITALS — BP 136/82 | HR 86 | Temp 98.2°F | Ht 62.0 in | Wt 191.2 lb

## 2024-08-04 DIAGNOSIS — R0982 Postnasal drip: Secondary | ICD-10-CM

## 2024-08-04 DIAGNOSIS — R42 Dizziness and giddiness: Secondary | ICD-10-CM

## 2024-08-04 DIAGNOSIS — R051 Acute cough: Secondary | ICD-10-CM

## 2024-08-04 LAB — POCT INFLUENZA A/B
Influenza A, POC: NEGATIVE
Influenza B, POC: NEGATIVE

## 2024-08-04 LAB — POC COVID19 BINAXNOW: SARS Coronavirus 2 Ag: NEGATIVE

## 2024-08-04 MED ORDER — FLUTICASONE PROPIONATE 50 MCG/ACT NA SUSP: 2.0000 | Freq: Every day | NASAL | 6 refills | Status: AC

## 2024-08-04 NOTE — Progress Notes (Unsigned)
 "  Acute Office Visit  Subjective:     Patient ID: Debra Atkinson, female    DOB: Apr 14, 1946, 79 y.o.   MRN: [id number]  Chief Complaint  Patient presents with   Cough    Pt reports coughing and sneezing x 6 days. Shortness of breath and reports stuffy nose. Pt also reports heavy feeling in chest. Denies fevers or chills.     HPI  Discussed the use of AI scribe software for clinical note transcription with the patient, who gave verbal consent to proceed.  History of Present Illness Debra Atkinson is a 79 year old female who presents with coughing, sneezing, and nasal bleeding.  Upper respiratory symptoms - Cough and sneezing with clear nasal discharge - Epistaxis occurring when blowing or wiping nose - Sore throat present - Heaviness in chest - Concern due to history of bronchial pneumonia  Cardiorespiratory symptoms - Intermittent palpitations, sometimes associated with anxiety or caffeine intake - Shortness of breath when walking on property or with severe nasal congestion - No chest pain  Vestibular symptoms - Chronic vertigo since head injury in 2015 - Dizziness when lying down, turning, or standing - Requires holding onto furniture to prevent falls - Prior vertigo therapy worsened symptoms  Functional status - Remains very active outdoors - Prefers not to stay indoors     ROS Per HPI      Objective:    BP 136/82   Pulse 86   Temp 98.2 F (36.8 C) (Temporal)   Ht 5' 2 (1.575 m)   Wt 191 lb 3.2 oz (86.7 kg)   SpO2 96%   BMI 34.97 kg/m    Physical Exam Vitals and nursing note reviewed.  Constitutional:      General: She is not in acute distress.    Appearance: Normal appearance. She is normal weight.  HENT:     Head: Normocephalic and atraumatic.     Right Ear: External ear normal.     Left Ear: External ear normal.     Nose: Nose normal.     Mouth/Throat:     Mouth: Mucous membranes are moist.     Pharynx: Oropharynx is clear.  Eyes:      Extraocular Movements: Extraocular movements intact.     Pupils: Pupils are equal, round, and reactive to light.  Cardiovascular:     Rate and Rhythm: Normal rate and regular rhythm.     Pulses: Normal pulses.     Heart sounds: Normal heart sounds.  Pulmonary:     Effort: Pulmonary effort is normal. No respiratory distress.     Breath sounds: Normal breath sounds. No wheezing, rhonchi or rales.  Musculoskeletal:        General: Normal range of motion.     Cervical back: Normal range of motion.     Right lower leg: No edema.     Left lower leg: No edema.  Lymphadenopathy:     Cervical: No cervical adenopathy.  Neurological:     General: No focal deficit present.     Mental Status: She is alert and oriented to person, place, and time.  Psychiatric:        Mood and Affect: Mood normal.        Thought Content: Thought content normal.     No results found for any visits on 08/04/24.      Assessment & Plan:   Assessment and Plan Assessment & Plan Acute upper respiratory tract infection with post-nasal drip Symptoms consistent  with viral infection, exacerbated by dry weather and indoor heating. No bacterial infection or pneumonia. - Recommended saline nasal gel for nasal dryness and epistaxis. - Advised use of a humidifier with distilled water. - Prescribed Flonase  for nasal congestion. - Advised against antibiotics.  Vertigo Chronic vertigo exacerbated by recent therapy attempts. No acute intervention planned due to previous adverse reactions. - Address nasal congestion to potentially alleviate vertigo symptoms.     Orders Placed This Encounter  Procedures   POCT Influenza A/B   POC COVID-19 BinaxNow     Meds ordered this encounter  Medications   fluticasone  (FLONASE ) 50 MCG/ACT nasal spray    Sig: Place 2 sprays into both nostrils daily.    Dispense:  16 g    Refill:  6    No follow-ups on file.  Corean LITTIE Ku, FNP  "

## 2024-08-10 ENCOUNTER — Inpatient Hospital Stay: Admitting: Nurse Practitioner

## 2024-08-10 ENCOUNTER — Inpatient Hospital Stay

## 2024-09-01 ENCOUNTER — Ambulatory Visit: Admitting: Family Medicine

## 2024-09-09 ENCOUNTER — Ambulatory Visit (INDEPENDENT_AMBULATORY_CARE_PROVIDER_SITE_OTHER): Admitting: Audiology

## 2024-09-09 ENCOUNTER — Ambulatory Visit (INDEPENDENT_AMBULATORY_CARE_PROVIDER_SITE_OTHER)

## 2025-01-07 ENCOUNTER — Encounter: Admitting: Family Medicine

## 2025-02-05 ENCOUNTER — Ambulatory Visit
